# Patient Record
Sex: Male | Born: 1935 | Race: Black or African American | Hispanic: No | Marital: Married | Smoking: Never smoker
Health system: Southern US, Community
[De-identification: ages and names within clinical notes are randomized; demographics above are authoritative.]

## PROBLEM LIST (undated history)

## (undated) DIAGNOSIS — K219 Gastro-esophageal reflux disease without esophagitis: Secondary | ICD-10-CM

## (undated) DIAGNOSIS — R0602 Shortness of breath: Secondary | ICD-10-CM

## (undated) DIAGNOSIS — N186 End stage renal disease: Secondary | ICD-10-CM

## (undated) DIAGNOSIS — J45909 Unspecified asthma, uncomplicated: Secondary | ICD-10-CM

## (undated) DIAGNOSIS — J111 Influenza due to unidentified influenza virus with other respiratory manifestations: Secondary | ICD-10-CM

## (undated) DIAGNOSIS — H9190 Unspecified hearing loss, unspecified ear: Secondary | ICD-10-CM

## (undated) DIAGNOSIS — D649 Anemia, unspecified: Secondary | ICD-10-CM

## (undated) DIAGNOSIS — I10 Essential (primary) hypertension: Secondary | ICD-10-CM

## (undated) DIAGNOSIS — Z9289 Personal history of other medical treatment: Secondary | ICD-10-CM

## (undated) DIAGNOSIS — E785 Hyperlipidemia, unspecified: Secondary | ICD-10-CM

## (undated) DIAGNOSIS — F0390 Unspecified dementia without behavioral disturbance: Secondary | ICD-10-CM

## (undated) DIAGNOSIS — I252 Old myocardial infarction: Secondary | ICD-10-CM

## (undated) HISTORY — PX: LAPAROSCOPIC GASTROTOMY W/ REPAIR OF ULCER: SUR772

## (undated) HISTORY — DX: Old myocardial infarction: I25.2

## (undated) HISTORY — DX: Personal history of other medical treatment: Z92.89

## (undated) HISTORY — PX: COLONOSCOPY: SHX174

## (undated) HISTORY — DX: Essential (primary) hypertension: I10

## (undated) HISTORY — DX: Hyperlipidemia, unspecified: E78.5

## (undated) HISTORY — DX: End stage renal disease: N18.6

## (undated) HISTORY — PX: TOE AMPUTATION: SHX809

## (undated) HISTORY — DX: Anemia, unspecified: D64.9

## (undated) HISTORY — PX: EYE SURGERY: SHX253

---

## 2001-02-23 ENCOUNTER — Encounter: Admission: RE | Admit: 2001-02-23 | Discharge: 2001-02-23 | Payer: Self-pay | Admitting: *Deleted

## 2002-07-22 ENCOUNTER — Encounter: Admission: RE | Admit: 2002-07-22 | Discharge: 2002-07-22 | Payer: Self-pay | Admitting: Nephrology

## 2002-07-22 ENCOUNTER — Encounter: Payer: Self-pay | Admitting: Nephrology

## 2002-12-04 ENCOUNTER — Encounter: Payer: Self-pay | Admitting: Gastroenterology

## 2003-01-09 ENCOUNTER — Encounter: Payer: Self-pay | Admitting: Gastroenterology

## 2003-08-22 ENCOUNTER — Encounter (HOSPITAL_COMMUNITY): Admission: RE | Admit: 2003-08-22 | Discharge: 2003-11-20 | Payer: Self-pay | Admitting: Nephrology

## 2003-11-18 ENCOUNTER — Encounter: Admission: RE | Admit: 2003-11-18 | Discharge: 2003-11-18 | Payer: Self-pay | Admitting: Internal Medicine

## 2003-11-27 ENCOUNTER — Encounter (HOSPITAL_COMMUNITY): Admission: RE | Admit: 2003-11-27 | Discharge: 2004-02-25 | Payer: Self-pay | Admitting: Nephrology

## 2004-04-01 ENCOUNTER — Encounter (HOSPITAL_COMMUNITY): Admission: RE | Admit: 2004-04-01 | Discharge: 2004-06-30 | Payer: Self-pay | Admitting: Nephrology

## 2004-07-29 ENCOUNTER — Encounter (HOSPITAL_COMMUNITY): Admission: RE | Admit: 2004-07-29 | Discharge: 2004-10-27 | Payer: Self-pay | Admitting: Nephrology

## 2004-10-28 ENCOUNTER — Encounter (HOSPITAL_COMMUNITY): Admission: RE | Admit: 2004-10-28 | Discharge: 2005-01-26 | Payer: Self-pay | Admitting: Nephrology

## 2005-02-17 ENCOUNTER — Encounter (HOSPITAL_COMMUNITY): Admission: RE | Admit: 2005-02-17 | Discharge: 2005-05-18 | Payer: Self-pay | Admitting: Nephrology

## 2005-05-24 ENCOUNTER — Encounter (HOSPITAL_COMMUNITY): Admission: RE | Admit: 2005-05-24 | Discharge: 2005-08-22 | Payer: Self-pay | Admitting: Nephrology

## 2005-08-23 ENCOUNTER — Encounter (HOSPITAL_COMMUNITY): Admission: RE | Admit: 2005-08-23 | Discharge: 2005-11-21 | Payer: Self-pay | Admitting: Nephrology

## 2005-12-12 ENCOUNTER — Encounter (HOSPITAL_COMMUNITY): Admission: RE | Admit: 2005-12-12 | Discharge: 2006-03-12 | Payer: Self-pay | Admitting: Nephrology

## 2006-04-03 ENCOUNTER — Encounter (HOSPITAL_COMMUNITY): Admission: RE | Admit: 2006-04-03 | Discharge: 2006-06-07 | Payer: Self-pay | Admitting: Nephrology

## 2006-07-04 ENCOUNTER — Encounter (HOSPITAL_COMMUNITY): Admission: RE | Admit: 2006-07-04 | Discharge: 2006-10-02 | Payer: Self-pay | Admitting: Nephrology

## 2006-07-26 ENCOUNTER — Encounter (HOSPITAL_COMMUNITY): Admission: RE | Admit: 2006-07-26 | Discharge: 2006-10-24 | Payer: Self-pay | Admitting: Internal Medicine

## 2006-09-19 HISTORY — PX: AV FISTULA PLACEMENT: SHX1204

## 2006-11-29 ENCOUNTER — Ambulatory Visit: Payer: Self-pay | Admitting: Vascular Surgery

## 2007-02-22 ENCOUNTER — Encounter (HOSPITAL_COMMUNITY): Admission: RE | Admit: 2007-02-22 | Discharge: 2007-05-23 | Payer: Self-pay | Admitting: Nephrology

## 2007-04-15 ENCOUNTER — Inpatient Hospital Stay (HOSPITAL_COMMUNITY): Admission: EM | Admit: 2007-04-15 | Discharge: 2007-04-21 | Payer: Self-pay | Admitting: Emergency Medicine

## 2007-04-16 ENCOUNTER — Encounter: Payer: Self-pay | Admitting: Internal Medicine

## 2007-04-18 ENCOUNTER — Ambulatory Visit: Payer: Self-pay | Admitting: Vascular Surgery

## 2007-04-20 ENCOUNTER — Ambulatory Visit: Payer: Self-pay | Admitting: Internal Medicine

## 2007-04-25 ENCOUNTER — Ambulatory Visit (HOSPITAL_COMMUNITY): Admission: RE | Admit: 2007-04-25 | Discharge: 2007-04-25 | Payer: Self-pay | Admitting: Vascular Surgery

## 2007-05-24 ENCOUNTER — Encounter (HOSPITAL_COMMUNITY): Admission: RE | Admit: 2007-05-24 | Discharge: 2007-07-27 | Payer: Self-pay | Admitting: Nephrology

## 2007-06-05 ENCOUNTER — Ambulatory Visit: Payer: Self-pay | Admitting: Vascular Surgery

## 2007-08-30 ENCOUNTER — Encounter (HOSPITAL_COMMUNITY): Admission: RE | Admit: 2007-08-30 | Discharge: 2007-11-28 | Payer: Self-pay | Admitting: Nephrology

## 2007-12-20 ENCOUNTER — Encounter (HOSPITAL_COMMUNITY): Admission: RE | Admit: 2007-12-20 | Discharge: 2008-03-19 | Payer: Self-pay | Admitting: Nephrology

## 2008-03-20 ENCOUNTER — Encounter (HOSPITAL_COMMUNITY): Admission: RE | Admit: 2008-03-20 | Discharge: 2008-06-18 | Payer: Self-pay | Admitting: Nephrology

## 2008-06-30 ENCOUNTER — Encounter (HOSPITAL_COMMUNITY): Admission: RE | Admit: 2008-06-30 | Discharge: 2008-09-18 | Payer: Self-pay | Admitting: Nephrology

## 2008-09-25 ENCOUNTER — Encounter (HOSPITAL_COMMUNITY): Admission: RE | Admit: 2008-09-25 | Discharge: 2008-12-24 | Payer: Self-pay | Admitting: Pediatric Dentistry

## 2008-10-27 ENCOUNTER — Encounter: Payer: Self-pay | Admitting: Gastroenterology

## 2008-11-20 ENCOUNTER — Encounter: Payer: Self-pay | Admitting: Gastroenterology

## 2008-11-21 ENCOUNTER — Telehealth: Payer: Self-pay | Admitting: Gastroenterology

## 2008-11-25 ENCOUNTER — Ambulatory Visit: Payer: Self-pay | Admitting: Gastroenterology

## 2008-11-25 DIAGNOSIS — E119 Type 2 diabetes mellitus without complications: Secondary | ICD-10-CM

## 2008-11-25 DIAGNOSIS — Z860100 Personal history of colon polyps, unspecified: Secondary | ICD-10-CM | POA: Insufficient documentation

## 2008-11-25 DIAGNOSIS — R195 Other fecal abnormalities: Secondary | ICD-10-CM

## 2008-11-25 DIAGNOSIS — D649 Anemia, unspecified: Secondary | ICD-10-CM | POA: Insufficient documentation

## 2008-12-03 ENCOUNTER — Encounter: Payer: Self-pay | Admitting: Gastroenterology

## 2008-12-03 ENCOUNTER — Ambulatory Visit: Payer: Self-pay | Admitting: Gastroenterology

## 2008-12-03 ENCOUNTER — Ambulatory Visit (HOSPITAL_COMMUNITY): Admission: RE | Admit: 2008-12-03 | Discharge: 2008-12-03 | Payer: Self-pay | Admitting: Gastroenterology

## 2008-12-04 ENCOUNTER — Encounter: Payer: Self-pay | Admitting: Gastroenterology

## 2008-12-08 ENCOUNTER — Ambulatory Visit: Payer: Self-pay | Admitting: Gastroenterology

## 2008-12-08 DIAGNOSIS — K648 Other hemorrhoids: Secondary | ICD-10-CM | POA: Insufficient documentation

## 2008-12-08 DIAGNOSIS — D131 Benign neoplasm of stomach: Secondary | ICD-10-CM | POA: Insufficient documentation

## 2008-12-09 ENCOUNTER — Encounter: Payer: Self-pay | Admitting: Gastroenterology

## 2008-12-09 ENCOUNTER — Ambulatory Visit: Payer: Self-pay | Admitting: Gastroenterology

## 2008-12-10 ENCOUNTER — Encounter: Payer: Self-pay | Admitting: Gastroenterology

## 2008-12-25 ENCOUNTER — Encounter (HOSPITAL_COMMUNITY): Admission: RE | Admit: 2008-12-25 | Discharge: 2009-03-25 | Payer: Self-pay | Admitting: Nephrology

## 2009-01-02 ENCOUNTER — Ambulatory Visit: Payer: Self-pay | Admitting: Gastroenterology

## 2009-01-07 ENCOUNTER — Encounter: Payer: Self-pay | Admitting: Gastroenterology

## 2009-01-07 ENCOUNTER — Ambulatory Visit: Payer: Self-pay | Admitting: Gastroenterology

## 2009-01-09 ENCOUNTER — Encounter: Payer: Self-pay | Admitting: Gastroenterology

## 2009-01-14 ENCOUNTER — Ambulatory Visit (HOSPITAL_COMMUNITY): Admission: RE | Admit: 2009-01-14 | Discharge: 2009-01-14 | Payer: Self-pay | Admitting: Gastroenterology

## 2009-02-02 ENCOUNTER — Telehealth: Payer: Self-pay | Admitting: Gastroenterology

## 2009-02-20 ENCOUNTER — Telehealth: Payer: Self-pay | Admitting: Gastroenterology

## 2009-05-27 ENCOUNTER — Emergency Department (HOSPITAL_COMMUNITY): Admission: EM | Admit: 2009-05-27 | Discharge: 2009-05-27 | Payer: Self-pay | Admitting: Emergency Medicine

## 2009-06-12 IMAGING — CR DG CHEST 2V
2 series · 2 of 2 positions shown · non-contrast
Comparison: none

CLINICAL DATA: Preoperative for renal disease.  Asthma, hypertension, nonsmoker. 
CHEST - 2 VIEW:
TECHNIQUE: PA and lateral chest.

[view not recorded (1 of 2)]
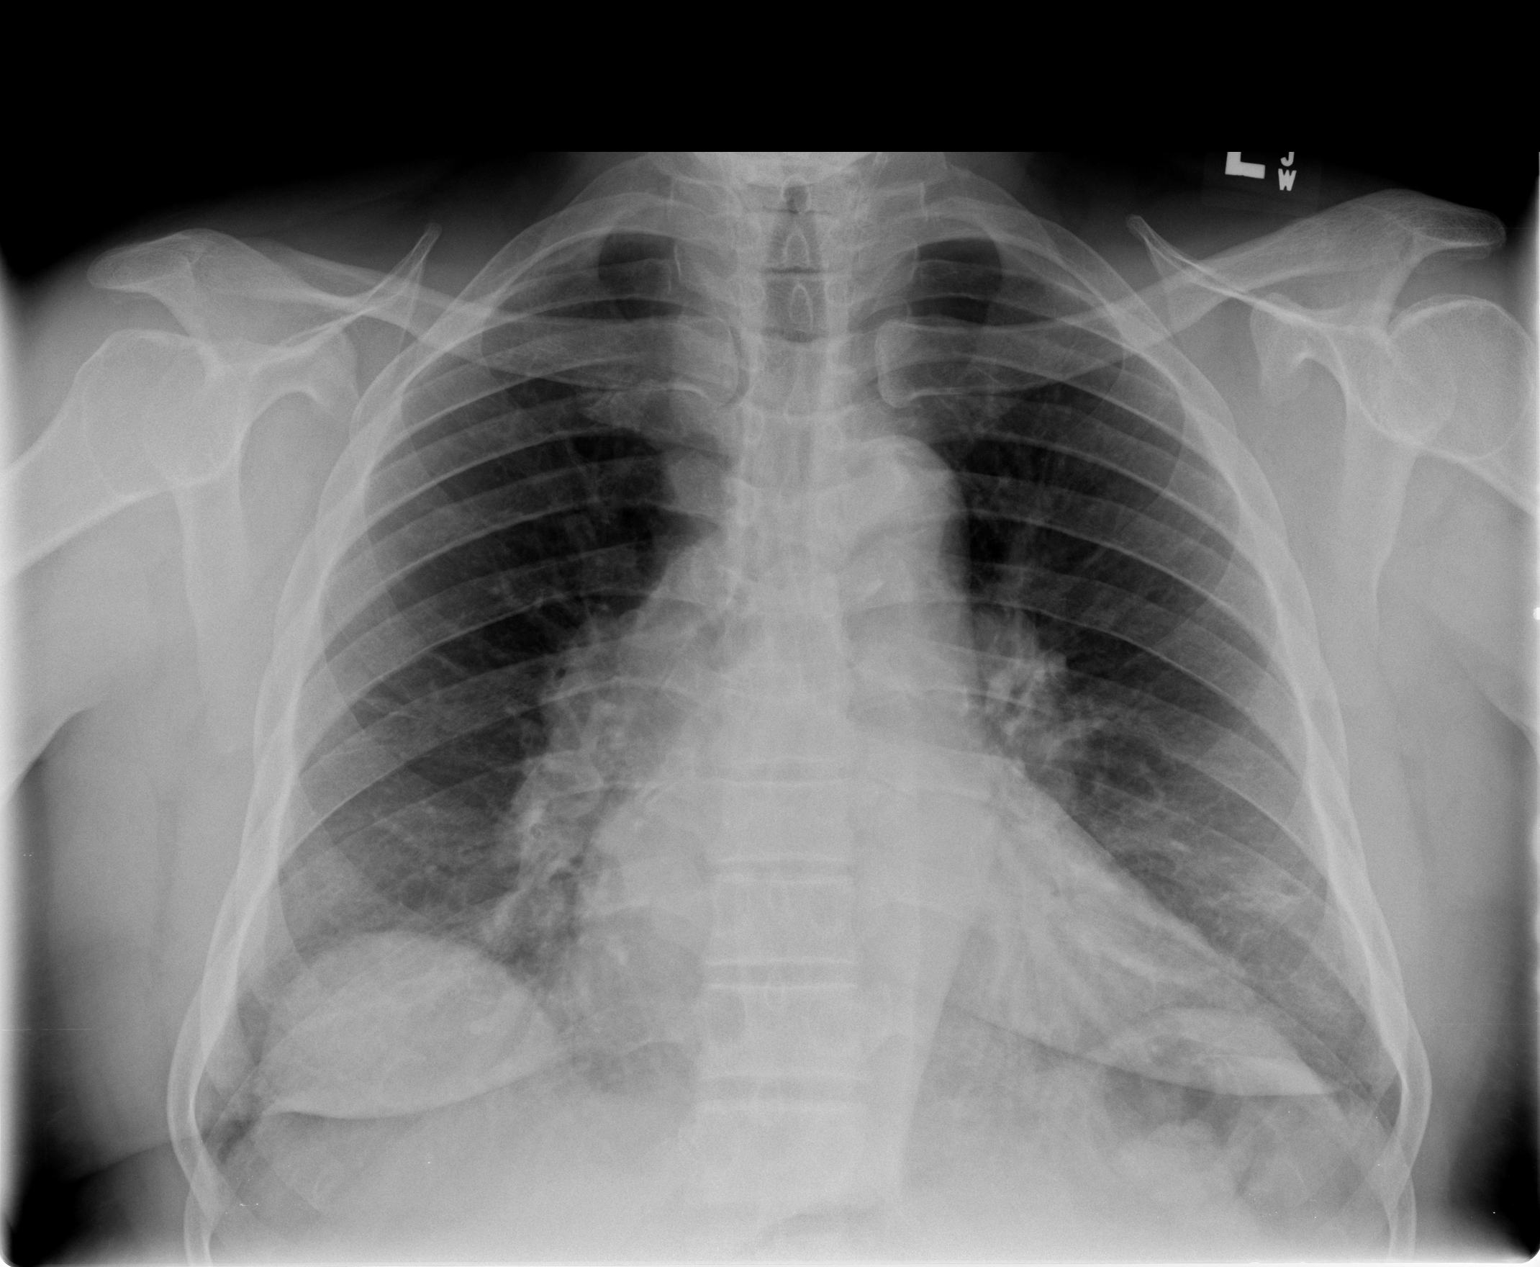

[view not recorded (2 of 2)]
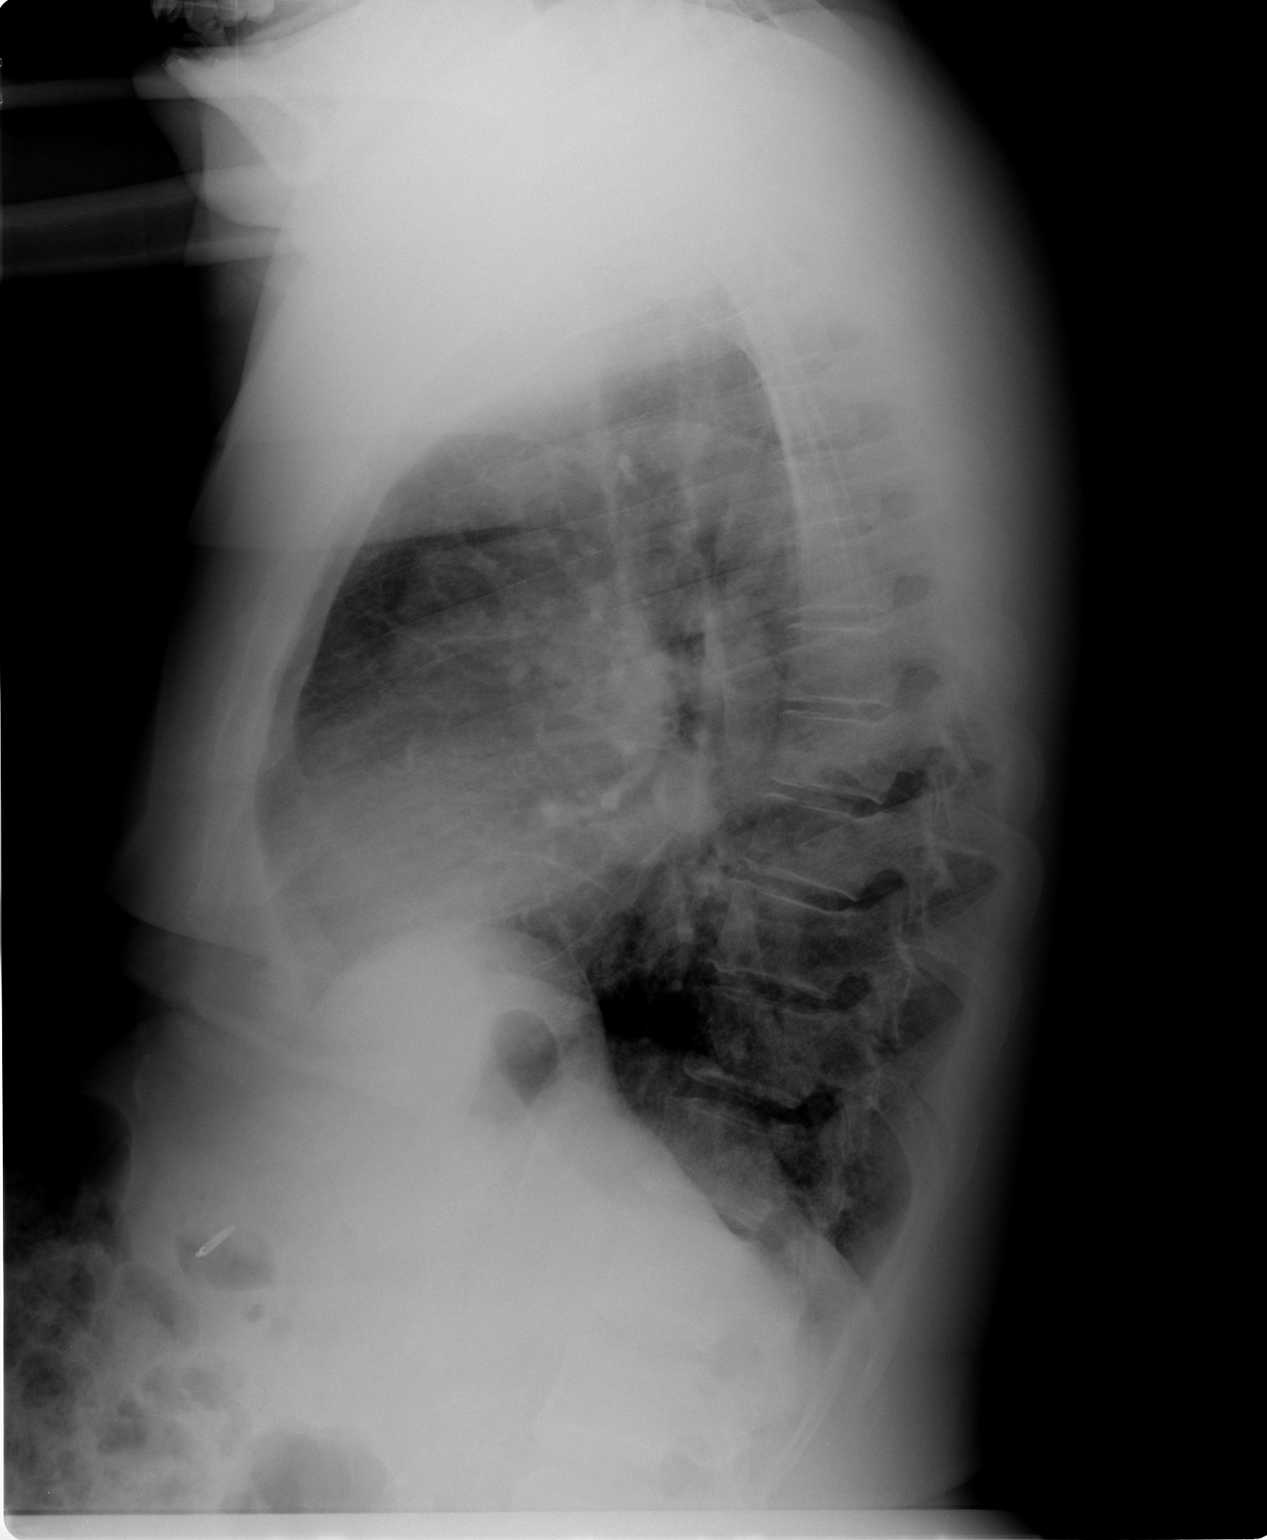

[2 of 2 positions shown; findings below may reference images not displayed]

FINDINGS: Cardio-pericardial silhouette is mildly enlarged, even while allowing for projection.  There is also prominence of the pulmonary arteries bilaterally, suggesting pulmonary hypertension.  Eventration of the right hemidiaphragm.  Scattered areas of subsegmental atelectasis.  No edema or airspace disease.  No pleural effusions.  Aortic atherosclerosis.
IMPRESSION: 1.  Mild cardiomegaly. 
2.  Prominence of pulmonary arteries suggests pulmonary hypertension.
3.  Bibasilar atelectasis.

## 2009-07-17 ENCOUNTER — Ambulatory Visit (HOSPITAL_COMMUNITY): Admission: RE | Admit: 2009-07-17 | Discharge: 2009-07-17 | Payer: Self-pay | Admitting: Nephrology

## 2009-08-18 ENCOUNTER — Ambulatory Visit (HOSPITAL_COMMUNITY): Admission: RE | Admit: 2009-08-18 | Discharge: 2009-08-18 | Payer: Self-pay | Admitting: Nephrology

## 2009-08-18 ENCOUNTER — Encounter (INDEPENDENT_AMBULATORY_CARE_PROVIDER_SITE_OTHER): Payer: Self-pay | Admitting: Nephrology

## 2009-12-03 ENCOUNTER — Ambulatory Visit (HOSPITAL_COMMUNITY): Admission: RE | Admit: 2009-12-03 | Discharge: 2009-12-03 | Payer: Self-pay | Admitting: Nephrology

## 2009-12-30 ENCOUNTER — Encounter (INDEPENDENT_AMBULATORY_CARE_PROVIDER_SITE_OTHER): Payer: Self-pay | Admitting: *Deleted

## 2010-06-03 ENCOUNTER — Ambulatory Visit (HOSPITAL_COMMUNITY): Admission: RE | Admit: 2010-06-03 | Discharge: 2010-06-03 | Payer: Self-pay | Admitting: Nephrology

## 2010-07-22 ENCOUNTER — Ambulatory Visit (HOSPITAL_COMMUNITY)
Admission: RE | Admit: 2010-07-22 | Discharge: 2010-07-22 | Payer: Self-pay | Source: Home / Self Care | Admitting: Nephrology

## 2010-10-14 ENCOUNTER — Ambulatory Visit (HOSPITAL_COMMUNITY)
Admission: RE | Admit: 2010-10-14 | Discharge: 2010-10-14 | Payer: Self-pay | Source: Home / Self Care | Attending: Nephrology | Admitting: Nephrology

## 2010-10-19 NOTE — Letter (Signed)
Summary: Endoscopy- Changed to Office Visit  [facility]  [address] [place], [place] [address]   Phone: [phone]  Fax: [phone]      December 30, 2009 MRN: [id number]   Steven Forbes [address] [place], [place]  [address]   Dear Mr. JABLONSKY,   According to our records, it is time for you to schedule an Endoscopy and Colonoscopy. However, after reviewing your medical record, I feel that an office visit would be most appropriate to more completely evaluate you and determine your need for a repeat procedures.  Please call [phone] (option #2) at your convenience to schedule an office visit. If you have any questions, concerns, or feel that this letter is in error, we would appreciate your call.   Sincerely,  Judie Petit T. Russella Dar, M.D.  [facility] [phone]

## 2010-12-24 LAB — DIFFERENTIAL
Basophils Absolute: 0 10*3/uL (ref 0.0–0.1)
Basophils Relative: 0 % (ref 0–1)
Lymphocytes Relative: 7 % — ABNORMAL LOW (ref 12–46)
Monocytes Absolute: 0.3 10*3/uL (ref 0.1–1.0)
Monocytes Relative: 3 % (ref 3–12)
Neutro Abs: 11.3 10*3/uL — ABNORMAL HIGH (ref 1.7–7.7)
Neutrophils Relative %: 89 % — ABNORMAL HIGH (ref 43–77)

## 2010-12-24 LAB — CBC
Hemoglobin: 14.3 g/dL (ref 13.0–17.0)
MCHC: 31.8 g/dL (ref 30.0–36.0)
RBC: 4.92 MIL/uL (ref 4.22–5.81)
RDW: 19.3 % — ABNORMAL HIGH (ref 11.5–15.5)

## 2010-12-24 LAB — BASIC METABOLIC PANEL
Calcium: 9.4 mg/dL (ref 8.4–10.5)
Creatinine, Ser: 5.85 mg/dL — ABNORMAL HIGH (ref 0.4–1.5)
GFR calc Af Amer: 11 mL/min — ABNORMAL LOW (ref >60)
GFR calc non Af Amer: 9 mL/min — ABNORMAL LOW (ref >60)
Sodium: 139 mEq/L (ref 135–145)

## 2010-12-27 LAB — RENAL FUNCTION PANEL
Albumin: 2.8 g/dL — ABNORMAL LOW (ref 3.5–5.2)
BUN: 109 mg/dL — ABNORMAL HIGH (ref 6–23)
CO2: 20 mEq/L (ref 19–32)
Chloride: 90 mEq/L — ABNORMAL LOW (ref 96–112)
Creatinine, Ser: 21.97 mg/dL — ABNORMAL HIGH (ref 0.4–1.5)
Glucose, Bld: 256 mg/dL — ABNORMAL HIGH (ref 70–99)
Potassium: 4.4 mEq/L (ref 3.5–5.1)

## 2010-12-27 LAB — IRON AND TIBC
Saturation Ratios: 15 % — ABNORMAL LOW (ref 20–55)
TIBC: 241 ug/dL (ref 215–435)
UIBC: 206 ug/dL

## 2010-12-27 LAB — PTH, INTACT AND CALCIUM: Calcium, Total (PTH): 8.4 mg/dL (ref 8.4–10.5)

## 2010-12-28 LAB — POCT HEMOGLOBIN-HEMACUE: Hemoglobin: 11.8 g/dL — ABNORMAL LOW (ref 13.0–17.0)

## 2010-12-28 LAB — RENAL FUNCTION PANEL
BUN: 114 mg/dL — ABNORMAL HIGH (ref 6–23)
CO2: 24 mEq/L (ref 19–32)
Chloride: 95 mEq/L — ABNORMAL LOW (ref 96–112)
Glucose, Bld: 160 mg/dL — ABNORMAL HIGH (ref 70–99)
Phosphorus: 8.9 mg/dL — ABNORMAL HIGH (ref 2.3–4.6)
Potassium: 4 mEq/L (ref 3.5–5.1)
Sodium: 137 mEq/L (ref 135–145)

## 2010-12-28 LAB — PTH, INTACT AND CALCIUM: PTH: 101.9 pg/mL — ABNORMAL HIGH (ref 14.0–72.0)

## 2010-12-29 LAB — POCT HEMOGLOBIN-HEMACUE
Hemoglobin: 11.3 g/dL — ABNORMAL LOW (ref 13.0–17.0)
Hemoglobin: 12.6 g/dL — ABNORMAL LOW (ref 13.0–17.0)
Hemoglobin: 11.1 g/dL — ABNORMAL LOW (ref 13.0–17.0)

## 2010-12-29 LAB — RENAL FUNCTION PANEL
Albumin: 3.1 g/dL — ABNORMAL LOW (ref 3.5–5.2)
BUN: 93 mg/dL — ABNORMAL HIGH (ref 6–23)
Chloride: 99 mEq/L (ref 96–112)
GFR calc Af Amer: 3 mL/min — ABNORMAL LOW (ref >60)
GFR calc non Af Amer: 2 mL/min — ABNORMAL LOW (ref >60)
Phosphorus: 7.9 mg/dL — ABNORMAL HIGH (ref 2.3–4.6)
Potassium: 3.6 mEq/L (ref 3.5–5.1)
Sodium: 139 mEq/L (ref 135–145)

## 2010-12-29 LAB — PTH, INTACT AND CALCIUM
Calcium, Total (PTH): 9 mg/dL (ref 8.4–10.5)
PTH: 51.5 pg/mL (ref 14.0–72.0)

## 2010-12-29 LAB — HEPATITIS B SURFACE ANTIGEN: Hepatitis B Surface Ag: NEGATIVE

## 2010-12-29 LAB — GLUCOSE, CAPILLARY
Glucose-Capillary: 171 mg/dL — ABNORMAL HIGH (ref 70–99)
Glucose-Capillary: 63 mg/dL — ABNORMAL LOW (ref 70–99)
Glucose-Capillary: 83 mg/dL (ref 70–99)

## 2010-12-30 LAB — RENAL FUNCTION PANEL
Albumin: 3.2 g/dL — ABNORMAL LOW (ref 3.5–5.2)
BUN: 107 mg/dL — ABNORMAL HIGH (ref 6–23)
Chloride: 94 mEq/L — ABNORMAL LOW (ref 96–112)
Creatinine, Ser: 19.2 mg/dL — ABNORMAL HIGH (ref 0.4–1.5)
Glucose, Bld: 180 mg/dL — ABNORMAL HIGH (ref 70–99)
Potassium: 3.9 mEq/L (ref 3.5–5.1)

## 2010-12-30 LAB — POCT HEMOGLOBIN-HEMACUE: Hemoglobin: 8.8 g/dL — ABNORMAL LOW (ref 13.0–17.0)

## 2010-12-30 LAB — PTH, INTACT AND CALCIUM
Calcium, Total (PTH): 10.2 mg/dL (ref 8.4–10.5)
PTH: 21.9 pg/mL (ref 14.0–72.0)

## 2010-12-30 LAB — IRON AND TIBC
Saturation Ratios: 18 % — ABNORMAL LOW (ref 20–55)
TIBC: 271 ug/dL (ref 215–435)
UIBC: 223 ug/dL

## 2010-12-30 LAB — GLUCOSE, CAPILLARY: Glucose-Capillary: 140 mg/dL — ABNORMAL HIGH (ref 70–99)

## 2011-01-03 LAB — POCT HEMOGLOBIN-HEMACUE: Hemoglobin: 9.9 g/dL — ABNORMAL LOW (ref 13.0–17.0)

## 2011-01-03 LAB — IRON AND TIBC
Iron: 105 ug/dL (ref 42–135)
Saturation Ratios: 40 % (ref 20–55)
UIBC: 156 ug/dL

## 2011-01-03 LAB — RENAL FUNCTION PANEL
Albumin: 3.1 g/dL — ABNORMAL LOW (ref 3.5–5.2)
Calcium: 9.2 mg/dL (ref 8.4–10.5)
GFR calc Af Amer: 4 mL/min — ABNORMAL LOW (ref >60)
GFR calc non Af Amer: 3 mL/min — ABNORMAL LOW (ref >60)
Phosphorus: 5.7 mg/dL — ABNORMAL HIGH (ref 2.3–4.6)
Potassium: 3.5 mEq/L (ref 3.5–5.1)
Sodium: 140 mEq/L (ref 135–145)

## 2011-01-03 LAB — FERRITIN: Ferritin: 287 ng/mL (ref 22–322)

## 2011-01-03 LAB — PTH, INTACT AND CALCIUM: PTH: 109.8 pg/mL — ABNORMAL HIGH (ref 14.0–72.0)

## 2011-01-04 LAB — HEPATITIS B SURFACE ANTIGEN: Hepatitis B Surface Ag: NEGATIVE

## 2011-01-04 LAB — RENAL FUNCTION PANEL
Albumin: 3.3 g/dL — ABNORMAL LOW (ref 3.5–5.2)
CO2: 26 mEq/L (ref 19–32)
Chloride: 93 mEq/L — ABNORMAL LOW (ref 96–112)
Creatinine, Ser: 18.96 mg/dL — ABNORMAL HIGH (ref 0.4–1.5)
GFR calc Af Amer: 3 mL/min — ABNORMAL LOW (ref >60)
GFR calc non Af Amer: 2 mL/min — ABNORMAL LOW (ref >60)
Potassium: 3.7 mEq/L (ref 3.5–5.1)
Sodium: 139 mEq/L (ref 135–145)

## 2011-01-04 LAB — POCT HEMOGLOBIN-HEMACUE
Hemoglobin: 9 g/dL — ABNORMAL LOW (ref 13.0–17.0)
Hemoglobin: 8 g/dL — ABNORMAL LOW (ref 13.0–17.0)

## 2011-01-04 LAB — IRON AND TIBC: Iron: 99 ug/dL (ref 42–135)

## 2011-01-04 LAB — PTH, INTACT AND CALCIUM: Calcium, Total (PTH): 10.5 mg/dL (ref 8.4–10.5)

## 2011-02-01 NOTE — H&P (Signed)
NAMEBRITTAN, Forbes NO.:  [id number]   MEDICAL RECORD NO.:  [id number]          PATIENT TYPE:  EMS   LOCATION:  MAJO                         FACILITY:  [facility]   PHYSICIAN:  Tera Mater. Evlyn Kanner, M.D. DATE OF BIRTH:  02-Feb-1926   DATE OF ADMISSION:  04/15/2007  DATE OF DISCHARGE:                              HISTORY & PHYSICAL   Mr. Steven Forbes is a 77 year old __________  Forbes history of diabetes,  hypertension, progressive nephropathy, who presents with a weakness for  3 or 4 days.  He started becoming ill after dinner, earlier and middle  of last week.  He had one episode of vomiting but poor p.o. intake.  He  has had one loose stools, and bowels worked well before then.  He was  too weak to go to church today and spent most of the day in bed.  He has  had some fluid intake but no solid food intake of note for 2 or 3 days  at least.  He denies any headaches but has some dizziness when up.  He  has had no abdominal pain and has seen no bleeding.  He has had no  fevers, chills or sweats.  He has had no chest pain, shortness of  breath, orthopnea or PND.  His vision has been generally stable.  He has  had no flank pain or rigors.  He has no sensation that is bladder is not  emptying.   PAST MEDICAL HISTORY:  Includes type 2 diabetes for several years  treated with insulin, history of nephropathy with a creatinine on July  10 of 5.62, history of mild secondary hyperparathyroidism, hypertension,  hyperlipidemia.  He has never been hospitalized overnight.  He has had  no coronary disease, no cancers, no surgeries.   SOCIAL HISTORY:  Reveals he is married with Steven children, 5 grandchildren,  5 great grandchildren.   MEDICATIONS:  Include:  1. Caduet 1040.  2. Lasix 80 twice daily.  3. Potassium 20 mEq.  4. Labetalol 300 three times a day.  5. Zemplar 1 mcg daily.  Steven. NPH in uncertain dose.  7. Vasotec 20 twice daily.  8. Cozaar 100 once daily.  9. Iron.  10.Baby  aspirin.  11.Vitamin C.   ALLERGIES:  LISTED AS NONE.   EXAM:  VITAL SIGNS:  Blood pressure is 118/63, pulse is 80 and what  appears to be a sinus rhythm, respiration rate is 20, temperature 98.2,  O2 sats 100%.  GENERAL:  We have an elderly black Forbes lying quietly on the stretcher,  nearly flat, in no distress.  HEENT:  Sclerae are somewhat muddy but anicteric.  Extraocular movements  are intact with no nystagmus.  Face is generally symmetric.  Oral mucous  membranes are relatively moist, still dentures are in place, upper and  lower.  No oropharyngeal lesions are present.  NECK:  Supple without JVD, bruits or thyromegaly.  LUNGS:  Clear without wheezes, rales or rhonchi.  No accessory muscles  in use.  HEART:  Regular and distant.  I cannot appreciate any murmurs.  ABDOMEN:  Soft, minimally distended  with hypoactive bowel sounds.  No  bladder could be felt.  No pulsations could be felt.  No masses or  organomegaly is present.  EXTREMITIES:  Revealed pulses to be slightly diminished with no  peripheral edema.  SKIN:  Skin turgor fairly poor with some tenting.  NEURO:  The patient is awake, alert.  His speech appears clear.  He is  able to furnish most of the data himself for most of the questions, no  resting tremors present.  No asterixis is present.   LAB DATA:  Sodium was 127, potassium 4.4, chloride 94, BUN is in excess  of 140, creatinine is 8.2, glucose is 185.   SUMMARY:  We have a Steven Forbes with known diabetic  nephropathy with already fairly advanced renal failure, now in full  renal failure.  He has some element of dehydration, but we do worry  about a obstructive process as well.  He is probably on his way to  dialysis, both during this hospitalization and long-term.  We will  gently hydrate him; however, and discuss dialysis issues with him.  Hemoglobin was 15.2 just couple weeks ago, and the CBC was checked in  the morning.  Given his renal  dysfunction, we are going to hold his  Cozaar, Vasotec, Lasix, Caduet and give low-dose labetalol at a much  lower dose than his usual.  He will be given a sliding scale NovoLog and  Lantus.  He will be given some normal saline.  A Foley will be placed  in, to rule out any bladder neck obstruction.  Renal is to see him  tomorrow.           ______________________________  Tera Mater Evlyn Kanner, M.D.     SAS/MEDQ  D:  04/15/2007  T:  04/16/2007  Job:  [id number]

## 2011-02-01 NOTE — Op Note (Signed)
NAMEMAGUIRE, SIME NO.:  [id number]   MEDICAL RECORD NO.:  [id number]          PATIENT TYPE:  AMB   LOCATION:  SDS                          FACILITY:  [facility]   PHYSICIAN:  Di Kindle. Edilia Bo, M.D.DATE OF BIRTH:  08-14-1926   DATE OF PROCEDURE:  04/25/2007  DATE OF DISCHARGE:  04/25/2007                               OPERATIVE REPORT   PREOPERATIVE DIAGNOSIS:  Chronic renal failure.   POSTOPERATIVE DIAGNOSIS:  Chronic renal failure.   PROCEDURE:  Placement new left upper arm arteriovenous fistula.   SURGEON:  Di Kindle. Edilia Bo, M.D.   ASSISTANT:  Zenaida Niece, RNFA   ANESTHESIA:  Local with sedation.   TECHNIQUE:  The patient was taken to the operating room and sedated by  anesthesia.  The left upper extremity was prepped and draped in the  usual sterile fashion.  After the skin was infiltrated with 1%  lidocaine, a transverse incision was made at the antecubital level.  The  cephalic vein was dissected free and was a reasonable size vein.  The  brachial artery was dissected free beneath the fascia. The patient was  heparinized.  The artery was clamped proximally and distally and a  longitudinal arteriotomy was made.  The vein was mobilized and oversewn  end-to-side to the artery using continuous 6-0 Prolene suture.  At the  end of the cas,e there was an excellent thrill in the fistula.  Hemostasis was obtained in the wound.  The wound was closed with a deep  layer of 3-0 Vicryl and the skin closed with 4-0 Vicryl.  A sterile  dressing was applied.  The patient tolerated the procedure well and was  transferred to the recovery room in satisfactory condition.  All needle  and sponge counts were correct.      Di Kindle. Edilia Bo, M.D.  Electronically Signed     CSD/MEDQ  D:  04/25/2007  T:  04/25/2007  Job:  [id number]

## 2011-02-01 NOTE — Discharge Summary (Signed)
Steven Forbes, Steven Forbes NO.:  [id number]   MEDICAL RECORD NO.:  [id number]          PATIENT TYPE:  INP   LOCATION:  5532                         FACILITY:  [facility]   PHYSICIAN:  Gwen Pounds, MD       DATE OF BIRTH:  September 03, 1926   DATE OF ADMISSION:  04/15/2007  DATE OF DISCHARGE:  04/21/2007                               DISCHARGE SUMMARY   GASTROENTEROLOGIST:  Hedwig Morton. Juanda Chance, M.D.   NEPHROLOGIST:  Cecille Aver, M.D.   VASCULAR SURGERY:  Di Kindle. Edilia Bo, M.D.   DISCHARGE DIAGNOSES:  1. Upper gastrointestinal bleed secondary to gastric ulcer.  2. Severe symptomatic anemia secondary to blood loss.  3. Acute on chronic renal failure.  4. Chronic anemia.  5. Hypertension.  6. Diabetes mellitus type 2.  7. Hyperlipidemia.  8. Physical deconditioning.  9. Acute hypotension secondary to upper gastrointestinal bleed and      symptomatic anemia, resolved after treatment.  10.Secondary hyperparathyroidism.   PROCEDURES:  1. Packed red blood cell transfusion.  2. Upper endoscopy per Dr. Juanda Chance revealing the gastric ulcer, acute      with hemorrhage actively oozing.  Dr. Juanda Chance performed hemostasis      with epinephrine and an endoscopy clip with a comment that he      should continue on Protonix twice a day starting now.   CONSULTATIONS:  All above mentioned physicians.   HISTORY OF PRESENT ILLNESS:  Briefly, Steven Forbes is a long term  patient of mine who is 77 years old with multiple medical problems  including diabetes insulin requiring who presented to medical attention  on April 15, 2007 with weakness for three or four days, weakness  associated with underlying illness with nausea and vomiting, loose  stools, fatigue, poor p.o. intake.  He denied any chest pain or  shortness of breath.  In the ER, he was evaluated and Dr. Evlyn Kanner was  called in for his inpatient admission.  Blood pressure at that time was  118/63, pulse 80, oxygen  saturation 100.  He did not appear to be in  distress.  He just appeared to be fatigued.  Laboratory data showed  sodium was 127, potassium 4.4, chloride 94, BUN 140, creatinine 8.2.  His blood counts were not available when Dr. Evlyn Kanner was admitting him.  Dr. Evlyn Kanner admitted him for worsening renal failure, hyponatremia, and  dehydration.  At this point in time it was felt that he was probably on  his way to dialysis and that his kidneys had just failed him with  potentially over diuresis.  His note reports hemoglobin was 15.2 just a  couple of weeks ago where Dr. Kathrene Bongo has been giving him Aranesp  to keep his hemoglobin up.  His Cozaar, Vasotec, Lasix, Caduet were put  on hold and labetalol was backed off.  He was given sliding scale  insulin and Lantus and placed on low dose normal saline.  The plan was  to do a bladder scan to rule out obstruction and nephrology was called  in to see the patient.   DISCHARGE MEDICATIONS:  1. Humulin N 10 units b.i.d. as before.  May need to increase if blood      sugars are running higher.  2. Nu-Iron 150 mg p.o. daily.  3. Zemplar 2 mcg p.o. daily.  4. Protonix 40 mg p.o. b.i.d. for 1 month then 1 time per day for the      rest of his life.  5. Zymar eye drops 1 drop each eye 3 times per day as instructed.  6. Amoxil 500 mg p.o. q.24h. x7 days.  7. Lipitor 40 mg p.o. daily to replace Caduet.  8. Normodyne 100 mg p.o. t.i.d.  9. Biaxin 500 mg p.o. daily x7 days.  10.Tums 3 tablets p.o. t.i.d.  11.Multivitamin 1 p.o. daily.   If he decreases or increases his lower extremity edema, he is to call  either Dr. Kathrene Bongo or myself for further instructions and until  further notice he is to hold his Cozaar, Lasix, K-Dur, Norvasc, Caduet,  enalapril and he is to discontinue aspirin indefinitely at this point.   HOSPITAL COURSE:  Steven Forbes is a 77 year old man with multiple  medical problems admitted through the emergency department  with four  days of nausea, vomiting, anorexia, weakness and failure to thrive.  Laboratory data revealed worsening azotemia.  He got fluids overnight  without any result in shortness of breath or chest pain.  The patient  reported no loss of blood, no bright red blood per rectum and no melena.  Vital signs were stable on April 16, 2007.  He was just weak appearing  and much less jovial than usual.  Physical examination was otherwise  unremarkable.  On laboratory data, his BUN had risen to 191 and  creatinine was 8.26 and hemoglobin was 5.7.  This did not make sense  although when reviewing the I-STAT labs done the night before, his  hemoglobin was 7.1 and again now on CBC hemoglobin was 5.7.  I went  ahead and typed and crossed him and sent the CBC STAT and felt that he  needed three units of packed red blood cells.  The thought was then  maybe his underlying dehydration was secondary to GI bleed.  I went  ahead and gave GI a call.  At the same time, I was getting calls from  the floor at around 10:30 in the morning on April 16, 2007 where his  blood pressure had started dropping.  He started getting fluid boluses  and the nurse also reported that he was having black tarry stools.  GI  came and saw him pretty quickly and got him quickly scoped with  endoscopy which showed a gastric ulcer.  The gastric ulcer was  epinephrine injected and banded and he was moved to the ICU for further  evaluation and treatment.  In the ICU, he received several units of  blood, an NG-tube was placed, IV fluids were continued, H. pylori by  biopsy was checked and was positive.  He was eventually put on triple  therapy for the H. pylori.  He was continued on Protonix IV and this was  changed to orally as he improved.  Over the next couple of days, we  watched his hemoglobin trend back up as he was getting blood and  stabilized.  His white count also improved and the rest of his labs  slowly started improving.   His worst BUN and creatinine were 223 and 8.7  respectively.  This whole time, renal was following along.  His blood  counts remained stable, but on the low side, so he eventually got two  more units.  Eventually NG-tube was pulled and he was allowed to have  full liquids and slowly advanced his diet.  After his blood count  improved and his bleeding had stabilized, his blood pressures also  improved.  There is a question as to whether we are going to repeat the  EGD or just follow the hemoglobin.  It was determined that we were just  going to follow his hemoglobin and if they trended down then potentially  rescope him.  Fortunately that did not happen.  On April 21, 2007 he was  seen and evaluated by me.  He had no complaints.  He was doing well.  He  was back in his jovial normal spirits.  All his vital signs were stable.  Blood pressure was 135/65.  His blood sugars ranged from 132 up to 297.  His diabetes medications had been adjusted slightly.  Physical exam was  otherwise unremarkable.  Sodium was 144, potassium 3.6, chloride 112,  bicarb 24, BUN 149, creatinine 7.39 and glucose was 107.  Hemoglobin was  steady at 9.3.  Although that value was not back at the time I saw him  that morning, it was determined that if his hemoglobin had remained  stable it was okay for him to be discharged to home with continued  outpatient close followup.  For his GI, he is going to go home and  continue his proton pump inhibition and followup with accordingly.  For  his chronic anemia which has now stabilized, he will go back on Aranesp  per Dr. Kathrene Bongo.  For his hypertension, his blood pressure was  stable and I imagine as he gets back on with his regular life, he will  have further needs for increasing blood pressure medication.  During the  hospital stay, Dr. Edilia Bo saw the patient and scheduled him for an  outpatient AV fistula because he will eventually need dialysis.   Despite the very  high elevation of his BUN and creatinine, he never  needed dialysis and never was acidotic, volume overloaded or had any  acute needs.  This will be further evaluated as an outpatient and  hopefully his creatinine will return to his baseline.      Gwen Pounds, MD  Electronically Signed     JMR/MEDQ  D:  05/22/2007  T:  05/22/2007  Job:  [id number]   cc:   Hedwig Morton. Juanda Chance, MD  Cecille Aver, M.D.  Di Kindle. Edilia Bo, M.D.

## 2011-02-01 NOTE — Consult Note (Signed)
Steven Forbes, Steven Forbes NO.:  [id number]   MEDICAL RECORD NO.:  [id number]          PATIENT TYPE:  INP   LOCATION:  5528                         FACILITY:  [facility]   PHYSICIAN:  Dyke Maes, M.D.DATE OF BIRTH:  [birth year]   DATE OF CONSULTATION:  04/16/2007  DATE OF DISCHARGE:                                 CONSULTATION   REFERRING PHYSICIAN:  Dr. Creola Corn.   REASON FOR CONSULTATION:  Is acute on chronic renal insufficiency.   HISTORY OF PRESENT ILLNESS:  A 77 year old black male with history of  chronic kidney disease secondary to diabetes and hypertension who  complaints of nausea and vomiting on Thursday followed by decreased  appetite since then.  In addition, he had black stools for at least the  last 2 days.  Overall, the patient is not a very good historian.  In the  emergency room, he is found to have a BUN of 191 and creatinine of 8.2.  His baseline serum creatinines in the 4 to 5 range.  Also of note is the  fact that his hemoglobin was 15 and has dropped to 5.  He is followed by  Dr. Jon Gills office, and she has discussed getting him ready for  hemodialysis.  However, he has been reluctant to get an access placed.  He has seen the vascular surgeons but still has refused to have access  placed.  His systolic blood pressure did drop into the 80s to 90s last  night, though it did better.  He is on Cozaar and enalapril.  He denies  any hematemesis.   PAST MEDICAL HISTORY:  1. Significant for hypertension for at least 20 years.  2. Diabetes for close to 10 years.  3. Glaucoma.  4. Hypercholesterolemia.  5. Secondary hyperparathyroidism.   MEDICATIONS INCLUDE:  1. Cozaar 100 mg a day.  2. Lasix 80 mg b.i.d.  3. Enteric-coated aspirin 325 mg a day.  4. Multivitamin 1 a day.  5. Potassium chloride 20 mEq a day.  6. Enalapril 20 mg a day.  7. Humulin N 12 units b.i.d.  8. Labetalol 300 mg t.i.d.  9. Albuterol p.r.n.  10.Caduet 10/40  mg 1 a day.  11.Zemplar 2 mcg a day.  12.Ferrous sulfate 325 mg 2 a day.   SOCIAL HISTORY:  Denies alcohol use.  He is currently a nonsmoker.  He  is married and lives with his wife in [place].   FAMILY HISTORY:  Is negative for renal disease.  Positive for  hypertension and diabetes.   REVIEW OF SYSTEMS:  Appetite poor, as noted above.  Denies any shortness  of breath.  No abdominal pain.  Change in bowel habits as noted above.  No new neuropathic symptoms.  No new arthritic complaints.  No edema.  No dysuria.  No recent change in vision.  The rest of the review of  systems unremarkable.   PHYSICAL EXAMINATION:  Blood pressure 123/58, temperature 99, pulse 91.  In general, a 77 year old black male in no acute distress.  HEENT:  Sclera nonicteric.  Extraocular muscles are intact.  NECK:  Reveals no  JVD.  LUNGS:  Clear to auscultation.  HEART:  Regular rate and rhythm without murmur, rub or gallop.  ABDOMEN:  Positive bowel sounds, nontender, nondistended.  No  hepatosplenomegaly.  EXTREMITIES:  No edema.  NEUROLOGIC EXAM:  Cranial nerves intact.  Motor and sensory intact.  No  asterixis.   LABORATORY:  Sodium 130, potassium 3.1, bicarb 21, BUN 191, creatinine  8.2, hemoglobin 5.0.   IMPRESSION:  1. Chronic kidney disease stage IV with worsening renal function in      the setting of acute gastrointestinal bleed, hypotension on an ACE      and ARB.  2. Gastrointestinal bleed, upper versus lower.  3. Diabetes.  4. Hypertension.  5. Secondary hyperparathyroidism.   RECOMMENDATIONS:  1. I agree with transfusion of packed red blood cells.  2. Will follow his renal function after transfusion and off the ACE      and ARB and see if it improves.  I have discussed with the patient,      his wife and daughter the possibility of hemodialysis if renal      function does not improve.  I think he will agree to hemodialysis      if needed.  Even if he does not need dialysis, he  certainly needs      placement of AV fistula and AV graft prior to discharge.  3. GI consult which has been done.  4. Will hold his ACE and ARB and other blood pressure medicines.  5. Will follow his renal function daily.   Thank you very much for the consult.  I will follow the patient with  you.           ______________________________  Dyke Maes, M.D.     MTM/MEDQ  D:  04/16/2007  T:  04/17/2007  Job:  [id number]

## 2011-02-01 NOTE — Assessment & Plan Note (Signed)
OFFICE VISIT   Steven Forbes, Steven Forbes  DOB:  1926-05-21                                       06/05/2007  ZOXWR#:[id number]   I saw this patient in the office today for followup of his left AV  fistula.  This was placed on April 25, 2007.  This was an upper arm  fistula.  He comes in for a 6-week followup visit to check on the  maturation of this.  He has had no symptoms referable to his left arm.   EXAMINATION:  Blood pressure is 172/88, heart rate is 80.  He has an  excellent thrill in his upper arm fistula on the left.  The vein appears  to be enlarging nicely.  He has a palpable radial pulse.   I think this will be adequate for access if and when he needs dialysis.  I will be happy to see him back at any time.   Di Kindle. Edilia Bo, M.D.  Electronically Signed   CSD/MEDQ  D:  06/05/2007  T:  06/06/2007  Job:  [id number]   cc:   [facility]

## 2011-03-04 IMAGING — CR DG SMALL BOWEL
1 series · 1 of 1 positions shown · non-contrast
Comparison: None.

CLINICAL DATA: 82-year-old male, evaluate for small bowel polyp.

SMALL BOWEL SERIES
TECHNIQUE: Following ingestion of a mixture of thin barium and
Entero Vu, serial small bowel images were obtained including spot
views of the terminal ileum.
Fluoroscopy time:  2.9 minutes.

[t abdomen supine *]
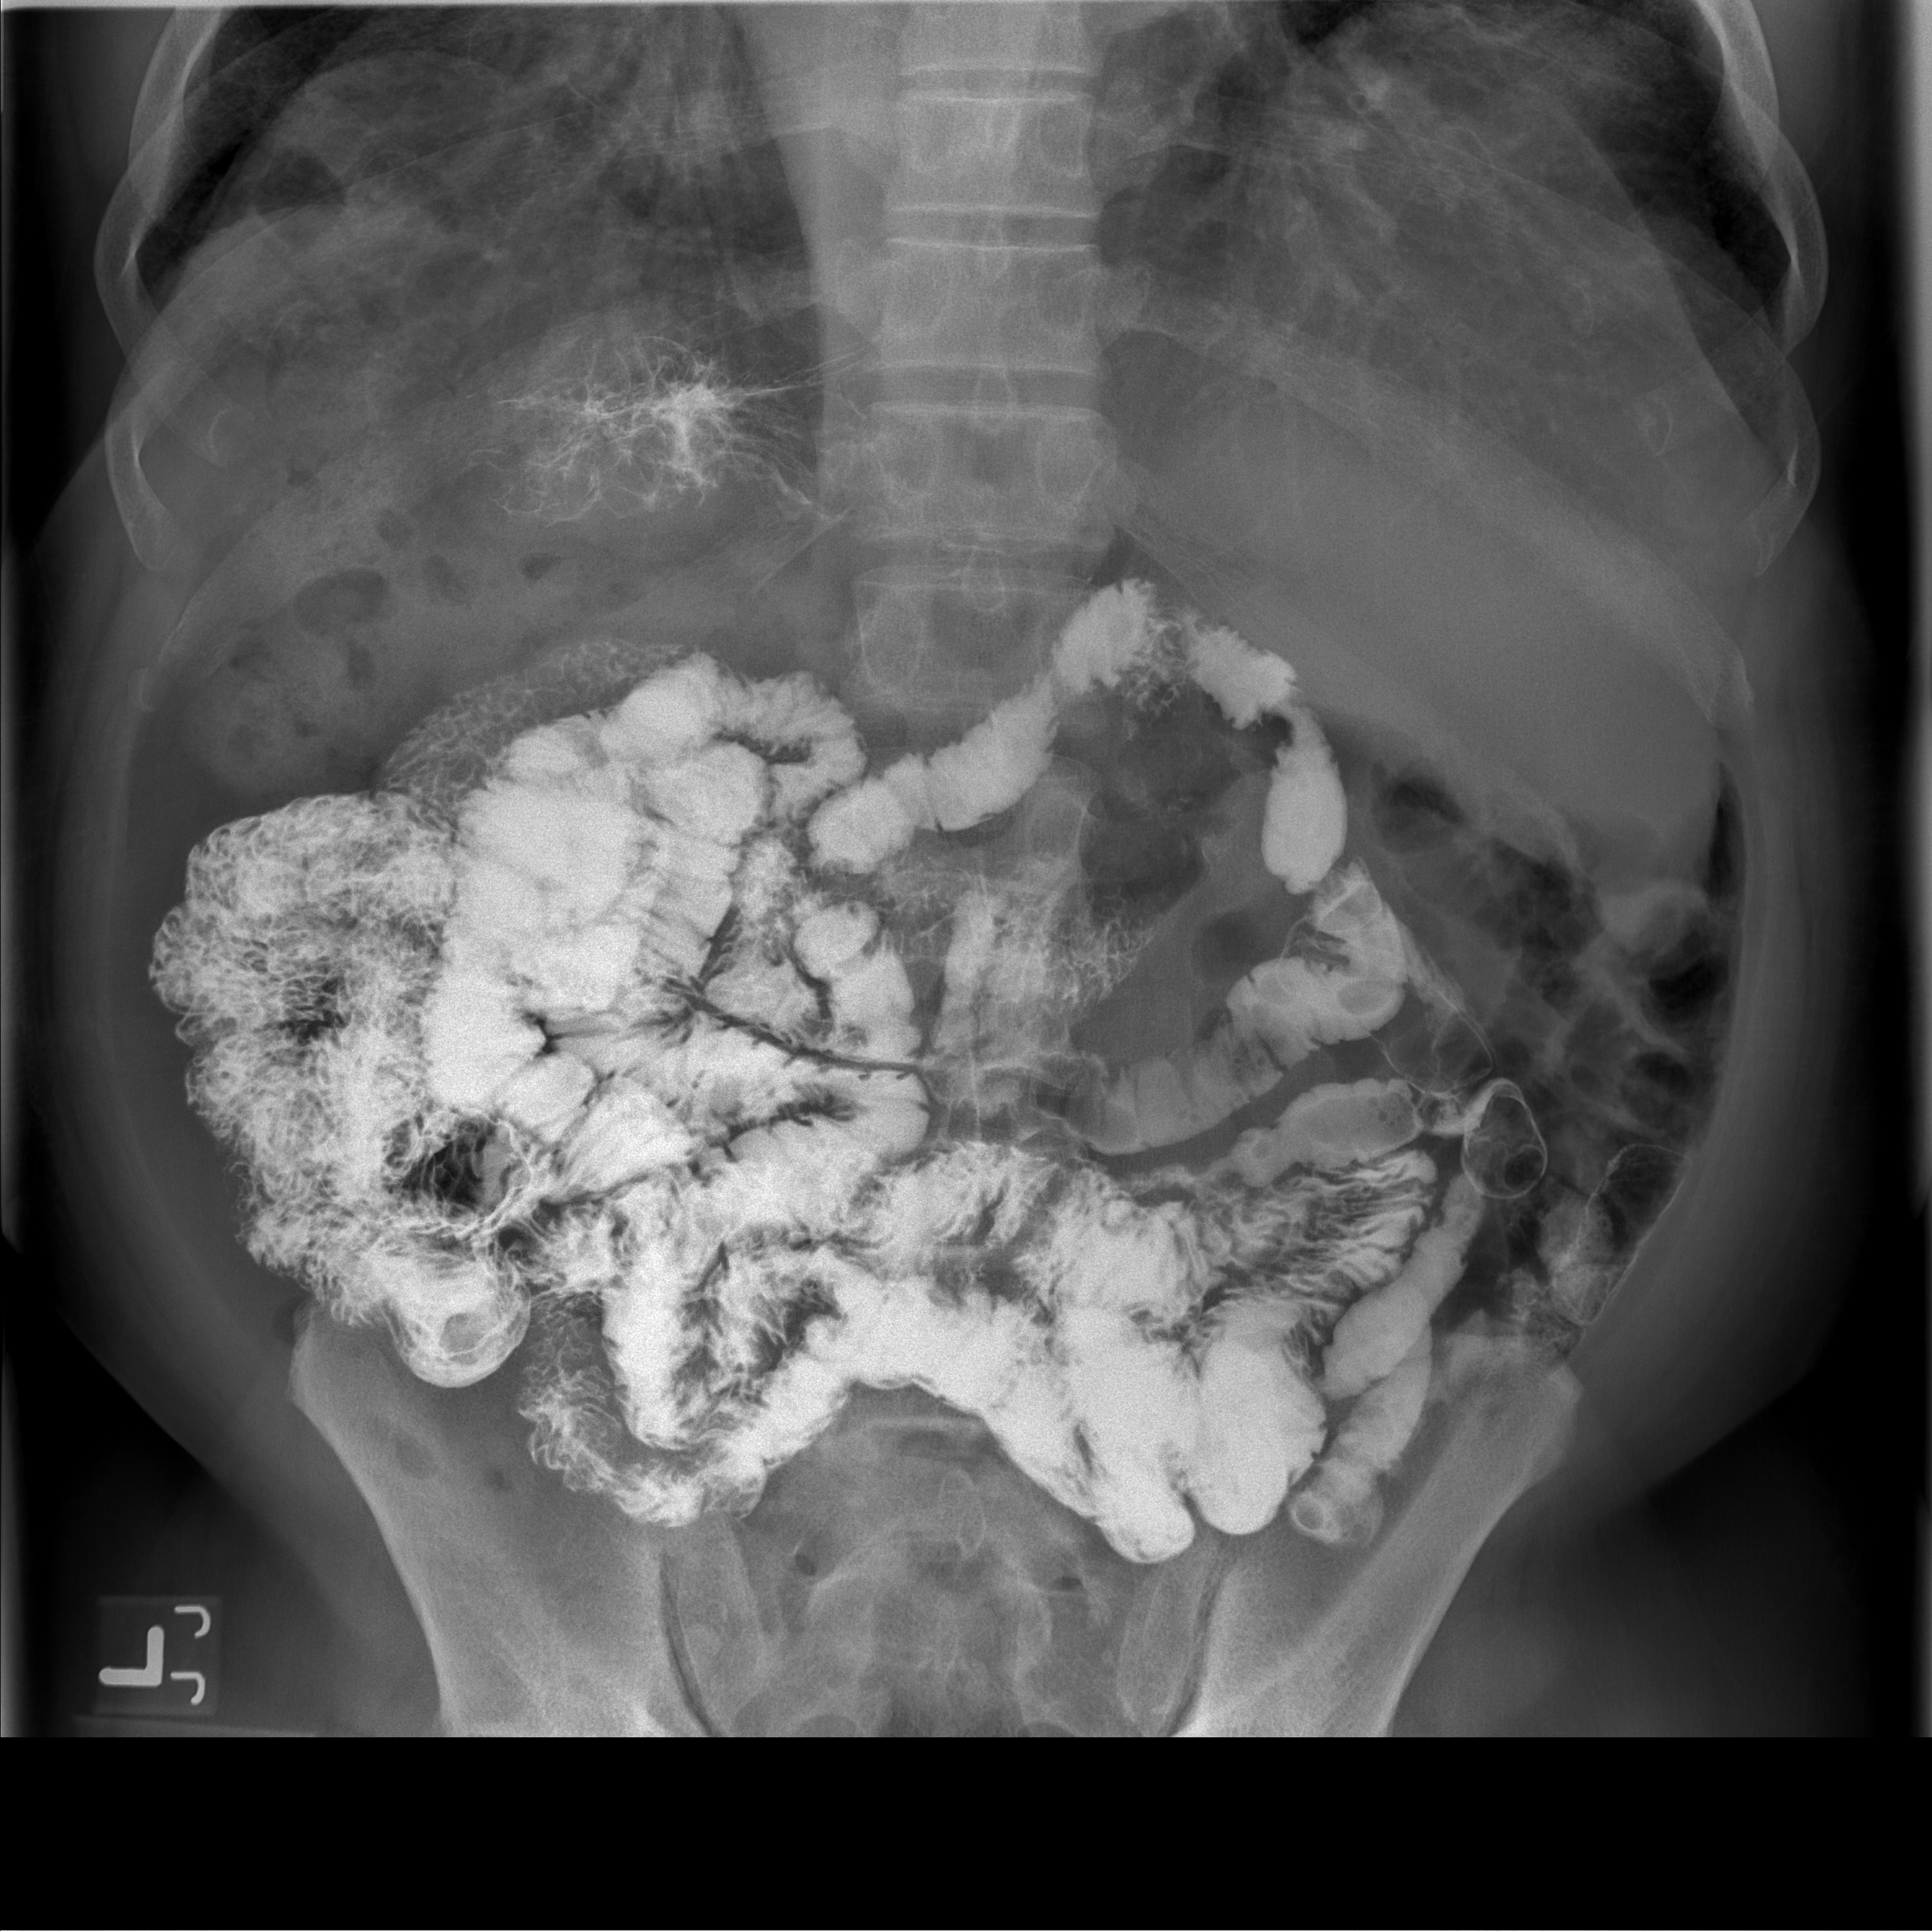

[1 of 1 positions shown; findings below may reference images not displayed]

FINDINGS: Preprocedural scout views of the abdomen are obtained.
Nonobstructed bowel gas pattern. Cardiomegaly.  Lung bases are
within normal limits. No acute osseous abnormality identified.

Contrast was continually administered during intermittent overhead
abdominal radiographs until contrast reached the colon.
Fluoroscopic spot film evaluation of the small bowel was then
performed.

On the initial film normal rotation of the duodenum is noted.
Stomach and proximal jejunal loops are within normal limits.  There
is a small volume of contrast distally in the esophagus which was
not correlated on any of the subsequent imaging and likely
represents ingested contrast which has not yet reached the stomach
(rather than reflux).

There is no obstruction to the forward flow of contrast throughout
small bowel loops.  Jejunal and ileal loops have normal caliber and
mucosal pattern throughout.  Transit time to the colon is 1 hour 20
minutes, within normal limits.  Scattered air bubbles are seen in
the small bowel loops.  None of these are seen to persist over the
course of the exam.

Fluoroscopic evaluation of the small bowel was then undertaken.
Normal peristalsis was noted.  No abnormal loops were identified.
The terminal ileum was difficult to individually delineate in the
right lower quadrant from a cluster of distal ileum as the cecum
was suboptimally distended with contrast.  Contrast was seen in the
colon to the level of the splenic flexure.  Multiple fluoroscopic
images of the distal ileum are unremarkable.
IMPRESSION: Suboptimal delineation of the terminal ileum, but otherwise
unremarkable small bowel follow-through.  No small bowel polyp
identified.

## 2011-04-06 ENCOUNTER — Telehealth: Payer: Self-pay

## 2011-04-06 NOTE — Telephone Encounter (Signed)
Spoke with patients wife regarding scheduling a office visit to discuss having a Upper Endoscopy/ Colonoscopy and patient's wife states the patient is resting but she has a lot of appointments right now for him and he cannot schedule at this time. She will discuss it with him when he wakes up and will call me back to schedule. Informed her the importance of returning my call to schedule a appointment. Pt verbalized understanding.

## 2011-05-05 ENCOUNTER — Other Ambulatory Visit (INDEPENDENT_AMBULATORY_CARE_PROVIDER_SITE_OTHER): Payer: Self-pay | Admitting: Ophthalmology

## 2011-05-06 ENCOUNTER — Encounter: Payer: Self-pay | Admitting: Vascular Surgery

## 2011-05-06 ENCOUNTER — Encounter (INDEPENDENT_AMBULATORY_CARE_PROVIDER_SITE_OTHER): Payer: Medicare Other

## 2011-05-06 DIAGNOSIS — R0989 Other specified symptoms and signs involving the circulatory and respiratory systems: Secondary | ICD-10-CM

## 2011-05-10 ENCOUNTER — Ambulatory Visit (INDEPENDENT_AMBULATORY_CARE_PROVIDER_SITE_OTHER): Payer: Medicare Other | Admitting: Vascular Surgery

## 2011-05-10 ENCOUNTER — Encounter: Payer: Self-pay | Admitting: Vascular Surgery

## 2011-05-10 VITALS — BP 150/67 | HR 109 | Temp 98.3°F | Resp 20 | Ht 63.0 in | Wt 180.0 lb

## 2011-05-10 DIAGNOSIS — L98499 Non-pressure chronic ulcer of skin of other sites with unspecified severity: Secondary | ICD-10-CM

## 2011-05-10 DIAGNOSIS — E1159 Type 2 diabetes mellitus with other circulatory complications: Secondary | ICD-10-CM

## 2011-05-10 NOTE — Progress Notes (Signed)
Subjective:     Patient ID: Steven Forbes, male   DOB: 12/16/1925, 77 y.o.   MRN: [id number]  HPI this 77 year old male patient was referred by Dr. Abel Presto for ischemic ulcers of the right foot. The patient states he developed some sores between the toes of the right foot several weeks ago. He saw a local podiatrist. He was started on antibiotics (doxycycline and clindamycin) he also was given a topical ointment. He continues to have pain in the right foot. He denies calf claudication symptoms. He denies any previous problems with either foot in the form of gangrene cellulitis or ulceration.  Past Medical History  Diagnosis Date  . Hyperlipidemia   . Anemia   . Thyroid disease   . Diabetes mellitus   . Hypertension   . Chronic kidney disease     Dialysis on MONDAY, [facility] and [facility]    History  Substance Use Topics  . Smoking status: Never Smoker   . Smokeless tobacco: Not on file  . Alcohol Use: No    History reviewed. No pertinent family history.  No Known Allergies  Current outpatient prescriptions:Castellani Paint LIQD, Apply topically as directed.  , Disp: , Rfl: ;  clindamycin (CLEOCIN) 150 MG capsule, Take 150 mg by mouth 4 (four) times daily.  , Disp: , Rfl: ;  folic acid-vitamin b complex-vitamin c-selenium-zinc (DIALYVITE) 3 MG TABS, Take 1 tablet by mouth daily.  , Disp: , Rfl: ;  Insulin Isophane Human (HUMULIN N PEN [place]), Inject 12 Units into the skin 2 (two) times daily.  , Disp: , Rfl:  omeprazole (PRILOSEC) 20 MG capsule, Take 20 mg by mouth daily.  , Disp: , Rfl: ;  simvastatin (ZOCOR) 40 MG tablet, Take 40 mg by mouth at bedtime.  , Disp: , Rfl: ;  traMADol (ULTRAM) 50 MG tablet, Take 50 mg by mouth every 6 (six) hours as needed.  , Disp: , Rfl: ;  doxycycline (VIBRAMYCIN) 100 MG capsule, Take 100 mg by mouth 2 (two) times daily.  , Disp: , Rfl:  labetalol (NORMODYNE) 300 MG tablet, Take 300 mg by mouth at bedtime.  , Disp: , Rfl:   Filed Vitals:   05/10/11 1258   Height: 5\' 3"  (1.6 m)  Weight: 180 lb (81.647 kg)    Body mass index is 31.89 kg/(m^2).       Review of Systems he denies chest pain or dyspnea on exertion. He has had some weight loss and anorexia. He has chronic renal insufficiency on hemodialysis Monday Wednesday and Friday. He said some decreased hearing. All other systems are negative the complete review of systems    Objective:   Physical Exam blood pressure 150/67 heart rates 109 temperature 98.3 respirations 20. Generally he is a chronically ill appearing male who is in no apparent distress alert and oriented x3 HEENT exam is normal for age Chest clear consultation no rhonchi or wheezing Cardiovascular reveals a regular rhythm with no murmurs Abdomen soft nontender with no masses Skin exam reveals ulcerations between several toes in the right foot which are superficial. There is no purulent drainage. He does have some scaly skin adjacent to these areas. Left foot is free of ulcerations. He has 1+ edema bilaterally. He has 3+ femoral pulses bilaterally with no popliteal or distal pulses. Neurologic exam normal with the exception of decreased sensation in the feet Musculoskeletal exam free of motion deformity    Assessment:     Today I ordered a lower extremity arterial Doppler exam  which are reviewed and interpreted. He has calcified vessels bilaterally consistent with diabetes mellitus and renal failure. The vessels are non-compressible. ABI on the right foot is 0.59. This patient has a combination of femoral popliteal and tibial occlusive disease with ischemic ulcers between the toes of the right foot. It likely this will heal without revascularization.    Plan:     Plan aortobifemoral angiography with bilateral lower extremity runoff and possible PTA and stenting of lesions amenable to this treatment in the right leg. Scheduled for August 30 by Dr. Gomez Cleverly.

## 2011-05-19 ENCOUNTER — Ambulatory Visit (HOSPITAL_COMMUNITY)
Admission: RE | Admit: 2011-05-19 | Discharge: 2011-05-19 | Disposition: A | Discharge status: Home / Self Care | Payer: Medicare Other | Source: Ambulatory Visit | Attending: Vascular Surgery | Admitting: Vascular Surgery

## 2011-05-19 DIAGNOSIS — I739 Peripheral vascular disease, unspecified: Secondary | ICD-10-CM | POA: Insufficient documentation

## 2011-05-19 DIAGNOSIS — L98499 Non-pressure chronic ulcer of skin of other sites with unspecified severity: Secondary | ICD-10-CM | POA: Insufficient documentation

## 2011-05-19 LAB — POCT I-STAT, CHEM 8
Calcium, Ion: 1.12 mmol/L (ref 1.12–1.32)
Chloride: 99 mEq/L (ref 96–112)
Glucose, Bld: 181 mg/dL — ABNORMAL HIGH (ref 70–99)
HCT: 35 % — ABNORMAL LOW (ref 39.0–52.0)
TCO2: 29 mmol/L (ref 0–100)

## 2011-05-20 ENCOUNTER — Encounter: Payer: Self-pay | Admitting: Vascular Surgery

## 2011-05-23 NOTE — Op Note (Signed)
Steven Forbes, Steven Forbes NO.:  [id number]  MEDICAL RECORD NO.:  [id number]  LOCATION:  [facility]SC                         FACILITY:  [facility]  PHYSICIAN:  Fransisco Hertz, MD       DATE OF BIRTH:  [birth year]  DATE OF PROCEDURE:  05/19/2011 DATE OF DISCHARGE:                              OPERATIVE REPORT   PROCEDURE: 1. Left common femoral artery cannulation under ultrasound guidance. 2. Aortogram. 3. Second-order arterial selection. 4. Right leg runoff. 5. Left leg runoff.  PREOPERATIVE DIAGNOSIS:  Right foot ulcers.  POSTOPERATIVE DIAGNOSIS:  Right foot ulcers.  SURGEON:  Fransisco Hertz, MD.  ANESTHESIA:  Conscious sedation.  ESTIMATED BLOOD LOSS:  Minimal.  CONTRAST:  150 mL.  SPECIMENS:  None.  FINDINGS: 1. A patent calcified aorta. 2. Patent bilateral common iliac arteries, external iliac arteries,     and internal iliac artery.  There is a right common iliac artery     stenosis less than 30%. 3. Patent right common femoral artery and profunda artery.  The right     SFA is patent proximally, but occludes about mid segment with     reconstitution distally via collaterals, heavy calcification to     this artery. 4. Right popliteal arteries patent. 5. Diseased trifurcation on the right side. 6. The right anterior tibial arteries occluded. 7. Diseased proximal peroneal and posterior tibial. 8. Runoff of the right foot is the peroneal and posterior tibial     arteries.  There is reconstitution at the dorsalis pedis via the     peroneal artery. 9. Left common femoral artery, profunda artery, and SFA are patent,     but the left SFA is diseased. 10.There is a diseased, but patent left popliteal artery. 11.Patent left trifurcation. 12.Diseased anterior tibial artery which occludes proximally on the     left side. 13.Diseased left peroneal artery. 14.The dominant runoff to the left foot is the posterior tibial     artery.  INDICATIONS:  This is an  77 year old gentleman, multiple comorbidities, presents with chief complaint of right foot ulcers.  Dr. Hart Rochester evaluated this patient and felt he had combined femoral popliteal and possible tibial disease.  Subsequently, it was felt that he needed an aortogram, possible intervention.  He is aware the risks of this procedure include bleeding, infection, possible embolization, possible rupture, possible dissection, possible need for additional procedures. He is aware of these risks and agreed to proceed forward.  DESCRIPTION OF OPERATION:  After full informed written consent was obtained from the patient, he was brought back to angio lab, and placed supine upon the angio table.  He was connected to monitoring equipment and given conscious sedation, amounts of which are documented in this chart.  He was then prepped and draped in standard fashion where aortogram bilateral leg run off.  I then turned my attention to his left groin under ultrasound guidance, cannulated common femoral artery which was heavily diseased, passed the Bentson wire up into the aorta.  The needle was exchanged out for a 5-French sheath which was loaded into the common femoral artery.  The dilator was removed.  An Omniflush catheter was loaded over the wire  and advanced the level of L1.  The wire was removed.  The catheter was connected to power injector circuit after performing a clotting de-airing maneuvers.  The power injector aortogram findings are as listed above.  I then pulled down the catheter to the proximal the aortic bifurcation using Bentson wire, an Omniflush catheter was selected out the left common iliac artery and was able to advance down into the right external iliac artery system from the left side.  An automated right leg runoff was then completed on this side. The findings of which are listed above.  I then placed the wire back through the catheter and reconstituted the crook of this  catheter, pulled the catheter and wire out.  Then, I connected the last left femoral sheath to the power injector circuit and completing a automated left leg runoff.  The findings of which are above.  Based on these findings, there may be a possibility of endovascularly reconstituting the right SFA.  I do not think he has any chance of long-term patency given the heavy calcification is present.  Additionally, there is poor runoff in this case with a poor tibial vessels.  There is some evidence on the lateral foot films.  There were shot on both the right and left side that in fact there may be a distal bypass option if these is adequate vein.  At this point, the left femoral sheath was aspirated and there was no further clot.  I then loaded heparinized saline into the left femoral sheath.  The plan is to pull it in the holding area.  COMPLICATIONS:  None.  CONDITION:  Stable.     Fransisco Hertz, MD     BLC/MEDQ  D:  05/19/2011  T:  05/19/2011  Job:  [id number]  Electronically Signed by Leonides Sake MD on 05/23/2011 09:49:56 PM

## 2011-05-24 ENCOUNTER — Ambulatory Visit (INDEPENDENT_AMBULATORY_CARE_PROVIDER_SITE_OTHER): Payer: Medicare Other | Admitting: Vascular Surgery

## 2011-05-24 ENCOUNTER — Encounter: Payer: Self-pay | Admitting: Vascular Surgery

## 2011-05-24 VITALS — BP 130/115 | HR 85 | Resp 16 | Ht 62.0 in | Wt 180.0 lb

## 2011-05-24 DIAGNOSIS — L98499 Non-pressure chronic ulcer of skin of other sites with unspecified severity: Secondary | ICD-10-CM

## 2011-05-24 NOTE — Progress Notes (Signed)
Subjective:     Patient ID: Steven Forbes, male   DOB: 12-17-1925, 77 y.o.   MRN: [id number] He has superficial ulcerations between several toes of his right foot which been present for several weeks. HPI this 77 year old male patient returns today to discuss revascularization of his right lower extremity. He had an angiogram performed by Dr. Johny Drilling, August 30 of this year. He was found to have severe diffuse calcified disease in both lower extremities. He was not felt to be a good candidate for endovascular treatment. He does have a patent below-knee popliteal artery on the right with disease in both the peroneal and posterior tibial arteries and total occlusion of the anterior tibial artery. He would be a candidate for either femoral limb he popliteal bypass grafting or possibly femoral posterior tibial bypass grafting. I discussed this with the patient and his family today and have recommended this to hopefully salvage his right lower extremity.   Past Medical History  Diagnosis Date  . Hyperlipidemia   . Anemia   . Diabetes mellitus   . Hypertension   . Chronic kidney disease     Dialysis on MONDAY, [facility] and [facility]    History  Substance Use Topics  . Smoking status: Never Smoker   . Smokeless tobacco: Not on file  . Alcohol Use: No    History reviewed. No pertinent family history.  No Known Allergies  Current outpatient prescriptions:Castellani Paint LIQD, Apply topically as directed.  , Disp: , Rfl: ;  clindamycin (CLEOCIN) 150 MG capsule, Take 150 mg by mouth 4 (four) times daily.  , Disp: , Rfl: ;  doxycycline (VIBRAMYCIN) 100 MG capsule, Take 100 mg by mouth 2 (two) times daily.  , Disp: , Rfl: ;  folic acid-vitamin b complex-vitamin c-selenium-zinc (DIALYVITE) 3 MG TABS, Take 1 tablet by mouth daily.  , Disp: , Rfl:  Insulin Isophane Human (HUMULIN N PEN [place]), Inject 12 Units into the skin 2 (two) times daily.  , Disp: , Rfl: ;  labetalol (NORMODYNE) 300 MG tablet, Take 300 mg  by mouth at bedtime.  , Disp: , Rfl: ;  omeprazole (PRILOSEC) 20 MG capsule, Take 20 mg by mouth daily.  , Disp: , Rfl: ;  simvastatin (ZOCOR) 40 MG tablet, Take 40 mg by mouth at bedtime.  , Disp: , Rfl:  traMADol (ULTRAM) 50 MG tablet, Take 50 mg by mouth every 6 (six) hours as needed.  , Disp: , Rfl:   BP 130/115  Pulse 85  Resp 16  Ht 5\' 2"  (1.575 m)  Wt 180 lb (81.647 kg)  BMI 32.92 kg/m2  Body mass index is 32.92 kg/(m^2).        Review of Systems     Objective:   Physical Exam on physical exam today blood pressure 130/115 heart rate 85 respirations 16 General he is a chronically ill appearing male who is in no apparent distress. HEENT exam is normal for age Chest endoscopy dilation no rhonchi or wheezing Cardiovascular exam was regular rhythm no murmurs Abdomen soft nontender with no masses Right leg has 3+ femoral absent popliteal and distal pulses. There are superficial ulcerations between the first second third and fourth toes. Left leg has 3+ femoral pulse palpable.     Assessment:    I have reviewed his angiogram performed Dr. Johny Drilling and believe we should proceed with femoral popliteal or distal bypass grafting.    Plan:    right femoral popliteal or posterior tibial bypass scheduled for Thursday, September  6. He will have hemodialysis tomorrow Wednesday as scheduled. Risks and benefits of this procedure have been thoroughly discussed and the fact that this may not lead to wound healing.

## 2011-05-26 ENCOUNTER — Inpatient Hospital Stay (HOSPITAL_COMMUNITY)
Admission: RE | Admit: 2011-05-26 | Discharge: 2011-05-29 | DRG: 252 | Disposition: A | Discharge status: Home Health | Payer: Medicare Other | Source: Ambulatory Visit | Attending: Vascular Surgery | Admitting: Vascular Surgery

## 2011-05-26 ENCOUNTER — Inpatient Hospital Stay (HOSPITAL_COMMUNITY): Payer: Medicare Other

## 2011-05-26 DIAGNOSIS — Z992 Dependence on renal dialysis: Secondary | ICD-10-CM

## 2011-05-26 DIAGNOSIS — N2581 Secondary hyperparathyroidism of renal origin: Secondary | ICD-10-CM | POA: Diagnosis present

## 2011-05-26 DIAGNOSIS — N186 End stage renal disease: Secondary | ICD-10-CM | POA: Diagnosis present

## 2011-05-26 DIAGNOSIS — I120 Hypertensive chronic kidney disease with stage 5 chronic kidney disease or end stage renal disease: Secondary | ICD-10-CM | POA: Diagnosis present

## 2011-05-26 DIAGNOSIS — E119 Type 2 diabetes mellitus without complications: Secondary | ICD-10-CM | POA: Diagnosis present

## 2011-05-26 DIAGNOSIS — I70219 Atherosclerosis of native arteries of extremities with intermittent claudication, unspecified extremity: Secondary | ICD-10-CM

## 2011-05-26 LAB — COMPREHENSIVE METABOLIC PANEL
Alkaline Phosphatase: 212 U/L — ABNORMAL HIGH (ref 39–117)
BUN: 20 mg/dL (ref 6–23)
Chloride: 98 mEq/L (ref 96–112)
GFR calc Af Amer: 11 mL/min — ABNORMAL LOW (ref >60)
Glucose, Bld: 124 mg/dL — ABNORMAL HIGH (ref 70–99)
Potassium: 4.1 mEq/L (ref 3.5–5.1)
Total Bilirubin: 0.2 mg/dL — ABNORMAL LOW (ref 0.3–1.2)

## 2011-05-26 LAB — SURGICAL PCR SCREEN
MRSA, PCR: NEGATIVE
Staphylococcus aureus: NEGATIVE

## 2011-05-26 LAB — CBC
HCT: 34.8 % — ABNORMAL LOW (ref 39.0–52.0)
Hemoglobin: 9.6 g/dL — ABNORMAL LOW (ref 13.0–17.0)
MCH: 30.2 pg (ref 26.0–34.0)
MCHC: 32.2 g/dL (ref 30.0–36.0)
MCHC: 32.5 g/dL (ref 30.0–36.0)
MCV: 93.5 fL (ref 78.0–100.0)
Platelets: 257 10*3/uL (ref 150–400)
RDW: 14 % (ref 11.5–15.5)
RDW: 13.9 % (ref 11.5–15.5)

## 2011-05-26 LAB — BASIC METABOLIC PANEL
Calcium: 8 mg/dL — ABNORMAL LOW (ref 8.4–10.5)
GFR calc Af Amer: 11 mL/min — ABNORMAL LOW (ref >60)
GFR calc non Af Amer: 9 mL/min — ABNORMAL LOW (ref >60)
Glucose, Bld: 182 mg/dL — ABNORMAL HIGH (ref 70–99)
Sodium: 136 mEq/L (ref 135–145)

## 2011-05-26 LAB — GLUCOSE, CAPILLARY

## 2011-05-26 LAB — PROTIME-INR: Prothrombin Time: 13 seconds (ref 11.6–15.2)

## 2011-05-27 ENCOUNTER — Inpatient Hospital Stay (HOSPITAL_COMMUNITY): Payer: Medicare Other

## 2011-05-27 LAB — BASIC METABOLIC PANEL
CO2: 27 mEq/L (ref 19–32)
Glucose, Bld: 105 mg/dL — ABNORMAL HIGH (ref 70–99)
Potassium: 4.7 mEq/L (ref 3.5–5.1)
Sodium: 137 mEq/L (ref 135–145)

## 2011-05-27 LAB — CBC
Hemoglobin: 9.7 g/dL — ABNORMAL LOW (ref 13.0–17.0)
MCH: 29.7 pg (ref 26.0–34.0)
RBC: 3.27 MIL/uL — ABNORMAL LOW (ref 4.22–5.81)

## 2011-05-27 LAB — IRON AND TIBC
Iron: 31 ug/dL — ABNORMAL LOW (ref 42–135)
Saturation Ratios: 18 % — ABNORMAL LOW (ref 20–55)
TIBC: 170 ug/dL — ABNORMAL LOW (ref 215–435)
UIBC: 139 ug/dL (ref 125–400)

## 2011-05-27 LAB — GLUCOSE, CAPILLARY: Glucose-Capillary: 112 mg/dL — ABNORMAL HIGH (ref 70–99)

## 2011-05-28 DIAGNOSIS — L98499 Non-pressure chronic ulcer of skin of other sites with unspecified severity: Secondary | ICD-10-CM

## 2011-05-28 DIAGNOSIS — I739 Peripheral vascular disease, unspecified: Secondary | ICD-10-CM

## 2011-05-28 LAB — GLUCOSE, CAPILLARY: Glucose-Capillary: 96 mg/dL (ref 70–99)

## 2011-05-29 LAB — GLUCOSE, CAPILLARY

## 2011-05-30 LAB — CROSSMATCH
Antibody Screen: NEGATIVE
Unit division: 0

## 2011-06-03 ENCOUNTER — Encounter: Payer: Self-pay | Admitting: Vascular Surgery

## 2011-06-06 ENCOUNTER — Encounter: Payer: Self-pay | Admitting: Vascular Surgery

## 2011-06-06 NOTE — Op Note (Signed)
NAMEBRYOR, RAMI NO.:  [id number]  MEDICAL RECORD NO.:  [id number]  LOCATION:  2550                         FACILITY:  [facility]  PHYSICIAN:  Quita Skye. Hart Rochester, M.D.  DATE OF BIRTH:  07/06/1926  DATE OF PROCEDURE:  05/26/2011 DATE OF DISCHARGE:                              OPERATIVE REPORT   PREOPERATIVE DIAGNOSIS:  Ischemic toes, right foot secondary to femoral- popliteal and tibial occlusive disease.  POSTOPERATIVE DIAGNOSIS:  Ischemic toes, right foot secondary to femoral- popliteal and tibial occlusive disease.  OPERATIONS:  Right common femoral-to-popliteal (below knee) bypass using a nonreversed translocated saphenous vein graft from right leg with intraoperative arteriogram.  SURGEON:  Quita Skye. Hart Rochester, MD  FIRST ASSISTANT:  Pecola Leisure, PA  ANESTHESIA:  General endotracheal.  PROCEDURE IN DETAIL:  The patient was taken to the operating room, placed in the supine position, at which time, satisfactory general endotracheal anesthesia was administered.  Right leg was prepped with Betadine scrub and solution, draped in routine sterile manner. Saphenous vein was exposed at the saphenofemoral junction, traced distally through multiple incisions along the medial aspect of the right leg.  Unfortunately, it did bifurcate and trifurcate early and was borderline in size from the proximal thigh to the calf, but did seem to be suitable.  This branch was ligated with 4-0 silk ties and divided. It was removed, gently dilated with heparinized saline, marked for orientation purposes.  It seemed to be borderline, but adequate.  The common superficial and profunda femoris arteries were dissected free in the inguinal incision.  There was diffuse calcific plaque throughout, but there was a soft area on the anterior aspect of the common femoral artery with an excellent pulse.  Popliteal artery was then exposed in the below-knee position where it was also a  diseased vessel with palpable plaque, but was widely patent on the angiogram.  There was severe tibial disease noted on the angiogram with occlusion of the anterior tibial artery and severe significant disease in the proximal posterior tib and peroneal arteries.  The posterior tibial artery also had significant disease at the ankle, however.  It was decided the best plan given the size of the vein would be to use the vein into the below- knee popliteal area.  Popliteal artery was dissected free, encircled with vessel loops.  Subfascial anatomic tunnel was created and the patient was heparinized.  Femoral vessels were then occluded with vascular clamps.  Longitudinal opening made in the common femoral artery with 15 blade, extended with Potts scissors.  Proximal end of the vein was spatulated and anastomosed end-to-side with 6-0 Prolene.  Clamps were released.  Excellent pulse was present down to the first set of competent valves.  The valves were rendered incompetent using a retrograde valvulotome with resultant excellent flow of the distal endof the vein graft even though it was marginal in size.  It was carefully delivered through the tunnel, popliteal artery occluded proximally and distally in the below-knee position, opened with 15 blade, extended with Potts scissors.  Vein was carefully measured, spatulated, and anastomosed end-to-side with 6-0 Prolene.  The popliteal artery was diffusely diseased as noted, but widely patent.  Following completion of  this, intraoperative arteriogram was performed which revealed, as noted, significant disease in all tibial vessels, best vessel being the posterior tibial after it was diseased proximally; however, it was diseased at the ankle and the peroneal was diseased proximally as well with occlusion of the anterior tibial artery throughout.  No protamine was given.  The wound was irrigated with saline, closed in layers with Vicryl in subcuticular  fashion with Dermabond.  The patient was taken to the recovery room in stable condition.     Quita Skye Hart Rochester, M.D.     JDL/MEDQ  D:  05/26/2011  T:  05/26/2011  Job:  [id number]  Electronically Signed by Josephina Gip M.D. on 06/06/2011 02:16:28 PM

## 2011-06-07 ENCOUNTER — Ambulatory Visit (INDEPENDENT_AMBULATORY_CARE_PROVIDER_SITE_OTHER): Payer: Medicare Other | Admitting: Vascular Surgery

## 2011-06-07 ENCOUNTER — Ambulatory Visit: Payer: Medicare Other | Admitting: Vascular Surgery

## 2011-06-07 DIAGNOSIS — I739 Peripheral vascular disease, unspecified: Secondary | ICD-10-CM

## 2011-06-07 NOTE — Assessment & Plan Note (Signed)
OFFICE VISIT  Steven Forbes, Steven Forbes DOB:  06-12-1926                                       06/07/2011 CHART#:[id number]  The patient is an 77 year old male patient status post right femoral- popliteal bypass graft with saphenous vein performed on 05/26/2011 for severe ischemia of the distal right foot.  He returns today with nice healing of the surgical wounds and a 3+ femoral and popliteal graft pulse.  He does have demarcation of the gangrenous toes of the right foot, particularly the second and third and some demarcation of the skin on the anterior aspect of the foot.  He has been having home health care do moist saline gauze dressing changes daily at home since his discharge from the hospital.  PHYSICAL EXAMINATION:  Vital signs:  Today his blood pressure is 165/71, heart rate 114, respirations 16.  General:  He is a well-developed elderly male who is in no apparent distress but chronically ill.  As noted, his leg incisions have healed nicely with 3+ popliteal graft pulse with demarcating gangrenous toes and poor skin on the anterior aspect of the foot.  I think we should refer him to Dr. Aldean Baker to follow the foot and decide if forefoot amputation could possibly heal in the near future.  I explained to the family that there is nothing we can do about the micro circulation on the foot and that he has severe tibial disease with occlusion of his vessels at the ankle level.  We will notify Dr. Lajoyce Corners today of this referral and the patient will return to see [place] in 3 months to check the patency of the graft with a duplex scan.    Steven Forbes, M.D. Electronically Signed  JDL/MEDQ  D:  06/07/2011  T:  06/07/2011  Job:  [id number]  cc:   Gwen Pounds, MD Nadara Mustard, MD

## 2011-06-13 LAB — CBC
HCT: 31.2 — ABNORMAL LOW
Hemoglobin: 10.4 — ABNORMAL LOW
Platelets: 234
RBC: 3.46 — ABNORMAL LOW
WBC: 6.8

## 2011-06-13 LAB — IRON AND TIBC
Iron: 75
TIBC: 287

## 2011-06-13 LAB — RENAL FUNCTION PANEL
CO2: 26
Chloride: 102
GFR calc Af Amer: 10 — ABNORMAL LOW
GFR calc non Af Amer: 9 — ABNORMAL LOW
Glucose, Bld: 101 — ABNORMAL HIGH
Sodium: 139

## 2011-06-14 LAB — PTH, INTACT AND CALCIUM
Calcium, Total (PTH): 8.6
PTH: 198.7 — ABNORMAL HIGH

## 2011-06-14 LAB — RENAL FUNCTION PANEL
Albumin: 3.5
BUN: 74 — ABNORMAL HIGH
GFR calc Af Amer: 9 — ABNORMAL LOW
GFR calc non Af Amer: 7 — ABNORMAL LOW
Phosphorus: 5 — ABNORMAL HIGH
Potassium: 4
Sodium: 139

## 2011-06-14 NOTE — Progress Notes (Signed)
Subjective:     Patient ID: Steven Forbes, male   DOB: 1926-06-11, 77 y.o.   MRN: [id number]  HPI  Due to power outage, this office note was scanned into the EPIC system  Review of Systems     Objective:   Physical Exam     Assessment:         Plan:

## 2011-06-16 LAB — PTH, INTACT AND CALCIUM
Calcium, Total (PTH): 10.4
PTH: 23.4
PTH: 126.8 — ABNORMAL HIGH

## 2011-06-16 LAB — RENAL FUNCTION PANEL
Albumin: 3.5
BUN: 86 — ABNORMAL HIGH
Calcium: 8.8
Creatinine, Ser: 8.89 — ABNORMAL HIGH
GFR calc Af Amer: 7 — ABNORMAL LOW
GFR calc Af Amer: 7 — ABNORMAL LOW
GFR calc non Af Amer: 6 — ABNORMAL LOW
Phosphorus: 5.8 — ABNORMAL HIGH
Phosphorus: 5.3 — ABNORMAL HIGH
Potassium: 4.1
Sodium: 140

## 2011-06-16 LAB — IRON AND TIBC: TIBC: 278

## 2011-06-16 LAB — CBC
MCHC: 32.8
RBC: 3.76 — ABNORMAL LOW

## 2011-06-16 LAB — HEMOGLOBIN AND HEMATOCRIT, BLOOD: HCT: 31.1 — ABNORMAL LOW

## 2011-06-17 LAB — RENAL FUNCTION PANEL
BUN: 65 — ABNORMAL HIGH
CO2: 27
Chloride: 100
Creatinine, Ser: 8.4 — ABNORMAL HIGH

## 2011-06-17 LAB — HEMOGLOBIN AND HEMATOCRIT, BLOOD: Hemoglobin: 9.8 — ABNORMAL LOW

## 2011-06-21 LAB — POCT HEMOGLOBIN-HEMACUE
Hemoglobin: 12 g/dL — ABNORMAL LOW (ref 13.0–17.0)
Hemoglobin: 9.6 g/dL — ABNORMAL LOW (ref 13.0–17.0)

## 2011-06-22 LAB — RENAL FUNCTION PANEL
Albumin: 3 — ABNORMAL LOW
BUN: 70 — ABNORMAL HIGH
Chloride: 99
GFR calc Af Amer: 6 — ABNORMAL LOW
GFR calc non Af Amer: 5 — ABNORMAL LOW
Phosphorus: 6 — ABNORMAL HIGH
Potassium: 3.7
Sodium: 139

## 2011-06-22 LAB — IRON AND TIBC
Saturation Ratios: 21
TIBC: 280

## 2011-06-22 LAB — PTH, INTACT AND CALCIUM: PTH: 179.1 — ABNORMAL HIGH

## 2011-06-24 LAB — RENAL FUNCTION PANEL
Albumin: 3 g/dL — ABNORMAL LOW (ref 3.5–5.2)
Chloride: 101 mEq/L (ref 96–112)
Glucose, Bld: 236 mg/dL — ABNORMAL HIGH (ref 70–99)
Phosphorus: 4.8 mg/dL — ABNORMAL HIGH (ref 2.3–4.6)
Potassium: 4.1 mEq/L (ref 3.5–5.1)
Sodium: 136 mEq/L (ref 135–145)

## 2011-06-24 LAB — PTH, INTACT AND CALCIUM: PTH: 146.9 pg/mL — ABNORMAL HIGH (ref 14.0–72.0)

## 2011-06-24 LAB — POCT HEMOGLOBIN-HEMACUE: Hemoglobin: 9.5 g/dL — ABNORMAL LOW (ref 13.0–17.0)

## 2011-06-24 LAB — IRON AND TIBC: TIBC: 245 ug/dL (ref 215–435)

## 2011-07-01 LAB — CBC
HCT: 44.1
Platelets: 317
RBC: 4.87
WBC: 7.9

## 2011-07-01 LAB — RENAL FUNCTION PANEL
BUN: 34 — ABNORMAL HIGH
CO2: 31
Chloride: 102
Creatinine, Ser: 4.87 — ABNORMAL HIGH

## 2011-07-01 LAB — IRON AND TIBC
Iron: 315 — ABNORMAL HIGH
Saturation Ratios: 83 — ABNORMAL HIGH
UIBC: 66

## 2011-07-04 LAB — CBC
HCT: 28.6 — ABNORMAL LOW
HCT: 14.7 — ABNORMAL LOW
HCT: 23.5 — ABNORMAL LOW
HCT: 25.6 — ABNORMAL LOW
Hemoglobin: 9.1 — ABNORMAL LOW
Hemoglobin: 9.3 — ABNORMAL LOW
Hemoglobin: 9.6 — ABNORMAL LOW
Hemoglobin: 4.9 — CL
Hemoglobin: 8 — ABNORMAL LOW
Hemoglobin: 8.4 — ABNORMAL LOW
MCHC: 33.4
MCHC: 33.8
MCHC: 33.2
MCHC: 33.8
MCV: 91.6
MCV: 90.4
MCV: 91.1
MCV: 94.1
Platelets: 157
Platelets: 169
Platelets: 182
Platelets: 225
RBC: 2.96 — ABNORMAL LOW
RBC: 1.62 — ABNORMAL LOW
RBC: 2.61 — ABNORMAL LOW
RBC: 2.75 — ABNORMAL LOW
RDW: 14.7 — ABNORMAL HIGH
RDW: 15.3 — ABNORMAL HIGH
RDW: 14.5 — ABNORMAL HIGH
RDW: 15.1 — ABNORMAL HIGH
RDW: 15.2 — ABNORMAL HIGH
WBC: 9.6
WBC: 10.4
WBC: 11.3 — ABNORMAL HIGH
WBC: 13.2 — ABNORMAL HIGH
WBC: 5.7

## 2011-07-04 LAB — POCT I-STAT 4, (NA,K, GLUC, HGB,HCT)
Hemoglobin: 9.5 — ABNORMAL LOW
Sodium: 141

## 2011-07-04 LAB — IRON AND TIBC
Saturation Ratios: 30
UIBC: 194

## 2011-07-04 LAB — RENAL FUNCTION PANEL
Albumin: 2.6 — ABNORMAL LOW
Albumin: 2.6 — ABNORMAL LOW
Albumin: 2.2 — ABNORMAL LOW
Albumin: 2.3 — ABNORMAL LOW
BUN: 149 — ABNORMAL HIGH
BUN: 26 — ABNORMAL HIGH
BUN: 206 — ABNORMAL HIGH
CO2: 24
Calcium: 8 — ABNORMAL LOW
Calcium: 8.4
Chloride: 112
Chloride: 108
Creatinine, Ser: 4.8 — ABNORMAL HIGH
Creatinine, Ser: 8 — ABNORMAL HIGH
GFR calc Af Amer: 8 — ABNORMAL LOW
GFR calc non Af Amer: 6 — ABNORMAL LOW
GFR calc non Af Amer: 7 — ABNORMAL LOW
Glucose, Bld: 107 — ABNORMAL HIGH
Phosphorus: 3.9
Phosphorus: 7.4 — ABNORMAL HIGH
Potassium: 3.6
Potassium: 3.9

## 2011-07-04 LAB — I-STAT 8, (EC8 V) (CONVERTED LAB)
BUN: >140 — ABNORMAL HIGH
Chloride: 94 — ABNORMAL LOW
HCT: 21 — ABNORMAL LOW
pCO2, Ven: 44.7 — ABNORMAL LOW
pH, Ven: 7.254

## 2011-07-04 LAB — FERRITIN: Ferritin: 53 (ref 22–322)

## 2011-07-04 LAB — CROSSMATCH
ABO/RH(D): A POS
Antibody Screen: NEGATIVE

## 2011-07-04 LAB — BASIC METABOLIC PANEL
CO2: 24
Calcium: 8.7
Chloride: 99
Creatinine, Ser: 8.26 — ABNORMAL HIGH
GFR calc Af Amer: 8 — ABNORMAL LOW
GFR calc non Af Amer: 6 — ABNORMAL LOW
Glucose, Bld: 131 — ABNORMAL HIGH
Potassium: 3.8
Sodium: 145

## 2011-07-04 LAB — PTH, INTACT AND CALCIUM
Calcium, Total (PTH): 7.8 — ABNORMAL LOW
PTH: 70.6

## 2011-07-04 LAB — COMPREHENSIVE METABOLIC PANEL
Albumin: 2.2 — ABNORMAL LOW
BUN: 223 — ABNORMAL HIGH
Calcium: 8.2 — ABNORMAL LOW
Creatinine, Ser: 8.71 — ABNORMAL HIGH
GFR calc Af Amer: 7 — ABNORMAL LOW
Total Protein: 4.4 — ABNORMAL LOW

## 2011-07-04 LAB — HEMOGLOBIN A1C: Hgb A1c MFr Bld: 5.5

## 2011-07-04 LAB — HEMOGLOBIN AND HEMATOCRIT, BLOOD: HCT: 24 — ABNORMAL LOW

## 2011-07-04 LAB — H. PYLORI ANTIBODY, IGG: H Pylori IgG: 3.7 — ABNORMAL HIGH

## 2011-07-05 ENCOUNTER — Ambulatory Visit (INDEPENDENT_AMBULATORY_CARE_PROVIDER_SITE_OTHER): Payer: Medicare Other | Admitting: Ophthalmology

## 2011-07-05 DIAGNOSIS — E11319 Type 2 diabetes mellitus with unspecified diabetic retinopathy without macular edema: Secondary | ICD-10-CM

## 2011-07-05 DIAGNOSIS — H35379 Puckering of macula, unspecified eye: Secondary | ICD-10-CM

## 2011-07-05 DIAGNOSIS — H43819 Vitreous degeneration, unspecified eye: Secondary | ICD-10-CM

## 2011-07-05 LAB — RENAL FUNCTION PANEL
BUN: 44 — ABNORMAL HIGH
Chloride: 105
Glucose, Bld: 212 — ABNORMAL HIGH
Potassium: 3.4 — ABNORMAL LOW

## 2011-07-11 NOTE — Discharge Summary (Signed)
Steven Forbes, Steven Forbes NO.:  [id number]  MEDICAL RECORD NO.:  [id number]  LOCATION:  2009                         FACILITY:  [facility]  PHYSICIAN:  Quita Skye. Hart Rochester, M.D.  DATE OF BIRTH:  September 27, 1925  DATE OF ADMISSION:  05/26/2011 DATE OF DISCHARGE:  05/29/2011                              DISCHARGE SUMMARY   ADMISSION DIAGNOSIS:  Ischemic toes on the right foot secondary to femoral, popliteal, and tibial occlusive disease.  HISTORY OF PRESENT ILLNESS:  This is an 77 year old male patient who returns to discuss revascularization of his right lower extremity.  An angiogram was performed by Dr. Imogene Burn on May 19, 2011.  He was found to have severe diffuse calcified disease in both lower extremities.  He was not felt to be a good candidate for endovascular treatment.  He does have a patent below-the-knee popliteal artery on the right with disease in both the peroneal and posterior tibial arteries and total occlusion of the anterior tibial artery.  He would be a candidate for either femoral limb of the popliteal bypass grafting or possibly femoral-to- posterior tibial bypass grafting.  Dr. Hart Rochester did discuss option.  HOSPITAL COURSE:  The patient was admitted to the hospital and taken to the operating room on May 26, 2011, where he underwent a right common femoral-to-popliteal below-the-knee bypass using a nonreversed translocated saphenous vein graft from the right leg with intraoperative arteriogram.  He tolerated the procedure well and was transported to the recovery room in satisfactory condition.  By postoperative day #1, he was doing well and has Doppler signal on his right dorsalis pedal pulse and posterior tibial pulse.  He was transferred to the telemetry floor that day.  Postoperative ABIs on the right was 0.91 as compared to preoperative ABIs on the right 0.59.  PT consult was also obtained.  The patient undergoes hemodialysis on Mondays, Wednesdays and  Fridays.  The patient was discharged to home on postoperative day #3.  Otherwise, his postoperative course include increasing ambulation as well as increasing intake of solids without difficulty.  BRIEF DISCHARGE INSTRUCTIONS:  He was discharged home with extensive instructions on wound care and progressive ambulation.  He was instructed not to drive or perform any heavy lifting for 1 month.  DISCHARGE DIAGNOSES: 1. Ischemic toes secondary to femoral, popliteal, and tibial occlusive     disease on the right.     a.     Status post right common femoral to below-the-knee popliteal      bypass grafting on May 26, 2011. 2. Chronic kidney disease with hemodialysis on Mondays, Wednesdays,     and Fridays. 3. Hypertension. 4. Diabetes. 5. Hyperlipidemia. 6. Anemia.  DISCHARGE MEDICATIONS: 1. Oxycodone 5 mg one p.o. q.4-6 h. p.r.n. pain, #30, no refills. 2. Benadryl 25 mg one p.o. daily p.r.n. 3. Humulin N 12 units subcu b.i.d. 4. Labetalol 300 mg one p.o. daily. 5. Omeprazole 20 mg p.o. daily. 6. Renal vitamin p.o. daily. 7. Simvastatin 40 mg p.o. daily. 8. Terbinafine 250 mg p.o. daily. 9. Tramadol 50 mg p.o. q.6 h. p.r.n. pain. 10.Tums OTC three tablets p.o. t.i.d. with meals. 11.Castellani paint Pedinol liquid applied of fungus on toe daily.  FOLLOWUP:  The patient is to follow up with Dr. Hart Rochester in 2 weeks.     Newton Pigg, PA   ______________________________ Quita Skye Hart Rochester, M.D.    SE/MEDQ  D:  06/29/2011  T:  06/30/2011  Job:  [id number]  Electronically Signed by Newton Pigg PA on 07/04/2011 12:40:43 PM Electronically Signed by Josephina Gip M.D. on 07/11/2011 09:57:35 AM

## 2011-07-19 ENCOUNTER — Inpatient Hospital Stay (HOSPITAL_COMMUNITY)
Admission: RE | Admit: 2011-07-19 | Discharge: 2011-07-21 | DRG: 239 | Disposition: A | Discharge status: Home / Self Care | Payer: Medicare Other | Source: Ambulatory Visit | Attending: Orthopedic Surgery | Admitting: Orthopedic Surgery

## 2011-07-19 ENCOUNTER — Other Ambulatory Visit: Payer: Self-pay | Admitting: Orthopedic Surgery

## 2011-07-19 DIAGNOSIS — D649 Anemia, unspecified: Secondary | ICD-10-CM | POA: Diagnosis present

## 2011-07-19 DIAGNOSIS — I70269 Atherosclerosis of native arteries of extremities with gangrene, unspecified extremity: Principal | ICD-10-CM | POA: Diagnosis present

## 2011-07-19 DIAGNOSIS — N2581 Secondary hyperparathyroidism of renal origin: Secondary | ICD-10-CM | POA: Diagnosis present

## 2011-07-19 DIAGNOSIS — I120 Hypertensive chronic kidney disease with stage 5 chronic kidney disease or end stage renal disease: Secondary | ICD-10-CM | POA: Diagnosis present

## 2011-07-19 DIAGNOSIS — E1129 Type 2 diabetes mellitus with other diabetic kidney complication: Secondary | ICD-10-CM | POA: Diagnosis present

## 2011-07-19 DIAGNOSIS — Z992 Dependence on renal dialysis: Secondary | ICD-10-CM

## 2011-07-19 DIAGNOSIS — N186 End stage renal disease: Secondary | ICD-10-CM | POA: Diagnosis present

## 2011-07-19 DIAGNOSIS — E876 Hypokalemia: Secondary | ICD-10-CM | POA: Diagnosis present

## 2011-07-19 LAB — COMPREHENSIVE METABOLIC PANEL
ALT: 82 U/L — ABNORMAL HIGH (ref 0–53)
AST: 49 U/L — ABNORMAL HIGH (ref 0–37)
Albumin: 3 g/dL — ABNORMAL LOW (ref 3.5–5.2)
Alkaline Phosphatase: 405 U/L — ABNORMAL HIGH (ref 39–117)
BUN: 25 mg/dL — ABNORMAL HIGH (ref 6–23)
Chloride: 97 mEq/L (ref 96–112)
Potassium: 3.9 mEq/L (ref 3.5–5.1)
Sodium: 141 mEq/L (ref 135–145)
Total Bilirubin: 0.4 mg/dL (ref 0.3–1.2)

## 2011-07-19 LAB — DIFFERENTIAL
Basophils Absolute: 0.1 10*3/uL (ref 0.0–0.1)
Lymphocytes Relative: 15 % (ref 12–46)
Monocytes Absolute: 1 10*3/uL (ref 0.1–1.0)
Monocytes Relative: 11 % (ref 3–12)
Neutro Abs: 6.5 10*3/uL (ref 1.7–7.7)

## 2011-07-19 LAB — CBC
HCT: 34.7 % — ABNORMAL LOW (ref 39.0–52.0)
Hemoglobin: 10.6 g/dL — ABNORMAL LOW (ref 13.0–17.0)
MCH: 29.9 pg (ref 26.0–34.0)
MCHC: 30.5 g/dL (ref 30.0–36.0)
RBC: 3.54 MIL/uL — ABNORMAL LOW (ref 4.22–5.81)

## 2011-07-19 LAB — GLUCOSE, CAPILLARY
Glucose-Capillary: 158 mg/dL — ABNORMAL HIGH (ref 70–99)
Glucose-Capillary: 211 mg/dL — ABNORMAL HIGH (ref 70–99)
Glucose-Capillary: 116 mg/dL — ABNORMAL HIGH (ref 70–99)

## 2011-07-19 LAB — SURGICAL PCR SCREEN: MRSA, PCR: NEGATIVE

## 2011-07-19 LAB — PROTIME-INR: Prothrombin Time: 13.5 seconds (ref 11.6–15.2)

## 2011-07-20 ENCOUNTER — Other Ambulatory Visit: Payer: Self-pay | Admitting: Orthopedic Surgery

## 2011-07-20 ENCOUNTER — Ambulatory Visit (HOSPITAL_COMMUNITY): Payer: Medicare Other

## 2011-07-20 LAB — RENAL FUNCTION PANEL
Albumin: 2.8 g/dL — ABNORMAL LOW (ref 3.5–5.2)
CO2: 27 mEq/L (ref 19–32)
Chloride: 98 mEq/L (ref 96–112)
GFR calc Af Amer: 7 mL/min — ABNORMAL LOW (ref >90)
GFR calc non Af Amer: 6 mL/min — ABNORMAL LOW (ref >90)
Potassium: 3.4 mEq/L — ABNORMAL LOW (ref 3.5–5.1)

## 2011-07-20 LAB — CBC
MCV: 97.4 fL (ref 78.0–100.0)
Platelets: 267 10*3/uL (ref 150–400)
RBC: 3.07 MIL/uL — ABNORMAL LOW (ref 4.22–5.81)
RDW: 17.9 % — ABNORMAL HIGH (ref 11.5–15.5)
WBC: 8.6 10*3/uL (ref 4.0–10.5)

## 2011-07-20 LAB — GLUCOSE, CAPILLARY: Glucose-Capillary: 165 mg/dL — ABNORMAL HIGH (ref 70–99)

## 2011-07-21 LAB — GLUCOSE, CAPILLARY
Glucose-Capillary: 54 mg/dL — ABNORMAL LOW (ref 70–99)
Glucose-Capillary: 95 mg/dL (ref 70–99)

## 2011-07-29 NOTE — Op Note (Signed)
  NAMERODMAN, RECUPERO NO.:  [id number]  MEDICAL RECORD NO.:  [id number]  LOCATION:  5020                         FACILITY:  [facility]  PHYSICIAN:  Nadara Mustard, MD     DATE OF BIRTH:  [birth year]  DATE OF PROCEDURE:  07/19/2011 DATE OF DISCHARGE:                              OPERATIVE REPORT   PREOPERATIVE DIAGNOSIS:  Gangrene, right forefoot.  POSTOPERATIVE DIAGNOSIS:  Gangrene, right forefoot.  PROCEDURE:  Right transmetatarsal amputation.  SURGEON:  Nadara Mustard, MD  ANESTHESIA:  Popliteal block.  ESTIMATED BLOOD LOSS:  Minimal.  DRAINS:  None.  COMPLICATIONS:  None.  TOURNIQUET TIME:  None.  ANTIBIOTICS:  Kefzol 1 g.  DRESSING:  Wound VAC applied, set to -75 mmHg, continuous suction.  DISPOSITION:  To PACU in stable condition.  INDICATIONS FOR PROCEDURE:  The patient is an 77 year old gentleman, diabetic end-stage renal disease, on dialysis, with a gangrene of the forefoot on the right foot.  The patient has failed conservative care. He is not a revascularization candidate and presents at this time for the transmetatarsal amputation.  Risks and benefits were discussed including infection, nonhealing of the wound, DVT, pulmonary embolus, need for higher level amputation.  The patient states he understands and wished to proceed at this time.  DESCRIPTION OF PROCEDURE:  The patient was brought to OR room 1 after a popliteal block.  After adequate level of anesthesia was obtained, the patient's right lower extremity was prepped using DuraPrep and draped into a sterile field and the necrotic toes were draped out of the surgical field with Ioban dressing.  A fishmouth incision was made through the midfoot.  This was carried down to a transmetatarsal amputation with the bone beveled plantarly.  The wound was irrigated with normal saline.  Hemostasis was obtained.  There was good petechial bleeding.  The incision was closed using 2-0 nylon.  The  wound was then covered with a wound VAC -75 mmHg suction.  Two nitroglycerin patches, 0.2 mg patches, one was placed on dorsalis pedis and one was placed on the posterior tibial artery location.  The patient tolerated procedure well and was discharged to recovery room in stable condition.  Plan for overnight observation.     Nadara Mustard, MD     MVD/MEDQ  D:  07/19/2011  T:  07/19/2011  Job:  [id number]  Electronically Signed by Aldean Baker MD on 07/29/2011 08:47:50 AM

## 2011-07-29 NOTE — Discharge Summary (Signed)
  [name], Steven Forbes NO.:  [id number]  MEDICAL RECORD NO.:  [id number]  LOCATION:  5020                         FACILITY:  [facility]  PHYSICIAN:  Nadara Mustard, MD     DATE OF BIRTH:  Jan 31, 1926  DATE OF ADMISSION:  07/19/2011 DATE OF DISCHARGE:  07/21/2011                              DISCHARGE SUMMARY   FINAL DIAGNOSES:  Gangrene right foot.  SURGICAL PROCEDURE: 1. Right transmetatarsal amputation. 2. Application of wound VAC.  DISCHARGE INSTRUCTIONS:  Discharged to home in stable condition.  Follow up in the office in 1 week.  Prescription for Vicodin for pain, as well as for nitroglycerin patch.  The patient will change the patch daily, continue his home medications as per his medical reconciliation form. We will set him up with Genevieve Norlander for home health physical therapy.  HOSPITAL COURSE:  Patient's hospital course is essentially unremarkable. He underwent dialysis during his hospital stay, was able to ambulate essentially independently with therapy.  They recommended home health physical therapy.  He has all the medical equipment he needs at home. Discharged home in stable condition.  Follow up in the office in 1 week.     Nadara Mustard, MD     MVD/MEDQ  D:  07/21/2011  T:  07/21/2011  Job:  [id number]  Electronically Signed by Aldean Baker MD on 07/29/2011 08:47:47 AM

## 2011-08-15 ENCOUNTER — Encounter: Payer: Self-pay | Admitting: Nephrology

## 2011-09-06 ENCOUNTER — Other Ambulatory Visit: Payer: Medicare Other

## 2011-09-06 ENCOUNTER — Ambulatory Visit: Payer: Medicare Other | Admitting: Vascular Surgery

## 2012-01-03 ENCOUNTER — Encounter (INDEPENDENT_AMBULATORY_CARE_PROVIDER_SITE_OTHER): Payer: Medicare Other | Admitting: Ophthalmology

## 2012-01-03 DIAGNOSIS — E11319 Type 2 diabetes mellitus with unspecified diabetic retinopathy without macular edema: Secondary | ICD-10-CM

## 2012-01-03 DIAGNOSIS — H35379 Puckering of macula, unspecified eye: Secondary | ICD-10-CM

## 2012-01-03 DIAGNOSIS — H43819 Vitreous degeneration, unspecified eye: Secondary | ICD-10-CM

## 2012-01-03 DIAGNOSIS — E1139 Type 2 diabetes mellitus with other diabetic ophthalmic complication: Secondary | ICD-10-CM

## 2012-01-03 DIAGNOSIS — E1165 Type 2 diabetes mellitus with hyperglycemia: Secondary | ICD-10-CM

## 2012-01-03 DIAGNOSIS — I10 Essential (primary) hypertension: Secondary | ICD-10-CM

## 2012-01-03 DIAGNOSIS — H35039 Hypertensive retinopathy, unspecified eye: Secondary | ICD-10-CM

## 2012-01-21 IMAGING — XA IR AV DIALYSIS SHUNT INTRO NEEDLE *L*
1 series · 12 of 24 positions shown · non-contrast
Comparison: none

CLINICAL HISTORY: Decreased access flows.

[Series 1: run · 12 of 97 slices shown]
[im 5/97]
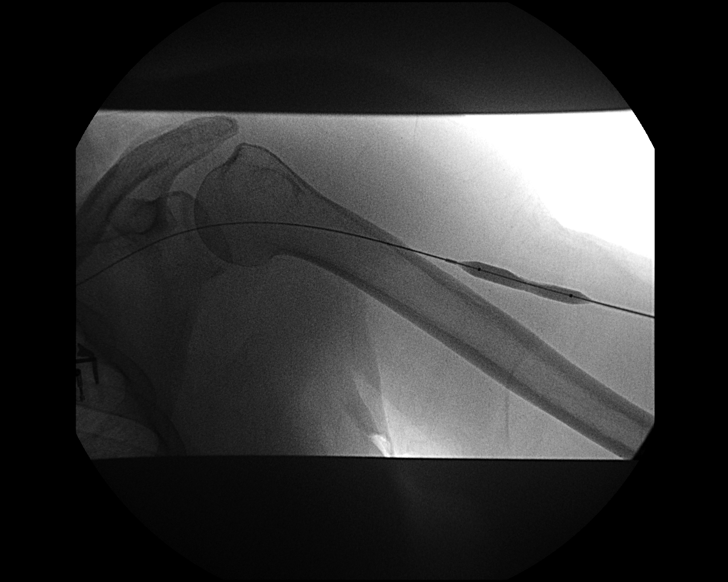
[im 13/97]
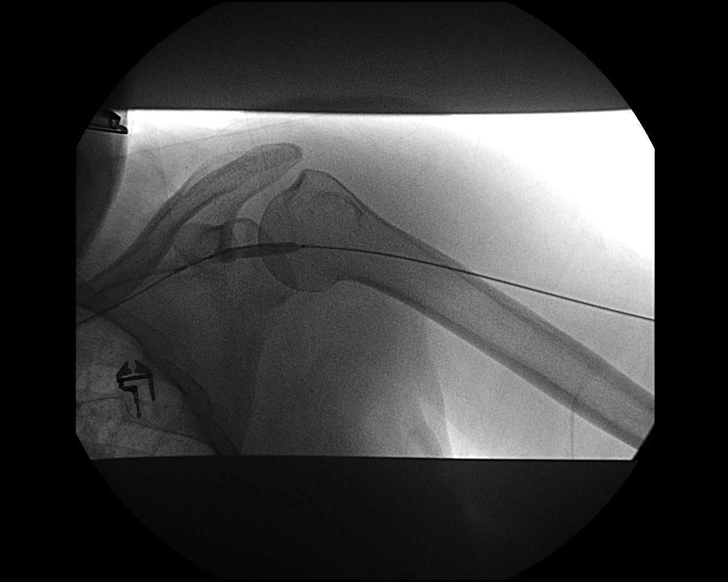
[im 21/97]
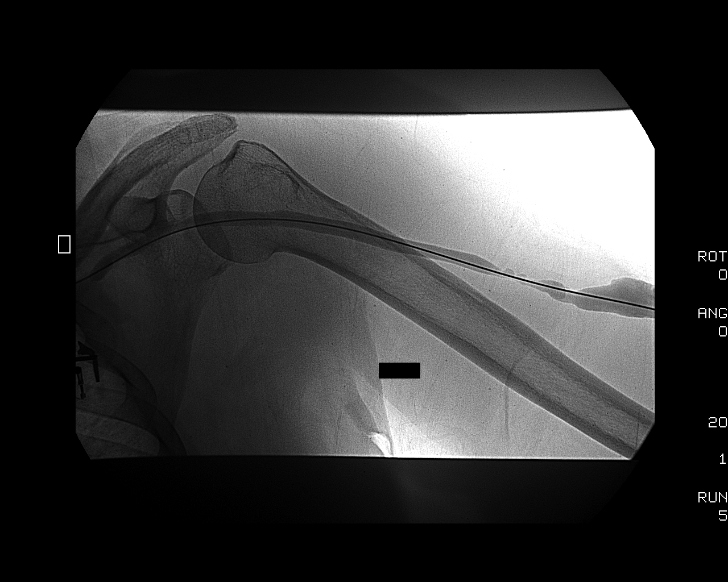
[im 30/97]
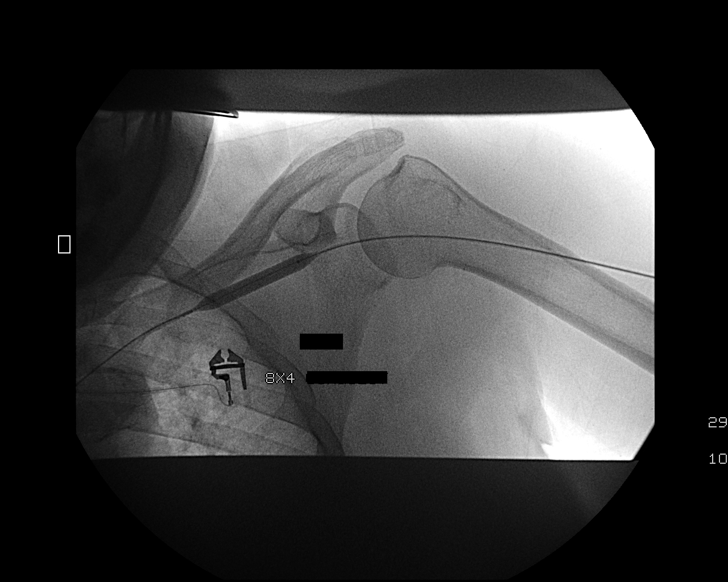
[im 38/97]
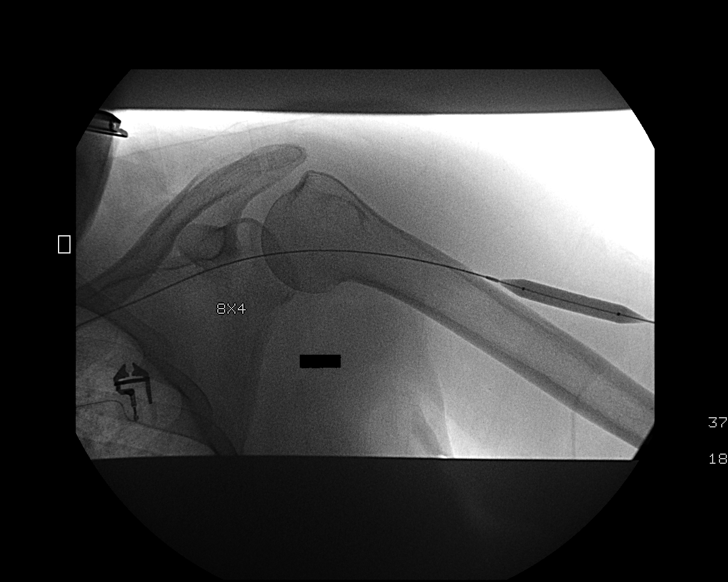
[im 46/97]
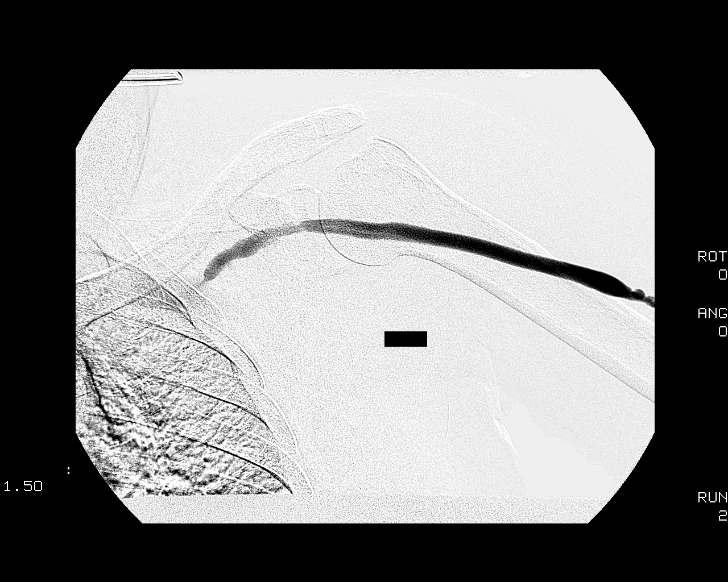
[im 55/97]
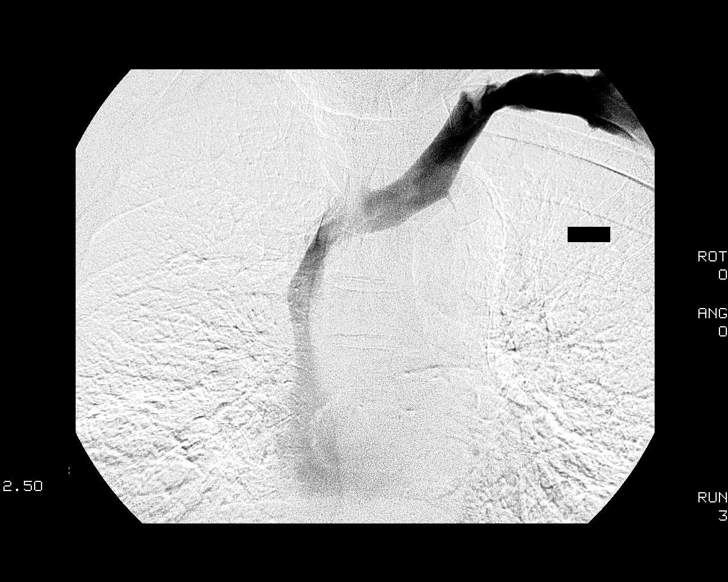
[im 63/97]
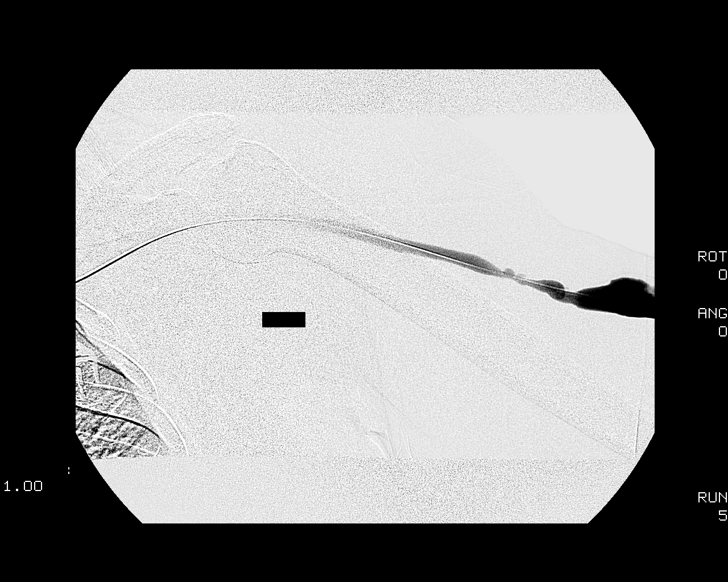
[im 71/97]
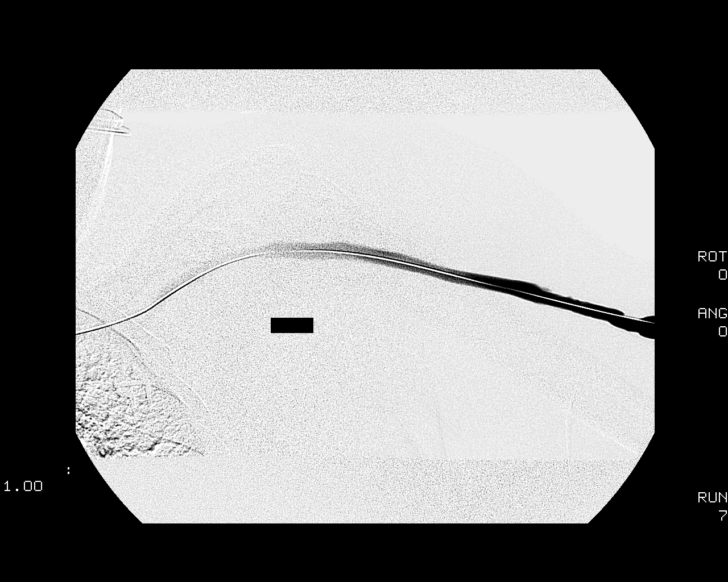
[im 80/97]
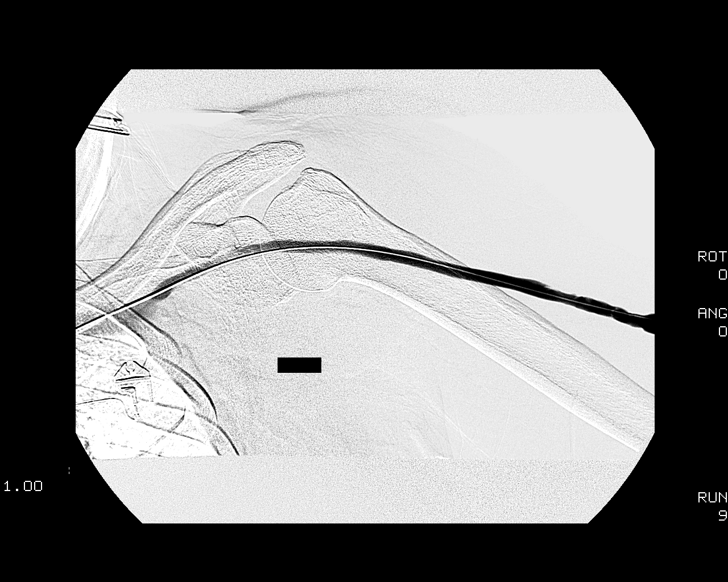
[im 88/97]
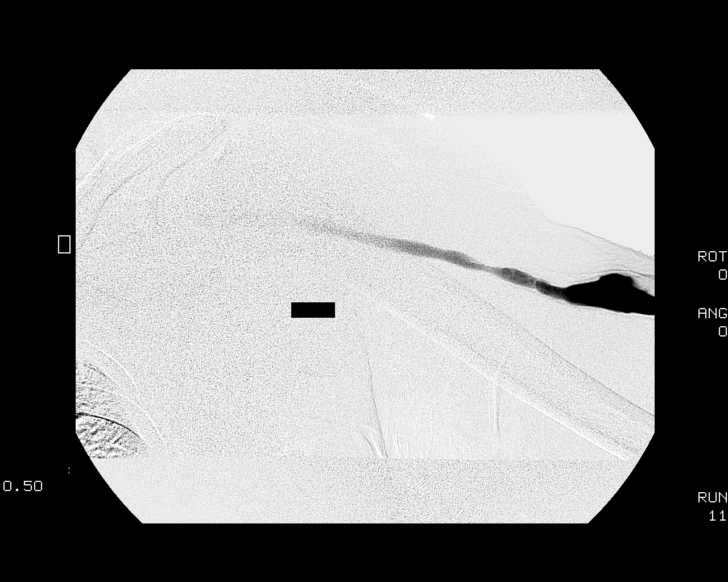
[im 97/97]
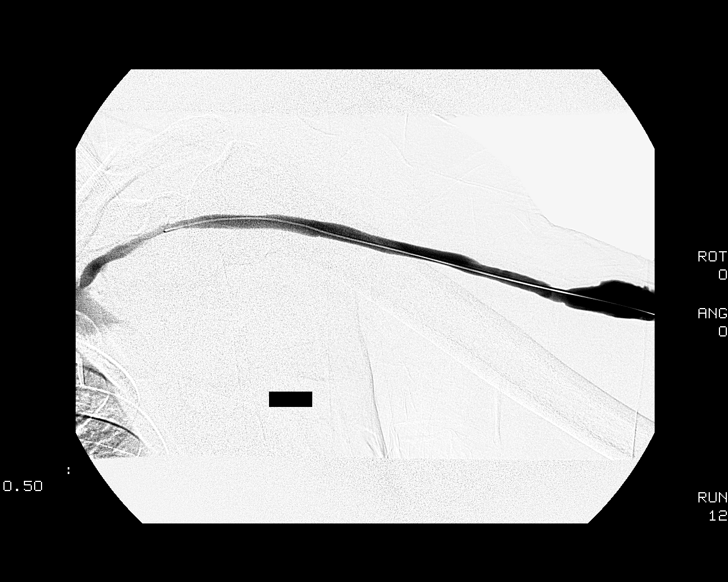

[12 of 24 positions shown; findings below may reference images not displayed]

PROCEDURE(S): LEFT UPPER EXTREMITY FISTULOGRAM; VENOUS PTA

Medications:None

 Moderate sedation time:None

Fluoroscopy time: 5.5 minutes

Contrast:  70 ml [id number]

Procedure:The procedure was explained to the patient.  The risks
and benefits of the procedure were discussed and the patient's
questions were addressed.  Informed consent was obtained from the
patient.  Left upper extremity cephalic vein fistula was accessed
with an angiocath.  Fistulogram images were obtained.  Arm was
prepped and draped in a sterile fashion.  Maximal barrier sterile
technique was utilized including caps, mask, sterile gowns, sterile
gloves, sterile drape, hand hygiene and skin antiseptic.  The skin
was anesthetized with 1% lidocaine.  The angiocath was exchanged
for a 6-French vascular sheath over a Bentson wire.  Two areas of
stenosis involving the draining cephalic vein were angioplastied
with a 7 mm x 40 mm Conquest balloon.  Follow-up fistulogram images
were obtained.  These areas were treated a second time with the 7
mm balloon.  The lesions was treated again with an 8 mm x 40 mm
Conquest balloon and a follow-up fistulogram was performed.  The
peripheral lesion was treated a final time with the 7 mm balloon.
Follow-up fistulogram images were obtained.  Vascular sheath was
removed with a pursestring suture.
FINDINGS: Left upper extremity cephalic vein fistula is patent.
There is an area of stenosis in the left upper arm and another area
of stenosis in the central cephalic vein near the left shoulder.
The arterial anastomosis and central veins are patent.  The areas
of stenosis are likely elastic lesions.  The irregularity of the
lesions markedly improved following balloon dilatation.  The lumen
of the central cephalic vein minimally changed after balloon
dilatation.  The lumen of the more peripheral cephalic vein did
improve following balloon angioplasty.
IMPRESSION: Segmental areas of stenosis involving the draining
cephalic vein.  The areas of stenosis responded to balloon
angioplasty.

## 2012-03-30 ENCOUNTER — Other Ambulatory Visit (HOSPITAL_COMMUNITY): Payer: Self-pay | Admitting: Nephrology

## 2012-03-30 DIAGNOSIS — N186 End stage renal disease: Secondary | ICD-10-CM

## 2012-04-05 ENCOUNTER — Ambulatory Visit (HOSPITAL_COMMUNITY)
Admission: RE | Admit: 2012-04-05 | Discharge: 2012-04-05 | Disposition: A | Discharge status: Home / Self Care | Payer: Medicare Other | Source: Ambulatory Visit | Attending: Nephrology | Admitting: Nephrology

## 2012-04-05 ENCOUNTER — Other Ambulatory Visit (HOSPITAL_COMMUNITY): Payer: Self-pay | Admitting: Nephrology

## 2012-04-05 DIAGNOSIS — N186 End stage renal disease: Secondary | ICD-10-CM

## 2012-04-05 DIAGNOSIS — I871 Compression of vein: Secondary | ICD-10-CM | POA: Insufficient documentation

## 2012-04-05 DIAGNOSIS — Y832 Surgical operation with anastomosis, bypass or graft as the cause of abnormal reaction of the patient, or of later complication, without mention of misadventure at the time of the procedure: Secondary | ICD-10-CM | POA: Insufficient documentation

## 2012-04-05 DIAGNOSIS — T82898A Other specified complication of vascular prosthetic devices, implants and grafts, initial encounter: Secondary | ICD-10-CM | POA: Insufficient documentation

## 2012-04-05 MED ORDER — IOHEXOL 300 MG/ML  SOLN
100.0000 mL | Freq: Once | INTRAMUSCULAR | Status: AC | PRN
  Administered 2012-04-05: 50 mL via INTRAVENOUS

## 2012-07-10 ENCOUNTER — Ambulatory Visit (INDEPENDENT_AMBULATORY_CARE_PROVIDER_SITE_OTHER): Payer: Medicare Other | Admitting: Ophthalmology

## 2012-07-10 DIAGNOSIS — H35379 Puckering of macula, unspecified eye: Secondary | ICD-10-CM

## 2012-07-10 DIAGNOSIS — E1139 Type 2 diabetes mellitus with other diabetic ophthalmic complication: Secondary | ICD-10-CM

## 2012-07-10 DIAGNOSIS — H35039 Hypertensive retinopathy, unspecified eye: Secondary | ICD-10-CM

## 2012-07-10 DIAGNOSIS — E11319 Type 2 diabetes mellitus with unspecified diabetic retinopathy without macular edema: Secondary | ICD-10-CM

## 2012-07-10 DIAGNOSIS — I10 Essential (primary) hypertension: Secondary | ICD-10-CM

## 2012-09-24 ENCOUNTER — Encounter: Payer: Self-pay | Admitting: Vascular Surgery

## 2013-01-10 ENCOUNTER — Ambulatory Visit (INDEPENDENT_AMBULATORY_CARE_PROVIDER_SITE_OTHER): Payer: Medicare Other | Admitting: Ophthalmology

## 2013-01-10 DIAGNOSIS — I10 Essential (primary) hypertension: Secondary | ICD-10-CM

## 2013-01-10 DIAGNOSIS — H35039 Hypertensive retinopathy, unspecified eye: Secondary | ICD-10-CM

## 2013-01-10 DIAGNOSIS — H35379 Puckering of macula, unspecified eye: Secondary | ICD-10-CM

## 2013-01-10 DIAGNOSIS — E1165 Type 2 diabetes mellitus with hyperglycemia: Secondary | ICD-10-CM

## 2013-01-10 DIAGNOSIS — E11319 Type 2 diabetes mellitus with unspecified diabetic retinopathy without macular edema: Secondary | ICD-10-CM

## 2013-01-10 DIAGNOSIS — E1139 Type 2 diabetes mellitus with other diabetic ophthalmic complication: Secondary | ICD-10-CM

## 2013-01-10 DIAGNOSIS — H43819 Vitreous degeneration, unspecified eye: Secondary | ICD-10-CM

## 2013-01-22 ENCOUNTER — Other Ambulatory Visit (HOSPITAL_COMMUNITY): Payer: Self-pay | Admitting: Nephrology

## 2013-01-22 DIAGNOSIS — N186 End stage renal disease: Secondary | ICD-10-CM

## 2013-01-29 ENCOUNTER — Ambulatory Visit (HOSPITAL_COMMUNITY)
Admission: RE | Admit: 2013-01-29 | Discharge: 2013-01-29 | Disposition: A | Discharge status: Home / Self Care | Payer: Medicare Other | Source: Ambulatory Visit | Attending: Nephrology | Admitting: Nephrology

## 2013-01-29 ENCOUNTER — Other Ambulatory Visit (HOSPITAL_COMMUNITY): Payer: Self-pay | Admitting: Nephrology

## 2013-01-29 DIAGNOSIS — Z992 Dependence on renal dialysis: Secondary | ICD-10-CM | POA: Insufficient documentation

## 2013-01-29 DIAGNOSIS — N186 End stage renal disease: Secondary | ICD-10-CM

## 2013-01-29 DIAGNOSIS — Y832 Surgical operation with anastomosis, bypass or graft as the cause of abnormal reaction of the patient, or of later complication, without mention of misadventure at the time of the procedure: Secondary | ICD-10-CM | POA: Insufficient documentation

## 2013-01-29 DIAGNOSIS — E785 Hyperlipidemia, unspecified: Secondary | ICD-10-CM | POA: Insufficient documentation

## 2013-01-29 DIAGNOSIS — I871 Compression of vein: Secondary | ICD-10-CM | POA: Insufficient documentation

## 2013-01-29 DIAGNOSIS — D649 Anemia, unspecified: Secondary | ICD-10-CM | POA: Insufficient documentation

## 2013-01-29 DIAGNOSIS — E119 Type 2 diabetes mellitus without complications: Secondary | ICD-10-CM | POA: Insufficient documentation

## 2013-01-29 DIAGNOSIS — I120 Hypertensive chronic kidney disease with stage 5 chronic kidney disease or end stage renal disease: Secondary | ICD-10-CM | POA: Insufficient documentation

## 2013-01-29 DIAGNOSIS — T82898A Other specified complication of vascular prosthetic devices, implants and grafts, initial encounter: Secondary | ICD-10-CM | POA: Insufficient documentation

## 2013-01-29 DIAGNOSIS — R011 Cardiac murmur, unspecified: Secondary | ICD-10-CM | POA: Insufficient documentation

## 2013-01-29 MED ORDER — IOHEXOL 300 MG/ML  SOLN
100.0000 mL | Freq: Once | INTRAMUSCULAR | Status: AC | PRN
  Administered 2013-01-29: 40 mL via INTRAVENOUS

## 2013-01-29 NOTE — H&P (Signed)
Steven Forbes is an 79 y.o. male.   Chief Complaint: Dec'd flows at dialysis HPI: 79 y/o AA M with h/o ESRD on dialysis now with dec'd Q via left upper arm AVF.  Initial fistulagram demonstrates several mild to moderate narrowings within the outflow cephalic vein and cephalic arch which are amendable to balloon angioplasty and possible stent placement.  Pt is without complaint.  No fever, chills, CP or SOB.  Past Medical History  Diagnosis Date  . Hyperlipidemia   . Anemia   . Diabetes mellitus   . Hypertension   . Chronic kidney disease     Dialysis on MONDAY, [facility] and [facility]    Past Surgical History  Procedure Laterality Date  . Laparoscopic gastrotomy w/ repair of ulcer    . Av fistula placement  2008    Left Upper Arm    No family history on file. Social History:  reports that he has never smoked. He does not have any smokeless tobacco history on file. He reports that he does not drink alcohol or use illicit drugs.  Allergies: No Known Allergies   (Not in a hospital admission)  No results found for this or any previous visit (from the past 48 hour(s)). No results found.  Review of Systems  Constitutional: Negative.   Respiratory: Negative.   Gastrointestinal: Negative.     There were no vitals taken for this visit. Physical Exam  Constitutional: He appears well-developed and well-nourished.  Cardiovascular: Normal rate.   Murmur heard. Respiratory: Effort normal and breath sounds normal.  Psychiatric: He has a normal mood and affect. His behavior is normal.     Assessment/Plan 79 y/o AA M with h/o ESRD on dialysis now with dec'd Q via left upper arm AVF.  Initial fistulagram demonstrates several mild to moderate narrowings within the outflow cephalic vein and cephalic arch which are amendable to balloon angioplasty and possible stent placement.    Informed written consent for fistula angioplasty with possible stent placement obtained after a discussion of  the benefits and risks (including but not limited to bleeding/vessel injury, infection, fistula thrombosis necessitating placement of an HD catheter.  [name] 01/29/2013, 8:27 AM

## 2013-01-29 NOTE — Procedures (Signed)
Technically successful fistulogram with angioplasty.  No immediate complications.   

## 2013-07-13 IMAGING — CR DG ANG/EXT/UNI/OR RIGHT
1 series · 1 of 1 positions shown · non-contrast
Comparison: None.

CLINICAL DATA: Right lower extremity bypass graft surgery.

RIGHT EXTREMITY ARTERIOGRAPHY

[view not recorded]
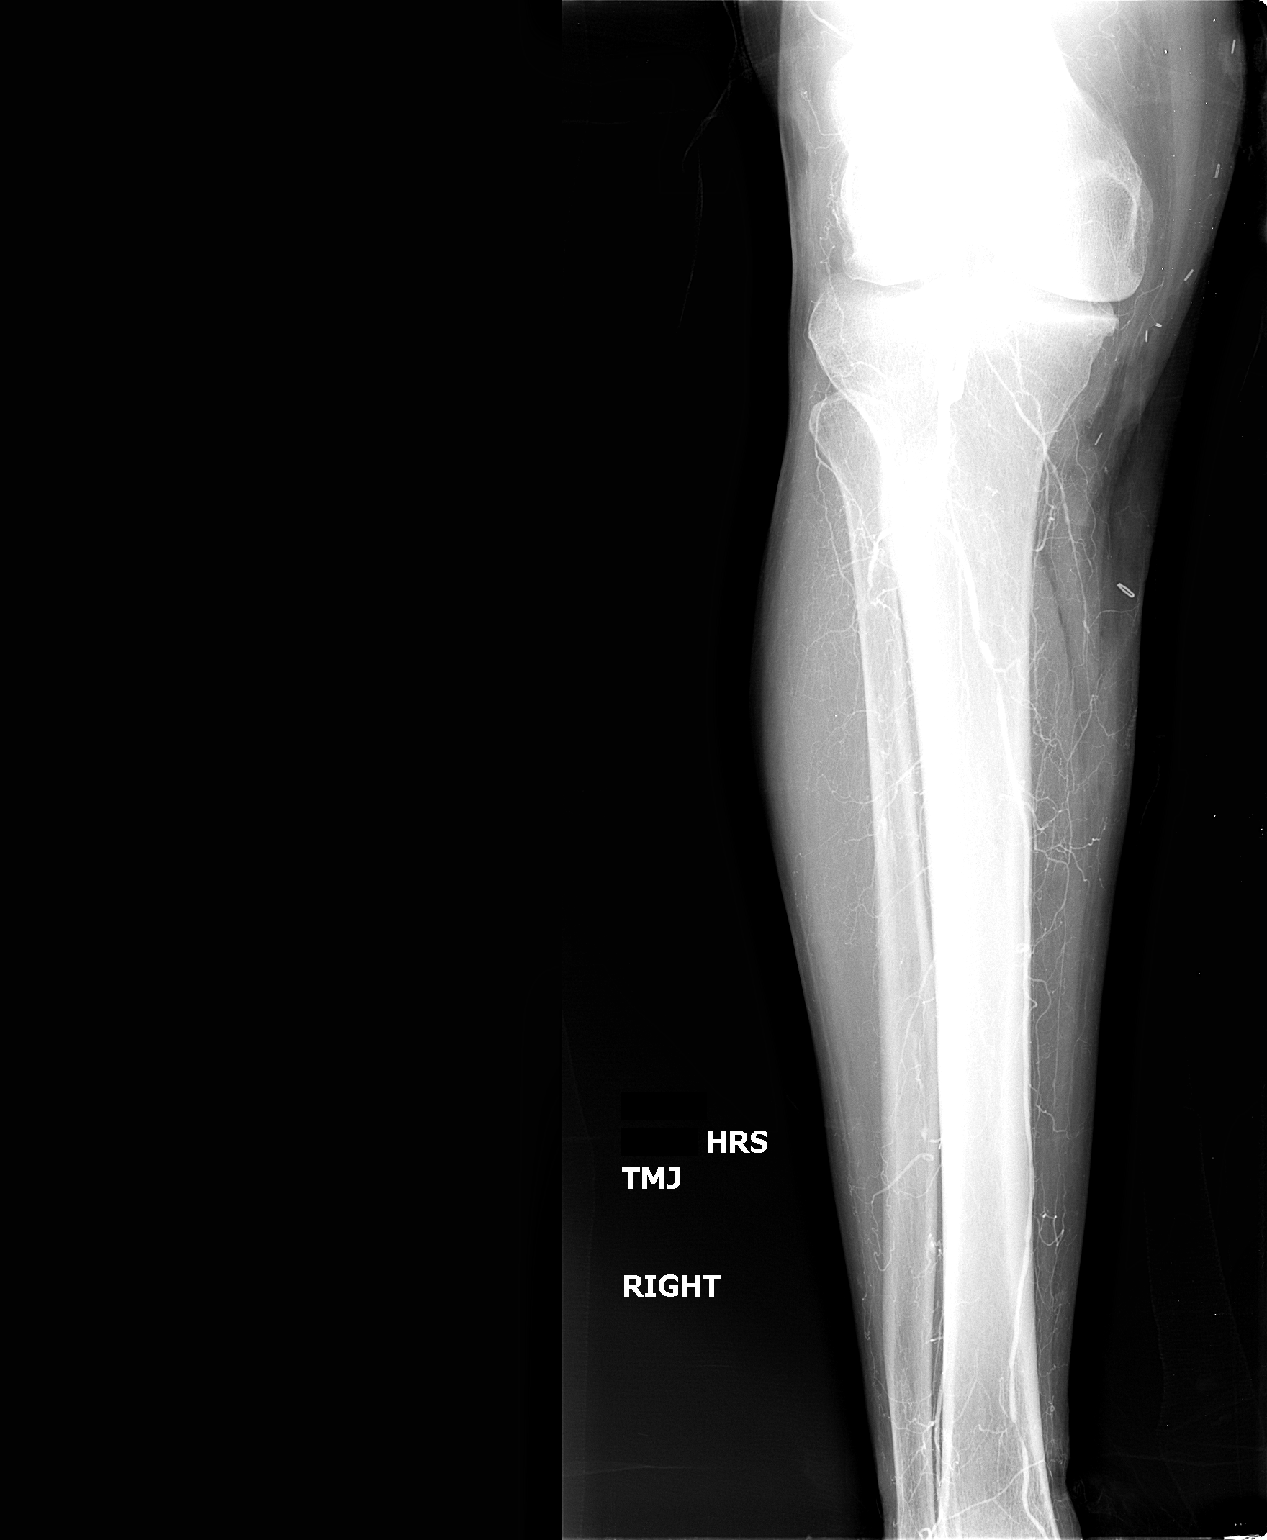

[1 of 1 positions shown; findings below may reference images not displayed]

FINDINGS: Bypass graft visualized which is patent and demonstrates
anastomosis to the popliteal artery below the knee.  Diseased
posterior tibial and peroneal runoff visualized.
IMPRESSION: Patent bypass graft with anastomosis to the popliteal artery below
the knee.

## 2013-07-16 ENCOUNTER — Ambulatory Visit (INDEPENDENT_AMBULATORY_CARE_PROVIDER_SITE_OTHER): Payer: Medicare Other | Admitting: Ophthalmology

## 2013-07-16 DIAGNOSIS — E1139 Type 2 diabetes mellitus with other diabetic ophthalmic complication: Secondary | ICD-10-CM

## 2013-07-16 DIAGNOSIS — H43819 Vitreous degeneration, unspecified eye: Secondary | ICD-10-CM

## 2013-07-16 DIAGNOSIS — E11319 Type 2 diabetes mellitus with unspecified diabetic retinopathy without macular edema: Secondary | ICD-10-CM

## 2013-07-16 DIAGNOSIS — I10 Essential (primary) hypertension: Secondary | ICD-10-CM

## 2013-07-16 DIAGNOSIS — H35039 Hypertensive retinopathy, unspecified eye: Secondary | ICD-10-CM

## 2013-09-06 ENCOUNTER — Emergency Department (HOSPITAL_COMMUNITY)
Admission: EM | Admit: 2013-09-06 | Discharge: 2013-09-06 | Disposition: A | Discharge status: Home / Self Care | Payer: Medicare Other | Attending: Emergency Medicine | Admitting: Emergency Medicine

## 2013-09-06 ENCOUNTER — Encounter (HOSPITAL_COMMUNITY): Payer: Self-pay | Admitting: Emergency Medicine

## 2013-09-06 DIAGNOSIS — E785 Hyperlipidemia, unspecified: Secondary | ICD-10-CM | POA: Insufficient documentation

## 2013-09-06 DIAGNOSIS — Z79899 Other long term (current) drug therapy: Secondary | ICD-10-CM | POA: Insufficient documentation

## 2013-09-06 DIAGNOSIS — E119 Type 2 diabetes mellitus without complications: Secondary | ICD-10-CM | POA: Insufficient documentation

## 2013-09-06 DIAGNOSIS — N186 End stage renal disease: Secondary | ICD-10-CM | POA: Insufficient documentation

## 2013-09-06 DIAGNOSIS — Z794 Long term (current) use of insulin: Secondary | ICD-10-CM | POA: Insufficient documentation

## 2013-09-06 DIAGNOSIS — D649 Anemia, unspecified: Secondary | ICD-10-CM | POA: Insufficient documentation

## 2013-09-06 DIAGNOSIS — I120 Hypertensive chronic kidney disease with stage 5 chronic kidney disease or end stage renal disease: Secondary | ICD-10-CM | POA: Insufficient documentation

## 2013-09-06 DIAGNOSIS — R000 Tachycardia, unspecified: Secondary | ICD-10-CM

## 2013-09-06 DIAGNOSIS — Z992 Dependence on renal dialysis: Secondary | ICD-10-CM | POA: Insufficient documentation

## 2013-09-06 NOTE — ED Notes (Signed)
Pt from dialysis; dialysis RN reported pt had bradycardia episode then tachycardia episode 130's. Finished tx. Took out 3800 and put back 400. No complaints at this time.

## 2013-09-06 NOTE — ED Notes (Signed)
IV team in with patient at this time to de access dialysis catheter.

## 2013-09-06 NOTE — ED Notes (Signed)
Fistula still accessed.

## 2013-09-06 NOTE — ED Provider Notes (Signed)
CSN: [id number]     Arrival date & time 09/06/13  1726 History   First MD Initiated Contact with Patient 09/06/13 1727     Chief Complaint  Patient presents with  . Tachycardia   (Consider location/radiation/quality/duration/timing/severity/associated sxs/prior Treatment) HPI Pt with history of ESRD on HD was brought to the ED via EMS for reports of tachycardia near the end of his dialysis run. On EMS arrival he was NSR with HR in the 80s. Pt has no complaints, unsure why he is here. No recent illness or fever.  Past Medical History  Diagnosis Date  . Hyperlipidemia   . Anemia   . Diabetes mellitus   . Hypertension   . Chronic kidney disease     Dialysis on MONDAY, [facility] and [facility]   Past Surgical History  Procedure Laterality Date  . Laparoscopic gastrotomy w/ repair of ulcer    . Av fistula placement  2008    Left Upper Arm   History reviewed. No pertinent family history. History  Substance Use Topics  . Smoking status: Never Smoker   . Smokeless tobacco: Not on file  . Alcohol Use: No    Review of Systems All other systems reviewed and are negative except as noted in HPI.   Allergies  Review of patient's allergies indicates no known allergies.  Home Medications   Current Outpatient Rx  Name  Route  Sig  Dispense  Refill  . clindamycin (CLEOCIN) 150 MG capsule   Oral   Take 150 mg by mouth 4 (four) times daily.           [name] doxycycline (VIBRAMYCIN) 100 MG capsule   Oral   Take 100 mg by mouth 2 (two) times daily.           . folic acid-vitamin b complex-vitamin c-selenium-zinc (DIALYVITE) 3 MG TABS   Oral   Take 1 tablet by mouth daily.           . Insulin Isophane Human (HUMULIN N PEN [place])   Subcutaneous   Inject 12 Units into the skin 2 (two) times daily.           [name] labetalol (NORMODYNE) 300 MG tablet   Oral   Take 300 mg by mouth 3 (three) times daily.          [name] omeprazole (PRILOSEC) 20 MG capsule   Oral   Take 20 mg by mouth  daily.           . simvastatin (ZOCOR) 40 MG tablet   Oral   Take 40 mg by mouth at bedtime.            BP 131/55  Pulse 83  Temp(Src) 98.2 F (36.8 C) (Oral)  Resp 18  SpO2 98% Physical Exam  Nursing note and vitals reviewed. Constitutional: He is oriented to person, place, and time. He appears well-developed and well-nourished.  HENT:  Head: Normocephalic and atraumatic.  Eyes: EOM are normal. Pupils are equal, round, and reactive to light.  Neck: Normal range of motion. Neck supple.  Cardiovascular: Normal rate, normal heart sounds and intact distal pulses.   Pulmonary/Chest: Effort normal and breath sounds normal.  Abdominal: Bowel sounds are normal. He exhibits no distension. There is no tenderness.  Musculoskeletal: Normal range of motion. He exhibits no edema and no tenderness.  Dialysis graft still accessed  Neurological: He is alert and oriented to person, place, and time. He has normal strength. No cranial nerve deficit or sensory deficit.  Skin: Skin is warm and dry. No rash noted.  Psychiatric: He has a normal mood and affect.    ED Course  Procedures (including critical care time) Labs Review Labs Reviewed - No data to display Imaging Review No results found.  EKG Interpretation    Date/Time:  Friday September 06 2013 17:40:09 EST Ventricular Rate:  83 PR Interval:  150 QRS Duration: 93 QT Interval:  404 QTC Calculation: 475 R Axis:   -2 Text Interpretation:  Sinus rhythm Borderline low voltage, extremity leads Baseline wander in lead(s) I III aVL aVF No significant change since last tracing Confirmed by SHELDON  MD, CHARLES ([id number]) on 09/06/2013 6:06:27 PM            MDM   1. Tachycardia     Pt asymptomatic with normal HR now. Will continue to observe in the ED until we can get his dialysis de-accessed. Plan discharge if no change during that time.   Charles B. Bernette Mayers, MD 09/06/13 [phone]

## 2013-09-06 NOTE — ED Notes (Signed)
IV team notified of need to de-access patients dialysis shunt, states they will be down as soon as they can.

## 2013-09-08 ENCOUNTER — Encounter (HOSPITAL_COMMUNITY): Payer: Self-pay | Admitting: Emergency Medicine

## 2013-09-08 ENCOUNTER — Inpatient Hospital Stay (HOSPITAL_COMMUNITY)
Admission: EM | Admit: 2013-09-08 | Discharge: 2013-09-13 | DRG: 280 | Disposition: A | Discharge status: Home / Self Care | Payer: Medicare Other | Attending: Internal Medicine | Admitting: Internal Medicine

## 2013-09-08 DIAGNOSIS — E785 Hyperlipidemia, unspecified: Secondary | ICD-10-CM | POA: Diagnosis present

## 2013-09-08 DIAGNOSIS — Z794 Long term (current) use of insulin: Secondary | ICD-10-CM

## 2013-09-08 DIAGNOSIS — M899 Disorder of bone, unspecified: Secondary | ICD-10-CM | POA: Diagnosis present

## 2013-09-08 DIAGNOSIS — E43 Unspecified severe protein-calorie malnutrition: Secondary | ICD-10-CM

## 2013-09-08 DIAGNOSIS — I5031 Acute diastolic (congestive) heart failure: Secondary | ICD-10-CM | POA: Diagnosis present

## 2013-09-08 DIAGNOSIS — Z992 Dependence on renal dialysis: Secondary | ICD-10-CM

## 2013-09-08 DIAGNOSIS — Z860100 Personal history of colon polyps, unspecified: Secondary | ICD-10-CM

## 2013-09-08 DIAGNOSIS — J101 Influenza due to other identified influenza virus with other respiratory manifestations: Secondary | ICD-10-CM | POA: Diagnosis present

## 2013-09-08 DIAGNOSIS — R0603 Acute respiratory distress: Secondary | ICD-10-CM

## 2013-09-08 DIAGNOSIS — I509 Heart failure, unspecified: Secondary | ICD-10-CM

## 2013-09-08 DIAGNOSIS — N2581 Secondary hyperparathyroidism of renal origin: Secondary | ICD-10-CM

## 2013-09-08 DIAGNOSIS — I214 Non-ST elevation (NSTEMI) myocardial infarction: Principal | ICD-10-CM

## 2013-09-08 DIAGNOSIS — I2510 Atherosclerotic heart disease of native coronary artery without angina pectoris: Secondary | ICD-10-CM | POA: Diagnosis present

## 2013-09-08 DIAGNOSIS — N186 End stage renal disease: Secondary | ICD-10-CM

## 2013-09-08 DIAGNOSIS — I70209 Unspecified atherosclerosis of native arteries of extremities, unspecified extremity: Secondary | ICD-10-CM

## 2013-09-08 DIAGNOSIS — A419 Sepsis, unspecified organism: Secondary | ICD-10-CM

## 2013-09-08 DIAGNOSIS — D649 Anemia, unspecified: Secondary | ICD-10-CM

## 2013-09-08 DIAGNOSIS — J189 Pneumonia, unspecified organism: Secondary | ICD-10-CM

## 2013-09-08 DIAGNOSIS — K648 Other hemorrhoids: Secondary | ICD-10-CM

## 2013-09-08 DIAGNOSIS — E119 Type 2 diabetes mellitus without complications: Secondary | ICD-10-CM

## 2013-09-08 DIAGNOSIS — D131 Benign neoplasm of stomach: Secondary | ICD-10-CM

## 2013-09-08 DIAGNOSIS — J111 Influenza due to unidentified influenza virus with other respiratory manifestations: Secondary | ICD-10-CM | POA: Diagnosis present

## 2013-09-08 DIAGNOSIS — R7401 Elevation of levels of liver transaminase levels: Secondary | ICD-10-CM

## 2013-09-08 DIAGNOSIS — I120 Hypertensive chronic kidney disease with stage 5 chronic kidney disease or end stage renal disease: Secondary | ICD-10-CM | POA: Diagnosis present

## 2013-09-08 DIAGNOSIS — Z79899 Other long term (current) drug therapy: Secondary | ICD-10-CM

## 2013-09-08 NOTE — ED Notes (Signed)
Patients family called EMS for "not acting like himself." EMS states patient was hot to touch, and wheezing in right upper and lower lung fields. Patient has a non-productive cough x2-3 days. Patient is 94% on RA and 100% on ned treatment. EMS gave 10mg  of Albuterol and 0.5 mg of Atrovent. CBG 80. BP 118/60 and HR 90. Patient is a [facility] dialysis patient.

## 2013-09-08 NOTE — ED Provider Notes (Addendum)
CSN: [id number]     Arrival date & time 09/08/13  2334 History   First MD Initiated Contact with Patient 09/08/13 2337     Chief Complaint  Patient presents with  . Shortness of Breath   (Consider location/radiation/quality/duration/timing/severity/associated sxs/prior Treatment) HPI The patient is a very pleasant elderly man with end-stage renal disease who dialyzes Mondays, Wednesdays and Fridays. He is brought to the emergency department from home with fever and respiratory distress. The patient has had a productive cough for the last several days. He developed increased work of breathing and shortness of breath tonight.  The paramedics arrived at the house, the found room air oxygen saturation 94%. The patient was placed on a duo neb sats improved to 99%. The patient was found to be febrile with a temp of 100.2 Fahrenheit her oral birth, mother in the emergency department. The patient reports compliance with dialysis and had a full run 2 days ago.   Denies chest pain, abdominal pain.   Past Medical History  Diagnosis Date  . Hyperlipidemia   . Anemia   . Diabetes mellitus   . Hypertension   . Chronic kidney disease     Dialysis on MONDAY, [facility] and [facility]   Past Surgical History  Procedure Laterality Date  . Laparoscopic gastrotomy w/ repair of ulcer    . Av fistula placement  2008    Left Upper Arm   No family history on file. History  Substance Use Topics  . Smoking status: Never Smoker   . Smokeless tobacco: Not on file  . Alcohol Use: No    Review of Systems Ten point review of symptoms performed and is negative with the exception of symptoms noted above.   Allergies  Review of patient's allergies indicates no known allergies.  Home Medications   Current Outpatient Rx  Name  Route  Sig  Dispense  Refill  . clindamycin (CLEOCIN) 150 MG capsule   Oral   Take 150 mg by mouth 4 (four) times daily.           [name] doxycycline (VIBRAMYCIN) 100 MG capsule  Oral   Take 100 mg by mouth 2 (two) times daily.           . folic acid-vitamin b complex-vitamin c-selenium-zinc (DIALYVITE) 3 MG TABS   Oral   Take 1 tablet by mouth daily.           . Insulin Isophane Human (HUMULIN N PEN [facility])   Subcutaneous   Inject 12 Units into the skin 2 (two) times daily.           [name] labetalol (NORMODYNE) 300 MG tablet   Oral   Take 300 mg by mouth 3 (three) times daily.          [name] omeprazole (PRILOSEC) 20 MG capsule   Oral   Take 20 mg by mouth daily.           . simvastatin (ZOCOR) 40 MG tablet   Oral   Take 40 mg by mouth at bedtime.            There were no vitals taken for this visit. Physical Exam Gen: well developed and well nourished appearing, appears in respiratory distress Head: NCAT Eyes: PERL, EOMI Nose: no epistaixis or rhinorrhea Mouth/throat: mucosa is moist and pink Neck: supple, no stridor Lungs: RR 36/min, ronchorous BS both bases,  CV: rapid and regular, 2/6 systolic murmur, palp radial pulses bilaterally Abd: soft, notender, nondistended Back: no ttp,  no cva ttp, marked kyphosis Skin: warm and dry Ext: fistula/graft left upper arm with good thrill, s/p transmetatarsal amputation right foot. No edema.  Neuro: CN ii-xii grossly intact, no focal deficits Psyche; normal affect,  calm and cooperative.   ED Course  Procedures (including critical care time) Labs Review  Results for orders placed during the hospital encounter of 09/08/13 (from the past 24 hour(s))  LACTIC ACID, PLASMA     Status: None   Collection Time    09/09/13 12:06 AM      Result Value Range   Lactic Acid, Venous 1.3  0.5 - 2.2 mmol/L  BASIC METABOLIC PANEL     Status: Abnormal   Collection Time    09/09/13 12:06 AM      Result Value Range   Sodium 136  135 - 145 mEq/L   Potassium 3.2 (*) 3.5 - 5.1 mEq/L   Chloride 93 (*) 96 - 112 mEq/L   CO2 27  19 - 32 mEq/L   Glucose, Bld 80  70 - 99 mg/dL   BUN 38 (*) 6 - 23 mg/dL   Creatinine, Ser  9.56 (*) 0.50 - 1.35 mg/dL   Calcium 8.7  8.4 - 21.3 mg/dL   GFR calc non Af Amer 7 (*) >90 mL/min   GFR calc Af Amer 8 (*) >90 mL/min  CBC WITH DIFFERENTIAL     Status: Abnormal   Collection Time    09/09/13 12:06 AM      Result Value Range   WBC 14.6 (*) 4.0 - 10.5 K/uL   RBC 3.24 (*) 4.22 - 5.81 MIL/uL   Hemoglobin 10.0 (*) 13.0 - 17.0 g/dL   HCT 08.6 (*) 57.8 - 46.9 %   MCV 92.6  78.0 - 100.0 fL   MCH 30.9  26.0 - 34.0 pg   MCHC 33.3  30.0 - 36.0 g/dL   RDW 62.9 (*) 52.8 - 41.3 %   Platelets 216  150 - 400 K/uL   Neutrophils Relative % 89 (*) 43 - 77 %   Neutro Abs 13.0 (*) 1.7 - 7.7 K/uL   Lymphocytes Relative 4 (*) 12 - 46 %   Lymphs Abs 0.6 (*) 0.7 - 4.0 K/uL   Monocytes Relative 7  3 - 12 %   Monocytes Absolute 1.0  0.1 - 1.0 K/uL   Eosinophils Relative 0  0 - 5 %   Eosinophils Absolute 0.0  0.0 - 0.7 K/uL   Basophils Relative 0  0 - 1 %   Basophils Absolute 0.0  0.0 - 0.1 K/uL  GLUCOSE, CAPILLARY     Status: None   Collection Time    09/09/13 12:21 AM      Result Value Range   Glucose-Capillary 80  70 - 99 mg/dL  POCT I-STAT TROPONIN I     Status: Abnormal   Collection Time    09/09/13  1:18 AM      Result Value Range   Troponin i, poc 1.40 (*) 0.00 - 0.08 ng/mL   Comment NOTIFIED PHYSICIAN     Comment 3            CXR: cardiomegaly with pulmonary vascular congestion, no discrete infiltrate.   CRITICAL CARE Performed by: Brandt Loosen   Total critical care time: 60m  Critical care time was exclusive of separately billable procedures and treating other patients.  Critical care was necessary to treat or prevent imminent or life-threatening deterioration.  Critical care was time spent personally by me  on the following activities: development of treatment plan with patient and/or surrogate as well as nursing, discussions with consultants, evaluation of patient's response to treatment, examination of patient, obtaining history from patient or surrogate, ordering  and performing treatments and interventions, ordering and review of laboratory studies, ordering and review of radiographic studies, pulse oximetry and re-evaluation of patient's condition.     MDM  Patient with suspected underlying early pneumonia. We are treating with Vanc and Natasha Bence. Respiratory status significantly improved after tx with Albuterol. WBC noted to be elevated at 15,000. Patient has a mildly elevated i-STAT troponin I to 1.4. This is likely a false positive in light of the patient's chronic kidney disease. We will check a CK with MB fraction as well as the traditional troponin. Case discussed with Dr. Craig Guess who will do the patient to the telemetry unit. He will also consult cardiology service. We will treat the patient with aspirin. At this point, his vital signs have normalized and his sats are 100% on 2 L of oxygen via nasal cannula.   Brandt Loosen, MD 09/09/13 0454  Brandt Loosen, MD 09/09/13 [phone]

## 2013-09-09 ENCOUNTER — Inpatient Hospital Stay (HOSPITAL_COMMUNITY): Payer: Medicare Other

## 2013-09-09 ENCOUNTER — Encounter (HOSPITAL_COMMUNITY): Payer: Self-pay | Admitting: General Practice

## 2013-09-09 ENCOUNTER — Emergency Department (HOSPITAL_COMMUNITY): Payer: Medicare Other

## 2013-09-09 DIAGNOSIS — R7989 Other specified abnormal findings of blood chemistry: Secondary | ICD-10-CM

## 2013-09-09 DIAGNOSIS — D649 Anemia, unspecified: Secondary | ICD-10-CM

## 2013-09-09 DIAGNOSIS — J111 Influenza due to unidentified influenza virus with other respiratory manifestations: Secondary | ICD-10-CM

## 2013-09-09 DIAGNOSIS — N186 End stage renal disease: Secondary | ICD-10-CM | POA: Diagnosis present

## 2013-09-09 DIAGNOSIS — E119 Type 2 diabetes mellitus without complications: Secondary | ICD-10-CM

## 2013-09-09 DIAGNOSIS — I509 Heart failure, unspecified: Secondary | ICD-10-CM | POA: Diagnosis present

## 2013-09-09 DIAGNOSIS — I214 Non-ST elevation (NSTEMI) myocardial infarction: Secondary | ICD-10-CM | POA: Diagnosis present

## 2013-09-09 DIAGNOSIS — A419 Sepsis, unspecified organism: Secondary | ICD-10-CM

## 2013-09-09 DIAGNOSIS — J101 Influenza due to other identified influenza virus with other respiratory manifestations: Secondary | ICD-10-CM | POA: Diagnosis present

## 2013-09-09 DIAGNOSIS — I70209 Unspecified atherosclerosis of native arteries of extremities, unspecified extremity: Secondary | ICD-10-CM | POA: Diagnosis present

## 2013-09-09 DIAGNOSIS — J189 Pneumonia, unspecified organism: Secondary | ICD-10-CM | POA: Diagnosis present

## 2013-09-09 DIAGNOSIS — N2581 Secondary hyperparathyroidism of renal origin: Secondary | ICD-10-CM | POA: Diagnosis present

## 2013-09-09 DIAGNOSIS — R0603 Acute respiratory distress: Secondary | ICD-10-CM | POA: Insufficient documentation

## 2013-09-09 LAB — COMPREHENSIVE METABOLIC PANEL
ALT: 109 U/L — ABNORMAL HIGH (ref 0–53)
AST: 139 U/L — ABNORMAL HIGH (ref 0–37)
Alkaline Phosphatase: 414 U/L — ABNORMAL HIGH (ref 39–117)
CO2: 25 mEq/L (ref 19–32)
GFR calc Af Amer: 8 mL/min — ABNORMAL LOW (ref >90)
GFR calc non Af Amer: 7 mL/min — ABNORMAL LOW (ref >90)
Glucose, Bld: 125 mg/dL — ABNORMAL HIGH (ref 70–99)
Potassium: 3.7 mEq/L (ref 3.5–5.1)
Sodium: 135 mEq/L (ref 135–145)
Total Protein: 7 g/dL (ref 6.0–8.3)

## 2013-09-09 LAB — HEMOGLOBIN A1C: Hgb A1c MFr Bld: 6.2 % — ABNORMAL HIGH (ref <5.7)

## 2013-09-09 LAB — CBC WITH DIFFERENTIAL/PLATELET
Basophils Relative: 0 % (ref 0–1)
Eosinophils Absolute: 0 10*3/uL (ref 0.0–0.7)
Eosinophils Relative: 0 % (ref 0–5)
Hemoglobin: 10 g/dL — ABNORMAL LOW (ref 13.0–17.0)
Lymphs Abs: 0.6 10*3/uL — ABNORMAL LOW (ref 0.7–4.0)
MCH: 30.9 pg (ref 26.0–34.0)
MCHC: 33.3 g/dL (ref 30.0–36.0)
MCV: 92.6 fL (ref 78.0–100.0)
Neutrophils Relative %: 89 % — ABNORMAL HIGH (ref 43–77)
Platelets: 216 10*3/uL (ref 150–400)
RBC: 3.24 MIL/uL — ABNORMAL LOW (ref 4.22–5.81)
RDW: 17.7 % — ABNORMAL HIGH (ref 11.5–15.5)

## 2013-09-09 LAB — PROTIME-INR
INR: 1.24 (ref 0.00–1.49)
Prothrombin Time: 15.3 seconds — ABNORMAL HIGH (ref 11.6–15.2)

## 2013-09-09 LAB — POCT I-STAT 3, VENOUS BLOOD GAS (G3P V)
Acid-Base Excess: 3 mmol/L — ABNORMAL HIGH (ref 0.0–2.0)
O2 Saturation: 84 %
Patient temperature: 100.8
pH, Ven: 7.384 — ABNORMAL HIGH (ref 7.250–7.300)
pO2, Ven: 54 mmHg — ABNORMAL HIGH (ref 30.0–45.0)

## 2013-09-09 LAB — BASIC METABOLIC PANEL
BUN: 38 mg/dL — ABNORMAL HIGH (ref 6–23)
CO2: 27 mEq/L (ref 19–32)
Calcium: 8.7 mg/dL (ref 8.4–10.5)
Creatinine, Ser: 6.52 mg/dL — ABNORMAL HIGH (ref 0.50–1.35)
GFR calc non Af Amer: 7 mL/min — ABNORMAL LOW (ref >90)
Glucose, Bld: 80 mg/dL (ref 70–99)
Potassium: 3.2 mEq/L — ABNORMAL LOW (ref 3.5–5.1)

## 2013-09-09 LAB — CBC
HCT: 30.4 % — ABNORMAL LOW (ref 39.0–52.0)
MCH: 30.5 pg (ref 26.0–34.0)
MCHC: 32.9 g/dL (ref 30.0–36.0)
RDW: 17.8 % — ABNORMAL HIGH (ref 11.5–15.5)
WBC: 16.7 10*3/uL — ABNORMAL HIGH (ref 4.0–10.5)

## 2013-09-09 LAB — PRO B NATRIURETIC PEPTIDE: Pro B Natriuretic peptide (BNP): >70000 pg/mL — ABNORMAL HIGH (ref 0–450)

## 2013-09-09 LAB — LACTIC ACID, PLASMA: Lactic Acid, Venous: 1.3 mmol/L (ref 0.5–2.2)

## 2013-09-09 LAB — POCT I-STAT TROPONIN I

## 2013-09-09 LAB — HEPATITIS B SURFACE ANTIGEN: Hepatitis B Surface Ag: NEGATIVE

## 2013-09-09 LAB — CK TOTAL AND CKMB (NOT AT ARMC)
CK, MB: 3.3 ng/mL (ref 0.3–4.0)
Relative Index: 2.1 (ref 0.0–2.5)
Total CK: 155 U/L (ref 7–232)

## 2013-09-09 LAB — GLUCOSE, CAPILLARY
Glucose-Capillary: 116 mg/dL — ABNORMAL HIGH (ref 70–99)
Glucose-Capillary: 199 mg/dL — ABNORMAL HIGH (ref 70–99)
Glucose-Capillary: 80 mg/dL (ref 70–99)

## 2013-09-09 LAB — INFLUENZA PANEL BY PCR (TYPE A & B)
H1N1 flu by pcr: NOT DETECTED
Influenza A By PCR: NEGATIVE
Influenza B By PCR: POSITIVE — AB

## 2013-09-09 LAB — TROPONIN I
Troponin I: 1.5 ng/mL (ref <0.30)
Troponin I: 1.87 ng/mL (ref <0.30)

## 2013-09-09 MED ORDER — SODIUM CHLORIDE 0.9 % IV SOLN: 100.0000 mL | INTRAVENOUS | Status: DC | PRN

## 2013-09-09 MED ORDER — NEPRO/CARBSTEADY PO LIQD
237.0000 mL | Freq: Two times a day (BID) | ORAL | Status: DC
  Administered 2013-09-10 – 2013-09-13 (×5): 237 mL via ORAL
  Filled 2013-09-09: qty 237

## 2013-09-09 MED ORDER — INSULIN ASPART 100 UNIT/ML ~~LOC~~ SOLN
0.0000 [IU] | Freq: Three times a day (TID) | SUBCUTANEOUS | Status: DC
  Administered 2013-09-11: 5 [IU] via SUBCUTANEOUS
  Administered 2013-09-11: 8 [IU] via SUBCUTANEOUS

## 2013-09-09 MED ORDER — GUAIFENESIN ER 600 MG PO TB12
600.0000 mg | ORAL_TABLET | Freq: Two times a day (BID) | ORAL | Status: DC
  Administered 2013-09-09 – 2013-09-13 (×9): 600 mg via ORAL
  Filled 2013-09-09 (×10): qty 1

## 2013-09-09 MED ORDER — VANCOMYCIN HCL IN DEXTROSE 750-5 MG/150ML-% IV SOLN
750.0000 mg | INTRAVENOUS | Status: DC
  Administered 2013-09-09: 750 mg via INTRAVENOUS
  Filled 2013-09-09 (×3): qty 150

## 2013-09-09 MED ORDER — PANTOPRAZOLE SODIUM 40 MG IV SOLR
40.0000 mg | INTRAVENOUS | Status: DC
  Administered 2013-09-09: 40 mg via INTRAVENOUS
  Filled 2013-09-09 (×2): qty 40

## 2013-09-09 MED ORDER — PREDNISONE 50 MG PO TABS
50.0000 mg | ORAL_TABLET | Freq: Every day | ORAL | Status: DC
  Administered 2013-09-09: 50 mg via ORAL
  Filled 2013-09-09 (×3): qty 1

## 2013-09-09 MED ORDER — DARBEPOETIN ALFA-POLYSORBATE 100 MCG/0.5ML IJ SOLN
INTRAMUSCULAR | Status: AC
  Administered 2013-09-09: 100 ug via INTRAVENOUS
  Filled 2013-09-09: qty 0.5

## 2013-09-09 MED ORDER — PENTAFLUOROPROP-TETRAFLUOROETH EX AERO: 1.0000 "application " | INHALATION_SPRAY | CUTANEOUS | Status: DC | PRN

## 2013-09-09 MED ORDER — HEPARIN SODIUM (PORCINE) 5000 UNIT/ML IJ SOLN
5000.0000 [IU] | Freq: Three times a day (TID) | INTRAMUSCULAR | Status: DC
  Administered 2013-09-09 – 2013-09-13 (×10): 5000 [IU] via SUBCUTANEOUS
  Filled 2013-09-09 (×16): qty 1

## 2013-09-09 MED ORDER — INSULIN ASPART 100 UNIT/ML ~~LOC~~ SOLN
0.0000 [IU] | Freq: Every day | SUBCUTANEOUS | Status: DC
  Administered 2013-09-10: 2 [IU] via SUBCUTANEOUS
  Administered 2013-09-12: 3 [IU] via SUBCUTANEOUS

## 2013-09-09 MED ORDER — HYDROXYZINE HCL 10 MG PO TABS
10.0000 mg | ORAL_TABLET | Freq: Three times a day (TID) | ORAL | Status: DC | PRN
  Administered 2013-09-09 – 2013-09-13 (×5): 10 mg via ORAL
  Filled 2013-09-09 (×6): qty 1

## 2013-09-09 MED ORDER — LIDOCAINE HCL (PF) 1 % IJ SOLN: 5.0000 mL | INTRAMUSCULAR | Status: DC | PRN

## 2013-09-09 MED ORDER — VANCOMYCIN HCL IN DEXTROSE 1-5 GM/200ML-% IV SOLN
1000.0000 mg | INTRAVENOUS | Status: DC
  Filled 2013-09-09: qty 200

## 2013-09-09 MED ORDER — OSELTAMIVIR PHOSPHATE 30 MG PO CAPS
30.0000 mg | ORAL_CAPSULE | Freq: Every day | ORAL | Status: DC
  Administered 2013-09-09 – 2013-09-11 (×3): 30 mg via ORAL
  Filled 2013-09-09 (×3): qty 1

## 2013-09-09 MED ORDER — VANCOMYCIN HCL 10 G IV SOLR
2000.0000 mg | Freq: Once | INTRAVENOUS | Status: AC
  Administered 2013-09-09: 2000 mg via INTRAVENOUS
  Filled 2013-09-09: qty 2000

## 2013-09-09 MED ORDER — LABETALOL HCL 100 MG PO TABS
100.0000 mg | ORAL_TABLET | Freq: Two times a day (BID) | ORAL | Status: DC
  Administered 2013-09-09 – 2013-09-13 (×7): 100 mg via ORAL
  Filled 2013-09-09 (×9): qty 1

## 2013-09-09 MED ORDER — NEPRO/CARBSTEADY PO LIQD
237.0000 mL | ORAL | Status: DC | PRN
  Filled 2013-09-09: qty 237

## 2013-09-09 MED ORDER — RENA-VITE PO TABS
1.0000 | ORAL_TABLET | Freq: Every day | ORAL | Status: DC
  Administered 2013-09-09 – 2013-09-12 (×4): 1 via ORAL
  Filled 2013-09-09 (×5): qty 1

## 2013-09-09 MED ORDER — SODIUM CHLORIDE 0.9 % IJ SOLN
3.0000 mL | Freq: Two times a day (BID) | INTRAMUSCULAR | Status: DC
  Administered 2013-09-09 – 2013-09-12 (×9): 3 mL via INTRAVENOUS

## 2013-09-09 MED ORDER — ALTEPLASE 2 MG IJ SOLR
2.0000 mg | Freq: Once | INTRAMUSCULAR | Status: DC | PRN
  Filled 2013-09-09: qty 2

## 2013-09-09 MED ORDER — DARBEPOETIN ALFA-POLYSORBATE 100 MCG/0.5ML IJ SOLN
100.0000 ug | INTRAMUSCULAR | Status: DC
  Administered 2013-09-09: 100 ug via INTRAVENOUS
  Filled 2013-09-09: qty 0.5

## 2013-09-09 MED ORDER — PANTOPRAZOLE SODIUM 40 MG PO TBEC
40.0000 mg | DELAYED_RELEASE_TABLET | Freq: Every day | ORAL | Status: DC
  Administered 2013-09-10 – 2013-09-13 (×4): 40 mg via ORAL
  Filled 2013-09-09 (×4): qty 1

## 2013-09-09 MED ORDER — LIDOCAINE-PRILOCAINE 2.5-2.5 % EX CREA: 1.0000 "application " | TOPICAL_CREAM | CUTANEOUS | Status: DC | PRN

## 2013-09-09 MED ORDER — LABETALOL HCL 300 MG PO TABS
300.0000 mg | ORAL_TABLET | Freq: Two times a day (BID) | ORAL | Status: DC
  Filled 2013-09-09 (×2): qty 1

## 2013-09-09 MED ORDER — IPRATROPIUM BROMIDE 0.02 % IN SOLN
0.5000 mg | RESPIRATORY_TRACT | Status: DC
  Administered 2013-09-09 – 2013-09-10 (×6): 0.5 mg via RESPIRATORY_TRACT
  Filled 2013-09-09 (×5): qty 2.5

## 2013-09-09 MED ORDER — HEPARIN SODIUM (PORCINE) 1000 UNIT/ML DIALYSIS: 1000.0000 [IU] | INTRAMUSCULAR | Status: DC | PRN

## 2013-09-09 MED ORDER — DEXTROSE 5 % IV SOLN
2.0000 g | Freq: Once | INTRAVENOUS | Status: AC
  Administered 2013-09-09: 2 g via INTRAVENOUS
  Filled 2013-09-09: qty 2

## 2013-09-09 MED ORDER — ACETAMINOPHEN 650 MG RE SUPP: 650.0000 mg | Freq: Four times a day (QID) | RECTAL | Status: DC | PRN

## 2013-09-09 MED ORDER — DEXTROSE 5 % IV SOLN
2.0000 g | INTRAVENOUS | Status: DC
  Filled 2013-09-09 (×2): qty 2

## 2013-09-09 MED ORDER — GENTAMICIN IN SALINE 1.6-0.9 MG/ML-% IV SOLN
80.0000 mg | Freq: Once | INTRAVENOUS | Status: DC
  Filled 2013-09-09: qty 50

## 2013-09-09 MED ORDER — CAMPHOR-MENTHOL 0.5-0.5 % EX LOTN
TOPICAL_LOTION | CUTANEOUS | Status: DC | PRN
  Administered 2013-09-12: 01:00:00 via TOPICAL
  Filled 2013-09-09 (×2): qty 222

## 2013-09-09 MED ORDER — ASPIRIN EC 325 MG PO TBEC
325.0000 mg | DELAYED_RELEASE_TABLET | Freq: Every day | ORAL | Status: DC
  Administered 2013-09-09 – 2013-09-13 (×5): 325 mg via ORAL
  Filled 2013-09-09 (×6): qty 1

## 2013-09-09 MED ORDER — ACETAMINOPHEN 325 MG PO TABS
650.0000 mg | ORAL_TABLET | Freq: Four times a day (QID) | ORAL | Status: DC | PRN
  Administered 2013-09-09 – 2013-09-12 (×3): 650 mg via ORAL
  Filled 2013-09-09 (×3): qty 2

## 2013-09-09 MED ORDER — ALBUTEROL SULFATE (5 MG/ML) 0.5% IN NEBU
2.5000 mg | INHALATION_SOLUTION | RESPIRATORY_TRACT | Status: DC
  Administered 2013-09-09 – 2013-09-10 (×7): 2.5 mg via RESPIRATORY_TRACT
  Filled 2013-09-09 (×6): qty 0.5

## 2013-09-09 MED ORDER — OSELTAMIVIR PHOSPHATE 75 MG PO CAPS: 75.0000 mg | ORAL_CAPSULE | Freq: Every day | ORAL | Status: DC

## 2013-09-09 MED ORDER — HEPARIN SODIUM (PORCINE) 1000 UNIT/ML DIALYSIS
5000.0000 [IU] | INTRAMUSCULAR | Status: DC | PRN
  Filled 2013-09-09: qty 5

## 2013-09-09 MED ORDER — VANCOMYCIN HCL IN DEXTROSE 1-5 GM/200ML-% IV SOLN
1000.0000 mg | Freq: Once | INTRAVENOUS | Status: DC
  Filled 2013-09-09: qty 200

## 2013-09-09 NOTE — Procedures (Signed)
I was present at this session.  I have reviewed the session itself and made appropriate changes.  HD via L arm avf, bp ok. Will lower dry.  Chanin Frumkin L 12/22/20141:44 PM

## 2013-09-09 NOTE — H&P (Signed)
[facility] History and Physical  Patient: Steven Forbes  UJW:[id number]  DOB: 1926-02-11  DOS: the patient was seen and examined on 09/09/2013 PCP: Gwen Pounds, MD  Chief Complaint: Shortness of breath  HPI: Steven Forbes is a 79 y.o. male with Past medical history of hypertension, diabetes mellitus, ESRD on dialysis Monday Wednesday Friday, diabetes mellitus. The patient is coming from home. The patient was brought in by his family member was noted that since last 3 days he has been having worsening cough with fever and shortness of breath. Patient was brought in on Friday after his dialysis with tachycardia. On reviewing the note he had an episode of bradycardia and then tachycardia with heart rate in 130, 3.4 L of fluid was removed. On arrival to ED his heart rate stabilized and he was in normal sinus rhythm. And he was sent back home. As per the family since Saturday there has been gradual decline in his mental status as well and he has been more tired and lethargic. Today they also hear some wheezing. The patient has a dry cough since last to 3 days. The patient denies any complaint of chest pain or nausea or vomiting or diarrhea or leg swelling. No active bleeding noted by the family.  Review of Systems: as mentioned in the history of present illness.  A Comprehensive review of the other systems is negative.  Past Medical History  Diagnosis Date  . Hyperlipidemia   . Anemia   . Diabetes mellitus   . Hypertension   . Chronic kidney disease     Dialysis on MONDAY, [facility] and [facility]   Past Surgical History  Procedure Laterality Date  . Laparoscopic gastrotomy w/ repair of ulcer    . Av fistula placement  2008    Left Upper Arm   Social History:  reports that he has never smoked. He does not have any smokeless tobacco history on file. He reports that he does not drink alcohol or use illicit drugs. Independent for most of his  ADL.  No Known Allergies  No family  history on file.  Prior to Admission medications   Medication Sig Start Date End Date Taking? Authorizing Provider  folic acid-vitamin b complex-vitamin c-selenium-zinc (DIALYVITE) 3 MG TABS Take 1 tablet by mouth daily.     Yes Historical Provider, MD  Insulin Isophane Human (HUMULIN N PEN [place]) Inject 12 Units into the skin 2 (two) times daily.     Yes Historical Provider, MD  labetalol (NORMODYNE) 300 MG tablet Take 300 mg by mouth 3 (three) times daily.    Yes Historical Provider, MD  omeprazole (PRILOSEC) 20 MG capsule Take 20 mg by mouth daily.     Yes Historical Provider, MD  simvastatin (ZOCOR) 40 MG tablet Take 40 mg by mouth at bedtime.     Yes Historical Provider, MD    Physical Exam: Filed Vitals:   09/09/13 0118 09/09/13 0130 09/09/13 0200 09/09/13 0245  BP: 128/49 110/59 128/45 121/60  Pulse: 86 87 86 86  Temp: 100 F (37.8 C)     TempSrc: Rectal     Resp: 17 19 24 24   SpO2: 98% 99% 97% 100%    General: Alert, Awake and Oriented to Time, Place and Person. Appear in mild distress Eyes: PERRL ENT: Oral Mucosa clear moist. Neck: minimal JVD Cardiovascular: S1 and S2 Present, aortic Murmur, Peripheral Pulses Present Respiratory: Bilateral Air entry equal and Decreased, bilateral basal Crackles, occasional wheezes Abdomen: Bowel Sound Present, Soft and Non  tender Skin: No Rash Extremities: Trace Pedal edema, no calf tenderness Neurologic: Grossly Unremarkable. He was able to recall his date of birth, current year month and day, and name identify his family, aware of the location,  Labs on Admission:  CBC:  Recent Labs Lab 09/09/13 0006  WBC 14.6*  NEUTROABS 13.0*  HGB 10.0*  HCT 30.0*  MCV 92.6  PLT 216    CMP     Component Value Date/Time   NA 136 09/09/2013 0006   K 3.2* 09/09/2013 0006   CL 93* 09/09/2013 0006   CO2 27 09/09/2013 0006   GLUCOSE 80 09/09/2013 0006   BUN 38* 09/09/2013 0006   CREATININE 6.52* 09/09/2013 0006   CALCIUM 8.7 09/09/2013  0006   CALCIUM 8.4 02/26/2009 1145   PROT 6.8 07/19/2011 0831   ALBUMIN 2.8* 07/20/2011 0707   AST 49* 07/19/2011 0831   ALT 82* 07/19/2011 0831   ALKPHOS 405* 07/19/2011 0831   BILITOT 0.4 07/19/2011 0831   GFRNONAA 7* 09/09/2013 0006   GFRAA 8* 09/09/2013 0006    No results found for this basename: LIPASE, AMYLASE,  in the last 168 hours No results found for this basename: AMMONIA,  in the last 168 hours   Recent Labs Lab 09/09/13 0045  CKTOTAL 155  CKMB 3.3  TROPONINI 1.98*   BNP (last 3 results)  Recent Labs  09/09/13 0006  PROBNP >70000.0*    Radiological Exams on Admission: Dg Chest Port 1 View  09/09/2013   CLINICAL DATA:  Short of breath.  EXAM: PORTABLE CHEST - 1 VIEW  COMPARISON:  Chest radiograph 05/26/2011.  FINDINGS: Cardiomegaly. Perihilar and basilar predominant airspace disease compatible with pulmonary edema and CHF. Monitoring leads project over the chest. Aortic arch atherosclerosis.  IMPRESSION: Moderate CHF.   Electronically Signed   By: Andreas Newport M.D.   On: 09/09/2013 00:39    EKG: Independently reviewed. Poor R wave progression no other signs of acute ischemia.  Assessment/Plan Principal Problem:   Acute heart failure Active Problems:   DIABETES MELLITUS   NSTEMI (non-ST elevated myocardial infarction)   ESRD on dialysis   possible Pneumonia   1. Acute heart failure Patient is presenting with she does of breath and cough with wheezing. Wheezing at present has resolved. His chest x-ray suggest volume overload and his last dialysis was on Friday. More likely his current presentation is secondary to worsening of diastolic dysfunction. But he has significantly elevated troponin. I will consult nephrology and cardiology for further workup and continuation of his hemodialysis to remove the excess volume.  2. Possible bronchitis/pneumonia Patient's family also mentions that he has history of asthma although I do not see any documented  PFT. At present his wheezes could be secondary to cardiac asthma but improvement due to nebulizations could point to bronchospasm. He also has fever with cough. Which could be secondary to viral bronchitis. At present I would empirically treat him with oral steroids, IV antibiotics, and nebulization. I will get blood culture, sputum culture, urine antigens, influenza PCR.  3. Non-STEMI Likely type II or demand ischemia. There is also a possibility that this is chronically elevated troponin due to his renal dysfunction. I have consulted cardiology on phone and at present we'll start him on oral aspirin 325 mg, obtain an echocardiogram for further workup. He has history of upper GI bleeding secondary to peptic ulcer and therefore he has been recommended not to take aspirin by his PCP since 2007. With this history in mind  heparinizing him would be more risk than benefit. I would follow serial troponins.  4. Diabetes mellitus Sliding scale for insulin  Consults: Nephrology and cardiology appreciate input  DVT Prophylaxis: subcutaneous Heparin Nutrition: Diabetic and renal diet  Code Status: Full  Family Communication: Family was present at bedside, opportunity was given to ask question and all questions were answered satisfactorily at the time of interview. Disposition: Admitted to inpatient in telemetry unit.  Author: Lynden Oxford, MD [facility] Pager: [phone] 09/09/2013, 3:44 AM    If 7PM-7AM, please contact night-coverage [email or web address] Password [email or web address]

## 2013-09-09 NOTE — ED Notes (Signed)
Admitting MD at BS.  

## 2013-09-09 NOTE — Progress Notes (Signed)
PT Cancellation Note  Patient Details Name: Steven Forbes MRN: [id number] DOB: 08/29/1926   Cancelled Treatment:     pt off floor at dialysis. Will re-attempt to evaluate pt at next available time.    Donnamarie Poag Regino Ramirez, [place] [phone] 09/09/2013, 1:22 PM

## 2013-09-09 NOTE — Consult Note (Signed)
[facility] Renal Consultation Note  Indication for Consultation:  Management of ESRD/hemodialysis; anemia, hypertension/volume and secondary hyperparathyroidism  HPI: Steven Forbes is a 79 y.o. male with a history of diabetes, hypertension, and ESRD on dialysis on MWF at the [facility] who presented to the ED last night with two days of worsening fatigue, dyspnea, nonproductive cough, and fever.  His last dialysis was on Friday 12/19, when he successfully reached his dry weight of 77 kg, but was sent to the ED after 3:45 of treatment for asymptomatic  tachycardia which spontaneously resolved.  Labs today indicate an elevated troponin, requiring Cardiology consult, but also Influenza B.  He has had a poor appetite for several months until recently and has possibly lost weight.  Although he is compliant with dialysis, his chest x-ray today suggests pulmonary edema, and he will receive dialysis today.SOB lying flat,  Dialysis Orders:  MWF @ [facility] 4 hrs    77 kg    400/A1.5    2K/2.25Ca    Profile 4     AVF @ LUA     Heparin 5000 U    Hectorol 2 mcg     Epogen 4000 U     Venofer 0  Past Medical History  Diagnosis Date  . Hyperlipidemia   . Anemia   . Diabetes mellitus   . Hypertension   . Chronic kidney disease     Dialysis on MONDAY, [facility] and [place]   Past Surgical History  Procedure Laterality Date  . Laparoscopic gastrotomy w/ repair of ulcer    . Av fistula placement  2008    Left Upper Arm   Family History  Problem Relation Age of Onset  . Cancer Mother   . Cancer Father   . Diabetes Son   . Diabetes Daughter   . Diabetes Daughter   . Diabetes Cousin    Social History  He has not smoked cigarettes or drunk alcohol since he was very young and never used illicit drugs.  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  No Known Allergies Prior to Admission medications   Medication Sig Start Date End Date  Taking? Authorizing Provider  folic acid-vitamin b complex-vitamin c-selenium-zinc (DIALYVITE) 3 MG TABS Take 1 tablet by mouth daily.     Yes Historical Provider, MD  Insulin Isophane Human (HUMULIN N PEN [place]) Inject 12 Units into the skin 2 (two) times daily.     Yes Historical Provider, MD  labetalol (NORMODYNE) 300 MG tablet Take 300 mg by mouth 3 (three) times daily.    Yes Historical Provider, MD  omeprazole (PRILOSEC) 20 MG capsule Take 20 mg by mouth daily.     Yes Historical Provider, MD  simvastatin (ZOCOR) 40 MG tablet Take 40 mg by mouth at bedtime.     Yes Historical Provider, MD   Labs:  Results for orders placed during the hospital encounter of 09/08/13 (from the past 48 hour(s))  LACTIC ACID, PLASMA     Status: None   Collection Time    09/09/13 12:06 AM      Result Value Range   Lactic Acid, Venous 1.3  0.5 - 2.2 mmol/L  BASIC METABOLIC PANEL     Status: Abnormal   Collection Time    09/09/13 12:06 AM      Result Value Range   Sodium 136  135 - 145 mEq/L   Potassium 3.2 (*) 3.5 - 5.1 mEq/L   Chloride  93 (*) 96 - 112 mEq/L   CO2 27  19 - 32 mEq/L   Glucose, Bld 80  70 - 99 mg/dL   BUN 38 (*) 6 - 23 mg/dL   Creatinine, Ser 5.18 (*) 0.50 - 1.35 mg/dL   Calcium 8.7  8.4 - 84.1 mg/dL   GFR calc non Af Amer 7 (*) >90 mL/min   GFR calc Af Amer 8 (*) >90 mL/min   Comment: (NOTE)     The eGFR has been calculated using the CKD EPI equation.     This calculation has not been validated in all clinical situations.     eGFR's persistently <90 mL/min signify possible Chronic Kidney     Disease.  CBC WITH DIFFERENTIAL     Status: Abnormal   Collection Time    09/09/13 12:06 AM      Result Value Range   WBC 14.6 (*) 4.0 - 10.5 K/uL   RBC 3.24 (*) 4.22 - 5.81 MIL/uL   Hemoglobin 10.0 (*) 13.0 - 17.0 g/dL   HCT 66.0 (*) 63.0 - 16.0 %   MCV 92.6  78.0 - 100.0 fL   MCH 30.9  26.0 - 34.0 pg   MCHC 33.3  30.0 - 36.0 g/dL   RDW 10.9 (*) 32.3 - 55.7 %   Platelets 216  150 - 400  K/uL   Neutrophils Relative % 89 (*) 43 - 77 %   Neutro Abs 13.0 (*) 1.7 - 7.7 K/uL   Lymphocytes Relative 4 (*) 12 - 46 %   Lymphs Abs 0.6 (*) 0.7 - 4.0 K/uL   Monocytes Relative 7  3 - 12 %   Monocytes Absolute 1.0  0.1 - 1.0 K/uL   Eosinophils Relative 0  0 - 5 %   Eosinophils Absolute 0.0  0.0 - 0.7 K/uL   Basophils Relative 0  0 - 1 %   Basophils Absolute 0.0  0.0 - 0.1 K/uL  PRO B NATRIURETIC PEPTIDE     Status: Abnormal   Collection Time    09/09/13 12:06 AM      Result Value Range   Pro B Natriuretic peptide (BNP) >70000.0 (*) 0 - 450 pg/mL  GLUCOSE, CAPILLARY     Status: None   Collection Time    09/09/13 12:21 AM      Result Value Range   Glucose-Capillary 80  70 - 99 mg/dL  TROPONIN I     Status: Abnormal   Collection Time    09/09/13 12:45 AM      Result Value Range   Troponin I 1.98 (*) <0.30 ng/mL   Comment:            Due to the release kinetics of cTnI,     a negative result within the first hours     of the onset of symptoms does not rule out     myocardial infarction with certainty.     If myocardial infarction is still suspected,     repeat the test at appropriate intervals.     CRITICAL RESULT CALLED TO, READ BACK BY AND VERIFIED WITH:     LAMBERT,R RN 09/09/2013 0327 JORDANS  CK TOTAL AND CKMB     Status: None   Collection Time    09/09/13 12:45 AM      Result Value Range   Total CK 155  7 - 232 U/L   CK, MB 3.3  0.3 - 4.0 ng/mL   Relative Index 2.1  0.0 -  2.5  POCT I-STAT TROPONIN I     Status: Abnormal   Collection Time    09/09/13  1:18 AM      Result Value Range   Troponin i, poc 1.40 (*) 0.00 - 0.08 ng/mL   Comment NOTIFIED PHYSICIAN     Comment 3            Comment: Due to the release kinetics of cTnI,     a negative result within the first hours     of the onset of symptoms does not rule out     myocardial infarction with certainty.     If myocardial infarction is still suspected,     repeat the test at appropriate intervals.  POCT  I-STAT 3, BLOOD GAS (G3P V)     Status: Abnormal   Collection Time    09/09/13  2:07 AM      Result Value Range   pH, Ven 7.384 (*) 7.250 - 7.300   pCO2, Ven 48.3  45.0 - 50.0 mmHg   pO2, Ven 54.0 (*) 30.0 - 45.0 mmHg   Bicarbonate 28.5 (*) 20.0 - 24.0 mEq/L   TCO2 30  0 - 100 mmol/L   O2 Saturation 84.0     Acid-Base Excess 3.0 (*) 0.0 - 2.0 mmol/L   Patient temperature 100.8 F     Sample type VENOUS    INFLUENZA PANEL BY PCR     Status: Abnormal   Collection Time    09/09/13  4:55 AM      Result Value Range   Influenza A By PCR NEGATIVE  NEGATIVE   Influenza B By PCR POSITIVE (*) NEGATIVE   H1N1 flu by pcr NOT DETECTED  NOT DETECTED   Comment:            The Xpert Flu assay (FDA approved for     nasal aspirates or washes and     nasopharyngeal swab specimens), is     intended as an aid in the diagnosis of     influenza and should not be used as     a sole basis for treatment.  COMPREHENSIVE METABOLIC PANEL     Status: Abnormal   Collection Time    09/09/13  5:57 AM      Result Value Range   Sodium 135  135 - 145 mEq/L   Potassium 3.7  3.5 - 5.1 mEq/L   Chloride 95 (*) 96 - 112 mEq/L   CO2 25  19 - 32 mEq/L   Glucose, Bld 125 (*) 70 - 99 mg/dL   BUN 41 (*) 6 - 23 mg/dL   Creatinine, Ser 2.84 (*) 0.50 - 1.35 mg/dL   Calcium 8.7  8.4 - 13.2 mg/dL   Total Protein 7.0  6.0 - 8.3 g/dL   Albumin 2.4 (*) 3.5 - 5.2 g/dL   AST 440 (*) 0 - 37 U/L   ALT 109 (*) 0 - 53 U/L   Alkaline Phosphatase 414 (*) 39 - 117 U/L   Total Bilirubin 0.7  0.3 - 1.2 mg/dL   GFR calc non Af Amer 7 (*) >90 mL/min   GFR calc Af Amer 8 (*) >90 mL/min   Comment: (NOTE)     The eGFR has been calculated using the CKD EPI equation.     This calculation has not been validated in all clinical situations.     eGFR's persistently <90 mL/min signify possible Chronic Kidney     Disease.  CBC  Status: Abnormal   Collection Time    09/09/13  5:57 AM      Result Value Range   WBC 16.7 (*) 4.0 - 10.5  K/uL   RBC 3.28 (*) 4.22 - 5.81 MIL/uL   Hemoglobin 10.0 (*) 13.0 - 17.0 g/dL   HCT 09.8 (*) 11.9 - 14.7 %   MCV 92.7  78.0 - 100.0 fL   MCH 30.5  26.0 - 34.0 pg   MCHC 32.9  30.0 - 36.0 g/dL   RDW 82.9 (*) 56.2 - 13.0 %   Platelets 190  150 - 400 K/uL  PROTIME-INR     Status: Abnormal   Collection Time    09/09/13  5:57 AM      Result Value Range   Prothrombin Time 15.3 (*) 11.6 - 15.2 seconds   INR 1.24  0.00 - 1.49  TROPONIN I     Status: Abnormal   Collection Time    09/09/13  5:57 AM      Result Value Range   Troponin I 1.34 (*) <0.30 ng/mL   Comment:            Due to the release kinetics of cTnI,     a negative result within the first hours     of the onset of symptoms does not rule out     myocardial infarction with certainty.     If myocardial infarction is still suspected,     repeat the test at appropriate intervals.     CRITICAL VALUE NOTED.  VALUE IS CONSISTENT WITH PREVIOUSLY REPORTED AND CALLED VALUE.  GLUCOSE, CAPILLARY     Status: Abnormal   Collection Time    09/09/13  8:05 AM      Result Value Range   Glucose-Capillary 106 (*) 70 - 99 mg/dL   Constitutional: positive for fatigue and fevers, negative for chills and sweats Ears, nose, mouth, throat, and face: negative for earaches, hoarseness, nasal congestion and sore throat Respiratory: positive for nonproductive cough and dyspnea on exertion, negative for hemoptysis and sputum Cardiovascular: positive for dyspnea, negative for chest pain, chest pressure/discomfort, orthopnea and palpitations Gastrointestinal: positive for poor appetite, negative for abdominal pain, change in bowel habits, nausea and vomiting Genitourinary:negative, anuric Musculoskeletal:negative for back pain, bone pain, myalgias and neck pain Neurological: negative for dizziness, gait problems, headaches and paresthesia  Physical Exam: Filed Vitals:   09/09/13 0958  BP: 127/61  Pulse: 83  Temp: 98 F (36.7 C)  Resp: 20      General appearance: alert, cooperative and no distressmild dyspnea Head: Normocephalic, without obvious abnormality, atraumatic Fundi DM retinopathy with scarring Neck: A&PCL, no carotid bruit,  JVD to > of jaw and supple, symmetrical, trachea midline Resp: Diminished breath sounds with mild bilateral rhonchi and expiratory wheezes primarily in bases Cardio: RRR with Gr II/VI holo systolic murmur, no rub 2+ edema GI: soft, non-tender; bowel sounds normal; no masses,  no organomegaly Liver firm and down 5 cm Extremities: extremities normal, atraumatic, no cyanosis  R leg swollen mid calf and constricted distally, has TMA on that side and old bypass  Neurologic: Grossly normal Dialysis Access: AVF @ LUA with + bruit   Assessment/Plan: 1. Cough, dyspnea, fever - + for Influenza B, also pulmonary edema per chest x-ray, WBCs 16.7; started Tamiflu, also Vancomycin & Cefepime, HD today & lower EDW. 2. Elevated troponins - no chest pain, EKG with no acute ischemic changes, seen by Cardiology. 3. ESRD - HD on MWF @ [facility];  K 3.7; on holiday schedule (STF), but allowed to run today at center to attend church yesterday.  HD pending today.Vol xs 4. Hypertension/volume - BP 127/61 on Labetalol 300 mg bid; wt 75 kg, below EDW.  UF goal 4 L today. Lowe bp meds 5. Anemia - Hgb 10 on outpatient Epogen, last T-sat 28% with ferritin 929.  Aranesp 100 mcg today. 6. Metabolic bone disease - Ca 8.7 (10 corrected), last P 1.3 (12/5), last iPTH 312; Hectorol 2 mcg, no binders.  Recheck renal panel today. 7. Nutrition - Alb 2.4, recent poor appetite, vitamin. 8. DM Type 2 - on insulin. 9. Hx PVD - s/p R transmet amputation 07/19/11.  LYLES,CHARLES 09/09/2013, 10:22 AM I have seen and examined this patient and agree with the plan of care  Seen , examined, questions addressed. .  Jailyne Chieffo L 09/09/2013, 11:52 AM   Attending Nephrologist: Beryle Lathe, MD

## 2013-09-09 NOTE — Progress Notes (Signed)
[facility] PROGRESS NOTE  Steven Forbes AVW:[id number] DOB: 1926-02-09 DOA: 09/08/2013  PCP: Gwen Pounds, MD  Brief HPI: Steven Forbes is a 79 y.o. male with Past medical history of hypertension, diabetes mellitus, ESRD on dialysis Monday Wednesday Friday, diabetes mellitus. The patient was brought in by his family member was noted that since last 3 days he has been having worsening cough with fever and shortness of breath. Patient was brought in on Friday after his dialysis with tachycardia. On reviewing the note he had an episode of bradycardia and then tachycardia with heart rate in 130, 3.4 L of fluid was removed. On arrival to ED his heart rate stabilized and he was in normal sinus rhythm. And he was sent back home.   Past medical history:  Past Medical History  Diagnosis Date  . Hyperlipidemia   . Anemia   . Diabetes mellitus   . Hypertension   . Chronic kidney disease     Dialysis on MONDAY, [facility] and [facility]    Consultants: Nephrology and Cardiology  Procedures: 2D ECHO is pending.  Antibiotics: Vanc 12/22--> Cefepime 12/22--> Tamiflu 12/22-->  Subjective: Patient somnolent. Per family he didn't sleep well overnight. He was easily arousable. Denies any chest pain. Has been coughing.  Objective: Vital Signs  Filed Vitals:   09/09/13 0245 09/09/13 0354 09/09/13 0500 09/09/13 0936  BP: 121/60 137/68 120/54   Pulse: 86 81 77 80  Temp:  99.5 F (37.5 C) 98.2 F (36.8 C)   TempSrc:  Oral Oral   Resp: 24 22 16 20   Height:  5\' 3"  (1.6 m)    Weight:  74.98 kg (165 lb 4.8 oz)    SpO2: 100% 95% 96% 98%    Intake/Output Summary (Last 24 hours) at 09/09/13 0946 Last data filed at 09/09/13 0509  Gross per 24 hour  Intake    550 ml  Output      0 ml  Net    550 ml   Filed Weights   09/09/13 0354  Weight: 74.98 kg (165 lb 4.8 oz)   General appearance: Somnolent but arousable. No distress. Resp: Decreased air entry bilaterally. Few wheezes. few  crackles. Cardio: regular rate and rhythm, S1, S2 normal, no murmur, click, rub or gallop GI: soft, non-tender; bowel sounds normal; no masses,  no organomegaly Extremities: extremities normal, atraumatic, no cyanosis or edema Skin: Skin color, texture, turgor normal. No rashes or lesions Neurologic: Arousable. No focal deficits.  Lab Results:  Basic Metabolic Panel:  Recent Labs Lab 09/09/13 0006 09/09/13 0557  NA 136 135  K 3.2* 3.7  CL 93* 95*  CO2 27 25  GLUCOSE 80 125*  BUN 38* 41*  CREATININE 6.52* 6.68*  CALCIUM 8.7 8.7   Liver Function Tests:  Recent Labs Lab 09/09/13 0557  AST 139*  ALT 109*  ALKPHOS 414*  BILITOT 0.7  PROT 7.0  ALBUMIN 2.4*   No results found for this basename: LIPASE, AMYLASE,  in the last 168 hours No results found for this basename: AMMONIA,  in the last 168 hours CBC:  Recent Labs Lab 09/09/13 0006 09/09/13 0557  WBC 14.6* 16.7*  NEUTROABS 13.0*  --   HGB 10.0* 10.0*  HCT 30.0* 30.4*  MCV 92.6 92.7  PLT 216 190   Cardiac Enzymes:  Recent Labs Lab 09/09/13 0045 09/09/13 0557  CKTOTAL 155  --   CKMB 3.3  --   TROPONINI 1.98* 1.34*   BNP (last 3 results)  Recent Labs  09/09/13  0006  PROBNP >70000.0*   CBG:  Recent Labs Lab 09/09/13 0021 09/09/13 0805  GLUCAP 80 106*    No results found for this or any previous visit (from the past 240 hour(s)).    Studies/Results: Dg Chest Port 1 View  09/09/2013   CLINICAL DATA:  Short of breath.  EXAM: PORTABLE CHEST - 1 VIEW  COMPARISON:  Chest radiograph 05/26/2011.  FINDINGS: Cardiomegaly. Perihilar and basilar predominant airspace disease compatible with pulmonary edema and CHF. Monitoring leads project over the chest. Aortic arch atherosclerosis.  IMPRESSION: Moderate CHF.   Electronically Signed   By: Andreas Newport M.D.   On: 09/09/2013 00:39    Medications:  Scheduled: . albuterol  2.5 mg Nebulization Q4H  . aspirin EC  325 mg Oral Daily  . ceFEPime  (MAXIPIME) IV  2 g Intravenous Q M,W,F-2000  . guaiFENesin  600 mg Oral BID  . heparin  5,000 Units Subcutaneous Q8H  . insulin aspart  0-15 Units Subcutaneous TID WC  . insulin aspart  0-5 Units Subcutaneous QHS  . ipratropium  0.5 mg Nebulization Q4H  . labetalol  300 mg Oral BID  . oseltamivir  30 mg Oral Daily  . pantoprazole (PROTONIX) IV  40 mg Intravenous Q24H  . predniSONE  50 mg Oral Q breakfast  . sodium chloride  3 mL Intravenous Q12H  . vancomycin  1,000 mg Intravenous Q M,W,F-HD   Continuous:  BMW:[id number], acetaminophen  Assessment/Plan:  Principal Problem:   Acute CHF Active Problems:   DIABETES MELLITUS   NSTEMI (non-ST elevated myocardial infarction)   ESRD on dialysis   possible Pneumonia   Influenza B    Acute Congestive Heart Failure with Elevated Troponin Most likely due to acute illness and ESRD. ECHO has been ordered. Discussed with Dr. Darrick Penna. He will need dialysis today. Unclear if elevated troponin is due to demand ischemia or renal failure. EKG did not show acute ischemic changes. Cardiology consulted. Continue Aspirin. No clear indication for anticoagulation at this time.  Possible HCAP/Wheezing/Influenza B His presentation is most likely due to fluid overload and Influenza. Continue prednisone for now. Continue antibiotics for now but it likely can be discontinued if cultures are negative. Initiate Tamiflu. Discussed with pharmacy and dose was adjusted.  Transaminitis Possibly from acute infection. Trend numbers. Check hepatitis panel and [place]. Check Ammonia.  Diabetes mellitus Type 2 Continue Sliding scale.  ESRD on HD MWF Nephrology consulted.  Code Status: Full Code DVT Prophylaxis: Heparin SQ    Family Communication: Discussed with wife and daughter at bedside. Asked them to discuss flu exposure with their respective physicians.  Disposition Plan: Not ready for discharge. Will return home when ready. PT/OT.    LOS: 1 day    [facility]  [facility] Pager [phone] 09/09/2013, 9:46 AM  If 8PM-8AM, please contact night-coverage at [email or web address], password [facility]

## 2013-09-09 NOTE — ED Notes (Signed)
CBG: 80 

## 2013-09-09 NOTE — Progress Notes (Signed)
ANTIBIOTIC CONSULT NOTE - INITIAL  Pharmacy Consult for vancomycin and cefepime Indication: rule out pneumonia  No Known Allergies  Patient Measurements: Weight: ~80kg  Vital Signs: Temp: 100 F (37.8 C) (12/22 0118) Temp src: Rectal (12/22 0118) BP: 128/45 mmHg (12/22 0200) Pulse Rate: 86 (12/22 0200)  Labs:  Recent Labs  09/09/13 0006  WBC 14.6*  HGB 10.0*  PLT 216  CREATININE 6.52*    Microbiology: No results found for this or any previous visit (from the past 720 hour(s)).  Medical History: Past Medical History  Diagnosis Date  . Hyperlipidemia   . Anemia   . Diabetes mellitus   . Hypertension   . Chronic kidney disease     Dialysis on MONDAY, [facility] and [place]     Assessment: 79yo male c/o fever and SOB, O2 sat 94% and temp 100.2 in ED, CXR c/w pulmonary edema and CHF, to begin IV ABX for possible PNA.  Goal of Therapy:  Pre-HD vanc level 15-25  Plan:  Will give vancomycin 2000mg  IV x1 now followed by 1g after each HD as well as cefepime 2g now and MWF pm, monitor CBC, Cx, levels prn.  Vernard Gambles, PharmD, BCPS  09/09/2013,2:16 AM

## 2013-09-09 NOTE — Progress Notes (Signed)
INITIAL NUTRITION ASSESSMENT  DOCUMENTATION CODES Per approved criteria  -Severe malnutrition in the context of acute illness or injury   INTERVENTION: Recommend Nepro Shake po BID, each supplement provides 425 kcal and 19 grams protein. Agree with diet liberalization. RD to continue to follow nutrition care plan.  NUTRITION DIAGNOSIS: Inadequate oral intake related to poor appetite as evidenced by pt report and ongoing weight loss.   Goal: Intake to meet >90% of estimated nutrition needs.  Monitor:  weight trends, lab trends, I/O's, PO intake, supplement tolerance  Reason for Assessment: Malnutrition Screening Tool  79 y.o. male  Admitting Dx: Acute CHF  ASSESSMENT: PMHx significant for HTN, DM, ESRD (MWF). Admitted with cough, fever and SOB x 3 days. Work-up reveals volume overload and Influenza B positive.  Currently NPO, was previously on a Carbohydrate Modified Medium diet with 1200 ml fluid restriction. Pt in HD at this time. Per renal, pt has had a poor appetite for several months until recently and has possibly lost weight. Current weight is 75 kg and pt reports his usual weight is 81.5 kg. This is a weight change of about 8% x 1-2 months and is significant. Pt states that "they've been getting on to me about losing weight."  Pt meets criteria for severe MALNUTRITION in the context of acute illness as evidenced by 8% wt loss x 2 months and intake of <75% x at least 1 month.  Currently afebrile.   Sodium and potassium WNL.  Height: Ht Readings from Last 1 Encounters:  09/09/13 5\' 3"  (1.6 m)    Weight: Wt Readings from Last 1 Encounters:  09/09/13 165 lb 4.8 oz (74.98 kg)    Ideal Body Weight: 124 lb  % Ideal Body Weight: 133%  Wt Readings from Last 10 Encounters:  09/09/13 165 lb 4.8 oz (74.98 kg)  05/24/11 180 lb (81.647 kg)  05/10/11 180 lb (81.647 kg)  12/08/08 184 lb (83.462 kg)  11/25/08 182 lb (82.555 kg)    Usual Body Weight: 180 lb (per pt,  last weighed this 1-2 months ago)  % Usual Body Weight: 92%  BMI:  Body mass index is 29.29 kg/(m^2). Overweight  Estimated Nutritional Needs: Kcal: 1600 - 1750 Protein: at least 90 g Fluid: 1.2 liters daily  Skin: intact  Diet Order: NPO  EDUCATION NEEDS: -No education needs identified at this time   Intake/Output Summary (Last 24 hours) at 09/09/13 1027 Last data filed at 09/09/13 0900  Gross per 24 hour  Intake    610 ml  Output      0 ml  Net    610 ml    Last BM: PTA  Labs:   Recent Labs Lab 09/09/13 0006 09/09/13 0557  NA 136 135  K 3.2* 3.7  CL 93* 95*  CO2 27 25  BUN 38* 41*  CREATININE 6.52* 6.68*  CALCIUM 8.7 8.7  GLUCOSE 80 125*    CBG (last 3)   Recent Labs  09/09/13 0021 09/09/13 0805  GLUCAP 80 106*    Scheduled Meds: . albuterol  2.5 mg Nebulization Q4H  . aspirin EC  325 mg Oral Daily  . ceFEPime (MAXIPIME) IV  2 g Intravenous Q M,W,F-2000  . guaiFENesin  600 mg Oral BID  . heparin  5,000 Units Subcutaneous Q8H  . insulin aspart  0-15 Units Subcutaneous TID WC  . insulin aspart  0-5 Units Subcutaneous QHS  . ipratropium  0.5 mg Nebulization Q4H  . labetalol  300 mg Oral BID  .  oseltamivir  30 mg Oral Daily  . pantoprazole (PROTONIX) IV  40 mg Intravenous Q24H  . predniSONE  50 mg Oral Q breakfast  . sodium chloride  3 mL Intravenous Q12H  . vancomycin  1,000 mg Intravenous Q M,W,F-HD    Continuous Infusions:   Past Medical History  Diagnosis Date  . Hyperlipidemia   . Anemia   . Diabetes mellitus   . Hypertension   . Chronic kidney disease     Dialysis on MONDAY, [facility] and [place]    Past Surgical History  Procedure Laterality Date  . Laparoscopic gastrotomy w/ repair of ulcer    . Av fistula placement  2008    Left Upper Arm    Jarold Motto MS, RD, LDN Pager: [phone] After-hours pager: [phone]

## 2013-09-09 NOTE — Evaluation (Signed)
Occupational Therapy Evaluation and Discharge Patient Details Name: Steven Forbes MRN: [id number] DOB: [birth year] Today's Date: 09/09/2013 Time: 4098-1191 OT Time Calculation (min): 25 min  OT Assessment / Plan / Recommendation History of present illness The patient was brought in by his family member was noted that since last 3 days he has been having worsening cough with fever and shortness of breath. Found to have the flu.   Clinical Impression   This 79 yo male admitted with above presents to acute OT at a S level and will have this at home, will D/C from acute OT.    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 Restrictions Weight Bearing Restrictions: No   Pertinent Vitals/Pain 97% on 2liters O2; 96% on 1 liter; 91% on RA---left pt on 1 liter when I left room and RN aware    ADL  Eating/Feeding: Independent Where Assessed - Eating/Feeding: Edge of bed Grooming: Supervision/safety Where Assessed - Grooming: Unsupported standing Upper Body Bathing: Supervision/safety Where Assessed - Upper Body Bathing: Unsupported sit to stand Lower Body Bathing: Supervision/safety Where Assessed - Lower Body Bathing: Unsupported sit to stand Upper Body Dressing: Supervision/safety Where Assessed - Upper Body Dressing: Unsupported sit to stand Lower Body Dressing: Supervision/safety Where Assessed - Lower Body Dressing: Unsupported sit to stand Toilet Transfer: Supervision/safety Toilet Transfer Method: Sit to Barista: Regular height toilet;Grab bars Toileting - Clothing Manipulation and Hygiene: Supervision/safety Where Assessed - Engineer, mining and Hygiene: Sit to stand from 3-in-1  or toilet Equipment Used:  (None; however pt did "furniture walk") Transfers/Ambulation Related to ADLs: S for all without AD and "furniture walking" ADL Comments: educated pt and family on purse lipped breathing when pt feels SOB      OT Goals(Current goals can be found in the care plan section)    Visit Information  Last OT Received On: 09/09/13 Assistance Needed: +1 History of Present Illness: The patient was brought in by his family member was noted that since last 3 days he has been having worsening cough with fever and shortness of breath       Prior Functioning     Home Living Family/patient expects to be discharged to:: Private residence Living Arrangements: Spouse/significant other Available Help at Discharge: Family;Available 24 hours/day Type of Home: House Home Access: Level entry Home Layout: One level Home Equipment: Cane - single point (did not use) Prior Function Level of Independence: Independent Communication Communication: HOH Dominant Hand: Right         Vision/Perception Vision - History Patient Visual Report: No change from baseline   Cognition  Cognition Arousal/Alertness: Awake/alert Behavior During Therapy: WFL for tasks assessed/performed Overall Cognitive Status: Within Functional Limits for tasks assessed    Extremity/Trunk Assessment Upper Extremity Assessment Upper Extremity Assessment: Overall WFL for tasks assessed Lower Extremity Assessment Lower Extremity Assessment: Defer to PT evaluation     Mobility Bed Mobility Bed Mobility: Supine to Sit;Sitting - Scoot to [place] of Bed;Sit to Supine;Scooting to [facility] Supine to Sit: 6: Modified independent (Device/Increase time);HOB elevated Sitting - Scoot to Edge of Bed: 7: Independent Sit to Supine: 6: Modified independent (Device/Increase time);HOB elevated Scooting to [facility]: 5: Supervision;With rail Transfers Transfers: Sit  to Stand;Stand to Sit Sit to Stand: 5: Supervision;With upper  extremity assist;From bed Stand to Sit: 5: Supervision;With upper extremity assist;To bed           End of Session OT - End of Session Activity Tolerance:  (O2 dropped on RA to 91% from 96% on 1 liter) Patient left: in bed;with call bell/phone within reach;with family/visitor present Nurse Communication: Mobility status (Pt left on 1 liter of O2 with sats 96%)    Evette Georges [phone] 09/09/2013, 12:30 PM

## 2013-09-09 NOTE — Consult Note (Signed)
CARDIOLOGY CONSULT NOTE   Patient ID: Natasha Paulson MRN: [id number] DOB/AGE: 77-Dec-1927 29 y.o.   Admit date: 09/08/2013  Primary Physician   Gwen Pounds, MD Primary Cardiologist   none Reason for Consultation   Elevated troponin  HPI: Abdulkareem Badolato is a 79 y.o. male with a history of HTN, HLD, DM, ESRD on dialysis M, W, F who was admitted for the Influenza B (+ by PCR) and is being evaluated by cardiology for elevated troponin and s/s of CHF.   On Friday he had an episode of tachycardia while having dialysis and was sent to the ED. He was sent home and then he returned again last night with cough and fevers. He was found to have elevated POC troponin (1.4), and mildly elevated troponin x2 (1.98 and 1.43). He denies chest pain or abdominal pain. The history was difficult to obtain as he is hard of hearing and very agitated secondary to intense pruritis. The renal PA in the room commented that his demeanor is usually very pleasant and calm.   Past Medical History  Diagnosis Date  . Hyperlipidemia   . Anemia   . Diabetes mellitus   . Hypertension   . Chronic kidney disease     Dialysis on MONDAY, [facility] and [facility]     Past Surgical History  Procedure Laterality Date  . Laparoscopic gastrotomy w/ repair of ulcer    . Av fistula placement  2008    Left Upper Arm    No Known Allergies  I have reviewed the patient's current medications . albuterol  2.5 mg Nebulization Q4H  . aspirin EC  325 mg Oral Daily  . ceFEPime (MAXIPIME) IV  2 g Intravenous Q M,W,F-2000  . guaiFENesin  600 mg Oral BID  . heparin  5,000 Units Subcutaneous Q8H  . insulin aspart  0-15 Units Subcutaneous TID WC  . insulin aspart  0-5 Units Subcutaneous QHS  . ipratropium  0.5 mg Nebulization Q4H  . labetalol  300 mg Oral BID  . oseltamivir  30 mg Oral Daily  . pantoprazole (PROTONIX) IV  40 mg Intravenous Q24H  . predniSONE  50 mg Oral Q breakfast  . sodium chloride  3 mL Intravenous Q12H  .  vancomycin  1,000 mg Intravenous Q M,W,F-HD     acetaminophen, acetaminophen, hydrOXYzine  Prior to Admission medications   Medication Sig Start Date End Date Taking? Authorizing Provider  folic acid-vitamin b complex-vitamin c-selenium-zinc (DIALYVITE) 3 MG TABS Take 1 tablet by mouth daily.     Yes Historical Provider, MD  Insulin Isophane Human (HUMULIN N PEN [place]) Inject 12 Units into the skin 2 (two) times daily.     Yes Historical Provider, MD  labetalol (NORMODYNE) 300 MG tablet Take 300 mg by mouth 3 (three) times daily.    Yes Historical Provider, MD  omeprazole (PRILOSEC) 20 MG capsule Take 20 mg by mouth daily.     Yes Historical Provider, MD  simvastatin (ZOCOR) 40 MG tablet Take 40 mg by mouth at bedtime.     Yes Historical Provider, MD     History   Social History  . Marital Status: Married    Spouse Name: N/A    Number of Children: N/A  . Years of Education: N/A   Occupational History  . Not on file.   Social History Main Topics  . Smoking status: Never Smoker   . Smokeless tobacco: Not on file  . Alcohol Use: No  . Drug Use:  No  . Sexual Activity:    Other Topics Concern  . Not on file   Social History Narrative  . No narrative on file    Family Status  Relation Status Death Age  . Mother Deceased     cancer  . Father Deceased     cancer   Family History  Problem Relation Age of Onset  . Cancer Mother   . Cancer Father   . Diabetes Son   . Diabetes Daughter   . Diabetes Daughter   . Diabetes Cousin      ROS:  Full 14 point review of systems complete and found to be negative unless listed above.  Physical Exam: Blood pressure 127/61, pulse 83, temperature 99.2 F (37.3 C), temperature source Oral, resp. rate 20, height 5\' 3"  (1.6 m), weight 165 lb 4.8 oz (74.98 kg), SpO2 96.00%.  General: Well developed, well nourished, male seen writhing in bed with itching + hard of hearing Head: Eyes PERRLA, No xanthomas.   +scleral icterus Lungs: Course  Rhonchi and wheezing  Heart: HRRR S1 S2, no rub/gallop, Heart irregular rate and rhythm with S1, S2  murmur. pulses diminished in all extremities  Neck: No carotid bruits. No lymphadenopathy.  Abdomen: Bowel sounds present, abdomen soft and non-tender without masses or hernias noted. Msk:  No spine or cva tenderness. No weakness, no joint deformities or effusions. Extremities: No clubbing or cyanosis. Right Transmetatarsal amputation  Neuro: Alert and oriented X 3. No focal deficits noted. Psych:  Seems agitated but pleasant Skin: No rashes or lesions noted.  Labs:   Lab Results  Component Value Date   WBC 16.7* 09/09/2013   HGB 10.0* 09/09/2013   HCT 30.4* 09/09/2013   MCV 92.7 09/09/2013   PLT 190 09/09/2013    Recent Labs  09/09/13 0557  INR 1.24     Recent Labs Lab 09/09/13 0557  NA 135  K 3.7  CL 95*  CO2 25  BUN 41*  CREATININE 6.68*  CALCIUM 8.7  PROT 7.0  BILITOT 0.7  ALKPHOS 414*  ALT 109*  AST 139*  GLUCOSE 125*  ALBUMIN 2.4*     Recent Labs  09/09/13 0045 09/09/13 0557  CKTOTAL 155  --   CKMB 3.3  --   TROPONINI 1.98* 1.34*    Recent Labs  09/09/13 0118  TROPIPOC 1.40*   Pro B Natriuretic peptide (BNP)  Date/Time Value Range Status  09/09/2013 12:06 AM >70000.0* 0 - 450 pg/mL Final    Echo: pending  Study Date: 08/18/2009 -------------------------------------------------------------------- Indications: Hypertension - benign 401.1. -------------------------------------------------------------------- History: PMH: Renal failure. Risk factors: Diabetes mellitus. Dyslipidemia. -------------------------------------------------------------------- Study Conclusions 1. Left ventricle: The cavity size was mildly reduced. Wall thickness was increased in a pattern of moderate LVH. There was moderate concentric hypertrophy. Systolic function was normal. The estimated ejection fraction was in the range of 55% to 65%. Wall motion was  normal; there were no regional wall motion abnormalities. Doppler parameters are consistent with abnormal left ventricular relaxation (grade 1 diastolic dysfunction). 2. Aortic valve: Cusp separation was mildly reduced. There was mild stenosis. Valve area: 1.56cm^2(VTI). Valve area: 1.46cm^2 (Vmax). 3. Mitral valve: Moderately to severely calcified annulus. Severely calcified leaflets posterior. Mild regurgitation. -------------------------------------------------------------- Left ventricle: The cavity size was mildly reduced. Wall thickness was increased in a pattern of moderate LVH. There was moderate concentric hypertrophy. Systolic function was normal. The estimated ejection fraction was in the range of 55% to 65%. Wall motion was normal; there were no regional  wall motion abnormalities. Doppler parameters are consistent with abnormal left ventricular relaxation (grade 1 diastolic dysfunction). Aortic valve: Trileaflet; moderately calcified leaflets. Cusp separation was mildly reduced. Doppler: There was mild stenosis. No regurgitation. VTI ratio of LVOT to aortic valve: 0.61. Valve area: 1.56cm^2(VTI). Indexed valve area: 0.83cm^2/m^2 (VTI). Peak velocity ratio of LVOT to aortic valve: 0.57. Valve area: 1.46cm^2 (Vmax). Indexed valve area: 0.77cm^2/m^2 (Vmax). Mean gradient: 10mm Hg (S). Peak gradient: 21mm Hg (S). Aorta: The aorta was normal, not dilated, and non-diseased. Mitral valve: Moderately to severely calcified annulus. Severely calcified leaflets posterior. No echocardiographic evidence for prolapse. Doppler: There was no evidence for stenosis. Mild regurgitation. Valve area by pressure half-time: 2.73cm^2. Indexed valve area by pressure half-time: 1.45cm^2/m^2. Mean gradient: 5mm Hg (D). Peak gradient: 3mm Hg (D). Left atrium: The atrium was normal in size. Right ventricle: The cavity size was normal. Wall thickness was normal. Systolic function was normal. Pulmonic  valve: Doppler: Trivial regurgitation. Tricuspid valve: Structurally normal valve. Leaflet separation was normal. Doppler: Transvalvular velocity was within the normal range. Mild regurgitation. Right atrium: The atrium was normal in size. Pericardium: There was no pericardial effusion. Post procedure conclusions Ascending Aorta: - The aorta was normal, not dilated, and non-diseased.     ECG:  09/09/13 HR 84, SR Left axis deviation Abnormal R-wave progression, late transition Borderline T wave abnormalities Borderline prolonged QT interval  Radiology:  Dg Chest Port 1 View  09/09/2013   CLINICAL DATA:  Short of breath.  EXAM: PORTABLE CHEST - 1 VIEW  COMPARISON:  Chest radiograph 05/26/2011.  FINDINGS: Cardiomegaly. Perihilar and basilar predominant airspace disease compatible with pulmonary edema and CHF. Monitoring leads project over the chest. Aortic arch atherosclerosis.  IMPRESSION: Moderate CHF.   ASSESSMENT AND PLAN:    Principal Problem:   Acute CHF Active Problems:   DIABETES MELLITUS   NSTEMI (non-ST elevated myocardial infarction)   ESRD on dialysis   possible Pneumonia   Influenza B  Elevated Troponin -- no CP, EKG with no acute changes -- Unclear if due to demand ischemia or renal failure. EKG did not show acute ischemic changes and no chest pain, could be falsely elevated in the setting of ESRD -- Continue aspirin and BB, no statin in the setting of transamintitis  -- Continue to monitor troponin and serial ECGs  Acute Congestive Heart Failure BNP >70K although not as helpful in the setting of ESRD, CXR with moderate signs of CHF, patient with dyspnea -- Pending ECHO, ECHO in 2010 showed EF-55-65% with grade 1 diastolic dysfunction -- Will go to dialysis today, this should help alleviate s/s of volume overload  HTN -- Continue BB  Influenza B  -- Continue Tamiflu per IM   Diabetes mellitus Type 2  Continue SSI -- per IM  ESRD on HD MWF  -- With  intense pruritis-- ordered Atarax --Nephrology following  Transaminitis  --Possibly from acute infection. Trend numbers. Check hepatitis panel and [place]. Check Ammonia.  -- No statin  Signed: Janeece Agee 09/09/2013 10:33 AM  Pager [phone]  Co-Sign MD As above, patient seen and examined. Briefly he is an 79 year old male with end-stage renal disease/dialysis dependent, hypertension, hyperlipidemia, diabetes mellitus, peripheral vascular disease who I am asked to evaluate for congestive heart failure and elevated troponin. Apparently seen in the emergency room on December 19 with tachycardia following dialysis. I have no strips available. Readmitted early today with complaints of dyspnea, cough and fever. He denies chest pain. He has had decreased by mouth  intake. Chest x-ray shows congestive heart failure. BUN and creatinine 41 and 6.68. Troponin is 1.98, 1.40, and 1.34. Influenza B screen positive. Electrocardiogram shows sinus rhythm with nonspecific ST changes. Patient's presentation is consistent with influenza. Agree with continued therapy. His x-ray does show congestive heart failure and nephrology is evaluating as he needs to be dialyzed today. He makes very little urine. We will plan an echocardiogram to assess LV function. Continue to cycle enzymes. If LV function normal and enzymes showed no clear trend up we will plan a nuclear study for risk stratification. If his LV function is abnormal he will most likely require cardiac catheterization. I would like for him to improve from his influenza prior to proceeding. Would continue treatment with aspirin and continue beta blocker.  Olga Millers

## 2013-09-09 NOTE — ED Notes (Signed)
Dr Lavella Lemons given a copy of the results of troponin 1.40

## 2013-09-10 ENCOUNTER — Inpatient Hospital Stay (HOSPITAL_COMMUNITY): Payer: Medicare Other

## 2013-09-10 DIAGNOSIS — I369 Nonrheumatic tricuspid valve disorder, unspecified: Secondary | ICD-10-CM

## 2013-09-10 LAB — COMPREHENSIVE METABOLIC PANEL
ALT: 121 U/L — ABNORMAL HIGH (ref 0–53)
AST: 172 U/L — ABNORMAL HIGH (ref 0–37)
Albumin: 2.5 g/dL — ABNORMAL LOW (ref 3.5–5.2)
Alkaline Phosphatase: 444 U/L — ABNORMAL HIGH (ref 39–117)
Chloride: 95 mEq/L — ABNORMAL LOW (ref 96–112)
Potassium: 4.1 mEq/L (ref 3.5–5.1)
Sodium: 139 mEq/L (ref 135–145)
Total Bilirubin: 1.2 mg/dL (ref 0.3–1.2)
Total Protein: 7.3 g/dL (ref 6.0–8.3)

## 2013-09-10 LAB — GLUCOSE, CAPILLARY
Glucose-Capillary: 150 mg/dL — ABNORMAL HIGH (ref 70–99)
Glucose-Capillary: 213 mg/dL — ABNORMAL HIGH (ref 70–99)

## 2013-09-10 LAB — RENAL FUNCTION PANEL
Albumin: 2.5 g/dL — ABNORMAL LOW (ref 3.5–5.2)
BUN: 26 mg/dL — ABNORMAL HIGH (ref 6–23)
Chloride: 94 mEq/L — ABNORMAL LOW (ref 96–112)
GFR calc Af Amer: 16 mL/min — ABNORMAL LOW (ref >90)
Phosphorus: 3.1 mg/dL (ref 2.3–4.6)
Potassium: 4.2 mEq/L (ref 3.5–5.1)
Sodium: 138 mEq/L (ref 135–145)

## 2013-09-10 LAB — CBC
HCT: 31.1 % — ABNORMAL LOW (ref 39.0–52.0)
MCHC: 33.1 g/dL (ref 30.0–36.0)
Platelets: 176 10*3/uL (ref 150–400)
RBC: 3.35 MIL/uL — ABNORMAL LOW (ref 4.22–5.81)
RDW: 18.3 % — ABNORMAL HIGH (ref 11.5–15.5)
WBC: 21.3 10*3/uL — ABNORMAL HIGH (ref 4.0–10.5)

## 2013-09-10 LAB — HEPATITIS PANEL, ACUTE
Hep A IgM: NONREACTIVE
Hep B C IgM: NONREACTIVE
Hepatitis B Surface Ag: NEGATIVE

## 2013-09-10 MED ORDER — SODIUM CHLORIDE 0.9 % IV SOLN: 100.0000 mL | INTRAVENOUS | Status: DC | PRN

## 2013-09-10 MED ORDER — PREDNISONE 20 MG PO TABS
20.0000 mg | ORAL_TABLET | Freq: Every day | ORAL | Status: DC
  Administered 2013-09-10: 20 mg via ORAL
  Filled 2013-09-10 (×4): qty 1

## 2013-09-10 MED ORDER — NEPRO/CARBSTEADY PO LIQD
237.0000 mL | ORAL | Status: DC | PRN
  Filled 2013-09-10: qty 237

## 2013-09-10 MED ORDER — LIDOCAINE HCL (PF) 1 % IJ SOLN: 5.0000 mL | INTRAMUSCULAR | Status: DC | PRN

## 2013-09-10 MED ORDER — LIDOCAINE-PRILOCAINE 2.5-2.5 % EX CREA
1.0000 "application " | TOPICAL_CREAM | CUTANEOUS | Status: DC | PRN
  Filled 2013-09-10: qty 5

## 2013-09-10 MED ORDER — ALTEPLASE 2 MG IJ SOLR
2.0000 mg | Freq: Once | INTRAMUSCULAR | Status: DC | PRN
  Filled 2013-09-10: qty 2

## 2013-09-10 MED ORDER — HEPARIN SODIUM (PORCINE) 1000 UNIT/ML DIALYSIS: 1000.0000 [IU] | INTRAMUSCULAR | Status: DC | PRN

## 2013-09-10 MED ORDER — ALBUTEROL SULFATE (5 MG/ML) 0.5% IN NEBU
2.5000 mg | INHALATION_SOLUTION | RESPIRATORY_TRACT | Status: DC | PRN
  Administered 2013-09-11 – 2013-09-12 (×2): 2.5 mg via RESPIRATORY_TRACT
  Filled 2013-09-10 (×2): qty 0.5

## 2013-09-10 MED ORDER — PENTAFLUOROPROP-TETRAFLUOROETH EX AERO: 1.0000 "application " | INHALATION_SPRAY | CUTANEOUS | Status: DC | PRN

## 2013-09-10 MED ORDER — HEPARIN SODIUM (PORCINE) 1000 UNIT/ML DIALYSIS
100.0000 [IU]/kg | INTRAMUSCULAR | Status: DC | PRN
  Filled 2013-09-10: qty 8

## 2013-09-10 NOTE — Progress Notes (Signed)
[facility] PROGRESS NOTE  [name] ZHY:[id number] DOB: June 02, 1926 DOA: 09/08/2013  PCP: Gwen Pounds, MD  Brief HPI: Steven Forbes is a 79 y.o. male with Past medical history of hypertension, diabetes mellitus, ESRD on dialysis Monday Wednesday Friday, diabetes mellitus. The patient was brought in by his family member was noted that since last 3 days he has been having worsening cough with fever and shortness of breath. Patient was brought in on Friday after his dialysis with tachycardia. On reviewing the note he had an episode of bradycardia and then tachycardia with heart rate in 130, 3.4 L of fluid was removed. On arrival to ED his heart rate stabilized and he was in normal sinus rhythm. And he was sent back home.   Past medical history:  Past Medical History  Diagnosis Date  . Hyperlipidemia   . Anemia   . Diabetes mellitus   . Hypertension   . Chronic kidney disease     Dialysis on MONDAY, [facility] and [facility]    Consultants: Nephrology and Cardiology  Procedures: 2D ECHO is pending.  Antibiotics: Vanc 12/22--> Cefepime 12/22--> Tamiflu 12/22-->  Subjective: Patient awake and alert. Denies any chest pain. Has a dry cough.   Objective: Vital Signs  Filed Vitals:   09/09/13 1840 09/09/13 2219 09/09/13 2230 09/10/13 0545  BP: 125/71 100/46  123/58  Pulse: 71 73  75  Temp: 98.8 F (37.1 C) 97.6 F (36.4 C)  97.5 F (36.4 C)  TempSrc: Oral Oral  Oral  Resp: 22 21  20   Height:      Weight:  74.208 kg (163 lb 9.6 oz)    SpO2: 98% 96% 97% 94%    Intake/Output Summary (Last 24 hours) at 09/10/13 0808 Last data filed at 09/09/13 1745  Gross per 24 hour  Intake     60 ml  Output   3964 ml  Net  -3904 ml   Filed Weights   09/09/13 1341 09/09/13 1745 09/09/13 2219  Weight: 80.1 kg (176 lb 9.4 oz) 76.1 kg (167 lb 12.3 oz) 74.208 kg (163 lb 9.6 oz)   General appearance: No distress. Resp: Improved air entry bilaterally. No wheezing or crackles heard  today.  Cardio: regular rate and rhythm, S1, S2 normal, no murmur, click, rub or gallop GI: soft, non-tender; bowel sounds normal; no masses,  no organomegaly Extremities: extremities normal, atraumatic, no cyanosis or edema Skin: Skin color, texture, turgor normal. No rashes or lesions Neurologic: Arousable. No focal deficits.  Lab Results:  Basic Metabolic Panel:  Recent Labs Lab 09/09/13 0006 09/09/13 0557 09/10/13 0420  NA 136 135 138  139  K 3.2* 3.7 4.2  4.1  CL 93* 95* 94*  95*  CO2 27 25 23  23   GLUCOSE 80 125* 74  73  BUN 38* 41* 26*  26*  CREATININE 6.52* 6.68* 3.76*  3.76*  CALCIUM 8.7 8.7 8.5  8.5  PHOS  --   --  3.1   Liver Function Tests:  Recent Labs Lab 09/09/13 0557 09/10/13 0420  AST 139* 172*  ALT 109* 121*  ALKPHOS 414* 444*  BILITOT 0.7 1.2  PROT 7.0 7.3  ALBUMIN 2.4* 2.5*  2.5*   No results found for this basename: LIPASE, AMYLASE,  in the last 168 hours  Recent Labs Lab 09/09/13 1030  AMMONIA 32   CBC:  Recent Labs Lab 09/09/13 0006 09/09/13 0557 09/10/13 0420  WBC 14.6* 16.7* 21.3*  NEUTROABS 13.0*  --   --  HGB 10.0* 10.0* 10.3*  HCT 30.0* 30.4* 31.1*  MCV 92.6 92.7 92.8  PLT 216 190 176   Cardiac Enzymes:  Recent Labs Lab 09/09/13 0045 09/09/13 0557 09/09/13 1005 09/09/13 1951  CKTOTAL 155  --   --   --   CKMB 3.3  --   --   --   TROPONINI 1.98* 1.34* 1.50* 1.87*   BNP (last 3 results)  Recent Labs  09/09/13 0006  PROBNP >70000.0*   CBG:  Recent Labs Lab 09/09/13 0805 09/09/13 1143 09/09/13 1835 09/09/13 2215 09/10/13 0743  GLUCAP 106* 154* 116* 199* 73    Recent Results (from the past 240 hour(s))  CULTURE, BLOOD (ROUTINE X 2)     Status: None   Collection Time    09/09/13 12:06 AM      Result Value Range Status   Specimen Description BLOOD RIGHT UPPER ARM   Final   Special Requests BOTTLES DRAWN AEROBIC AND ANAEROBIC 5CC EACH   Final   Culture  Setup Time     Final   Value:  09/09/2013 09:21     Performed at [facility]   Culture     Final   Value:        BLOOD CULTURE RECEIVED NO GROWTH TO DATE CULTURE WILL BE HELD FOR 5 DAYS BEFORE ISSUING A FINAL NEGATIVE REPORT     Performed at [facility]   Report Status PENDING   Incomplete  CULTURE, BLOOD (ROUTINE X 2)     Status: None   Collection Time    09/09/13 12:45 AM      Result Value Range Status   Specimen Description BLOOD RIGHT ARM   Final   Special Requests BOTTLES DRAWN AEROBIC AND ANAEROBIC 5CC EACH   Final   Culture  Setup Time     Final   Value: 09/09/2013 09:22     Performed at [facility]   Culture     Final   Value:        BLOOD CULTURE RECEIVED NO GROWTH TO DATE CULTURE WILL BE HELD FOR 5 DAYS BEFORE ISSUING A FINAL NEGATIVE REPORT     Performed at [facility]   Report Status PENDING   Incomplete      Studies/Results: US Abdomen Complete  09/09/2013   CLINICAL DATA:  Abnormal liver function tests.  EXAM: ULTRASOUND ABDOMEN COMPLETE  COMPARISON:  04/08/2007.  FINDINGS: Gallbladder:  No stones. Small gallbladder polyp measuring 4 mm. No sonographic Murphy's sign. Gallbladder wall measures 3 mm, normal.  Common bile duct:  Diameter: 2 mm, normal.  Liver:  No focal lesion identified. Within normal limits in parenchymal echogenicity.  IVC:  No abnormality visualized.  Pancreas:  Mildly enlarged pancreatic duct measuring 3 mm. No mass lesion is identified.  Spleen:  8.8 cm.  Normal echotexture.  Right Kidney:  Length: 7.4 cm. Atrophic with small simple cysts measuring 1 cm or less.  Left Kidney:  Length: 7.4 cm.  Atrophic appearance.  Abdominal aorta:  No aneurysm visualized.  Other findings:  Tiny amount of ascites adjacent to liver margin.  IMPRESSION: 1. Bilateral renal atrophy.  Small right renal cysts. 2. Negative for cholelithiasis. No intrahepatic biliary ductal dilation or hepatic mass lesion. 3. Mild enlargement of pancreatic duct, measuring 3 mm. This is only  partially visualized. This could be further evaluated with followup nonemergent MRCP. Non-emergent MRI should be deferred until patient has been discharged for the acute illness, and can optimally cooperate  with positioning and breath-holding instructions. 4. Small amount of ascites. 5. Tiny gallbladder polyp.   Electronically Signed   By: Andreas Newport M.D.   On: 09/09/2013 19:27   Dg Chest Port 1 View  09/09/2013   CLINICAL DATA:  Short of breath.  EXAM: PORTABLE CHEST - 1 VIEW  COMPARISON:  Chest radiograph 05/26/2011.  FINDINGS: Cardiomegaly. Perihilar and basilar predominant airspace disease compatible with pulmonary edema and CHF. Monitoring leads project over the chest. Aortic arch atherosclerosis.  IMPRESSION: Moderate CHF.   Electronically Signed   By: Andreas Newport M.D.   On: 09/09/2013 00:39    Medications:  Scheduled: . albuterol  2.5 mg Nebulization Q4H  . aspirin EC  325 mg Oral Daily  . ceFEPime (MAXIPIME) IV  2 g Intravenous Q M,W,F-2000  . darbepoetin (ARANESP) injection - DIALYSIS  100 mcg Intravenous Q Mon-HD  . feeding supplement (NEPRO CARB STEADY)  237 mL Oral BID BM  . guaiFENesin  600 mg Oral BID  . heparin  5,000 Units Subcutaneous Q8H  . insulin aspart  0-15 Units Subcutaneous TID WC  . insulin aspart  0-5 Units Subcutaneous QHS  . ipratropium  0.5 mg Nebulization Q4H  . labetalol  100 mg Oral BID  . multivitamin  1 tablet Oral QHS  . oseltamivir  30 mg Oral Daily  . pantoprazole  40 mg Oral Daily  . predniSONE  50 mg Oral Q breakfast  . sodium chloride  3 mL Intravenous Q12H  . vancomycin  750 mg Intravenous Q M,W,F-HD   Continuous:  OZH:[id number], acetaminophen, camphor-menthol, hydrOXYzine  Assessment/Plan:  Principal Problem:   Acute CHF Active Problems:   DIABETES MELLITUS   NSTEMI (non-ST elevated myocardial infarction)   ESRD on dialysis   possible Pneumonia   Influenza B   Acute myocardial infarction, subendocardial infarction, initial  episode of care   Secondary hyperparathyroidism   Atherosclerotic peripheral vascular disease    Acute Congestive Heart Failure with Elevated Troponin Most likely due to acute illness and ESRD. ECHO is pending. Cardiology and Nephrology is following. Was dialysed yesterday. Unclear if elevated troponin is due to demand ischemia, de novo cardiac event or renal failure. EKG did not show acute ischemic changes. Continue Aspirin. No clear indication for anticoagulation at this time. No statin due to transaminitis.  NSTEMI See above. ECHO is pending. Management per cardiology. Plan is for stress test if normal EF, Cath if low EF.  Influenza B/Possible HCAP/Wheezing His presentation is most likely due to fluid overload and Influenza. No wheezing today. Will stop prednisone after 3 more doses. Continue antibiotics for now but it likely can be discontinued if cultures are negative. Continue Tamiflu. Repeat CXR. Increase in WBC likely from steroids. He remains afebrile.  Transaminitis Possibly from acute infection or passive congestion. Slight increase noted today. [place] report reviewed. Slightly enlarged pancreatic duct noted. He is non tender. This can be further pursued as OP unless LFT do not improve. Hepatitis panel pending. Ammonia was normal.  Diabetes mellitus Type 2 Continue Sliding scale.  ESRD on HD MWF Nephrology following.  Code Status: Full Code DVT Prophylaxis: Heparin SQ    Family Communication: Discussed with wife and daughter at bedside yesterday.  Disposition Plan: Not ready for discharge. Will return home when ready. PT/OT.    LOS: 2 days   [facility]  [facility] Pager [phone] 09/10/2013, 8:08 AM  If 8PM-8AM, please contact night-coverage at [email or web address], password [facility]

## 2013-09-10 NOTE — Evaluation (Signed)
Physical Therapy Evaluation Patient Details Name: Steven Forbes MRN: [id number] DOB: 11/11/1925 Today's Date: 09/10/2013 Time: 4696-2952 PT Time Calculation (min): 16 min  PT Assessment / Plan / Recommendation History of Present Illness  The patient was brought in by his family member was noted that since last 3 days he has been having worsening cough with fever and shortness of breath  Clinical Impression  Pt adm due to the above. Presents with slight instability with gt and balance deficits. Would benefit from skilled PT to address deficits indicated below (see PT problem list). Pt reports he has good family support and may have RW. Will benefit from ambulating with RW to reduce risk of falls upon D/C home, pt agreeable at this time.     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      Mobility  Bed Mobility Bed Mobility: Not assessed Details for Bed Mobility Assistance: pt sitting EOB and returned to EOB Transfers Transfers: Sit to Stand;Stand to Sit Sit to Stand: 5: Supervision;From bed Stand to Sit: 5: Supervision;To bed Details for Transfer Assistance: cues for safety and technique  Ambulation/Gait Ambulation/Gait Assistance: 5: Supervision;4: Min guard Ambulation Distance (Feet): 100 Feet Assistive device: Rolling walker;None Ambulation/Gait Assistance Details: amb with and without RW; pt  reaching for UE support when ambulating without RW ; pt more unsteady without RW: requires cues for safety and min guard when amb without RW Gait Pattern: Step-through pattern;Decreased stance time - right;Decreased step length - left;Antalgic;Wide base of support Gait velocity: decreased Stairs: No Wheelchair Mobility Wheelchair Mobility: No         PT Diagnosis: Abnormality of gait  PT Problem List: Decreased activity tolerance;Decreased mobility;Decreased balance;Decreased knowledge of use of DME PT Treatment Interventions: DME instruction;Gait training;Functional mobility training;Therapeutic activities;Therapeutic exercise;Balance training;Neuromuscular re-education     PT Goals(Current goals can be found in the care plan section) Acute Rehab PT Goals Patient Stated Goal: to go home PT Goal Formulation: With patient Time For Goal Achievement: 09/17/13 Potential to Achieve Goals: Good  Visit Information  Last PT Received On: 09/10/13 Assistance Needed: +1 History of Present Illness: The patient was brought in by his family member was noted that since last 3 days he has been having worsening cough with fever and shortness of breath       Prior Functioning  Home Living Family/patient expects to be discharged to:: Private residence Living Arrangements: Spouse/significant other Available Help at Discharge: Family;Available 24 hours/day Type of Home: House Home Access: Level entry Home Layout: One level Home Equipment: Cane - single point Prior Function Level of Independence: Independent Communication Communication: HOH Dominant Hand: Right    Cognition  Cognition Arousal/Alertness: Awake/alert Behavior During Therapy: WFL for tasks assessed/performed Overall Cognitive Status: Within Functional Limits for tasks assessed    Extremity/Trunk Assessment Upper Extremity Assessment Upper Extremity Assessment: Defer to OT evaluation Lower Extremity Assessment Lower  Extremity Assessment: Overall [facility]  for tasks assessed Cervical / Trunk Assessment Cervical / Trunk Assessment: Normal   Balance Balance Balance Assessed: Yes Static Sitting Balance Static Sitting - Balance Support: No upper extremity supported;Feet unsupported Static Sitting - Level of Assistance: 7: Independent Static Standing Balance Static Standing - Balance Support: Bilateral upper extremity supported;No upper extremity supported;During functional activity Static Standing - Level of Assistance: 5: Stand by assistance High Level Balance High Level Balance Activites: Direction changes;Head turns High Level Balance Comments: pt unsteady with high level balance activities; demo increased stability with RW   End of Session PT - End of Session Equipment Utilized During Treatment: Gait belt Activity Tolerance: Patient tolerated treatment well Patient left: in bed;with call bell/phone within reach;with bed alarm set Nurse Communication: Mobility status  GP     Donell Sievert,  [phone] 09/10/2013, 9:06 AM

## 2013-09-10 NOTE — Progress Notes (Signed)
Subjective: Interval History: has no complaint , breathing better.  Objective: Vital signs in last 24 hours: Temp:  [97.5 F (36.4 C)-98.8 F (37.1 C)] 97.9 F (36.6 C) (12/23 1018) Pulse Rate:  [71-97] 72 (12/23 1018) Resp:  [18-23] 18 (12/23 1018) BP: (100-145)/(46-74) 118/59 mmHg (12/23 1018) SpO2:  [94 %-98 %] 98 % (12/23 1018) Weight:  [74.208 kg (163 lb 9.6 oz)-80.1 kg (176 lb 9.4 oz)] 74.208 kg (163 lb 9.6 oz) (12/22 2219) Weight change: 5.121 kg (11 lb 4.6 oz)  Intake/Output from previous day: 12/22 0701 - 12/23 0700 In: 60 [P.O.:60] Out: 3964  Intake/Output this shift:    General appearance: alert, cooperative and mildly obese Resp: diminished breath sounds bilaterally, rhonchi bilaterally and wheezes bibasilar Cardio: S1, S2 normal and systolic murmur: holosystolic 3/6, blowing at apex GI: liver down 5 cm and firm Extremities: avf LUA aneurysms Skin: dry , scaling  Lab Results:  Recent Labs  09/09/13 0557 09/10/13 0420  WBC 16.7* 21.3*  HGB 10.0* 10.3*  HCT 30.4* 31.1*  PLT 190 176   BMET:  Recent Labs  09/09/13 0557 09/10/13 0420  NA 135 138  139  K 3.7 4.2  4.1  CL 95* 94*  95*  CO2 25 23  23   GLUCOSE 125* 74  73  BUN 41* 26*  26*  CREATININE 6.68* 3.76*  3.76*  CALCIUM 8.7 8.5  8.5   No results found for this basename: PTH,  in the last 72 hours Iron Studies: No results found for this basename: IRON, TIBC, TRANSFERRIN, FERRITIN,  in the last 72 hours  Studies/Results: US Abdomen Complete  09/09/2013   CLINICAL DATA:  Abnormal liver function tests.  EXAM: ULTRASOUND ABDOMEN COMPLETE  COMPARISON:  04/08/2007.  FINDINGS: Gallbladder:  No stones. Small gallbladder polyp measuring 4 mm. No sonographic Murphy's sign. Gallbladder wall measures 3 mm, normal.  Common bile duct:  Diameter: 2 mm, normal.  Liver:  No focal lesion identified. Within normal limits in parenchymal echogenicity.  IVC:  No abnormality visualized.  Pancreas:  Mildly  enlarged pancreatic duct measuring 3 mm. No mass lesion is identified.  Spleen:  8.8 cm.  Normal echotexture.  Right Kidney:  Length: 7.4 cm. Atrophic with small simple cysts measuring 1 cm or less.  Left Kidney:  Length: 7.4 cm.  Atrophic appearance.  Abdominal aorta:  No aneurysm visualized.  Other findings:  Tiny amount of ascites adjacent to liver margin.  IMPRESSION: 1. Bilateral renal atrophy.  Small right renal cysts. 2. Negative for cholelithiasis. No intrahepatic biliary ductal dilation or hepatic mass lesion. 3. Mild enlargement of pancreatic duct, measuring 3 mm. This is only partially visualized. This could be further evaluated with followup nonemergent MRCP. Non-emergent MRI should be deferred until patient has been discharged for the acute illness, and can optimally cooperate with positioning and breath-holding instructions. 4. Small amount of ascites. 5. Tiny gallbladder polyp.   Electronically Signed   By: Andreas Newport M.D.   On: 09/09/2013 19:27   Dg Chest Port 1 View  09/09/2013   CLINICAL DATA:  Short of breath.  EXAM: PORTABLE CHEST - 1 VIEW  COMPARISON:  Chest radiograph 05/26/2011.  FINDINGS: Cardiomegaly. Perihilar and basilar predominant airspace disease compatible with pulmonary edema and CHF. Monitoring leads project over the chest. Aortic arch atherosclerosis.  IMPRESSION: Moderate CHF.   Electronically Signed   By: Andreas Newport M.D.   On: 09/09/2013 00:39    I have reviewed the patient's current medications.  Assessment/Plan:  1 esrd do HD via holiday sched 2 CAD per cards, pos enz 3 Anemia stable 4 DM controlled 5 HPTH meds 6 PVD P HD, epo, flu meds, c&S check  LFTS    LOS: 2 days   Kieon Lawhorn L 09/10/2013,10:21 AM

## 2013-09-10 NOTE — Progress Notes (Signed)
  Echocardiogram 2D Echocardiogram has been performed.  Steven Forbes 09/10/2013, 10:12 AM

## 2013-09-10 NOTE — Progress Notes (Signed)
    Subjective:  Denies CP or dyspnea   Objective:  Filed Vitals:   09/09/13 1840 09/09/13 2219 09/09/13 2230 09/10/13 0545  BP: 125/71 100/46  123/58  Pulse: 71 73  75  Temp: 98.8 F (37.1 C) 97.6 F (36.4 C)  97.5 F (36.4 C)  TempSrc: Oral Oral  Oral  Resp: 22 21  20   Height:      Weight:  163 lb 9.6 oz (74.208 kg)    SpO2: 98% 96% 97% 94%    Intake/Output from previous day:  Intake/Output Summary (Last 24 hours) at 09/10/13 0743 Last data filed at 09/09/13 1745  Gross per 24 hour  Intake     60 ml  Output   3964 ml  Net  -3904 ml    Physical Exam: Physical exam: Well-developed well-nourished in no acute distress.  Skin is warm and dry.  HEENT is normal.  Neck is supple. No thyromegaly.  Chest is clear to auscultation with normal expansion.  Cardiovascular exam is regular rate and rhythm.  Abdominal exam nontender or distended. No masses palpated. Extremities show no edema. neuro grossly intact    Lab Results: Basic Metabolic Panel:  Recent Labs  16/10/96 0557 09/10/13 0420  NA 135 138  139  K 3.7 4.2  4.1  CL 95* 94*  95*  CO2 25 23  23   GLUCOSE 125* 74  73  BUN 41* 26*  26*  CREATININE 6.68* 3.76*  3.76*  CALCIUM 8.7 8.5  8.5  PHOS  --  3.1   CBC:  Recent Labs  09/09/13 0006 09/09/13 0557 09/10/13 0420  WBC 14.6* 16.7* 21.3*  NEUTROABS 13.0*  --   --   HGB 10.0* 10.0* 10.3*  HCT 30.0* 30.4* 31.1*  MCV 92.6 92.7 92.8  PLT 216 190 176   Cardiac Enzymes:  Recent Labs  09/09/13 0045 09/09/13 0557 09/09/13 1005 09/09/13 1951  CKTOTAL 155  --   --   --   CKMB 3.3  --   --   --   TROPONINI 1.98* 1.34* 1.50* 1.87*     Assessment/Plan:  Elevated Troponin -- no CP, EKG with no acute changes  -- Possibly related to demand ischemia or renal failure. -- Continue aspirin and BB, no statin in the setting of transamintitis  -- If LV function normal on echo, plan nuclear study when he improves from flu. If LV function  abnormal, will need to consider cath. Acute Congestive Heart Failure  -- Improved with dialysis; await repeat echo. HTN  -- Continue BB - dose reduced by nephrology. Influenza B  -- Continue Tamiflu per IM  Diabetes mellitus Type 2  Continue SSI -- per IM  ESRD on HD MWF  --Nephrology following  Transaminitis  --Eval per primary service. -- No statin   Olga Millers 09/10/2013, 7:43 AM

## 2013-09-10 NOTE — Procedures (Signed)
I was present at this session.  I have reviewed the session itself and made appropriate changes.  Hd via LUA AVF. bp 110-140.  Access press ok.   Giuliana Handyside L 12/23/20142:38 PM

## 2013-09-10 NOTE — Clinical Documentation Improvement (Signed)
2 QUERIES-PLEASE NOTICE  QUERY #1 Possible Clinical Conditions?  Severe Malnutrition   Protein Calorie Malnutrition Severe Protein Calorie Malnutrition Other Condition Cannot clinically determine  Supporting Information: AS PER NUTRITION ASSESSMENT Haynes Bast, RD at 09/09/2013 10:27 AM  Risk Factors:Admitting Dx:(As per notes)" Acute CHF", "DM, HTN, ESRD,"  Diagnostics::"Pt meets criteria for severe MALNUTRITION in the context of acute illness as evidenced by 8% wt loss x 2 months and intake of <75% x at least 1 month."  Treatment: INTERVENTION: Recommend Nepro Shake po BID, each supplement provides 425 kcal and 19 grams protein. Agree with diet liberalization. RD to continue to follow nutrition care plan.  QUERY #2 Please Clarify TYPE of CHF as note in the NOTES: "Acute CHF"  Thank You, Dollene Primrose, BSN, CCDS Clinical Documentation Specialist:  [phone]   [phone]=cell [place]- Health Information Management

## 2013-09-11 ENCOUNTER — Inpatient Hospital Stay (HOSPITAL_COMMUNITY): Payer: Medicare Other

## 2013-09-11 DIAGNOSIS — E43 Unspecified severe protein-calorie malnutrition: Secondary | ICD-10-CM | POA: Diagnosis present

## 2013-09-11 DIAGNOSIS — R079 Chest pain, unspecified: Secondary | ICD-10-CM

## 2013-09-11 LAB — CBC
MCH: 31.1 pg (ref 26.0–34.0)
MCHC: 33.7 g/dL (ref 30.0–36.0)
MCV: 92.3 fL (ref 78.0–100.0)
Platelets: 182 10*3/uL (ref 150–400)
RBC: 3.38 MIL/uL — ABNORMAL LOW (ref 4.22–5.81)
RDW: 18.1 % — ABNORMAL HIGH (ref 11.5–15.5)

## 2013-09-11 LAB — COMPREHENSIVE METABOLIC PANEL
ALT: 159 U/L — ABNORMAL HIGH (ref 0–53)
AST: 227 U/L — ABNORMAL HIGH (ref 0–37)
Albumin: 2.6 g/dL — ABNORMAL LOW (ref 3.5–5.2)
Alkaline Phosphatase: 461 U/L — ABNORMAL HIGH (ref 39–117)
CO2: 21 mEq/L (ref 19–32)
Calcium: 8.5 mg/dL (ref 8.4–10.5)
Creatinine, Ser: 2.53 mg/dL — ABNORMAL HIGH (ref 0.50–1.35)
GFR calc Af Amer: 25 mL/min — ABNORMAL LOW (ref >90)
GFR calc non Af Amer: 22 mL/min — ABNORMAL LOW (ref >90)
Sodium: 134 mEq/L — ABNORMAL LOW (ref 135–145)
Total Protein: 7.5 g/dL (ref 6.0–8.3)

## 2013-09-11 LAB — GLUCOSE, CAPILLARY
Glucose-Capillary: 248 mg/dL — ABNORMAL HIGH (ref 70–99)
Glucose-Capillary: 56 mg/dL — ABNORMAL LOW (ref 70–99)
Glucose-Capillary: 95 mg/dL (ref 70–99)
Glucose-Capillary: 203 mg/dL — ABNORMAL HIGH (ref 70–99)

## 2013-09-11 MED ORDER — ATORVASTATIN CALCIUM 20 MG PO TABS
20.0000 mg | ORAL_TABLET | Freq: Every day | ORAL | Status: DC
  Administered 2013-09-11: 20 mg via ORAL
  Filled 2013-09-11 (×2): qty 1

## 2013-09-11 MED ORDER — TECHNETIUM TC 99M SESTAMIBI GENERIC - CARDIOLITE
10.0000 | Freq: Once | INTRAVENOUS | Status: AC | PRN
  Administered 2013-09-11: 10 via INTRAVENOUS

## 2013-09-11 MED ORDER — OSELTAMIVIR PHOSPHATE 30 MG PO CAPS
30.0000 mg | ORAL_CAPSULE | Freq: Every day | ORAL | Status: AC
  Administered 2013-09-13: 30 mg via ORAL
  Filled 2013-09-11: qty 1

## 2013-09-11 MED ORDER — VANCOMYCIN HCL IN DEXTROSE 750-5 MG/150ML-% IV SOLN
750.0000 mg | Freq: Once | INTRAVENOUS | Status: AC
  Administered 2013-09-11: 750 mg via INTRAVENOUS
  Filled 2013-09-11: qty 150

## 2013-09-11 MED ORDER — REGADENOSON 0.4 MG/5ML IV SOLN
INTRAVENOUS | Status: AC
  Administered 2013-09-11: 0.4 mg via INTRAVENOUS
  Filled 2013-09-11: qty 5

## 2013-09-11 MED ORDER — REGADENOSON 0.4 MG/5ML IV SOLN
0.4000 mg | Freq: Once | INTRAVENOUS | Status: AC
  Administered 2013-09-11: 0.4 mg via INTRAVENOUS

## 2013-09-11 MED ORDER — TECHNETIUM TC 99M SESTAMIBI GENERIC - CARDIOLITE
30.0000 | Freq: Once | INTRAVENOUS | Status: AC | PRN
  Administered 2013-09-11: 30 via INTRAVENOUS

## 2013-09-11 MED ORDER — CEFEPIME HCL 2 G IJ SOLR
2.0000 g | Freq: Once | INTRAMUSCULAR | Status: AC
  Administered 2013-09-11: 2 g via INTRAVENOUS
  Filled 2013-09-11: qty 2

## 2013-09-11 MED ORDER — ZOLPIDEM TARTRATE 5 MG PO TABS
5.0000 mg | ORAL_TABLET | Freq: Every evening | ORAL | Status: DC | PRN
  Administered 2013-09-11: 5 mg via ORAL
  Filled 2013-09-11: qty 1

## 2013-09-11 MED ORDER — INSULIN DETEMIR 100 UNIT/ML ~~LOC~~ SOLN
5.0000 [IU] | Freq: Two times a day (BID) | SUBCUTANEOUS | Status: DC
  Administered 2013-09-11: 5 [IU] via SUBCUTANEOUS
  Filled 2013-09-11 (×4): qty 0.05

## 2013-09-11 NOTE — Progress Notes (Signed)
Subjective: Interval History: has no complaint, cough with no phlegm.  Objective: Vital signs in last 24 hours: Temp:  [97.5 F (36.4 C)-98.7 F (37.1 C)] 97.6 F (36.4 C) (12/24 0820) Pulse Rate:  [66-90] 86 (12/24 0820) Resp:  [17-19] 19 (12/24 0820) BP: (114-148)/(51-91) 139/70 mmHg (12/24 0820) SpO2:  [96 %-98 %] 96 % (12/24 0820) Weight:  [71 kg (156 lb 8.4 oz)-73 kg (160 lb 15 oz)] 71 kg (156 lb 8.4 oz) (12/23 1805) Weight change: -7.1 kg (-15 lb 10.5 oz)  Intake/Output from previous day: 12/23 0701 - 12/24 0700 In: 243 [P.O.:240; I.V.:3] Out: 2006  Intake/Output this shift:    General appearance: alert, cooperative and mildly obese Resp: rales bibasilar and rhonchi bibasilar Cardio: S1, S2 normal and systolic murmur: holosystolic 3/6, blowing at apex GI: obese,pos bs , liver down 5 cm Extremities: AVF LUA, B&T Skin dry  Lab Results:  Recent Labs  09/10/13 0420 09/11/13 0420  WBC 21.3* 15.6*  HGB 10.3* 10.5*  HCT 31.1* 31.2*  PLT 176 182   BMET:  Recent Labs  09/10/13 0420 09/11/13 0420  NA 138  139 134*  K 4.2  4.1 4.6  CL 94*  95* 92*  CO2 23  23 21   GLUCOSE 74  73 285*  BUN 26*  26* 20  CREATININE 3.76*  3.76* 2.53*  CALCIUM 8.5  8.5 8.5   No results found for this basename: PTH,  in the last 72 hours Iron Studies: No results found for this basename: IRON, TIBC, TRANSFERRIN, FERRITIN,  in the last 72 hours  Studies/Results: Dg Chest 2 View  09/10/2013   CLINICAL DATA:  Evaluate CHF  EXAM: CHEST  2 VIEW  COMPARISON:  09/09/2013  FINDINGS: Low lung volumes. The cardiac silhouette is enlarged. Aorta is tortuous. There is decreased conspicuity of the interstitial prominence and perihilar opacities. Decreased conspicuity of the bibasilar densities. No new focal regions of consolidation. The osseous structures are grossly unremarkable.  IMPRESSION: Improved interstitial infiltrate consistent with improved pulmonary edema.   Electronically Signed    By: Salome Holmes M.D.   On: 09/10/2013 12:19   US Abdomen Complete  09/09/2013   CLINICAL DATA:  Abnormal liver function tests.  EXAM: ULTRASOUND ABDOMEN COMPLETE  COMPARISON:  04/08/2007.  FINDINGS: Gallbladder:  No stones. Small gallbladder polyp measuring 4 mm. No sonographic Murphy's sign. Gallbladder wall measures 3 mm, normal.  Common bile duct:  Diameter: 2 mm, normal.  Liver:  No focal lesion identified. Within normal limits in parenchymal echogenicity.  IVC:  No abnormality visualized.  Pancreas:  Mildly enlarged pancreatic duct measuring 3 mm. No mass lesion is identified.  Spleen:  8.8 cm.  Normal echotexture.  Right Kidney:  Length: 7.4 cm. Atrophic with small simple cysts measuring 1 cm or less.  Left Kidney:  Length: 7.4 cm.  Atrophic appearance.  Abdominal aorta:  No aneurysm visualized.  Other findings:  Tiny amount of ascites adjacent to liver margin.  IMPRESSION: 1. Bilateral renal atrophy.  Small right renal cysts. 2. Negative for cholelithiasis. No intrahepatic biliary ductal dilation or hepatic mass lesion. 3. Mild enlargement of pancreatic duct, measuring 3 mm. This is only partially visualized. This could be further evaluated with followup nonemergent MRCP. Non-emergent MRI should be deferred until patient has been discharged for the acute illness, and can optimally cooperate with positioning and breath-holding instructions. 4. Small amount of ascites. 5. Tiny gallbladder polyp.   Electronically Signed   By: Charolette Child.D.  On: 09/09/2013 19:27    I have reviewed the patient's current medications.  Assessment/Plan: 1 ESRD stable 2 HTN stable 3 Anemia on meds 4 Influ 5 Pneumonitis viral vs other 6 ^ LFTs worsening  ? Flu meds, vs viral vs other P HD Fri, epo, AB, check CXR    LOS: 3 days   Mildred Tuccillo L 09/11/2013,9:00 AM

## 2013-09-11 NOTE — Progress Notes (Signed)
CBG: 56  Treatment: 15 GM carbohydrate snack  Symptoms: None  Follow-up CBG: Time:1326 CBG Result:95  Possible Reasons for Event: Inadequate meal intake and Other: Patient was NPO prior to morning procedure.  Comments/MD notified:Dr. Feliz-Ortiz notified. No new orders given at this time. Will continue to encourage patient's PO intake of snack/food.

## 2013-09-11 NOTE — Progress Notes (Addendum)
    Subjective:  Denies CP or dyspnea; complains of cough   Objective:  Filed Vitals:   09/10/13 1805 09/10/13 1855 09/10/13 2158 09/11/13 0501  BP: 125/86 127/51 134/61 148/60  Pulse: 89 87 90 66  Temp:  98.4 F (36.9 C) 97.9 F (36.6 C) 98.7 F (37.1 C)  TempSrc:   Oral Oral  Resp: 17  18 17   Height:      Weight: 156 lb 8.4 oz (71 kg)     SpO2:  98% 98% 97%    Intake/Output from previous day:  Intake/Output Summary (Last 24 hours) at 09/11/13 1610 Last data filed at 09/10/13 2349  Gross per 24 hour  Intake    243 ml  Output   2006 ml  Net  -1763 ml    Physical Exam: Physical exam: Well-developed well-nourished in no acute distress.  Skin is warm and dry.  HEENT is normal.  Neck is supple.  Chest is clear to auscultation with normal expansion.  Cardiovascular exam is regular rate and rhythm.  Abdominal exam nontender or distended. No masses palpated. Extremities show no edema. neuro grossly intact    Lab Results: Basic Metabolic Panel:  Recent Labs  96/04/54 0420 09/11/13 0420  NA 138  139 134*  K 4.2  4.1 4.6  CL 94*  95* 92*  CO2 23  23 21   GLUCOSE 74  73 285*  BUN 26*  26* 20  CREATININE 3.76*  3.76* 2.53*  CALCIUM 8.5  8.5 8.5  PHOS 3.1 2.2*   CBC:  Recent Labs  09/09/13 0006  09/10/13 0420 09/11/13 0420  WBC 14.6*  < > 21.3* 15.6*  NEUTROABS 13.0*  --   --   --   HGB 10.0*  < > 10.3* 10.5*  HCT 30.0*  < > 31.1* 31.2*  MCV 92.6  < > 92.8 92.3  PLT 216  < > 176 182  < > = values in this interval not displayed. Cardiac Enzymes:  Recent Labs  09/09/13 0045 09/09/13 0557 09/09/13 1005 09/09/13 1951  CKTOTAL 155  --   --   --   CKMB 3.3  --   --   --   TROPONINI 1.98* 1.34* 1.50* 1.87*     Assessment/Plan:  Elevated Troponin -- no CP, EKG with no acute changes  -- Possibly related to demand ischemia or renal failure. -- Continue aspirin and BB, no statin in the setting of transamintitis  -- LV function normal on  echo; plan nuclear study when he improves from flu. Could be performed tomorrow or Friday. Acute Congestive Heart Failure  -- Improved with dialysis; normal LV function on echo. HTN  -- Continue BB - dose reduced by nephrology. Influenza B  -- Continue Tamiflu per IM  Diabetes mellitus Type 2  Continue SSI -- per IM  ESRD on HD MWF  --Nephrology following  Transaminitis  --Eval per primary service. -- No statin   Olga Millers 09/11/2013, 7:12 AM   Discussed with daughter; will try and arrange nuclear study today. Olga Millers

## 2013-09-11 NOTE — Progress Notes (Signed)
PT Cancellation Note  Patient Details Name: Steven Forbes MRN: [id number] DOB: 26-Mar-1926   Cancelled Treatment:    Reason Eval/Treat Not Completed: Patient at procedure or test/unavailable. Will follow up on friday   Fredrich Birks 09/11/2013, 12:15 PM

## 2013-09-11 NOTE — Progress Notes (Signed)
[facility] PROGRESS NOTE Interim History: 79 y.o. male with Past medical history of hypertension, diabetes mellitus, ESRD on dialysis Monday Wednesday Friday, diabetes mellitus. The patient was brought in by his family member was noted that since last 3 days he has been having worsening cough with fever and shortness of breath. Patient was brought in on Friday after his dialysis with tachycardia. On reviewing the note he had an episode of bradycardia and then tachycardia with heart rate in 130, 3.4 L of fluid was removed.  Assessment/Plan: NSTEMI (non-ST elevated myocardial infarction) - Consulted cardiology cardiac enzymes trending up. - Possibly related to demand ischemia or renal failure.  - Continue aspirin and BB, no statin in the setting of transamintitis  - LV function normal on echo; plan nuclear study when he improves from flu. Stress test 12.24.2014.  Protein-calorie malnutrition, severe - ensure TID.  Transaminitis - ? Due to NSTEMI  Acute CHF: - Improved with HD.  ESRD on dialysis/Secondary hyperparathyroidism: -  Per renal.   Influenza B - cont tamilfu.  DIABETES MELLITUS - Bg is increasing. - d/c solumedrol, as I see no indication. - start low dose levemir.   Code Status: Full Code  DVT Prophylaxis: Heparin SQ  Family Communication: Discussed with wife and daughter at bedside. Asked them to discuss flu exposure with their respective physicians.  Disposition Plan: Not ready for discharge. Will return home when ready. PT/OT.    Consultants:  cardiology  Procedures: Stress test   Antibiotics:  Tamiflu  HPI/Subjective: No complains  Objective: Filed Vitals:   09/10/13 1855 09/10/13 2158 09/11/13 0501 09/11/13 0820  BP: 127/51 134/61 148/60 139/70  Pulse: 87 90 66 86  Temp: 98.4 F (36.9 C) 97.9 F (36.6 C) 98.7 F (37.1 C) 97.6 F (36.4 C)  TempSrc:  Oral Oral Oral  Resp:  18 17 19   Height:      Weight:      SpO2: 98% 98% 97% 96%     Intake/Output Summary (Last 24 hours) at 09/11/13 1044 Last data filed at 09/11/13 0800  Gross per 24 hour  Intake    243 ml  Output   2006 ml  Net  -1763 ml   Filed Weights   09/09/13 2219 09/10/13 1345 09/10/13 1805  Weight: 74.208 kg (163 lb 9.6 oz) 73 kg (160 lb 15 oz) 71 kg (156 lb 8.4 oz)    Exam:  General: Alert, awake, oriented x3, in no acute distress.  HEENT: No bruits, no goiter.  Heart: Regular rate and rhythm, without murmurs, rubs, gallops.  Lungs: Good air movement, bilateral air movement.  Abdomen: Soft, nontender, nondistended, positive bowel sounds.  Neuro: Grossly intact, nonfocal.   Data Reviewed: Basic Metabolic Panel:  Recent Labs Lab 09/09/13 0006 09/09/13 0557 09/10/13 0420 09/11/13 0420  NA 136 135 138  139 134*  K 3.2* 3.7 4.2  4.1 4.6  CL 93* 95* 94*  95* 92*  CO2 27 25 23  23 21   GLUCOSE 80 125* 74  73 285*  BUN 38* 41* 26*  26* 20  CREATININE 6.52* 6.68* 3.76*  3.76* 2.53*  CALCIUM 8.7 8.7 8.5  8.5 8.5  PHOS  --   --  3.1 2.2*   Liver Function Tests:  Recent Labs Lab 09/09/13 0557 09/10/13 0420 09/11/13 0420  AST 139* 172* 227*  ALT 109* 121* 159*  ALKPHOS 414* 444* 461*  BILITOT 0.7 1.2 1.2  PROT 7.0 7.3 7.5  ALBUMIN 2.4* 2.5*  2.5* 2.6*  No results found for this basename: LIPASE, AMYLASE,  in the last 168 hours  Recent Labs Lab 09/09/13 1030  AMMONIA 32   CBC:  Recent Labs Lab 09/09/13 0006 09/09/13 0557 09/10/13 0420 09/11/13 0420  WBC 14.6* 16.7* 21.3* 15.6*  NEUTROABS 13.0*  --   --   --   HGB 10.0* 10.0* 10.3* 10.5*  HCT 30.0* 30.4* 31.1* 31.2*  MCV 92.6 92.7 92.8 92.3  PLT 216 190 176 182   Cardiac Enzymes:  Recent Labs Lab 09/09/13 0045 09/09/13 0557 09/09/13 1005 09/09/13 1951  CKTOTAL 155  --   --   --   CKMB 3.3  --   --   --   TROPONINI 1.98* 1.34* 1.50* 1.87*   BNP (last 3 results)  Recent Labs  09/09/13 0006  PROBNP >70000.0*   CBG:  Recent Labs Lab  09/09/13 2215 09/10/13 0743 09/10/13 1151 09/10/13 2248 09/11/13 0818  GLUCAP 199* 73 150* 213* 248*    Recent Results (from the past 240 hour(s))  CULTURE, BLOOD (ROUTINE X 2)     Status: None   Collection Time    09/09/13 12:06 AM      Result Value Range Status   Specimen Description BLOOD RIGHT UPPER ARM   Final   Special Requests BOTTLES DRAWN AEROBIC AND ANAEROBIC 5CC EACH   Final   Culture  Setup Time     Final   Value: 09/09/2013 09:21     Performed at [facility]   Culture     Final   Value:        BLOOD CULTURE RECEIVED NO GROWTH TO DATE CULTURE WILL BE HELD FOR 5 DAYS BEFORE ISSUING A FINAL NEGATIVE REPORT     Performed at [facility]   Report Status PENDING   Incomplete  CULTURE, BLOOD (ROUTINE X 2)     Status: None   Collection Time    09/09/13 12:45 AM      Result Value Range Status   Specimen Description BLOOD RIGHT ARM   Final   Special Requests BOTTLES DRAWN AEROBIC AND ANAEROBIC 5CC EACH   Final   Culture  Setup Time     Final   Value: 09/09/2013 09:22     Performed at [facility]   Culture     Final   Value:        BLOOD CULTURE RECEIVED NO GROWTH TO DATE CULTURE WILL BE HELD FOR 5 DAYS BEFORE ISSUING A FINAL NEGATIVE REPORT     Performed at [facility]   Report Status PENDING   Incomplete     Studies: Dg Chest 2 View  09/11/2013   CLINICAL DATA:  Shortness of breath, cough.  EXAM: CHEST  2 VIEW  COMPARISON:  September 10, 2013.  FINDINGS: Stable cardiomegaly. No pneumothorax or pleural effusion is noted. Stable bilateral basilar opacities are noted consistent with edema or subsegmental atelectasis. Bony thorax is intact.  IMPRESSION: Stable bilateral basilar opacities as described above.   Electronically Signed   By: Roque Lias M.D.   On: 09/11/2013 09:33   Dg Chest 2 View  09/10/2013   CLINICAL DATA:  Evaluate CHF  EXAM: CHEST  2 VIEW  COMPARISON:  09/09/2013  FINDINGS: Low lung volumes. The cardiac silhouette is  enlarged. Aorta is tortuous. There is decreased conspicuity of the interstitial prominence and perihilar opacities. Decreased conspicuity of the bibasilar densities. No new focal regions of consolidation. The osseous structures are grossly unremarkable.  IMPRESSION: Improved interstitial infiltrate consistent with improved pulmonary edema.   Electronically Signed   By: Salome Holmes M.D.   On: 09/10/2013 12:19   US Abdomen Complete  09/09/2013   CLINICAL DATA:  Abnormal liver function tests.  EXAM: ULTRASOUND ABDOMEN COMPLETE  COMPARISON:  04/08/2007.  FINDINGS: Gallbladder:  No stones. Small gallbladder polyp measuring 4 mm. No sonographic Murphy's sign. Gallbladder wall measures 3 mm, normal.  Common bile duct:  Diameter: 2 mm, normal.  Liver:  No focal lesion identified. Within normal limits in parenchymal echogenicity.  IVC:  No abnormality visualized.  Pancreas:  Mildly enlarged pancreatic duct measuring 3 mm. No mass lesion is identified.  Spleen:  8.8 cm.  Normal echotexture.  Right Kidney:  Length: 7.4 cm. Atrophic with small simple cysts measuring 1 cm or less.  Left Kidney:  Length: 7.4 cm.  Atrophic appearance.  Abdominal aorta:  No aneurysm visualized.  Other findings:  Tiny amount of ascites adjacent to liver margin.  IMPRESSION: 1. Bilateral renal atrophy.  Small right renal cysts. 2. Negative for cholelithiasis. No intrahepatic biliary ductal dilation or hepatic mass lesion. 3. Mild enlargement of pancreatic duct, measuring 3 mm. This is only partially visualized. This could be further evaluated with followup nonemergent MRCP. Non-emergent MRI should be deferred until patient has been discharged for the acute illness, and can optimally cooperate with positioning and breath-holding instructions. 4. Small amount of ascites. 5. Tiny gallbladder polyp.   Electronically Signed   By: Andreas Newport M.D.   On: 09/09/2013 19:27    Scheduled Meds: . aspirin EC  325 mg Oral Daily  . atorvastatin  20  mg Oral q1800  . ceFEPime (MAXIPIME) IV  2 g Intravenous Q M,W,F-2000  . darbepoetin (ARANESP) injection - DIALYSIS  100 mcg Intravenous Q Mon-HD  . feeding supplement (NEPRO CARB STEADY)  237 mL Oral BID BM  . guaiFENesin  600 mg Oral BID  . heparin  5,000 Units Subcutaneous Q8H  . insulin aspart  0-15 Units Subcutaneous TID WC  . insulin aspart  0-5 Units Subcutaneous QHS  . labetalol  100 mg Oral BID  . multivitamin  1 tablet Oral QHS  . oseltamivir  30 mg Oral Daily  . pantoprazole  40 mg Oral Daily  . predniSONE  20 mg Oral Q breakfast  . regadenoson      . sodium chloride  3 mL Intravenous Q12H  . vancomycin  750 mg Intravenous Q M,W,F-HD   Continuous Infusions:    Marinda Elk  [facility] Pager [phone]. If 8PM-8AM, please contact night-coverage at [email or web address], password [facility] 09/11/2013, 10:44 AM  LOS: 3 days

## 2013-09-12 ENCOUNTER — Inpatient Hospital Stay (HOSPITAL_COMMUNITY): Payer: Medicare Other

## 2013-09-12 LAB — DIFFERENTIAL
Basophils Absolute: 0 10*3/uL (ref 0.0–0.1)
Basophils Relative: 0 % (ref 0–1)
Eosinophils Absolute: 0.1 10*3/uL (ref 0.0–0.7)
Lymphocytes Relative: 7 % — ABNORMAL LOW (ref 12–46)
Monocytes Absolute: 1.2 10*3/uL — ABNORMAL HIGH (ref 0.1–1.0)
Monocytes Relative: 8 % (ref 3–12)
Neutro Abs: 13 10*3/uL — ABNORMAL HIGH (ref 1.7–7.7)
Neutrophils Relative %: 85 % — ABNORMAL HIGH (ref 43–77)

## 2013-09-12 LAB — GLUCOSE, CAPILLARY
Glucose-Capillary: 59 mg/dL — ABNORMAL LOW (ref 70–99)
Glucose-Capillary: 296 mg/dL — ABNORMAL HIGH (ref 70–99)

## 2013-09-12 LAB — CBC
Hemoglobin: 10.9 g/dL — ABNORMAL LOW (ref 13.0–17.0)
MCH: 31.1 pg (ref 26.0–34.0)
MCHC: 34.1 g/dL (ref 30.0–36.0)
Platelets: 182 10*3/uL (ref 150–400)
RBC: 3.51 MIL/uL — ABNORMAL LOW (ref 4.22–5.81)
RDW: 18.1 % — ABNORMAL HIGH (ref 11.5–15.5)
WBC: 15.3 10*3/uL — ABNORMAL HIGH (ref 4.0–10.5)

## 2013-09-12 LAB — COMPREHENSIVE METABOLIC PANEL
AST: 221 U/L — ABNORMAL HIGH (ref 0–37)
Albumin: 2.4 g/dL — ABNORMAL LOW (ref 3.5–5.2)
Alkaline Phosphatase: 426 U/L — ABNORMAL HIGH (ref 39–117)
BUN: 35 mg/dL — ABNORMAL HIGH (ref 6–23)
Creatinine, Ser: 4.26 mg/dL — ABNORMAL HIGH (ref 0.50–1.35)
Glucose, Bld: 69 mg/dL — ABNORMAL LOW (ref 70–99)
Potassium: 3.5 mEq/L (ref 3.5–5.1)
Total Protein: 7.4 g/dL (ref 6.0–8.3)

## 2013-09-12 LAB — PHOSPHORUS: Phosphorus: 1.9 mg/dL — ABNORMAL LOW (ref 2.3–4.6)

## 2013-09-12 MED ORDER — DEXTROSE 5 % IV SOLN
2.0000 g | Freq: Once | INTRAVENOUS | Status: DC
  Filled 2013-09-12: qty 2

## 2013-09-12 MED ORDER — LORAZEPAM 2 MG/ML IJ SOLN
INTRAMUSCULAR | Status: AC
  Administered 2013-09-12: 0.25 mg via INTRAVENOUS
  Filled 2013-09-12: qty 1

## 2013-09-12 MED ORDER — LORAZEPAM 2 MG/ML IJ SOLN
0.2500 mg | Freq: Once | INTRAMUSCULAR | Status: AC
  Administered 2013-09-12: 0.25 mg via INTRAVENOUS

## 2013-09-12 MED ORDER — VANCOMYCIN HCL IN DEXTROSE 750-5 MG/150ML-% IV SOLN
750.0000 mg | INTRAVENOUS | Status: DC
  Filled 2013-09-12 (×2): qty 150

## 2013-09-12 MED ORDER — DEXTROSE 50 % IV SOLN: 25.0000 mL | Freq: Once | INTRAVENOUS | Status: AC | PRN

## 2013-09-12 MED ORDER — HALOPERIDOL LACTATE 5 MG/ML IJ SOLN: 1.0000 mg | Freq: Four times a day (QID) | INTRAMUSCULAR | Status: DC | PRN

## 2013-09-12 MED ORDER — DEXTROSE 50 % IV SOLN
INTRAVENOUS | Status: AC
  Administered 2013-09-12: 50 mL
  Filled 2013-09-12: qty 50

## 2013-09-12 NOTE — Progress Notes (Signed)
CBG: 59  Treatment: D50 IV 25 mL  Symptoms: None  Follow-up CBG: Time:08:50 CBG Result:135  Possible Reasons for Event: Inadequate meal intake, pt is NPO for Abd U/S.  Comments/MD notified:Dr. Feliz-Ortiz notified via page. This morning's dose of Levemir was discontinued. Will continue to monitor patient and encourage PO intake of food/liquids after his procedure is done.    Audie Pinto

## 2013-09-12 NOTE — Progress Notes (Addendum)
[facility] PROGRESS NOTE Interim History: 79 y.o. male with Past medical history of hypertension, diabetes mellitus, ESRD on dialysis Monday Wednesday Friday, diabetes mellitus. The patient was brought in by his family member was noted that since last 3 days he has been having worsening cough with fever and shortness of breath. Patient was brought in on Friday after his dialysis with tachycardia. On reviewing the note he had an episode of bradycardia and then tachycardia with heart rate in 130, 3.4 L of fluid was removed.  Assessment/Plan: Elevated Cardiac Markers: - Consulted cardiology:nuclear study shows very small area of mild anterior ischemia. I have reviewed images and do not think this is a high risk study. - Possibly related to demand ischemia or renal failure.  - Continue aspirin and BB, no statin in the setting of transamintitis  - LV function normal on echo; plan nuclear study when he improves from flu. Stress test 12.24.2014.  Acte confusional state: - d/c Ambien and ativan, use haldol.  Protein-calorie malnutrition, severe - ensure TID.  Transaminitis - cont to be elevated. Hepatitis panel negative, check abd U/s - no statins  Acute CHF: - Improved with HD.  ESRD on dialysis/Secondary hyperparathyroidism: -  Per renal.   Influenza B - cont tamilfu.  DIABETES MELLITUS - Bg is increasing. - d/c solumedrol, as I see no indication. - start low dose levemir.   Code Status: Full Code  DVT Prophylaxis: Heparin SQ  Family Communication: Discussed with wife and daughter at bedside. Asked them to discuss flu exposure with their respective physicians.  Disposition Plan: Not ready for discharge. Will return home when ready. PT/OT.    Consultants:  cardiology  Procedures: Stress test   Antibiotics:  Tamiflu  HPI/Subjective: No complains  Objective: Filed Vitals:   09/11/13 2152 09/11/13 2211 09/12/13 0504 09/12/13 1006  BP: 160/73 115/68 149/68  172/67  Pulse: 81 79 80 83  Temp: 97.8 F (36.6 C)  97.9 F (36.6 C) 98 F (36.7 C)  TempSrc: Oral  Oral Oral  Resp: 20 18 18 18   Height:      Weight:      SpO2: 97% 96% 94% 96%    Intake/Output Summary (Last 24 hours) at 09/12/13 1008 Last data filed at 09/11/13 1817  Gross per 24 hour  Intake    930 ml  Output      0 ml  Net    930 ml   Filed Weights   09/09/13 2219 09/10/13 1345 09/10/13 1805  Weight: 74.208 kg (163 lb 9.6 oz) 73 kg (160 lb 15 oz) 71 kg (156 lb 8.4 oz)    Exam:  General: Alert, awake, oriented x3, in no acute distress.  HEENT: No bruits, no goiter.  Heart: Regular rate and rhythm, without murmurs, rubs, gallops.  Lungs: Good air movement, bilateral air movement.  Abdomen: Soft, nontender, nondistended, positive bowel sounds.  Neuro: Grossly intact, nonfocal.   Data Reviewed: Basic Metabolic Panel:  Recent Labs Lab 09/09/13 0006 09/09/13 0557 09/10/13 0420 09/11/13 0420 09/12/13 0531  NA 136 135 138  139 134* 136  K 3.2* 3.7 4.2  4.1 4.6 3.5  CL 93* 95* 94*  95* 92* 96  CO2 27 25 23  23 21 24   GLUCOSE 80 125* 74  73 285* 69*  BUN 38* 41* 26*  26* 20 35*  CREATININE 6.52* 6.68* 3.76*  3.76* 2.53* 4.26*  CALCIUM 8.7 8.7 8.5  8.5 8.5 8.6  PHOS  --   --  3.1 2.2* 1.9*   Liver Function Tests:  Recent Labs Lab 09/09/13 0557 09/10/13 0420 09/11/13 0420 09/12/13 0531  AST 139* 172* 227* 221*  ALT 109* 121* 159* 171*  ALKPHOS 414* 444* 461* 426*  BILITOT 0.7 1.2 1.2 0.8  PROT 7.0 7.3 7.5 7.4  ALBUMIN 2.4* 2.5*  2.5* 2.6* 2.4*   No results found for this basename: LIPASE, AMYLASE,  in the last 168 hours  Recent Labs Lab 09/09/13 1030  AMMONIA 32   CBC:  Recent Labs Lab 09/09/13 0006 09/09/13 0557 09/10/13 0420 09/11/13 0420 09/12/13 0531  WBC 14.6* 16.7* 21.3* 15.6* 15.3*  NEUTROABS 13.0*  --   --   --  13.0*  HGB 10.0* 10.0* 10.3* 10.5* 10.9*  HCT 30.0* 30.4* 31.1* 31.2* 32.0*  MCV 92.6 92.7 92.8 92.3 91.2   PLT 216 190 176 182 182   Cardiac Enzymes:  Recent Labs Lab 09/09/13 0045 09/09/13 0557 09/09/13 1005 09/09/13 1951  CKTOTAL 155  --   --   --   CKMB 3.3  --   --   --   TROPONINI 1.98* 1.34* 1.50* 1.87*   BNP (last 3 results)  Recent Labs  09/09/13 0006  PROBNP >70000.0*   CBG:  Recent Labs Lab 09/11/13 1324 09/11/13 1708 09/11/13 2147 09/12/13 0805 09/12/13 0850  GLUCAP 95 203* 108* 59* 135*    Recent Results (from the past 240 hour(s))  CULTURE, BLOOD (ROUTINE X 2)     Status: None   Collection Time    09/09/13 12:06 AM      Result Value Range Status   Specimen Description BLOOD RIGHT UPPER ARM   Final   Special Requests BOTTLES DRAWN AEROBIC AND ANAEROBIC 5CC EACH   Final   Culture  Setup Time     Final   Value: 09/09/2013 09:21     Performed at [facility]   Culture     Final   Value:        BLOOD CULTURE RECEIVED NO GROWTH TO DATE CULTURE WILL BE HELD FOR 5 DAYS BEFORE ISSUING A FINAL NEGATIVE REPORT     Performed at [facility]   Report Status PENDING   Incomplete  CULTURE, BLOOD (ROUTINE X 2)     Status: None   Collection Time    09/09/13 12:45 AM      Result Value Range Status   Specimen Description BLOOD RIGHT ARM   Final   Special Requests BOTTLES DRAWN AEROBIC AND ANAEROBIC 5CC EACH   Final   Culture  Setup Time     Final   Value: 09/09/2013 09:22     Performed at [facility]   Culture     Final   Value:        BLOOD CULTURE RECEIVED NO GROWTH TO DATE CULTURE WILL BE HELD FOR 5 DAYS BEFORE ISSUING A FINAL NEGATIVE REPORT     Performed at [facility]   Report Status PENDING   Incomplete     Studies: Dg Chest 2 View  09/11/2013   CLINICAL DATA:  Shortness of breath, cough.  EXAM: CHEST  2 VIEW  COMPARISON:  September 10, 2013.  FINDINGS: Stable cardiomegaly. No pneumothorax or pleural effusion is noted. Stable bilateral basilar opacities are noted consistent with edema or subsegmental atelectasis. Bony  thorax is intact.  IMPRESSION: Stable bilateral basilar opacities as described above.   Electronically Signed   By: Roque Lias M.D.   On: 09/11/2013 09:33  Dg Chest 2 View  09/10/2013   CLINICAL DATA:  Evaluate CHF  EXAM: CHEST  2 VIEW  COMPARISON:  09/09/2013  FINDINGS: Low lung volumes. The cardiac silhouette is enlarged. Aorta is tortuous. There is decreased conspicuity of the interstitial prominence and perihilar opacities. Decreased conspicuity of the bibasilar densities. No new focal regions of consolidation. The osseous structures are grossly unremarkable.  IMPRESSION: Improved interstitial infiltrate consistent with improved pulmonary edema.   Electronically Signed   By: Salome Holmes M.D.   On: 09/10/2013 12:19   Nm Myocar Multi W/spect W/wall Motion / Ef  09/11/2013   CLINICAL DATA:  Chest pain, history diabetes, hypertension, hyperlipidemia, end-stage renal disease on dialysis, past history of MI  EXAM: MYOCARDIAL IMAGING WITH SPECT (REST AND PHARMACOLOGIC-STRESS)  GATED LEFT VENTRICULAR WALL MOTION STUDY  LEFT VENTRICULAR EJECTION FRACTION  TECHNIQUE: Standard myocardial SPECT imaging was performed after resting intravenous injection of 10 mCi Tc-1m sestamibi. Subsequently, intravenous infusion of Lexiscan was performed under the supervision of the Cardiology staff. At peak effect of the drug, 30 mCi Tc-76m sestamibi was injected intravenously and standard myocardial SPECT imaging was performed. Quantitative gated imaging was also performed to evaluate left ventricular wall motion, and estimate left ventricular ejection fraction.  COMPARISON:  None  FINDINGS: Myocardial perfusion SPECT images obtained following pharmacologic stress revealed a subtle area diminished perfusion at the anteroseptal wall the left ventricle.  This area demonstrates improved perfusion on resting images.  Dilatation of the right ventricle noted.  No pulmonary uptake of tracer.  Mildly decreased left ventricular  ejection fraction of 44% is calculated on gated SPECT images following pharmacologic stress. This is derived from an end-diastolic volume calculation of 137 mL and an end systolic volume of 77 mL. Wall motion analysis reveals mild dilatation of the left ventricle. No focal wall motion abnormalities identified.  IMPRESSION: Subtle area of partially reversible diminished perfusion at the anteroseptal wall of the left ventricle.  Mildly decreased left ventricular ejection fraction of 44%.  Normal left ventricular wall motion.   Electronically Signed   By: Ulyses Southward M.D.   On: 09/11/2013 13:37    Scheduled Meds: . aspirin EC  325 mg Oral Daily  . darbepoetin (ARANESP) injection - DIALYSIS  100 mcg Intravenous Q Mon-HD  . feeding supplement (NEPRO CARB STEADY)  237 mL Oral BID BM  . guaiFENesin  600 mg Oral BID  . heparin  5,000 Units Subcutaneous Q8H  . insulin aspart  0-15 Units Subcutaneous TID WC  . insulin aspart  0-5 Units Subcutaneous QHS  . insulin detemir  5 Units Subcutaneous BID  . labetalol  100 mg Oral BID  . multivitamin  1 tablet Oral QHS  . [START ON 09/13/2013] oseltamivir  30 mg Oral Daily  . pantoprazole  40 mg Oral Daily  . sodium chloride  3 mL Intravenous Q12H   Continuous Infusions:    Marinda Elk  [facility] Pager [phone]. If 8PM-8AM, please contact night-coverage at [email or web address], password [facility] 09/12/2013, 10:08 AM  LOS: 4 days

## 2013-09-12 NOTE — Progress Notes (Signed)
Subjective: Interval History: none.  Objective: Vital signs in last 24 hours: Temp:  [97.8 F (36.6 C)-98.3 F (36.8 C)] 97.9 F (36.6 C) (12/25 0504) Pulse Rate:  [79-86] 80 (12/25 0504) Resp:  [18-20] 18 (12/25 0504) BP: (115-160)/(64-74) 149/68 mmHg (12/25 0504) SpO2:  [94 %-97 %] 94 % (12/25 0504) Weight change:   Intake/Output from previous day: 12/24 0701 - 12/25 0700 In: 930 [P.O.:720; IV Piggyback:200] Out: -  Intake/Output this shift:    General appearance: cooperates but does not arrouse and goes back to sleep Resp: rales bibasilar and rhonchi bibasilar Cardio: S1, S2 normal and systolic murmur: holosystolic 2/6, blowing at apex GI: pos bs liver down 4 cm Extremities: AVF B&T, skin dry  Lab Results:  Recent Labs  09/11/13 0420 09/12/13 0531  WBC 15.6* 15.3*  HGB 10.5* 10.9*  HCT 31.2* 32.0*  PLT 182 182   BMET:  Recent Labs  09/11/13 0420 09/12/13 0531  NA 134* 136  K 4.6 3.5  CL 92* 96  CO2 21 24  GLUCOSE 285* 69*  BUN 20 35*  CREATININE 2.53* 4.26*  CALCIUM 8.5 8.6   No results found for this basename: PTH,  in the last 72 hours Iron Studies: No results found for this basename: IRON, TIBC, TRANSFERRIN, FERRITIN,  in the last 72 hours  Studies/Results: Dg Chest 2 View  09/11/2013   CLINICAL DATA:  Shortness of breath, cough.  EXAM: CHEST  2 VIEW  COMPARISON:  September 10, 2013.  FINDINGS: Stable cardiomegaly. No pneumothorax or pleural effusion is noted. Stable bilateral basilar opacities are noted consistent with edema or subsegmental atelectasis. Bony thorax is intact.  IMPRESSION: Stable bilateral basilar opacities as described above.   Electronically Signed   By: Roque Lias M.D.   On: 09/11/2013 09:33   Dg Chest 2 View  09/10/2013   CLINICAL DATA:  Evaluate CHF  EXAM: CHEST  2 VIEW  COMPARISON:  09/09/2013  FINDINGS: Low lung volumes. The cardiac silhouette is enlarged. Aorta is tortuous. There is decreased conspicuity of the  interstitial prominence and perihilar opacities. Decreased conspicuity of the bibasilar densities. No new focal regions of consolidation. The osseous structures are grossly unremarkable.  IMPRESSION: Improved interstitial infiltrate consistent with improved pulmonary edema.   Electronically Signed   By: Salome Holmes M.D.   On: 09/10/2013 12:19   Nm Myocar Multi W/spect W/wall Motion / Ef  09/11/2013   CLINICAL DATA:  Chest pain, history diabetes, hypertension, hyperlipidemia, end-stage renal disease on dialysis, past history of MI  EXAM: MYOCARDIAL IMAGING WITH SPECT (REST AND PHARMACOLOGIC-STRESS)  GATED LEFT VENTRICULAR WALL MOTION STUDY  LEFT VENTRICULAR EJECTION FRACTION  TECHNIQUE: Standard myocardial SPECT imaging was performed after resting intravenous injection of 10 mCi Tc-39m sestamibi. Subsequently, intravenous infusion of Lexiscan was performed under the supervision of the Cardiology staff. At peak effect of the drug, 30 mCi Tc-18m sestamibi was injected intravenously and standard myocardial SPECT imaging was performed. Quantitative gated imaging was also performed to evaluate left ventricular wall motion, and estimate left ventricular ejection fraction.  COMPARISON:  None  FINDINGS: Myocardial perfusion SPECT images obtained following pharmacologic stress revealed a subtle area diminished perfusion at the anteroseptal wall the left ventricle.  This area demonstrates improved perfusion on resting images.  Dilatation of the right ventricle noted.  No pulmonary uptake of tracer.  Mildly decreased left ventricular ejection fraction of 44% is calculated on gated SPECT images following pharmacologic stress. This is derived from an end-diastolic volume calculation of  137 mL and an end systolic volume of 77 mL. Wall motion analysis reveals mild dilatation of the left ventricle. No focal wall motion abnormalities identified.  IMPRESSION: Subtle area of partially reversible diminished perfusion at the  anteroseptal wall of the left ventricle.  Mildly decreased left ventricular ejection fraction of 44%.  Normal left ventricular wall motion.   Electronically Signed   By: Ulyses Southward M.D.   On: 09/11/2013 13:37    I have reviewed the patient's current medications.  Assessment/Plan: 1 ESRD stable 2 Bronchitis/influ  Improving 3 ^ LFTs stable 4 DM controlled 5 CAD per cards 6 Anemia 7 HPTH 8 PVD TMA P HD Fri, epo, follow LFTs.     LOS: 4 days   Steven Forbes L 09/12/2013,9:51 AM

## 2013-09-12 NOTE — Progress Notes (Signed)
    Subjective:  Denies CP or dyspnea; sleepy   Objective:  Filed Vitals:   09/11/13 1709 09/11/13 2152 09/11/13 2211 09/12/13 0504  BP: 137/66 160/73 115/68 149/68  Pulse: 85 81 79 80  Temp: 98 F (36.7 C) 97.8 F (36.6 C)  97.9 F (36.6 C)  TempSrc: Oral Oral  Oral  Resp: 20 20 18 18   Height:      Weight:      SpO2: 96% 97% 96% 94%    Intake/Output from previous day:  Intake/Output Summary (Last 24 hours) at 09/12/13 0615 Last data filed at 09/11/13 1817  Gross per 24 hour  Intake    930 ml  Output      0 ml  Net    930 ml    Physical Exam: Physical exam: Well-developed well-nourished in no acute distress.  Skin is warm and dry.  HEENT is normal.  Neck is supple.  Chest with rhonchi and mild expiratory wheeze Cardiovascular exam is regular rate and rhythm.  Abdominal exam nontender or distended. No masses palpated. Extremities show no edema. neuro grossly intact    Lab Results: Basic Metabolic Panel:  Recent Labs  16/10/96 0420 09/11/13 0420  NA 138  139 134*  K 4.2  4.1 4.6  CL 94*  95* 92*  CO2 23  23 21   GLUCOSE 74  73 285*  BUN 26*  26* 20  CREATININE 3.76*  3.76* 2.53*  CALCIUM 8.5  8.5 8.5  PHOS 3.1 2.2*   CBC:  Recent Labs  09/10/13 0420 09/11/13 0420  WBC 21.3* 15.6*  HGB 10.3* 10.5*  HCT 31.1* 31.2*  MCV 92.8 92.3  PLT 176 182   Cardiac Enzymes:  Recent Labs  09/09/13 1005 09/09/13 1951  TROPONINI 1.50* 1.87*     Assessment/Plan:  Elevated Troponin -- no CP, EKG with no acute changes  -- Possibly related to demand ischemia or renal failure. -- Given age and risk factors, he most likely has CAD. LV function normal on echo; nuclear study shows very small area of mild anterior ischemia. I have reviewed images and do not think this is a high risk study. Given comorbidities, would favor medical therapy. Continue ASA and beta blocker; would add statin later once LFTs improve. I will see back in office 2-4 weeks after  DC. We can consider cath in the future once he improves from infuenza if he develops symptoms. Acute Congestive Heart Failure  -- Improved with dialysis; normal LV function on echo. HTN  -- Continue BB - dose reduced by nephrology. Influenza B  -- Continue Tamiflu per IM  Diabetes mellitus Type 2  Continue SSI -- per IM  ESRD on HD MWF  --Nephrology following  Transaminitis  --Eval per primary service. -- No statin for now   Steven Forbes 09/12/2013, 6:15 AM   Discussed with daughter; will try and arrange nuclear study today. Steven Forbes

## 2013-09-13 DIAGNOSIS — E43 Unspecified severe protein-calorie malnutrition: Secondary | ICD-10-CM

## 2013-09-13 LAB — COMPREHENSIVE METABOLIC PANEL
ALT: 156 U/L — ABNORMAL HIGH (ref 0–53)
AST: 175 U/L — ABNORMAL HIGH (ref 0–37)
Alkaline Phosphatase: 416 U/L — ABNORMAL HIGH (ref 39–117)
BUN: 37 mg/dL — ABNORMAL HIGH (ref 6–23)
CO2: 27 mEq/L (ref 19–32)
Chloride: 97 mEq/L (ref 96–112)
Creatinine, Ser: 4.44 mg/dL — ABNORMAL HIGH (ref 0.50–1.35)
GFR calc Af Amer: 13 mL/min — ABNORMAL LOW (ref >90)
Glucose, Bld: 134 mg/dL — ABNORMAL HIGH (ref 70–99)
Potassium: 3.4 mEq/L — ABNORMAL LOW (ref 3.5–5.1)
Sodium: 139 mEq/L (ref 135–145)
Total Bilirubin: 0.7 mg/dL (ref 0.3–1.2)
Total Protein: 7.2 g/dL (ref 6.0–8.3)

## 2013-09-13 LAB — PHOSPHORUS: Phosphorus: 1.5 mg/dL — ABNORMAL LOW (ref 2.3–4.6)

## 2013-09-13 LAB — CBC
Hemoglobin: 10.7 g/dL — ABNORMAL LOW (ref 13.0–17.0)
MCHC: 33.8 g/dL (ref 30.0–36.0)
RBC: 3.51 MIL/uL — ABNORMAL LOW (ref 4.22–5.81)
RDW: 17.4 % — ABNORMAL HIGH (ref 11.5–15.5)
WBC: 14.3 10*3/uL — ABNORMAL HIGH (ref 4.0–10.5)

## 2013-09-13 MED ORDER — OSELTAMIVIR PHOSPHATE 30 MG PO CAPS: 30.0000 mg | ORAL_CAPSULE | Freq: Every day | ORAL | Status: DC

## 2013-09-13 MED ORDER — HEPARIN SODIUM (PORCINE) 1000 UNIT/ML DIALYSIS: 1000.0000 [IU] | INTRAMUSCULAR | Status: DC | PRN

## 2013-09-13 MED ORDER — SODIUM CHLORIDE 0.9 % IV SOLN: 100.0000 mL | INTRAVENOUS | Status: DC | PRN

## 2013-09-13 MED ORDER — NEPRO/CARBSTEADY PO LIQD
237.0000 mL | ORAL | Status: DC | PRN
  Filled 2013-09-13: qty 237

## 2013-09-13 MED ORDER — LIDOCAINE HCL (PF) 1 % IJ SOLN: 5.0000 mL | INTRAMUSCULAR | Status: DC | PRN

## 2013-09-13 MED ORDER — HYDROXYZINE HCL 10 MG PO TABS: 10.0000 mg | ORAL_TABLET | Freq: Three times a day (TID) | ORAL | Status: DC | PRN

## 2013-09-13 MED ORDER — PENTAFLUOROPROP-TETRAFLUOROETH EX AERO: 1.0000 "application " | INHALATION_SPRAY | CUTANEOUS | Status: DC | PRN

## 2013-09-13 MED ORDER — ALTEPLASE 2 MG IJ SOLR
2.0000 mg | Freq: Once | INTRAMUSCULAR | Status: DC | PRN
  Filled 2013-09-13: qty 2

## 2013-09-13 MED ORDER — LIDOCAINE-PRILOCAINE 2.5-2.5 % EX CREA
1.0000 "application " | TOPICAL_CREAM | CUTANEOUS | Status: DC | PRN
  Filled 2013-09-13: qty 5

## 2013-09-13 MED ORDER — HEPARIN SODIUM (PORCINE) 1000 UNIT/ML DIALYSIS: 100.0000 [IU]/kg | INTRAMUSCULAR | Status: DC | PRN

## 2013-09-13 MED ORDER — DARBEPOETIN ALFA-POLYSORBATE 100 MCG/0.5ML IJ SOLN
INTRAMUSCULAR | Status: AC
  Filled 2013-09-13: qty 0.5

## 2013-09-13 MED ORDER — ASPIRIN EC 81 MG PO TBEC: 81.0000 mg | DELAYED_RELEASE_TABLET | Freq: Every day | ORAL | Status: DC

## 2013-09-13 MED ORDER — DARBEPOETIN ALFA-POLYSORBATE 100 MCG/0.5ML IJ SOLN: 100.0000 ug | INTRAMUSCULAR | Status: AC

## 2013-09-13 NOTE — Discharge Summary (Signed)
Physician Discharge Summary  Steven Forbes JYN:[id number] DOB: 02/10/1926 DOA: 09/08/2013  PCP: Gwen Pounds, MD  Admit date: 09/08/2013 Discharge date: 09/13/2013  Time spent: 35 minutes  Recommendations for Outpatient Follow-up:  1. Follow up with cardiology as an outpatinet  Discharge Diagnoses:  Principal Problem:   NSTEMI (non-ST elevated myocardial infarction) Active Problems:   Acute CHF   Transaminitis   Protein-calorie malnutrition, severe   DIABETES MELLITUS   ESRD on dialysis   possible Pneumonia   Influenza B   Secondary hyperparathyroidism   Atherosclerotic peripheral vascular disease   Discharge Condition: stable  Diet recommendation: Renal diet  Filed Weights   09/10/13 1345 09/10/13 1805 09/13/13 0725  Weight: 73 kg (160 lb 15 oz) 71 kg (156 lb 8.4 oz) 69.9 kg (154 lb 1.6 oz)    History of present illness:  79 y.o. male with Past medical history of hypertension, diabetes mellitus, ESRD on dialysis Monday Wednesday Friday, diabetes mellitus.  The patient is coming from home.  The patient was brought in by his family member was noted that since last 3 days he has been having worsening cough with fever and shortness of breath.  Patient was brought in on Friday after his dialysis with tachycardia. On reviewing the note he had an episode of bradycardia and then tachycardia with heart rate in 130, 3.4 L of fluid was removed. On arrival to ED his heart rate stabilized and he was in normal sinus rhythm. And he was sent back home.  As per the family since Saturday there has been gradual decline in his mental status as well and he has been more tired and lethargic. Today they also hear some wheezing. The patient has a dry cough since last to 3 days.  The patient denies any complaint of chest pain or nausea or vomiting or diarrhea or leg swelling.  No active bleeding noted by the family.   Hospital Course:  Elevated Cardiac Markers:  - Consulted cardiology:nuclear  study shows very small area of mild anterior ischemia. I have reviewed images and do not think this is a high risk study.  - Possibly related to demand ischemia or renal failure.  - Continue aspirin and BB, no statin in the setting of transamintitis  - LV function normal on echo; plan nuclear study when he improves from flu. Stress test 12.24.2014.   Influenza B: - started empircally on vanc and cefepime. Once influenza PCR + dc'd. - cont tamilfu at home.   Acte confusional state:  - due to [place] and ativan. - d/c Ambien and ativan, use haldol.   Protein-calorie malnutrition, severe  - ensure TID.   Transaminitis  - cont to be elevated. Hepatitis panel negative, check abd U/s  - no statins. - Unclear etiology. ? Due to flu. - will need repeat hepatic function panel and further evalaution as an out patient if cont to be high in 3 weeks follow up.  Acute CHF:  - Improved with HD.   ESRD on dialysis/Secondary hyperparathyroidism:  - Per renal.   DIABETES MELLITUS  - Bg is increasing. Due to IV steroids. - d/c solumedrol, as I see no indication.    Procedures:  CXR  Consultations:  renal  Discharge Exam: Filed Vitals:   09/13/13 0900  BP: 155/73  Pulse: 83  Temp:   Resp:     General: A&Ox2 Cardiovascular: RRR Respiratory: good air movement CTA B/L  Discharge Instructions      Discharge Orders   Future Appointments Provider  Department Dept Phone   09/30/2013 11:10 AM Beatrice Lecher, PA-C [facility] [place] Office [phone]   01/21/2014 9:00 AM Sherrie George, MD [facility] [phone]   Future Orders Complete By Expires   Diet - low sodium heart healthy  As directed    Increase activity slowly  As directed        Medication List         aspirin EC 81 MG tablet  Take 1 tablet (81 mg total) by mouth daily.     darbepoetin 100 MCG/0.5ML Soln injection  Commonly known as:  ARANESP  Inject 0.5 mLs (100 mcg total)  into the vein every Monday with hemodialysis.     folic acid-vitamin b complex-vitamin c-selenium-zinc 3 MG Tabs tablet  Take 1 tablet by mouth daily.     HUMULIN N PEN [place]  Inject 12 Units into the skin 2 (two) times daily.     hydrOXYzine 10 MG tablet  Commonly known as:  ATARAX/VISTARIL  Take 1 tablet (10 mg total) by mouth 3 (three) times daily as needed for itching.     labetalol 300 MG tablet  Commonly known as:  NORMODYNE  Take 300 mg by mouth 3 (three) times daily.     omeprazole 20 MG capsule  Commonly known as:  PRILOSEC  Take 20 mg by mouth daily.     oseltamivir 30 MG capsule  Commonly known as:  TAMIFLU  Take 1 capsule (30 mg total) by mouth daily.     simvastatin 40 MG tablet  Commonly known as:  ZOCOR  Take 40 mg by mouth at bedtime.       No Known Allergies Follow-up Information   Follow up with Tereso Newcomer, PA-C On 09/30/2013. (See for Dr. Jens Som at at 11:10 am)    Specialty:  Physician Assistant   Contact information:   [address] [place] [address] [phone]        The results of significant diagnostics from this hospitalization (including imaging, microbiology, ancillary and laboratory) are listed below for reference.    Significant Diagnostic Studies: Dg Chest 2 View  09/11/2013   CLINICAL DATA:  Shortness of breath, cough.  EXAM: CHEST  2 VIEW  COMPARISON:  September 10, 2013.  FINDINGS: Stable cardiomegaly. No pneumothorax or pleural effusion is noted. Stable bilateral basilar opacities are noted consistent with edema or subsegmental atelectasis. Bony thorax is intact.  IMPRESSION: Stable bilateral basilar opacities as described above.   Electronically Signed   By: Roque Lias M.D.   On: 09/11/2013 09:33   Dg Chest 2 View  09/10/2013   CLINICAL DATA:  Evaluate CHF  EXAM: CHEST  2 VIEW  COMPARISON:  09/09/2013  FINDINGS: Low lung volumes. The cardiac silhouette is enlarged. Aorta is tortuous. There is decreased  conspicuity of the interstitial prominence and perihilar opacities. Decreased conspicuity of the bibasilar densities. No new focal regions of consolidation. The osseous structures are grossly unremarkable.  IMPRESSION: Improved interstitial infiltrate consistent with improved pulmonary edema.   Electronically Signed   By: Salome Holmes M.D.   On: 09/10/2013 12:19   US Abdomen Complete  09/09/2013   CLINICAL DATA:  Abnormal liver function tests.  EXAM: ULTRASOUND ABDOMEN COMPLETE  COMPARISON:  04/08/2007.  FINDINGS: Gallbladder:  No stones. Small gallbladder polyp measuring 4 mm. No sonographic Murphy's sign. Gallbladder wall measures 3 mm, normal.  Common bile duct:  Diameter: 2 mm, normal.  Liver:  No focal lesion identified. Within normal limits in parenchymal echogenicity.  IVC:  No abnormality visualized.  Pancreas:  Mildly enlarged pancreatic duct measuring 3 mm. No mass lesion is identified.  Spleen:  8.8 cm.  Normal echotexture.  Right Kidney:  Length: 7.4 cm. Atrophic with small simple cysts measuring 1 cm or less.  Left Kidney:  Length: 7.4 cm.  Atrophic appearance.  Abdominal aorta:  No aneurysm visualized.  Other findings:  Tiny amount of ascites adjacent to liver margin.  IMPRESSION: 1. Bilateral renal atrophy.  Small right renal cysts. 2. Negative for cholelithiasis. No intrahepatic biliary ductal dilation or hepatic mass lesion. 3. Mild enlargement of pancreatic duct, measuring 3 mm. This is only partially visualized. This could be further evaluated with followup nonemergent MRCP. Non-emergent MRI should be deferred until patient has been discharged for the acute illness, and can optimally cooperate with positioning and breath-holding instructions. 4. Small amount of ascites. 5. Tiny gallbladder polyp.   Electronically Signed   By: Andreas Newport M.D.   On: 09/09/2013 19:27   Nm Myocar Multi W/spect W/wall Motion / Ef  09/11/2013   CLINICAL DATA:  Chest pain, history diabetes, hypertension,  hyperlipidemia, end-stage renal disease on dialysis, past history of MI  EXAM: MYOCARDIAL IMAGING WITH SPECT (REST AND PHARMACOLOGIC-STRESS)  GATED LEFT VENTRICULAR WALL MOTION STUDY  LEFT VENTRICULAR EJECTION FRACTION  TECHNIQUE: Standard myocardial SPECT imaging was performed after resting intravenous injection of 10 mCi Tc-32m sestamibi. Subsequently, intravenous infusion of Lexiscan was performed under the supervision of the Cardiology staff. At peak effect of the drug, 30 mCi Tc-65m sestamibi was injected intravenously and standard myocardial SPECT imaging was performed. Quantitative gated imaging was also performed to evaluate left ventricular wall motion, and estimate left ventricular ejection fraction.  COMPARISON:  None  FINDINGS: Myocardial perfusion SPECT images obtained following pharmacologic stress revealed a subtle area diminished perfusion at the anteroseptal wall the left ventricle.  This area demonstrates improved perfusion on resting images.  Dilatation of the right ventricle noted.  No pulmonary uptake of tracer.  Mildly decreased left ventricular ejection fraction of 44% is calculated on gated SPECT images following pharmacologic stress. This is derived from an end-diastolic volume calculation of 137 mL and an end systolic volume of 77 mL. Wall motion analysis reveals mild dilatation of the left ventricle. No focal wall motion abnormalities identified.  IMPRESSION: Subtle area of partially reversible diminished perfusion at the anteroseptal wall of the left ventricle.  Mildly decreased left ventricular ejection fraction of 44%.  Normal left ventricular wall motion.   Electronically Signed   By: Ulyses Southward M.D.   On: 09/11/2013 13:37   Dg Chest Port 1 View  09/09/2013   CLINICAL DATA:  Short of breath.  EXAM: PORTABLE CHEST - 1 VIEW  COMPARISON:  Chest radiograph 05/26/2011.  FINDINGS: Cardiomegaly. Perihilar and basilar predominant airspace disease compatible with pulmonary edema and CHF.  Monitoring leads project over the chest. Aortic arch atherosclerosis.  IMPRESSION: Moderate CHF.   Electronically Signed   By: Andreas Newport M.D.   On: 09/09/2013 00:39    Microbiology: Recent Results (from the past 240 hour(s))  CULTURE, BLOOD (ROUTINE X 2)     Status: None   Collection Time    09/09/13 12:06 AM      Result Value Range Status   Specimen Description BLOOD RIGHT UPPER ARM   Final   Special Requests BOTTLES DRAWN AEROBIC AND ANAEROBIC [facility] EACH   Final   Culture  Setup  Time     Final   Value: 09/09/2013 09:21     Performed at [facility]   Culture     Final   Value:        BLOOD CULTURE RECEIVED NO GROWTH TO DATE CULTURE WILL BE HELD FOR 5 DAYS BEFORE ISSUING A FINAL NEGATIVE REPORT     Performed at [facility]   Report Status PENDING   Incomplete  CULTURE, BLOOD (ROUTINE X 2)     Status: None   Collection Time    09/09/13 12:45 AM      Result Value Range Status   Specimen Description BLOOD RIGHT ARM   Final   Special Requests BOTTLES DRAWN AEROBIC AND ANAEROBIC 5CC EACH   Final   Culture  Setup Time     Final   Value: 09/09/2013 09:22     Performed at [facility]   Culture     Final   Value:        BLOOD CULTURE RECEIVED NO GROWTH TO DATE CULTURE WILL BE HELD FOR 5 DAYS BEFORE ISSUING A FINAL NEGATIVE REPORT     Performed at [facility]   Report Status PENDING   Incomplete     Labs: Basic Metabolic Panel:  Recent Labs Lab 09/09/13 0557 09/10/13 0420 09/11/13 0420 09/12/13 0531 09/13/13 0810  NA 135 138  139 134* 136 139  K 3.7 4.2  4.1 4.6 3.5 3.4*  CL 95* 94*  95* 92* 96 97  CO2 25 23  23 21 24 27   GLUCOSE 125* 74  73 285* 69* 134*  BUN 41* 26*  26* 20 35* 37*  CREATININE 6.68* 3.76*  3.76* 2.53* 4.26* 4.44*  CALCIUM 8.7 8.5  8.5 8.5 8.6 8.1*  PHOS  --  3.1 2.2* 1.9* 1.5*   Liver Function Tests:  Recent Labs Lab 09/09/13 0557 09/10/13 0420 09/11/13 0420 09/12/13 0531 09/13/13 0810  AST 139*  172* 227* 221* 175*  ALT 109* 121* 159* 171* 156*  ALKPHOS 414* 444* 461* 426* 416*  BILITOT 0.7 1.2 1.2 0.8 0.7  PROT 7.0 7.3 7.5 7.4 7.2  ALBUMIN 2.4* 2.5*  2.5* 2.6* 2.4* 2.4*   No results found for this basename: LIPASE, AMYLASE,  in the last 168 hours  Recent Labs Lab 09/09/13 1030  AMMONIA 32   CBC:  Recent Labs Lab 09/09/13 0006 09/09/13 0557 09/10/13 0420 09/11/13 0420 09/12/13 0531 09/13/13 0810  WBC 14.6* 16.7* 21.3* 15.6* 15.3* 14.3*  NEUTROABS 13.0*  --   --   --  13.0*  --   HGB 10.0* 10.0* 10.3* 10.5* 10.9* 10.7*  HCT 30.0* 30.4* 31.1* 31.2* 32.0* 31.7*  MCV 92.6 92.7 92.8 92.3 91.2 90.3  PLT 216 190 176 182 182 193   Cardiac Enzymes:  Recent Labs Lab 09/09/13 0045 09/09/13 0557 09/09/13 1005 09/09/13 1951  CKTOTAL 155  --   --   --   CKMB 3.3  --   --   --   TROPONINI 1.98* 1.34* 1.50* 1.87*   BNP: BNP (last 3 results)  Recent Labs  09/09/13 0006  PROBNP >70000.0*   CBG:  Recent Labs Lab 09/12/13 0805 09/12/13 0850 09/12/13 1205 09/12/13 1655 09/12/13 2101  GLUCAP 59* 135* 80 98 296*       Signed:  FELIZ ORTIZ, Shelbia Scinto  [facility] 09/13/2013, 9:59 AM

## 2013-09-13 NOTE — Progress Notes (Signed)
ANTIBIOTIC CONSULT NOTE - FOLLOW UP  Pharmacy Consult for vancomycin and cefepime Indication: pneumonia  No Known Allergies  Patient Measurements: Height: 5\' 3"  (160 cm) Weight: 154 lb 1.6 oz (69.9 kg) IBW/kg (Calculated) : 56.9   Vital Signs: Temp: 97.3 F (36.3 C) (12/26 0725) Temp src: Oral (12/26 0725) BP: 155/73 mmHg (12/26 0900) Pulse Rate: 83 (12/26 0900) Intake/Output from previous day: 12/25 0701 - 12/26 0700 In: 613 [P.O.:600; I.V.:3] Out: -  Intake/Output from this shift:    Labs:  Recent Labs  09/11/13 0420 09/12/13 0531 09/13/13 0810  WBC 15.6* 15.3* 14.3*  HGB 10.5* 10.9* 10.7*  PLT 182 182 193  CREATININE 2.53* 4.26* 4.44*   Estimated Creatinine Clearance: 10.7 ml/min (by C-G formula based on Cr of 4.44). No results found for this basename: VANCOTROUGH, Leodis Binet, VANCORANDOM, GENTTROUGH, GENTPEAK, GENTRANDOM, TOBRATROUGH, TOBRAPEAK, TOBRARND, AMIKACINPEAK, AMIKACINTROU, AMIKACIN,  in the last 72 hours   Microbiology: Recent Results (from the past 720 hour(s))  CULTURE, BLOOD (ROUTINE X 2)     Status: None   Collection Time    09/09/13 12:06 AM      Result Value Range Status   Specimen Description BLOOD RIGHT UPPER ARM   Final   Special Requests BOTTLES DRAWN AEROBIC AND ANAEROBIC 5CC EACH   Final   Culture  Setup Time     Final   Value: 09/09/2013 09:21     Performed at [facility]   Culture     Final   Value:        BLOOD CULTURE RECEIVED NO GROWTH TO DATE CULTURE WILL BE HELD FOR 5 DAYS BEFORE ISSUING A FINAL NEGATIVE REPORT     Performed at [facility]   Report Status PENDING   Incomplete  CULTURE, BLOOD (ROUTINE X 2)     Status: None   Collection Time    09/09/13 12:45 AM      Result Value Range Status   Specimen Description BLOOD RIGHT ARM   Final   Special Requests BOTTLES DRAWN AEROBIC AND ANAEROBIC 5CC EACH   Final   Culture  Setup Time     Final   Value: 09/09/2013 09:22     Performed at [facility]   Culture     Final   Value:        BLOOD CULTURE RECEIVED NO GROWTH TO DATE CULTURE WILL BE HELD FOR 5 DAYS BEFORE ISSUING A FINAL NEGATIVE REPORT     Performed at [facility]   Report Status PENDING   Incomplete    Anti-infectives   Start     Dose/Rate Route Frequency Ordered Stop   09/13/13 2000  ceFEPIme (MAXIPIME) 2 g in dextrose 5 % 50 mL IVPB     2 g 100 mL/hr over 30 Minutes Intravenous  Once 09/12/13 1313     09/13/13 1200  vancomycin (VANCOCIN) IVPB 750 mg/150 ml premix     750 mg 150 mL/hr over 60 Minutes Intravenous Every M-W-F (Hemodialysis) 09/12/13 1313 09/16/13 1159   09/13/13 1000  oseltamivir (TAMIFLU) capsule 30 mg    Comments:  Give post dialysis.   30 mg Oral Daily 09/11/13 1431 09/14/13 0959   09/11/13 1230  vancomycin (VANCOCIN) IVPB 750 mg/150 ml premix     750 mg 150 mL/hr over 60 Minutes Intravenous  Once 09/11/13 1150 09/11/13 1440   09/11/13 1200  ceFEPIme (MAXIPIME) 2 g in dextrose 5 % 50 mL IVPB     2 g 100  mL/hr over 30 Minutes Intravenous  Once 09/11/13 1151 09/11/13 1346   09/09/13 1400  vancomycin (VANCOCIN) IVPB 750 mg/150 ml premix  Status:  Discontinued     750 mg 150 mL/hr over 60 Minutes Intravenous Every M-W-F (Hemodialysis) 09/09/13 1103 09/11/13 1150   09/09/13 1200  vancomycin (VANCOCIN) IVPB 1000 mg/200 mL premix  Status:  Discontinued     1,000 mg 200 mL/hr over 60 Minutes Intravenous Every M-W-F (Hemodialysis) 09/09/13 0214 09/09/13 1102   09/09/13 1000  oseltamivir (TAMIFLU) capsule 75 mg  Status:  Discontinued     75 mg Oral Daily 09/09/13 0926 09/09/13 0929   09/09/13 1000  oseltamivir (TAMIFLU) capsule 30 mg  Status:  Discontinued    Comments:  Give post dialysis.   30 mg Oral Daily 09/09/13 0929 09/11/13 1431   09/09/13 0445  ceFEPIme (MAXIPIME) 2 g in dextrose 5 % 50 mL IVPB     2 g 100 mL/hr over 30 Minutes Intravenous  Once 09/09/13 0440 09/09/13 0539   09/09/13 0230  ceFEPIme (MAXIPIME) 2 g in dextrose 5 % 50 mL IVPB   Status:  Discontinued     2 g 100 mL/hr over 30 Minutes Intravenous Every M-W-F (2000) 09/09/13 0215 09/11/13 1151   09/09/13 0215  vancomycin (VANCOCIN) 2,000 mg in sodium chloride 0.9 % 500 mL IVPB     2,000 mg 250 mL/hr over 120 Minutes Intravenous  Once 09/09/13 0214 09/09/13 0456   09/09/13 0145  vancomycin (VANCOCIN) IVPB 1000 mg/200 mL premix  Status:  Discontinued     1,000 mg 200 mL/hr over 60 Minutes Intravenous  Once 09/09/13 0133 09/09/13 0156   09/09/13 0145  gentamicin (GARAMYCIN) IVPB 80 mg  Status:  Discontinued     80 mg 100 mL/hr over 30 Minutes Intravenous  Once 09/09/13 0133 09/09/13 0156      Assessment: Patient is an 79 y.o M on vancomycin and cefepime for PNA.  Patient remains afebrile and WBC is trending down.   He is currently off regular HD schedule d/t the holidays. He received HD on 12/22, 12/23, and is currently in dialysis with plan for next session to be on Sunday.   12/22 tamiflu 30 post HD>>12/26  12/22 vanc>> 12/22 cefepime>>   12/22 influ B>> Positive 12/22 bcx x2>> ngtd  Goal of Therapy:  Pre-HD vancomycin level= 15-25  Plan:  1) vanc 750mg  IV x1 after HD today 2) cefepime 2gm IV x1 after HD today 3) f/u HD schedule, enter/redose abx based on HD schedule  Tavionna Grout P 09/13/2013,9:41 AM

## 2013-09-13 NOTE — Progress Notes (Signed)
Patient ID: Steven Forbes, male   DOB: 02/01/1926, 79 y.o.   MRN: [id number]  [facility] Progress Note    Subjective:   Seen on HD without complaints or distress   Objective:   BP 153/75  Pulse 86  Temp(Src) 98 F (36.7 C) (Oral)  Resp 18  Ht 5\' 3"  (1.6 m)  Wt 69.9 kg (154 lb 1.6 oz)  BMI 27.30 kg/m2  SpO2 97%  Intake/Output: I/O last 3 completed shifts: In: 613 [P.O.:600; I.V.:3; Other:10] Out: -    Intake/Output this shift:    Weight change:   Physical Exam: Gen:WD WN AAM in NAD CVS:no rub Resp:cta Abd:+BS, soft, NT/ND Ext:no edema, LUE AVF +T/B  Labs: BMET  Recent Labs Lab 09/09/13 0006 09/09/13 0557 09/10/13 0420 09/11/13 0420 09/12/13 0531  NA 136 135 138  139 134* 136  K 3.2* 3.7 4.2  4.1 4.6 3.5  CL 93* 95* 94*  95* 92* 96  CO2 27 25 23  23 21 24   GLUCOSE 80 125* 74  73 285* 69*  BUN 38* 41* 26*  26* 20 35*  CREATININE 6.52* 6.68* 3.76*  3.76* 2.53* 4.26*  ALBUMIN  --  2.4* 2.5*  2.5* 2.6* 2.4*  CALCIUM 8.7 8.7 8.5  8.5 8.5 8.6  PHOS  --   --  3.1 2.2* 1.9*   CBC  Recent Labs Lab 09/09/13 0006 09/09/13 0557 09/10/13 0420 09/11/13 0420 09/12/13 0531  WBC 14.6* 16.7* 21.3* 15.6* 15.3*  NEUTROABS 13.0*  --   --   --  13.0*  HGB 10.0* 10.0* 10.3* 10.5* 10.9*  HCT 30.0* 30.4* 31.1* 31.2* 32.0*  MCV 92.6 92.7 92.8 92.3 91.2  PLT 216 190 176 182 182    @IMGRELPRIORS @ Medications:    . aspirin EC  325 mg Oral Daily  . ceFEPime (MAXIPIME) IV  2 g Intravenous Once  . darbepoetin (ARANESP) injection - DIALYSIS  100 mcg Intravenous Q Mon-HD  . feeding supplement (NEPRO CARB STEADY)  237 mL Oral BID BM  . guaiFENesin  600 mg Oral BID  . heparin  5,000 Units Subcutaneous Q8H  . insulin aspart  0-15 Units Subcutaneous TID WC  . insulin aspart  0-5 Units Subcutaneous QHS  . labetalol  100 mg Oral BID  . multivitamin  1 tablet Oral QHS  . oseltamivir  30 mg Oral Daily  . pantoprazole  40 mg Oral Daily  . sodium  chloride  3 mL Intravenous Q12H  . vancomycin  750 mg Intravenous Q M,W,F-HD     Assessment/ Plan:   1. Influenza B- afebrile feeling better, on tamiflu 2. ESRD: cont with HD qMWF (off of schedule, next HD will be Sunday 3. Anemia:cont with ESA 4. CKD-MBD:stabl34 5. Nutrition:protein malnutrition- encourage po intake and protein supplementation 6. Hypertension:stable 7. Abnormal CK's- ?demand ischemia, h/o bradycardia, cardiology following.  8. Dispo- per primary svc  Bookert Guzzi A 09/13/2013, 8:23 AM

## 2013-09-13 NOTE — Procedures (Signed)
Patient was seen on dialysis and the procedure was supervised. BFR 400 Via AVF BP is 153/75.  Patient appears to be tolerating treatment well

## 2013-09-13 NOTE — Progress Notes (Signed)
PT Cancellation Note  Patient Details Name: Hilman Kissling MRN: [id number] DOB: January 14, 1926   Cancelled Treatment:     Pt off floor at hemodialysis. Will re-attempt to see pt at next available time.    Donnamarie Poag Morris, Valinda [phone] 09/13/2013, 9:36 AM

## 2013-09-13 NOTE — Progress Notes (Signed)
Patient discharge teaching given, including activity, diet, follow-up appoints, and medications. Patient verbalized understanding of all discharge instructions. IV access was d/c'd. Vitals are stable. Skin is intact except as charted in most recent assessments. Pt to be escorted out by NT, to be driven home by family.  Rankin Coolman, MBA, BS, RN 

## 2013-09-15 LAB — CULTURE, BLOOD (ROUTINE X 2): Culture: NO GROWTH

## 2013-09-16 ENCOUNTER — Emergency Department (HOSPITAL_COMMUNITY)
Admission: EM | Admit: 2013-09-16 | Discharge: 2013-09-16 | Disposition: A | Discharge status: Home / Self Care | Payer: Medicare Other | Attending: Emergency Medicine | Admitting: Emergency Medicine

## 2013-09-16 DIAGNOSIS — E119 Type 2 diabetes mellitus without complications: Secondary | ICD-10-CM | POA: Insufficient documentation

## 2013-09-16 DIAGNOSIS — D649 Anemia, unspecified: Secondary | ICD-10-CM | POA: Insufficient documentation

## 2013-09-16 DIAGNOSIS — Z7982 Long term (current) use of aspirin: Secondary | ICD-10-CM | POA: Insufficient documentation

## 2013-09-16 DIAGNOSIS — Z992 Dependence on renal dialysis: Secondary | ICD-10-CM | POA: Insufficient documentation

## 2013-09-16 DIAGNOSIS — E785 Hyperlipidemia, unspecified: Secondary | ICD-10-CM | POA: Insufficient documentation

## 2013-09-16 DIAGNOSIS — Z79899 Other long term (current) drug therapy: Secondary | ICD-10-CM | POA: Insufficient documentation

## 2013-09-16 DIAGNOSIS — N186 End stage renal disease: Secondary | ICD-10-CM | POA: Insufficient documentation

## 2013-09-16 DIAGNOSIS — Z794 Long term (current) use of insulin: Secondary | ICD-10-CM | POA: Insufficient documentation

## 2013-09-16 DIAGNOSIS — I120 Hypertensive chronic kidney disease with stage 5 chronic kidney disease or end stage renal disease: Secondary | ICD-10-CM | POA: Insufficient documentation

## 2013-09-16 LAB — GLUCOSE, CAPILLARY: Glucose-Capillary: 71 mg/dL (ref 70–99)

## 2013-09-16 NOTE — ED Provider Notes (Addendum)
CSN: [id number]     Arrival date & time 09/16/13  1642 History   First MD Initiated Contact with Patient 09/16/13 1717     Chief Complaint  Patient presents with  . Altered Mental Status   (Consider location/radiation/quality/duration/timing/severity/associated sxs/prior Treatment) HPI History is obtained from patient's daughter, patient and patient's wife. Patient sent here from dialysis. Patient was reportedly "not is jovial self this morning." And did not seem himself per the dialysis nurses. His daughter reports that he had similar episodes approximately once per day. Patient is asymptomatic. He did have his hemodialysis today as regularly scheduled. He denies pain anywhere he did not have slurred speech. There was no focal numbness or weakness. Patient is presently asymptomatic without treatment. Past Medical History  Diagnosis Date  . Hyperlipidemia   . Anemia   . Diabetes mellitus   . Hypertension   . Chronic kidney disease     Dialysis on MONDAY, [facility] and FRIDAYS   Past Surgical History  Procedure Laterality Date  . Laparoscopic gastrotomy w/ repair of ulcer    . Av fistula placement  2008    Left Upper Arm   Family History  Problem Relation Age of Onset  . Cancer Mother   . Cancer Father   . Diabetes Son   . Diabetes Daughter   . Diabetes Daughter   . Diabetes Cousin    History  Substance Use Topics  . Smoking status: Never Smoker   . Smokeless tobacco: Not on file  . Alcohol Use: No    Review of Systems  Constitutional: Negative.   HENT: Negative.   Respiratory: Negative.   Cardiovascular: Negative.   Gastrointestinal: Negative.   Musculoskeletal: Negative.   Skin: Negative.   Neurological: Negative.   Psychiatric/Behavioral: Negative.   All other systems reviewed and are negative.    Allergies  Review of patient's allergies indicates no known allergies.  Home Medications   Current Outpatient Rx  Name  Route  Sig  Dispense  Refill  .  aspirin EC 81 MG tablet   Oral   Take 1 tablet (81 mg total) by mouth daily.   30 tablet   0   . darbepoetin (ARANESP) 100 MCG/0.5ML SOLN injection   Intravenous   Inject 0.5 mLs (100 mcg total) into the vein every Monday with hemodialysis.   4.2 mL      . folic acid-vitamin b complex-vitamin c-selenium-zinc (DIALYVITE) 3 MG TABS   Oral   Take 1 tablet by mouth daily.           . hydrOXYzine (ATARAX/VISTARIL) 10 MG tablet   Oral   Take 1 tablet (10 mg total) by mouth 3 (three) times daily as needed for itching.   30 tablet   0   . Insulin Isophane Human (HUMULIN N PEN [place])   Subcutaneous   Inject 12 Units into the skin 2 (two) times daily.           [name] labetalol (NORMODYNE) 300 MG tablet   Oral   Take 300 mg by mouth 3 (three) times daily.          [name] omeprazole (PRILOSEC) 20 MG capsule   Oral   Take 20 mg by mouth daily.           [name] oseltamivir (TAMIFLU) 30 MG capsule   Oral   Take 1 capsule (30 mg total) by mouth daily.   2 capsule   0   . simvastatin (ZOCOR) 40 MG tablet  Oral   Take 40 mg by mouth at bedtime.            BP 153/51  Pulse 85  Temp(Src) 98.3 F (36.8 C) (Oral)  Resp 21  SpO2 97% Physical Exam  Nursing note and vitals reviewed. Constitutional: He appears well-developed and well-nourished.  HENT:  Head: Normocephalic and atraumatic.  Eyes: Conjunctivae are normal. Pupils are equal, round, and reactive to light.  Neck: Neck supple. No tracheal deviation present. No thyromegaly present.  Cardiovascular: Normal rate and regular rhythm.   No murmur heard. Pulmonary/Chest: Effort normal and breath sounds normal.  Abdominal: Soft. Bowel sounds are normal. He exhibits no distension. There is no tenderness.  Musculoskeletal: Normal range of motion. He exhibits no edema and no tenderness.  Left upper extremity with dialysis fistula with good thrill  Neurological: He is alert. No cranial nerve deficit. Coordination normal.  Gait normal   Skin: Skin is warm and dry. No rash noted.  Psychiatric: He has a normal mood and affect.    ED Course  Procedures (including critical care time) Labs Review Labs Reviewed - No data to display Imaging Review No results found.  EKG Interpretation   None      Date: 09/17/2013  Rate: 80  Rhythm: normal sinus rhythm  QRS Axis: normal  Intervals: normal  ST/T Wave abnormalities: nonspecific T wave changes  Conduction Disutrbances:none  Narrative Interpretation:   Old EKG Reviewed: No significant change from October 32,012 interpreted by me   MDM  No diagnosis found. I don't feel that further workup is needed. Patient has stable vital signs asymptomatic stable and appears normal per family members Plan return if condition worsens. Keep scheduled appointment for next hemodialysis session in 2 days     Doug Sou, MD 09/16/13 [place]pe Spanish, MD 09/17/13 [phone]

## 2013-09-16 NOTE — ED Notes (Addendum)
Pt reports to the ED for eval of AMS. Pt arrives via [facility] from a dialysis center for increased confusion. Pt had full tx. Stroke screen negative. Pt recently dx with the flu and per family he has been more confused x 2-3 days. Pt reports productive cough.12 lead showed sinus rhythm. Pt alert and oriented to person, place, and situation. Disoriented to time. Denies pain. Skin hot and dry. Resp e/u.

## 2013-09-16 NOTE — ED Notes (Signed)
IV team at bedside to de-access graft.

## 2013-09-16 NOTE — ED Notes (Signed)
Upon completion of dialysis, pt had brief episode where he did not know where he was. Pt now returned to baseline per family. Pt denies any pain, SOB, dizziness, fever. States just finished treatment of tamiflu yesterday. Pt with no voiced complaints.  Family states he has been having "moments" of mild confusion lately.  Dialysis needles remain in place to LUE graft. IV team paged

## 2013-09-16 NOTE — ED Notes (Signed)
IV team notified of need to remove needles from graft

## 2013-09-30 ENCOUNTER — Encounter: Payer: Medicare Other | Admitting: Physician Assistant

## 2013-10-03 ENCOUNTER — Ambulatory Visit (INDEPENDENT_AMBULATORY_CARE_PROVIDER_SITE_OTHER): Payer: Medicare Other | Admitting: Physician Assistant

## 2013-10-03 ENCOUNTER — Encounter: Payer: Self-pay | Admitting: Physician Assistant

## 2013-10-03 ENCOUNTER — Telehealth: Payer: Self-pay | Admitting: *Deleted

## 2013-10-03 VITALS — BP 118/64 | HR 82 | Ht 62.0 in | Wt 162.0 lb

## 2013-10-03 DIAGNOSIS — I252 Old myocardial infarction: Secondary | ICD-10-CM

## 2013-10-03 DIAGNOSIS — N186 End stage renal disease: Secondary | ICD-10-CM

## 2013-10-03 DIAGNOSIS — R945 Abnormal results of liver function studies: Secondary | ICD-10-CM

## 2013-10-03 DIAGNOSIS — E785 Hyperlipidemia, unspecified: Secondary | ICD-10-CM

## 2013-10-03 DIAGNOSIS — I10 Essential (primary) hypertension: Secondary | ICD-10-CM

## 2013-10-03 DIAGNOSIS — R7989 Other specified abnormal findings of blood chemistry: Secondary | ICD-10-CM

## 2013-10-03 LAB — HEPATIC FUNCTION PANEL
ALT: 56 U/L — ABNORMAL HIGH (ref 0–53)
AST: 63 U/L — AB (ref 0–37)
Albumin: 2.6 g/dL — ABNORMAL LOW (ref 3.5–5.2)
Alkaline Phosphatase: 484 U/L — ABNORMAL HIGH (ref 39–117)
BILIRUBIN TOTAL: 1.2 mg/dL (ref 0.3–1.2)
Bilirubin, Direct: 0.2 mg/dL (ref 0.0–0.3)
Total Protein: 7.4 g/dL (ref 6.0–8.3)

## 2013-10-03 MED ORDER — ASPIRIN EC 81 MG PO TBEC: 81.0000 mg | DELAYED_RELEASE_TABLET | Freq: Every day | ORAL | Status: DC

## 2013-10-03 NOTE — Patient Instructions (Signed)
LAB WORK TODAY; LFT  Your physician recommends that you schedule a follow-up appointment in: [place] DR. [place]  Your physician recommends that you continue on your current medications as directed. Please refer to the Current Medication list given to you today.

## 2013-10-03 NOTE — Telephone Encounter (Signed)
pt's wife notified about lab results for pt with verbal understanding to Plan of Care

## 2013-10-03 NOTE — Progress Notes (Signed)
[place], Steven [place], [place]  [address] Phone: [phone] Fax:  [phone]  Date:  10/03/2013   ID:  Steven Forbes, DOB 11/12/1925, MRN [id number]  PCP:  Precious Reel, MD  Cardiologist:  Dr. Kirk Ruths     History of Present Illness: Steven Forbes is a 80 y.o. male with a hx of T2DM, HTN, HL, ESRD on hemodialysis.  He was admitted last month with influenza. He was seen by cardiology due to elevated cardiac enzymes as well as evidence of volume excess. He had recently been seen in emergency room with sinus tachycardia. He returned to the hospital with fevers and cough.  The patient was dialyzed for volume control. It was suspected that his elevated cardiac enzymes were likely related to demand ischemia in the setting of acute illness, CHF and ESRD. LFTs were noted to be markedly elevated. Statin therapy was held. Abdominal US was negative for acute findings in the liver. There was mild enlargement of the pancreatic duct (further evaluation with a non-emergent MRCP could be considered).  Echocardiogram (09/10/2013): EF 50-55%, normal wall motion, grade 2 diastolic dysfunction, mild aortic stenosis, trivial AI, MAC, moderate MR, mild LAE, moderate to severe TR, moderately increased PASP.  Given normal LV function on echocardiogram he was set up for inpatient stress testing. Lexiscan Myoview (09/11/2013): Subtle ischemia in the anteroseptal wall, EF 44%. This was not felt to represent a high risk study. Given his medical co-morbidities, medical therapy was favored. Cardiac catheterization could be considered in the future after recovering from influenza if he develops symptoms.  He returns for follow up. He is feeling better. He denies chest pain, significant dyspnea, orthopnea, PND, edema. He denies syncope or near-syncope.  Of note, he has restarted his Lipitor.  Recent Labs: 09/09/2013: Pro B Natriuretic peptide (BNP) >70000.0*  09/13/2013: ALT 156*; Creatinine 4.44*;  Hemoglobin 10.7*; Potassium 3.4*   Wt Readings from Last 3 Encounters:  10/03/13 162 lb (73.483 kg)  09/13/13 154 lb 1.6 oz (69.9 kg)  05/24/11 180 lb (81.647 kg)     Past Medical History  Diagnosis Date  . Hyperlipidemia   . Anemia   . Diabetes mellitus   . Hypertension   . ESRD (end stage renal disease)     Dialysis on MONDAY, WEDNESDAY and FRIDAYS  . Hx of echocardiogram     a.  Echocardiogram (09/10/2013): EF 50-55%, normal wall motion, grade 2 diastolic dysfunction, mild aortic stenosis, trivial AI, MAC, moderate MR, mild LAE, moderate to severe TR, moderately increased PASP.  [name] Hx of non-ST elevation myocardial infarction (NSTEMI)     a. in setting of influenza, volume overload 08/2013 => Lexiscan Myoview (09/11/2013): Subtle ischemia in the anteroseptal wall, EF 44%. => med Rx recommended    Current Outpatient Prescriptions  Medication Sig Dispense Refill  . amLODipine (NORVASC) 5 MG tablet Take 5 mg by mouth daily.       [name] aspirin EC 81 MG tablet Take 1 tablet (81 mg total) by mouth daily.  30 tablet  11  . atorvastatin (LIPITOR) 80 MG tablet Take 80 mg by mouth daily.       . B Complex-C-Folic Acid (DIALYVITE PO) Take by mouth daily.      . darbepoetin (ARANESP) 100 MCG/0.5ML SOLN injection Inject 0.5 mLs (100 mcg total) into the vein every Monday with hemodialysis.  4.2 mL    . folic acid-vitamin b complex-vitamin c-selenium-zinc (DIALYVITE) 3 MG TABS Take 1 tablet by mouth daily.        [name]  Insulin Isophane Human (HUMULIN N PEN [place]) Inject 12 Units into the skin 2 (two) times daily.        [name] labetalol (NORMODYNE) 300 MG tablet Take 300 mg by mouth 3 (three) times daily.       [name] omeprazole (PRILOSEC) 20 MG capsule Take 20 mg by mouth daily.         No current facility-administered medications for this visit.    Allergies:   Review of patient's allergies indicates no known allergies.   Social History:  The patient  reports that he has never smoked. He does not have any  smokeless tobacco history on file. He reports that he does not drink alcohol or use illicit drugs.   Family History:  The patient's family history includes Cancer in his father and mother; Diabetes in his cousin, daughter, daughter, and son.   ROS:  Please see the history of present illness.   He denies any bleeding problems.   All other systems reviewed and negative.   PHYSICAL EXAM: VS:  BP 118/64  Pulse 82  Ht 5\' 2"  (1.575 m)  Wt 162 lb (73.483 kg)  BMI 29.62 kg/m2 Well nourished, well developed, in no acute distress HEENT: normal Neck: + JVD Cardiac:  normal S1, S2; RRR; 2/6 systolic murmur heard best along the LSB Lungs:  Decreased breath sounds bilaterally; faint crackles at the bases; no wheezing Abd: soft, nontender, no hepatomegaly Ext: trace bilateral LE edema Skin: warm and dry Neuro:  CNs 2-12 intact, no focal abnormalities noted  EKG:  NSR, HR 82, LAD, nonspecific ST-T wave changes     ASSESSMENT AND PLAN:  1. Non-STEMI: This was likely related to demand ischemia in the setting of acute CHF and ESRD. Myoview study was low risk. He likely has underlying coronary artery disease. Medical therapy will be continued. He currently denies any symptoms consistent with angina. Continue aspirin, beta blocker, amlodipine. He remains on statin therapy. Invasive workup could be considered in the future should he develop symptoms consistent with angina. 2. Hyperlipidemia: He had significantly elevated LFTs in the hospital. He resumed Lipitor on his own after discharge. I will repeat LFTs today. If these remain elevated, he will be asked to hold his Lipitor until he can followup with primary care. 3. Hypertension: Controlled. 4. ESRD: He remains on hemodialysis Monday, Wednesday, Friday. 5. Influenza: Resolved. 6. Disposition: Follow up with Dr. Stanford Breed in 6 weeks  Signed, Richardson Dopp, PA-C  10/03/2013 12:59 PM

## 2013-11-08 ENCOUNTER — Encounter: Payer: Self-pay | Admitting: Vascular Surgery

## 2013-11-08 ENCOUNTER — Other Ambulatory Visit: Payer: Self-pay | Admitting: Internal Medicine

## 2013-11-08 DIAGNOSIS — R7989 Other specified abnormal findings of blood chemistry: Secondary | ICD-10-CM

## 2013-11-08 DIAGNOSIS — R945 Abnormal results of liver function studies: Secondary | ICD-10-CM

## 2013-11-11 ENCOUNTER — Inpatient Hospital Stay: Admission: RE | Admit: 2013-11-11 | Payer: Medicare Other | Source: Ambulatory Visit

## 2013-11-12 ENCOUNTER — Other Ambulatory Visit: Payer: Medicare Other

## 2013-11-12 ENCOUNTER — Encounter: Payer: Self-pay | Admitting: Vascular Surgery

## 2013-11-13 ENCOUNTER — Ambulatory Visit (INDEPENDENT_AMBULATORY_CARE_PROVIDER_SITE_OTHER): Payer: Medicare Other | Admitting: Vascular Surgery

## 2013-11-13 ENCOUNTER — Encounter: Payer: Self-pay | Admitting: Vascular Surgery

## 2013-11-13 ENCOUNTER — Encounter (HOSPITAL_COMMUNITY): Payer: Self-pay | Admitting: Pharmacy Technician

## 2013-11-13 ENCOUNTER — Other Ambulatory Visit: Payer: Self-pay | Admitting: *Deleted

## 2013-11-13 VITALS — BP 110/73 | HR 81 | Ht 62.0 in | Wt 161.5 lb

## 2013-11-13 DIAGNOSIS — Z992 Dependence on renal dialysis: Secondary | ICD-10-CM

## 2013-11-13 DIAGNOSIS — N186 End stage renal disease: Secondary | ICD-10-CM

## 2013-11-13 NOTE — Assessment & Plan Note (Signed)
I think it would be reasonable to plicate the aneurysms in a staged fashion. I plan on plicating the more peripheral aneurysm first as it is larger. This should provide adequate room to cannulate the fistula above this in 2 sites. Once the new segment can be cannulated we can bring him back for stage repair of the other large aneurysm. We have discussed the procedure potential risks and the office today and he is agreeable to proceed. This has been scheduled for 11/21/2013.

## 2013-11-13 NOTE — Progress Notes (Signed)
 [facility]  Patient name: Steven Forbes MRN: [id number] DOB: 07/31/1926 Sex: male  REASON FOR VISIT: Evaluate for repair of large aneurysms left upper arm AV fistula. Referred by Dr. James Lin.  HPI: Steven Forbes is a 80 y.o. male who had a left brachiocephalic AV fistula placed in 2008. 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 Lin. It was noted there was decreased flow in the fistula because of turbulence in the aneurysms and he was referred for stage repair of his aneurysms.  He denies any recent uremic symptoms. Specifically he denies nausea, vomiting, fatigue, anorexia, or palpitations.   Past Medical History  Diagnosis Date  . Hyperlipidemia   . Anemia   . Diabetes mellitus   . Hypertension   . ESRD (end stage renal disease)     Dialysis on MONDAY, WEDNESDAY and FRIDAYS  . Hx of echocardiogram     a.  Echocardiogram (09/10/2013): EF 50-55%, normal wall motion, grade 2 diastolic dysfunction, mild aortic stenosis, trivial AI, MAC, moderate MR, mild LAE, moderate to severe TR, moderately increased PASP.  . Hx of non-ST elevation myocardial infarction (NSTEMI)     a. in setting of influenza, volume overload 08/2013 => Lexiscan Myoview (09/11/2013): Subtle ischemia in the anteroseptal wall, EF 44%. => med Rx recommended   Family History  Problem Relation Age of Onset  . Cancer Mother   . Cancer Father   . Diabetes Son   . Diabetes Daughter   . Diabetes Daughter   . Diabetes Cousin    SOCIAL HISTORY: History  Substance Use Topics  . Smoking status: Never Smoker   . Smokeless tobacco: Not on file  . Alcohol Use: No   No Known Allergies Current Outpatient Prescriptions  Medication Sig Dispense Refill  . aspirin EC 81 MG tablet Take 1 tablet (81 mg total) by mouth daily.  30 tablet  11  . atorvastatin (LIPITOR) 80 MG tablet Take 80 mg by mouth daily.       . darbepoetin (ARANESP) 100 MCG/0.5ML SOLN  injection Inject 0.5 mLs (100 mcg total) into the vein every Monday with hemodialysis.  4.2 mL    . folic acid-vitamin b complex-vitamin c-selenium-zinc (DIALYVITE) 3 MG TABS Take 1 tablet by mouth daily.        . Insulin Isophane Human (HUMULIN N PEN [place]) Inject 12 Units into the skin 2 (two) times daily.        . labetalol (NORMODYNE) 300 MG tablet Take 300 mg by mouth 3 (three) times daily.       . omeprazole (PRILOSEC) 20 MG capsule Take 20 mg by mouth daily.        . amLODipine (NORVASC) 5 MG tablet Take 5 mg by mouth daily.        No current facility-administered medications for this visit.   REVIEW OF SYSTEMS: [X ] denotes positive finding; [  ] denotes negative finding  CARDIOVASCULAR:  [ ] chest pain   [ ] chest pressure   [ ] palpitations   [ ] orthopnea   [ ] dyspnea on exertion   [ ] claudication   [ ] rest pain   [ ] DVT   [ ] phlebitis PULMONARY:   [ ] productive cough   [ ] asthma   [ ] wheezing NEUROLOGIC:   [ ] weakness  [ ] paresthesias  [ ] aphasia  [ ] amaurosis  [ ] dizziness   HEMATOLOGIC:   [ ]  bleeding problems   [ ]  clotting disorders MUSCULOSKELETAL:  [ ]  joint pain   [ ]  joint swelling [ ]  leg swelling GASTROINTESTINAL: [ ]   blood in stool  [ ]   hematemesis GENITOURINARY:  [ ]   dysuria  [ ]   hematuria PSYCHIATRIC:  [ ]  history of major depression INTEGUMENTARY:  [ ]  rashes  [ ]  ulcers CONSTITUTIONAL:  [ ]  fever   [ ]  chills  PHYSICAL EXAM: Filed Vitals:   11/13/13 0904  BP: 110/73  Pulse: 81  Height: 5\' 2"  (1.575 m)  Weight: 161 lb 8 oz (73.256 kg)  SpO2: 100%   Body mass index is 29.53 kg/(m^2). GENERAL: The patient is a well-nourished male, in no acute distress. The vital signs are documented above. CARDIOVASCULAR: There is a regular rate and rhythm.  PULMONARY: There is good air exchange bilaterally without wheezing or rales. He has a functioning left upper arm fistula which has 2 large aneurysms. The fistula is somewhat pulsatile. There is no cough mild  of the overlying skin. NEUROLOGIC: No focal weakness or paresthesias are detected. SKIN: There are no ulcers or rashes noted. PSYCHIATRIC: The patient has a normal affect.  DATA:  I have reviewed his records from [facility].  MEDICAL ISSUES:  ESRD on dialysis I think it would be reasonable to plicate the aneurysms in a staged fashion. I plan on plicating the more peripheral aneurysm first as it is larger. This should provide adequate room to cannulate the fistula above this in 2 sites. Once the new segment can be cannulated we can bring him back for stage repair of the other large aneurysm. We have discussed the procedure potential risks and the office today and he is agreeable to proceed. This has been scheduled for 11/21/2013.   [place] [facility] Beeper: [phone]

## 2013-11-14 ENCOUNTER — Ambulatory Visit: Payer: Medicare Other | Admitting: Cardiology

## 2013-11-20 ENCOUNTER — Encounter (HOSPITAL_COMMUNITY): Payer: Self-pay | Admitting: *Deleted

## 2013-11-20 MED ORDER — CHLORHEXIDINE GLUCONATE CLOTH 2 % EX PADS: 6.0000 | MEDICATED_PAD | Freq: Once | CUTANEOUS | Status: DC

## 2013-11-20 MED ORDER — SODIUM CHLORIDE 0.9 % IV SOLN: INTRAVENOUS | Status: DC

## 2013-11-20 MED ORDER — DEXTROSE 5 % IV SOLN
1.5000 g | INTRAVENOUS | Status: AC
  Administered 2013-11-21: 1.5 g via INTRAVENOUS
  Filled 2013-11-20: qty 1.5

## 2013-11-20 MED ORDER — CHLORHEXIDINE GLUCONATE CLOTH 2 % EX PADS
6.0000 | MEDICATED_PAD | Freq: Once | CUTANEOUS | Status: DC
Start: 2013-11-20 — End: 2013-11-21

## 2013-11-21 ENCOUNTER — Encounter (HOSPITAL_COMMUNITY): Payer: Self-pay | Admitting: Certified Registered"

## 2013-11-21 ENCOUNTER — Encounter (HOSPITAL_COMMUNITY): Payer: Medicare Other | Admitting: Vascular Surgery

## 2013-11-21 ENCOUNTER — Ambulatory Visit (HOSPITAL_COMMUNITY): Payer: Medicare Other

## 2013-11-21 ENCOUNTER — Ambulatory Visit (HOSPITAL_COMMUNITY)
Admission: RE | Admit: 2013-11-21 | Discharge: 2013-11-21 | Disposition: A | Discharge status: Home / Self Care | Payer: Medicare Other | Source: Ambulatory Visit | Attending: Vascular Surgery | Admitting: Vascular Surgery

## 2013-11-21 ENCOUNTER — Ambulatory Visit (HOSPITAL_COMMUNITY): Payer: Medicare Other | Admitting: Vascular Surgery

## 2013-11-21 ENCOUNTER — Encounter (HOSPITAL_COMMUNITY)
Admission: RE | Disposition: A | Discharge status: Home / Self Care | Payer: Self-pay | Source: Ambulatory Visit | Attending: Vascular Surgery

## 2013-11-21 ENCOUNTER — Telehealth: Payer: Self-pay | Admitting: Vascular Surgery

## 2013-11-21 DIAGNOSIS — Z79899 Other long term (current) drug therapy: Secondary | ICD-10-CM | POA: Insufficient documentation

## 2013-11-21 DIAGNOSIS — N186 End stage renal disease: Secondary | ICD-10-CM | POA: Insufficient documentation

## 2013-11-21 DIAGNOSIS — I770 Arteriovenous fistula, acquired: Secondary | ICD-10-CM | POA: Insufficient documentation

## 2013-11-21 DIAGNOSIS — D649 Anemia, unspecified: Secondary | ICD-10-CM | POA: Insufficient documentation

## 2013-11-21 DIAGNOSIS — Y831 Surgical operation with implant of artificial internal device as the cause of abnormal reaction of the patient, or of later complication, without mention of misadventure at the time of the procedure: Secondary | ICD-10-CM | POA: Insufficient documentation

## 2013-11-21 DIAGNOSIS — I120 Hypertensive chronic kidney disease with stage 5 chronic kidney disease or end stage renal disease: Secondary | ICD-10-CM | POA: Insufficient documentation

## 2013-11-21 DIAGNOSIS — Z992 Dependence on renal dialysis: Secondary | ICD-10-CM | POA: Insufficient documentation

## 2013-11-21 DIAGNOSIS — E785 Hyperlipidemia, unspecified: Secondary | ICD-10-CM | POA: Insufficient documentation

## 2013-11-21 DIAGNOSIS — I252 Old myocardial infarction: Secondary | ICD-10-CM | POA: Insufficient documentation

## 2013-11-21 DIAGNOSIS — Z7982 Long term (current) use of aspirin: Secondary | ICD-10-CM | POA: Insufficient documentation

## 2013-11-21 DIAGNOSIS — T82898A Other specified complication of vascular prosthetic devices, implants and grafts, initial encounter: Secondary | ICD-10-CM | POA: Insufficient documentation

## 2013-11-21 DIAGNOSIS — E119 Type 2 diabetes mellitus without complications: Secondary | ICD-10-CM | POA: Insufficient documentation

## 2013-11-21 DIAGNOSIS — I509 Heart failure, unspecified: Secondary | ICD-10-CM | POA: Insufficient documentation

## 2013-11-21 DIAGNOSIS — Z794 Long term (current) use of insulin: Secondary | ICD-10-CM | POA: Insufficient documentation

## 2013-11-21 HISTORY — DX: Shortness of breath: R06.02

## 2013-11-21 HISTORY — DX: Unspecified hearing loss, unspecified ear: H91.90

## 2013-11-21 HISTORY — DX: Unspecified asthma, uncomplicated: J45.909

## 2013-11-21 HISTORY — PX: REVISON OF ARTERIOVENOUS FISTULA: SHX6074

## 2013-11-21 HISTORY — DX: Influenza due to unidentified influenza virus with other respiratory manifestations: J11.1

## 2013-11-21 HISTORY — DX: Personal history of other medical treatment: Z92.89

## 2013-11-21 LAB — GLUCOSE, CAPILLARY: Glucose-Capillary: 100 mg/dL — ABNORMAL HIGH (ref 70–99)

## 2013-11-21 LAB — POCT I-STAT 4, (NA,K, GLUC, HGB,HCT)
Glucose, Bld: 141 mg/dL — ABNORMAL HIGH (ref 70–99)
HCT: 40 % (ref 39.0–52.0)
Hemoglobin: 13.6 g/dL (ref 13.0–17.0)
POTASSIUM: 2.8 meq/L — AB (ref 3.7–5.3)
SODIUM: 140 meq/L (ref 137–147)

## 2013-11-21 SURGERY — REVISON OF ARTERIOVENOUS FISTULA
Anesthesia: Monitor Anesthesia Care | Site: Arm Upper | Laterality: Left

## 2013-11-21 MED ORDER — FENTANYL CITRATE 0.05 MG/ML IJ SOLN: 25.0000 ug | INTRAMUSCULAR | Status: DC | PRN

## 2013-11-21 MED ORDER — LIDOCAINE HCL (PF) 1 % IJ SOLN
INTRAMUSCULAR | Status: AC
  Filled 2013-11-21: qty 30

## 2013-11-21 MED ORDER — FENTANYL CITRATE 0.05 MG/ML IJ SOLN
INTRAMUSCULAR | Status: DC | PRN
  Administered 2013-11-21 (×3): 50 ug via INTRAVENOUS

## 2013-11-21 MED ORDER — ACETAMINOPHEN 325 MG PO TABS: 325.0000 mg | ORAL_TABLET | ORAL | Status: DC | PRN

## 2013-11-21 MED ORDER — PROTAMINE SULFATE 10 MG/ML IV SOLN
INTRAVENOUS | Status: AC
  Filled 2013-11-21: qty 5

## 2013-11-21 MED ORDER — ONDANSETRON HCL 4 MG/2ML IJ SOLN: 4.0000 mg | Freq: Once | INTRAMUSCULAR | Status: DC | PRN

## 2013-11-21 MED ORDER — PROTAMINE SULFATE 10 MG/ML IV SOLN
INTRAVENOUS | Status: DC | PRN
  Administered 2013-11-21 (×2): 10 mg via INTRAVENOUS
  Administered 2013-11-21: 20 mg via INTRAVENOUS

## 2013-11-21 MED ORDER — ACETAMINOPHEN 160 MG/5ML PO SOLN
325.0000 mg | ORAL | Status: DC | PRN
  Filled 2013-11-21: qty 20.3

## 2013-11-21 MED ORDER — PROPOFOL INFUSION 10 MG/ML OPTIME
INTRAVENOUS | Status: DC | PRN
  Administered 2013-11-21: 50 ug/kg/min via INTRAVENOUS

## 2013-11-21 MED ORDER — LIDOCAINE HCL (CARDIAC) 20 MG/ML IV SOLN
INTRAVENOUS | Status: AC
  Filled 2013-11-21: qty 5

## 2013-11-21 MED ORDER — LIDOCAINE-EPINEPHRINE (PF) 1 %-1:200000 IJ SOLN
INTRAMUSCULAR | Status: DC | PRN
  Administered 2013-11-21: 30 mL

## 2013-11-21 MED ORDER — THROMBIN 20000 UNITS EX SOLR
CUTANEOUS | Status: AC
  Filled 2013-11-21: qty 20000

## 2013-11-21 MED ORDER — PROPOFOL 10 MG/ML IV BOLUS
INTRAVENOUS | Status: AC
  Filled 2013-11-21: qty 20

## 2013-11-21 MED ORDER — OXYCODONE HCL 5 MG PO TABS: 5.0000 mg | ORAL_TABLET | Freq: Four times a day (QID) | ORAL | Status: DC | PRN

## 2013-11-21 MED ORDER — HEPARIN SODIUM (PORCINE) 1000 UNIT/ML IJ SOLN
INTRAMUSCULAR | Status: DC | PRN
  Administered 2013-11-21: 6000 [IU] via INTRAVENOUS

## 2013-11-21 MED ORDER — HEPARIN SODIUM (PORCINE) 5000 UNIT/ML IJ SOLN
INTRAMUSCULAR | Status: DC | PRN
  Administered 2013-11-21: 12:00:00

## 2013-11-21 MED ORDER — FENTANYL CITRATE 0.05 MG/ML IJ SOLN
INTRAMUSCULAR | Status: AC
  Filled 2013-11-21: qty 5

## 2013-11-21 MED ORDER — SODIUM CHLORIDE 0.9 % IR SOLN
Status: DC | PRN
  Administered 2013-11-21: 1000 mL

## 2013-11-21 MED ORDER — SODIUM CHLORIDE 0.9 % IV SOLN
INTRAVENOUS | Status: DC
  Administered 2013-11-21: 10:00:00 via INTRAVENOUS

## 2013-11-21 MED ORDER — LIDOCAINE HCL (PF) 1 % IJ SOLN
INTRAMUSCULAR | Status: DC | PRN
  Administered 2013-11-21: 30 mL

## 2013-11-21 SURGICAL SUPPLY — 33 items
ADH SKN CLS APL DERMABOND .7 (GAUZE/BANDAGES/DRESSINGS) ×1
ADH SKN CLS LQ APL DERMABOND (GAUZE/BANDAGES/DRESSINGS) ×1
CANISTER SUCTION 2500CC (MISCELLANEOUS) ×3 IMPLANT
CLIP TI MEDIUM 6 (CLIP) ×3 IMPLANT
CLIP TI WIDE RED SMALL 6 (CLIP) ×3 IMPLANT
COVER PROBE W GEL 5X96 (DRAPES) ×3 IMPLANT
COVER SURGICAL LIGHT HANDLE (MISCELLANEOUS) ×3 IMPLANT
DERMABOND ADHESIVE PROPEN (GAUZE/BANDAGES/DRESSINGS) ×2
DERMABOND ADVANCED (GAUZE/BANDAGES/DRESSINGS) ×2
DERMABOND ADVANCED .7 DNX12 (GAUZE/BANDAGES/DRESSINGS) ×1 IMPLANT
DERMABOND ADVANCED .7 DNX6 (GAUZE/BANDAGES/DRESSINGS) IMPLANT
ELECT REM PT RETURN 9FT ADLT (ELECTROSURGICAL) ×3
ELECTRODE REM PT RTRN 9FT ADLT (ELECTROSURGICAL) ×1 IMPLANT
GLOVE BIO SURGEON STRL SZ7.5 (GLOVE) ×3 IMPLANT
GLOVE BIOGEL PI IND STRL 8 (GLOVE) ×1 IMPLANT
GLOVE BIOGEL PI INDICATOR 8 (GLOVE) ×2
GOWN STRL REUS W/ TWL LRG LVL3 (GOWN DISPOSABLE) ×3 IMPLANT
GOWN STRL REUS W/TWL LRG LVL3 (GOWN DISPOSABLE) ×9
KIT BASIN OR (CUSTOM PROCEDURE TRAY) ×3 IMPLANT
KIT ROOM TURNOVER OR (KITS) ×3 IMPLANT
NS IRRIG 1000ML POUR BTL (IV SOLUTION) ×3 IMPLANT
PACK CV ACCESS (CUSTOM PROCEDURE TRAY) ×3 IMPLANT
PAD ARMBOARD 7.5X6 YLW CONV (MISCELLANEOUS) ×6 IMPLANT
SPONGE SURGIFOAM ABS GEL 100 (HEMOSTASIS) IMPLANT
SUT PROLENE 5 0 C 1 24 (SUTURE) ×2 IMPLANT
SUT PROLENE 6 0 BV (SUTURE) ×3 IMPLANT
SUT VIC AB 3-0 SH 27 (SUTURE) ×3
SUT VIC AB 3-0 SH 27X BRD (SUTURE) ×1 IMPLANT
SUT VICRYL 4-0 PS2 18IN ABS (SUTURE) ×3 IMPLANT
TOWEL OR 17X24 6PK STRL BLUE (TOWEL DISPOSABLE) ×3 IMPLANT
TOWEL OR 17X26 10 PK STRL BLUE (TOWEL DISPOSABLE) ×3 IMPLANT
UNDERPAD 30X30 INCONTINENT (UNDERPADS AND DIAPERS) ×3 IMPLANT
WATER STERILE IRR 1000ML POUR (IV SOLUTION) ×3 IMPLANT

## 2013-11-21 NOTE — Discharge Instructions (Signed)
° ° °  11/21/2013 Steven Forbes [id number] [birth year]  Surgeon(s): Angelia Mould, MD  Procedure(s): REVISION OF ARTERIOVENOUS FISTULA     x May stick graft on designated area only:  DO NOT [place] 4 WEEKS.  MAY STICK PROXIMAL TO INCISION.     What to eat:  For your first meals, you should eat lightly; only small meals initially.  If you do not have nausea, you may eat larger meals.  Avoid spicy, greasy and heavy food.    General Anesthesia, Adult, Care After  Refer to this sheet in the next few weeks. These instructions provide you with information on caring for yourself after your procedure. Your health care provider may also give you more specific instructions. Your treatment has been planned according to current medical practices, but problems sometimes occur. Call your health care provider if you have any problems or questions after your procedure.  WHAT TO EXPECT AFTER THE PROCEDURE  After the procedure, it is typical to experience:  Sleepiness.  Nausea and vomiting. HOME CARE INSTRUCTIONS  For the first 24 hours after general anesthesia:  Have a responsible person with you.  Do not drive a car. If you are alone, do not take public transportation.  Do not drink alcohol.  Do not take medicine that has not been prescribed by your health care provider.  Do not sign important papers or make important decisions.  You may resume a normal diet and activities as directed by your health care provider.  Change bandages (dressings) as directed.  If you have questions or problems that seem related to general anesthesia, call the hospital and ask for the anesthetist or anesthesiologist on call. SEEK MEDICAL CARE IF:  You have nausea and vomiting that continue the day after anesthesia.  You develop a rash. SEEK IMMEDIATE MEDICAL CARE IF:  You have difficulty breathing.  You have chest pain.  You have any allergic problems. Document Released: 12/12/2000 Document  Revised: 05/08/2013 Document Reviewed: 03/21/2013  [facility] Patient Information 2014 [place], [place].

## 2013-11-21 NOTE — Preoperative (Signed)
Beta Blockers   Reason not to administer Beta Blockers:Not Applicable 

## 2013-11-21 NOTE — Anesthesia Postprocedure Evaluation (Signed)
  Anesthesia Post-op Note  Patient: Steven Forbes  Procedure(s) Performed: Procedure(s): REVISION OF ARTERIOVENOUS FISTULA (Left)  Patient Location: PACU  Anesthesia Type:MAC  Level of Consciousness: awake, alert  and oriented  Airway and Oxygen Therapy: Patient Spontanous Breathing  Post-op Pain: mild  Post-op Assessment: Post-op Vital signs reviewed, Patient's Cardiovascular Status Stable, Respiratory Function Stable, Patent Airway, No signs of Nausea or vomiting and Pain level controlled  Post-op Vital Signs: Reviewed and stable  Complications: No apparent anesthesia complications

## 2013-11-21 NOTE — Anesthesia Procedure Notes (Signed)
Procedure Name: MAC Date/Time: 11/21/2013 11:49 AM Performed by: Julian Reil Pre-anesthesia Checklist: Patient identified, Emergency Drugs available, Suction available and Patient being monitored Patient Re-evaluated:Patient Re-evaluated prior to inductionOxygen Delivery Method: Simple face mask Intubation Type: IV induction Placement Confirmation: positive ETCO2 and breath sounds checked- equal and bilateral

## 2013-11-21 NOTE — Progress Notes (Signed)
Informed that K+ was 2.8 per lab at 0915.  Dr Ermalene Postin notified at that time and stated would be OK if there are no changes with his heart rhythm.  None noted.  Pt remained on monitor throughout time in short stay.

## 2013-11-21 NOTE — Anesthesia Preprocedure Evaluation (Addendum)
Anesthesia Evaluation  Patient identified by MRN, date of birth, ID band Patient awake    Reviewed: Allergy & Precautions, H&P , NPO status , Patient's Chart, lab work & pertinent test results, reviewed documented beta blocker date and time   Airway Mallampati: I TM Distance: >3 FB Neck ROM: Full    Dental  (+) Poor Dentition,    Pulmonary shortness of breath, asthma ,  breath sounds clear to auscultation        Cardiovascular hypertension, Pt. on medications and Pt. on home beta blockers + Past MI, + Peripheral Vascular Disease and +CHF Rhythm:Regular     Neuro/Psych    GI/Hepatic   Endo/Other  diabetes, Type 2  Renal/GU Dialysis and ESRFRenal disease     Musculoskeletal   Abdominal   Peds  Hematology  (+) anemia ,   Anesthesia Other Findings   Reproductive/Obstetrics                          Anesthesia Physical Anesthesia Plan  ASA: III  Anesthesia Plan: MAC   Post-op Pain Management:    Induction: Intravenous  Airway Management Planned: Simple Face Mask  Additional Equipment:   Intra-op Plan:   Post-operative Plan:   Informed Consent: I have reviewed the patients History and Physical, chart, labs and discussed the procedure including the risks, benefits and alternatives for the proposed anesthesia with the patient or authorized representative who has indicated his/her understanding and acceptance.     Plan Discussed with: Surgeon and CRNA  Anesthesia Plan Comments:        Anesthesia Quick Evaluation

## 2013-11-21 NOTE — H&P (View-Only) (Signed)
Vascular and Vein Specialist of [facility]  Patient name: Steven Forbes MRN: [id number] DOB: 1926/04/21 Sex: male  REASON FOR VISIT: Evaluate for repair of large aneurysms left upper arm AV fistula. Referred by Dr. Otelia Santee.  HPI: Steven Forbes is a 80 y.o. male who had a left brachiocephalic AV fistula placed in 2008. 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 Augustin Coupe. It was noted there was decreased flow in the fistula because of turbulence in the aneurysms and he was referred for stage repair of his aneurysms.  He denies any recent uremic symptoms. Specifically he denies nausea, vomiting, fatigue, anorexia, or palpitations.   Past Medical History  Diagnosis Date  . Hyperlipidemia   . Anemia   . Diabetes mellitus   . Hypertension   . ESRD (end stage renal disease)     Dialysis on MONDAY, WEDNESDAY and FRIDAYS  . Hx of echocardiogram     a.  Echocardiogram (09/10/2013): EF 50-55%, normal wall motion, grade 2 diastolic dysfunction, mild aortic stenosis, trivial AI, MAC, moderate MR, mild LAE, moderate to severe TR, moderately increased PASP.  [name] Hx of non-ST elevation myocardial infarction (NSTEMI)     a. in setting of influenza, volume overload 08/2013 => Lexiscan Myoview (09/11/2013): Subtle ischemia in the anteroseptal wall, EF 44%. => med Rx recommended   Family History  Problem Relation Age of Onset  . Cancer Mother   . Cancer Father   . Diabetes Son   . Diabetes Daughter   . Diabetes Daughter   . Diabetes Cousin    SOCIAL HISTORY: History  Substance Use Topics  . Smoking status: Never Smoker   . Smokeless tobacco: Not on file  . Alcohol Use: No   No Known Allergies Current Outpatient Prescriptions  Medication Sig Dispense Refill  . aspirin EC 81 MG tablet Take 1 tablet (81 mg total) by mouth daily.  30 tablet  11  . atorvastatin (LIPITOR) 80 MG tablet Take 80 mg by mouth daily.       . darbepoetin (ARANESP) 100 MCG/0.5ML SOLN  injection Inject 0.5 mLs (100 mcg total) into the vein every Monday with hemodialysis.  4.2 mL    . folic acid-vitamin b complex-vitamin c-selenium-zinc (DIALYVITE) 3 MG TABS Take 1 tablet by mouth daily.        . Insulin Isophane Human (HUMULIN N PEN [place]) Inject 12 Units into the skin 2 (two) times daily.        [name] labetalol (NORMODYNE) 300 MG tablet Take 300 mg by mouth 3 (three) times daily.       [name] omeprazole (PRILOSEC) 20 MG capsule Take 20 mg by mouth daily.        [name] amLODipine (NORVASC) 5 MG tablet Take 5 mg by mouth daily.        No current facility-administered medications for this visit.   REVIEW OF SYSTEMS: Valu.Nieves ] denotes positive finding; [  ] denotes negative finding  CARDIOVASCULAR:  [ ]  chest pain   [ ]  chest pressure   [ ]  palpitations   [ ]  orthopnea   [ ]  dyspnea on exertion   [ ]  claudication   [ ]  rest pain   [ ]  DVT   [ ]  phlebitis PULMONARY:   [ ]  productive cough   [ ]  asthma   [ ]  wheezing NEUROLOGIC:   [ ]  weakness  [ ]  paresthesias  [ ]  aphasia  [ ]  amaurosis  [ ]  dizziness  HEMATOLOGIC:   [ ]  bleeding problems   [ ]  clotting disorders MUSCULOSKELETAL:  [ ]  joint pain   [ ]  joint swelling [ ]  leg swelling GASTROINTESTINAL: [ ]   blood in stool  [ ]   hematemesis GENITOURINARY:  [ ]   dysuria  [ ]   hematuria PSYCHIATRIC:  [ ]  history of major depression INTEGUMENTARY:  [ ]  rashes  [ ]  ulcers CONSTITUTIONAL:  [ ]  fever   [ ]  chills  PHYSICAL EXAM: Filed Vitals:   11/13/13 0904  BP: 110/73  Pulse: 81  Height: 5\' 2"  (1.575 m)  Weight: 161 lb 8 oz (73.256 kg)  SpO2: 100%   Body mass index is 29.53 kg/(m^2). GENERAL: The patient is a well-nourished male, in no acute distress. The vital signs are documented above. CARDIOVASCULAR: There is a regular rate and rhythm.  PULMONARY: There is good air exchange bilaterally without wheezing or rales. He has a functioning left upper arm fistula which has 2 large aneurysms. The fistula is somewhat pulsatile. There is no cough mild  of the overlying skin. NEUROLOGIC: No focal weakness or paresthesias are detected. SKIN: There are no ulcers or rashes noted. PSYCHIATRIC: The patient has a normal affect.  DATA:  I have reviewed his records from [facility].  MEDICAL ISSUES:  ESRD on dialysis I think it would be reasonable to plicate the aneurysms in a staged fashion. I plan on plicating the more peripheral aneurysm first as it is larger. This should provide adequate room to cannulate the fistula above this in 2 sites. Once the new segment can be cannulated we can bring him back for stage repair of the other large aneurysm. We have discussed the procedure potential risks and the office today and he is agreeable to proceed. This has been scheduled for 11/21/2013.   [place] [facility] of [place] Beeper: [phone]

## 2013-11-21 NOTE — Telephone Encounter (Addendum)
Message copied by Doristine Section on Thu Nov 21, 2013  4:45 PM ------      Message from: Peter Minium K      Created: Thu Nov 21, 2013  1:19 PM      Regarding: schedule                   ----- Message -----         From: Gabriel Earing, PA-C         Sent: 11/21/2013   1:17 PM           To: Vvs Charge Pool            S/p plication of left arm fistula  11/21/13.  F/u with Dr. Scot Dock in 3 weeks.        Thanks,      Aldona Bar  notified patient's wife of post op visit on 12-11-13 9:30 am with dr. Scot Dock ------

## 2013-11-21 NOTE — Op Note (Signed)
    NAME: Linn Goetze    MRN: [id number] DOB: Mar 19, 1926    DATE OF OPERATION: 11/21/2013  PREOP DIAGNOSIS: Aneurysm of left brachiocephalic AV fistula  POSTOP DIAGNOSIS: same  PROCEDURE: plication of large aneurysm of left brachiocephalic AV fistula  SURGEON: Judeth Cornfield. Scot Dock, MD, FACS  ASSIST: Leontine Locket, PA  ANESTHESIA: local with sedation   EBL: minimal  INDICATIONS: Steven Forbes is a 80 y.o. male who had 2 large aneurysms of his left upper arm fistula. We elected to address the largest one first with plans to repair the second one in a staged fashion in the future.   FINDINGS: Patent left upper arm AV fistula  TECHNIQUE: The patient was taken to the operating room and sedated by anesthesia. The left upper extremity was prepped and draped in usual sterile fashion. An elliptical incision was made encompassing the more peripheral aneurysm. This was done after the skin was anesthetized. The aneurysm was completely dissected free in the normal fistula proximal and distal to the aneurysm was dissected free circumferentially and controlled. The patient was heparinized. The fistula was then clamped proximally and distally. I then excised a large segment of the aneurysm leaving enough of the artery intact to complete a primary closure such that the closure would be on the lateral posterior aspect of the vein and therefore the vein could be cannulated anteriorly. This anastomosis was done with 2 running 5-0 Prolene sutures. At the completion the clamps were released and there was excellent thrill in the fistula. Hemostasis was obtained in the wound and the heparin was partially reversed with protamine. The wound was closed with a deep layer of 3-0 Vicryl and the skin closed with 4-0 Vicryl. Dermabond was applied. The patient tolerated the procedure well and was transferred to the recovery room in stable condition. All needle and sponge counts were correct.  Deitra Mayo, MD,  FACS Vascular and Vein Specialists of [facility]  DATE OF DICTATION:   11/21/2013

## 2013-11-21 NOTE — Interval H&P Note (Signed)
History and Physical Interval Note:  11/21/2013 11:36 AM  Steven Forbes  has presented today for surgery, with the diagnosis of AVF aneurysms  The various methods of treatment have been discussed with the patient and family. After consideration of risks, benefits and other options for treatment, the patient has consented to  Procedure(s): REVISION OF ARTERIOVENOUS FISTULA (Left) as a surgical intervention .  The patient's history has been reviewed, patient examined, no change in status, stable for surgery.  I have reviewed the patient's chart and labs.  Questions were answered to the patient's satisfaction.     DICKSON,CHRISTOPHER S

## 2013-11-21 NOTE — Transfer of Care (Signed)
Immediate Anesthesia Transfer of Care Note  Patient: Steven Forbes  Procedure(s) Performed: Procedure(s): REVISION OF ARTERIOVENOUS FISTULA (Left)  Patient Location: PACU  Anesthesia Type:MAC  Level of Consciousness: awake, alert , oriented and patient cooperative  Airway & Oxygen Therapy: Patient Spontanous Breathing and Patient connected to nasal cannula oxygen  Post-op Assessment: Report given to PACU RN, Post -op Vital signs reviewed and stable and Patient moving all extremities  Post vital signs: Reviewed and stable  Complications: No apparent anesthesia complications

## 2013-11-22 ENCOUNTER — Encounter (HOSPITAL_COMMUNITY): Payer: Self-pay | Admitting: Vascular Surgery

## 2013-12-10 ENCOUNTER — Encounter: Payer: Self-pay | Admitting: Vascular Surgery

## 2013-12-11 ENCOUNTER — Encounter: Payer: Self-pay | Admitting: Vascular Surgery

## 2013-12-11 ENCOUNTER — Ambulatory Visit (INDEPENDENT_AMBULATORY_CARE_PROVIDER_SITE_OTHER): Payer: Self-pay | Admitting: Vascular Surgery

## 2013-12-11 VITALS — BP 156/75 | HR 82 | Ht 64.0 in | Wt 165.0 lb

## 2013-12-11 DIAGNOSIS — N186 End stage renal disease: Secondary | ICD-10-CM

## 2013-12-11 NOTE — Progress Notes (Signed)
   Patient name: Steven Forbes MRN: [id number] DOB: 17-Nov-1925 Sex: male  REASON FOR VISIT: Follow up after plication of large aneurysm of left brachiocephalic AV fistula.  HPI: Steven Forbes is a 80 y.o. male the 2 large aneurysms in his left brachiocephalic AV fistula. We elected to address the larger one. This was plicated on 76/28/3151. He comes in for a three-week follow up visit. He has no specific complaints. His fistula has been working well.   REVIEW OF SYSTEMS: Valu.Nieves ] denotes positive finding; [  ] denotes negative finding  CARDIOVASCULAR:  [ ]  chest pain   [ ]  dyspnea on exertion    CONSTITUTIONAL:  [ ]  fever   [ ]  chills  PHYSICAL EXAM: Filed Vitals:   12/11/13 0857  BP: 156/75  Pulse: 82  Height: 5\' 4"  (1.626 m)  Weight: 165 lb (74.844 kg)  SpO2: 98%   Body mass index is 28.31 kg/(m^2). GENERAL: The patient is a well-nourished male, in no acute distress. The vital signs are documented above. CARDIOVASCULAR: There is a regular rate and rhythm. PULMONARY: There is good air exchange bilaterally without wheezing or rales. His incision is healing nicely to  MEDICAL ISSUES: Patient is doing well status post plication of his large aneurysm of his left brachiocephalic AV fistula. I think he can begin using the segment that was plicated in approximately 2-3 weeks. If the aneurysm is higher up in the arm in largest this could also be addressed in the future once the lower part of the fistula has been used without difficulty.  [place] [facility] and Vein Specialists of [place] Beeper: [phone]

## 2014-01-07 ENCOUNTER — Ambulatory Visit (INDEPENDENT_AMBULATORY_CARE_PROVIDER_SITE_OTHER): Payer: Medicare Other | Admitting: Cardiology

## 2014-01-07 ENCOUNTER — Encounter: Payer: Self-pay | Admitting: Cardiology

## 2014-01-07 VITALS — BP 142/58 | HR 80 | Ht 64.0 in | Wt 159.0 lb

## 2014-01-07 DIAGNOSIS — E785 Hyperlipidemia, unspecified: Secondary | ICD-10-CM

## 2014-01-07 DIAGNOSIS — Z992 Dependence on renal dialysis: Secondary | ICD-10-CM

## 2014-01-07 DIAGNOSIS — N186 End stage renal disease: Secondary | ICD-10-CM

## 2014-01-07 DIAGNOSIS — I2510 Atherosclerotic heart disease of native coronary artery without angina pectoris: Secondary | ICD-10-CM

## 2014-01-07 DIAGNOSIS — I10 Essential (primary) hypertension: Secondary | ICD-10-CM

## 2014-01-07 NOTE — Assessment & Plan Note (Signed)
Patient has not had chest pain. Nuclear study mildly abnormal but not high risk. Plan conservative measures if possible. Continue aspirin and statin.  

## 2014-01-07 NOTE — Progress Notes (Signed)
HPI: FU CAD; hx of T2DM, HTN, HL, ESRD on hemodialysis. Previous elevation of cardiac enzymes in setting of influenza. Echocardiogram (09/10/2013): EF 50-55%, normal wall motion, grade 2 diastolic dysfunction, mild aortic stenosis, trivial AI, MAC, moderate MR, mild LAE, moderate to severe TR, moderately increased PASP. Given normal LV function on echocardiogram he was set up for inpatient stress testing. Lexiscan Myoview (09/11/2013): Subtle ischemia in the anteroseptal wall, EF 44%. This was not felt to represent a high risk study. Given his medical co-morbidities, medical therapy was favored. Cardiac catheterization could be considered in the future after recovering from influenza if he develops symptoms.  Last seen in Jan 2015. Since then, He denies dyspnea, chest pain, pedal edema or syncope.   Current Outpatient Prescriptions  Medication Sig Dispense Refill  . amLODipine (NORVASC) 5 MG tablet Take 5 mg by mouth daily.       [name] aspirin EC 81 MG tablet Take 1 tablet (81 mg total) by mouth daily.  30 tablet  11  . atorvastatin (LIPITOR) 80 MG tablet Take 80 mg by mouth daily.       . Calcium Carbonate Antacid (TUMS PO) Take 3 tablets by mouth 3 (three) times daily.      . darbepoetin (ARANESP) 100 MCG/0.5ML SOLN injection Inject 0.5 mLs (100 mcg total) into the vein every Monday with hemodialysis.  4.2 mL    . diphenhydrAMINE (BENADRYL) 25 MG tablet Take 25 mg by mouth daily as needed for allergies.      . folic acid-vitamin b complex-vitamin c-selenium-zinc (DIALYVITE) 3 MG TABS Take 1 tablet by mouth daily.        . GuaiFENesin (MUCINEX PO) Take 10 mLs by mouth daily as needed (for allergies).      . Insulin Isophane Human (HUMULIN N PEN [place]) Inject 5-10 Units into the skin 2 (two) times daily.       [name] labetalol (NORMODYNE) 300 MG tablet Take 300 mg by mouth 3 (three) times daily.       [name] omeprazole (PRILOSEC) 20 MG capsule Take 20 mg by mouth daily.        [name] oxyCODONE (ROXICODONE) 5 MG  immediate release tablet Take 1 tablet (5 mg total) by mouth every 6 (six) hours as needed for severe pain.  30 tablet  0   No current facility-administered medications for this visit.     Past Medical History  Diagnosis Date  . Hyperlipidemia   . Anemia   . Diabetes mellitus   . Hypertension   . ESRD (end stage renal disease)     Dialysis on MONDAY, WEDNESDAY and FRIDAYS  . Hx of echocardiogram     a.  Echocardiogram (09/10/2013): EF 50-55%, normal wall motion, grade 2 diastolic dysfunction, mild aortic stenosis, trivial AI, MAC, moderate MR, mild LAE, moderate to severe TR, moderately increased PASP.  [name] Hx of non-ST elevation myocardial infarction (NSTEMI)     a. in setting of influenza, volume overload 08/2013 => Lexiscan Myoview (09/11/2013): Subtle ischemia in the anteroseptal wall, EF 44%. => med Rx recommended  . Asthma   . Shortness of breath     with exertion  . Flu 08/2014  . History of blood transfusion   . HOH (hard of hearing)     Past Surgical History  Procedure Laterality Date  . Laparoscopic gastrotomy w/ repair of ulcer    . Av fistula placement  2008    Left Upper Arm  . Toe amputation Right  all toes  . Revison of arteriovenous fistula Left 11/21/2013    Procedure: REVISION OF ARTERIOVENOUS FISTULA;  Surgeon: Angelia Mould, MD;  Location: [facility];  Service: Vascular;  Laterality: Left;    History   Social History  . Marital Status: Married    Spouse Name: N/A    Number of Children: N/A  . Years of Education: N/A   Occupational History  . Not on file.   Social History Main Topics  . Smoking status: Never Smoker   . Smokeless tobacco: Not on file  . Alcohol Use: No  . Drug Use: No  . Sexual Activity: Not on file   Other Topics Concern  . Not on file   Social History Narrative  . No narrative on file    ROS: no fevers or chills, productive cough, hemoptysis, dysphasia, odynophagia, melena, hematochezia, dysuria, hematuria, rash,  seizure activity, orthopnea, PND, pedal edema, claudication. Remaining systems are negative.  Physical Exam: Well-developed well-nourished in no acute distress.  Skin is warm and dry.  HEENT is normal.  Neck is supple.  Chest is clear to auscultation with normal expansion.  Cardiovascular exam is regular rate and rhythm. 2/6 systolic murmur apex Abdominal exam nontender or distended. No masses palpated. Extremities show no edema. neuro grossly intact

## 2014-01-07 NOTE — Assessment & Plan Note (Signed)
Continue statin. 

## 2014-01-07 NOTE — Patient Instructions (Signed)
Your physician wants you to follow-up in: 6 MONTHS WITH DR CRENSHAW You will receive a reminder letter in the mail two months in advance. If you don't receive a letter, please call our office to schedule the follow-up appointment.  

## 2014-01-07 NOTE — Assessment & Plan Note (Signed)
Blood pressure controlled. Continue present medications. 

## 2014-01-07 NOTE — Assessment & Plan Note (Signed)
Management per nephrology.

## 2014-01-21 ENCOUNTER — Ambulatory Visit (INDEPENDENT_AMBULATORY_CARE_PROVIDER_SITE_OTHER): Payer: Medicare Other | Admitting: Ophthalmology

## 2014-01-21 DIAGNOSIS — E1165 Type 2 diabetes mellitus with hyperglycemia: Secondary | ICD-10-CM

## 2014-01-21 DIAGNOSIS — H35379 Puckering of macula, unspecified eye: Secondary | ICD-10-CM

## 2014-01-21 DIAGNOSIS — E11319 Type 2 diabetes mellitus with unspecified diabetic retinopathy without macular edema: Secondary | ICD-10-CM

## 2014-01-21 DIAGNOSIS — I10 Essential (primary) hypertension: Secondary | ICD-10-CM

## 2014-01-21 DIAGNOSIS — E1139 Type 2 diabetes mellitus with other diabetic ophthalmic complication: Secondary | ICD-10-CM

## 2014-01-21 DIAGNOSIS — H43819 Vitreous degeneration, unspecified eye: Secondary | ICD-10-CM

## 2014-01-21 DIAGNOSIS — H35039 Hypertensive retinopathy, unspecified eye: Secondary | ICD-10-CM

## 2014-04-15 ENCOUNTER — Telehealth: Payer: Self-pay | Admitting: Cardiology

## 2014-04-22 NOTE — Telephone Encounter (Signed)
Closed encounter °

## 2014-07-08 ENCOUNTER — Ambulatory Visit (INDEPENDENT_AMBULATORY_CARE_PROVIDER_SITE_OTHER): Payer: Medicare Other | Admitting: Cardiology

## 2014-07-08 ENCOUNTER — Encounter: Payer: Self-pay | Admitting: Cardiology

## 2014-07-08 VITALS — BP 132/60 | HR 73 | Ht 60.0 in | Wt 143.9 lb

## 2014-07-08 DIAGNOSIS — E785 Hyperlipidemia, unspecified: Secondary | ICD-10-CM

## 2014-07-08 DIAGNOSIS — I10 Essential (primary) hypertension: Secondary | ICD-10-CM

## 2014-07-08 DIAGNOSIS — I2510 Atherosclerotic heart disease of native coronary artery without angina pectoris: Secondary | ICD-10-CM

## 2014-07-08 DIAGNOSIS — I350 Nonrheumatic aortic (valve) stenosis: Secondary | ICD-10-CM

## 2014-07-08 LAB — LIPID PANEL
CHOL/HDL RATIO: 2.7 ratio
Cholesterol: 113 mg/dL (ref 0–200)
HDL: 42 mg/dL (ref >39)
LDL CALC: 53 mg/dL (ref 0–99)
TRIGLYCERIDES: 88 mg/dL (ref <150)
VLDL: 18 mg/dL (ref 0–40)

## 2014-07-08 LAB — HEPATIC FUNCTION PANEL
ALK PHOS: 400 U/L — AB (ref 39–117)
ALT: 33 U/L (ref 0–53)
AST: 38 U/L — ABNORMAL HIGH (ref 0–37)
Albumin: 3.4 g/dL — ABNORMAL LOW (ref 3.5–5.2)
Bilirubin, Direct: 0.2 mg/dL (ref 0.0–0.3)
Indirect Bilirubin: 0.5 mg/dL (ref 0.2–1.2)
Total Bilirubin: 0.7 mg/dL (ref 0.2–1.2)
Total Protein: 7.1 g/dL (ref 6.0–8.3)

## 2014-07-08 NOTE — Assessment & Plan Note (Signed)
Blood pressure controlled. Continue present medications. 

## 2014-07-08 NOTE — Assessment & Plan Note (Signed)
Patient has not had chest pain. Nuclear study mildly abnormal but not high risk. Plan conservative measures if possible. Continue aspirin and statin.

## 2014-07-08 NOTE — Assessment & Plan Note (Signed)
Continue statin. Check lipids and liver. 

## 2014-07-08 NOTE — Assessment & Plan Note (Signed)
Repeat echo when he returns in 6 months.

## 2014-07-08 NOTE — Progress Notes (Signed)
HPI: FU CAD; hx of T2DM, HTN, HL, ESRD on hemodialysis. Previous elevation of cardiac enzymes in setting of influenza. Echocardiogram (09/10/2013): EF 50-55%, normal wall motion, grade 2 diastolic dysfunction, mild aortic stenosis, trivial AI, MAC, moderate MR, mild LAE, moderate to severe TR, moderately increased PASP. Given normal LV function on echocardiogram he was set up for inpatient stress testing. Lexiscan Myoview (09/11/2013): Subtle ischemia in the anteroseptal wall, EF 44%. This was not felt to represent a high risk study. Given his medical co-morbidities, medical therapy was favored. Cardiac catheterization could be considered in the future after recovering from influenza if he develops symptoms.  Since last seen, He denies chest pain, palpitations, syncope or pedal edema. He has some dyspnea on exertion.   Current Outpatient Prescriptions  Medication Sig Dispense Refill  . amLODipine (NORVASC) 5 MG tablet Take 5 mg by mouth daily.       [name] aspirin EC 81 MG tablet Take 1 tablet (81 mg total) by mouth daily.  30 tablet  11  . atorvastatin (LIPITOR) 80 MG tablet Take 80 mg by mouth daily.       . Calcium Carbonate Antacid (TUMS PO) Take 3 tablets by mouth 3 (three) times daily.      . darbepoetin (ARANESP) 100 MCG/0.5ML SOLN injection Inject 0.5 mLs (100 mcg total) into the vein every Monday with hemodialysis.  4.2 mL    . diphenhydrAMINE (BENADRYL) 25 MG tablet Take 25 mg by mouth daily as needed for allergies.      . folic acid-vitamin b complex-vitamin c-selenium-zinc (DIALYVITE) 3 MG TABS Take 1 tablet by mouth daily.        . GuaiFENesin (MUCINEX PO) Take 10 mLs by mouth daily as needed (for allergies).      . Insulin Isophane Human (HUMULIN N PEN [place]) Inject 5-10 Units into the skin 2 (two) times daily.       [name] labetalol (NORMODYNE) 300 MG tablet Take 300 mg by mouth 3 (three) times daily.       [name] omeprazole (PRILOSEC) 20 MG capsule Take 20 mg by mouth daily.         No  current facility-administered medications for this visit.     Past Medical History  Diagnosis Date  . Hyperlipidemia   . Anemia   . Diabetes mellitus   . Hypertension   . ESRD (end stage renal disease)     Dialysis on MONDAY, WEDNESDAY and FRIDAYS  . Hx of echocardiogram     a.  Echocardiogram (09/10/2013): EF 50-55%, normal wall motion, grade 2 diastolic dysfunction, mild aortic stenosis, trivial AI, MAC, moderate MR, mild LAE, moderate to severe TR, moderately increased PASP.  [name] Hx of non-ST elevation myocardial infarction (NSTEMI)     a. in setting of influenza, volume overload 08/2013 => Lexiscan Myoview (09/11/2013): Subtle ischemia in the anteroseptal wall, EF 44%. => med Rx recommended  . Asthma   . Shortness of breath     with exertion  . Flu 08/2014  . History of blood transfusion   . HOH (hard of hearing)     Past Surgical History  Procedure Laterality Date  . Laparoscopic gastrotomy w/ repair of ulcer    . Av fistula placement  2008    Left Upper Arm  . Toe amputation Right     all toes  . Revison of arteriovenous fistula Left 11/21/2013    Procedure: REVISION OF ARTERIOVENOUS FISTULA;  Surgeon: Angelia Mould, MD;  Location: [facility];  Service: Vascular;  Laterality: Left;    History   Social History  . Marital Status: Married    Spouse Name: N/A    Number of Children: N/A  . Years of Education: N/A   Occupational History  . Not on file.   Social History Main Topics  . Smoking status: Never Smoker   . Smokeless tobacco: Not on file  . Alcohol Use: No  . Drug Use: No  . Sexual Activity: Not on file   Other Topics Concern  . Not on file   Social History Narrative  . No narrative on file    ROS: no fevers or chills, productive cough, hemoptysis, dysphasia, odynophagia, melena, hematochezia, dysuria, hematuria, rash, seizure activity, orthopnea, PND, pedal edema, claudication. Remaining systems are negative.  Physical Exam: Well-developed  well-nourished in no acute distress.  Skin is warm and dry.  HEENT is normal.  Neck is supple.  Chest is clear to auscultation with normal expansion.  Cardiovascular exam is regular rate and rhythm. 2/6 systolic murmur left sternal border. S2 is not diminished. Abdominal exam nontender or distended. No masses palpated. Extremities show no edema. Left upper extremity with AV fistula. neuro grossly intact  ECG Sinus rhythm at a rate of 73. Left axis deviation. Nonspecific ST changes.

## 2014-07-08 NOTE — Patient Instructions (Signed)
Your physician wants you to follow-up in: 6 MONTHS WITH DR CRENSHAW You will receive a reminder letter in the mail two months in advance. If you don't receive a letter, please call our office to schedule the follow-up appointment.   Your physician recommends that you HAVE LAB WORK TODAY 

## 2014-07-15 ENCOUNTER — Encounter: Payer: Self-pay | Admitting: *Deleted

## 2014-07-18 ENCOUNTER — Encounter: Payer: Self-pay | Admitting: Cardiology

## 2014-07-18 NOTE — Telephone Encounter (Signed)
This encounter was created in error - please disregard.

## 2014-07-18 NOTE — Telephone Encounter (Signed)
Please call,received lab results. She have some questions,she said she have to leave in about 15 minutes.

## 2014-07-24 ENCOUNTER — Ambulatory Visit (INDEPENDENT_AMBULATORY_CARE_PROVIDER_SITE_OTHER): Payer: Medicare Other | Admitting: Ophthalmology

## 2014-07-24 DIAGNOSIS — H43813 Vitreous degeneration, bilateral: Secondary | ICD-10-CM

## 2014-07-24 DIAGNOSIS — H35033 Hypertensive retinopathy, bilateral: Secondary | ICD-10-CM | POA: Diagnosis not present

## 2014-07-24 DIAGNOSIS — I10 Essential (primary) hypertension: Secondary | ICD-10-CM

## 2014-07-24 DIAGNOSIS — E11329 Type 2 diabetes mellitus with mild nonproliferative diabetic retinopathy without macular edema: Secondary | ICD-10-CM

## 2014-07-24 DIAGNOSIS — E11319 Type 2 diabetes mellitus with unspecified diabetic retinopathy without macular edema: Secondary | ICD-10-CM

## 2014-08-19 DIAGNOSIS — J111 Influenza due to unidentified influenza virus with other respiratory manifestations: Secondary | ICD-10-CM

## 2014-08-19 HISTORY — DX: Influenza due to unidentified influenza virus with other respiratory manifestations: J11.1

## 2014-09-29 ENCOUNTER — Other Ambulatory Visit: Payer: Self-pay | Admitting: Physician Assistant

## 2014-11-27 ENCOUNTER — Other Ambulatory Visit: Payer: Self-pay | Admitting: Internal Medicine

## 2014-11-27 DIAGNOSIS — R443 Hallucinations, unspecified: Secondary | ICD-10-CM

## 2014-12-02 ENCOUNTER — Ambulatory Visit
Admission: RE | Admit: 2014-12-02 | Discharge: 2014-12-02 | Disposition: A | Discharge status: Home / Self Care | Payer: Medicare Other | Source: Ambulatory Visit | Attending: Internal Medicine | Admitting: Internal Medicine

## 2014-12-02 DIAGNOSIS — R443 Hallucinations, unspecified: Secondary | ICD-10-CM

## 2015-01-15 ENCOUNTER — Encounter: Payer: Self-pay | Admitting: Neurology

## 2015-01-15 ENCOUNTER — Ambulatory Visit (INDEPENDENT_AMBULATORY_CARE_PROVIDER_SITE_OTHER): Payer: Medicare Other | Admitting: Neurology

## 2015-01-15 DIAGNOSIS — F0391 Unspecified dementia with behavioral disturbance: Secondary | ICD-10-CM

## 2015-01-15 DIAGNOSIS — F03B18 Unspecified dementia, moderate, with other behavioral disturbance: Secondary | ICD-10-CM

## 2015-01-15 NOTE — Progress Notes (Signed)
HPI: FU CAD; hx of T2DM, HTN, HL, ESRD on hemodialysis. Previous elevation of cardiac enzymes in setting of influenza. Echocardiogram (09/10/2013): EF 50-55%, normal wall motion, grade 2 diastolic dysfunction, mild aortic stenosis, trivial AI, MAC, moderate MR, mild LAE, moderate to severe TR, moderately increased PASP. Given normal LV function on echocardiogram he was set up for inpatient stress testing. Lexiscan Myoview (09/11/2013): Subtle ischemia in the anteroseptal wall, EF 44%. This was not felt to represent a high risk study. Given his medical co-morbidities, medical therapy was favored. Cardiac catheterization could be considered in the future after recovering from influenza if he develops symptoms.  Since last seen,   Current Outpatient Prescriptions  Medication Sig Dispense Refill  . amLODipine (NORVASC) 5 MG tablet Take 5 mg by mouth daily.     [name] aspirin 81 MG EC tablet TAKE 1 TABLET (81 MG TOTAL) BY MOUTH DAILY. 30 tablet 11  . atorvastatin (LIPITOR) 80 MG tablet Take 80 mg by mouth daily.     . B Complex-C-Folic Acid (DIALYVITE 825 PO) Take by mouth daily.    . darbepoetin (ARANESP) 100 MCG/0.5ML SOLN injection Inject 0.5 mLs (100 mcg total) into the vein every Monday with hemodialysis. 4.2 mL   . folic acid-vitamin b complex-vitamin c-selenium-zinc (DIALYVITE) 3 MG TABS Take 1 tablet by mouth daily.      . Insulin Isophane Human (HUMULIN N PEN [place]) Inject 5-10 Units into the skin 2 (two) times daily.     [name] labetalol (NORMODYNE) 300 MG tablet Take 300 mg by mouth 3 (three) times daily.     . memantine (NAMENDA) 5 MG tablet Take 5 mg by mouth daily. Take 1 tablet daily  3  . omeprazole (PRILOSEC) 20 MG capsule Take 20 mg by mouth daily.      . risperiDONE (RISPERDAL) 0.5 MG tablet Take 2 tablets at bedtime     No current facility-administered medications for this visit.     Past Medical History  Diagnosis Date  . Hyperlipidemia   . Anemia   . Diabetes mellitus   .  Hypertension   . ESRD (end stage renal disease)     Dialysis on MONDAY, WEDNESDAY and FRIDAYS  . Hx of echocardiogram     a.  Echocardiogram (09/10/2013): EF 50-55%, normal wall motion, grade 2 diastolic dysfunction, mild aortic stenosis, trivial AI, MAC, moderate MR, mild LAE, moderate to severe TR, moderately increased PASP.  [name] Hx of non-ST elevation myocardial infarction (NSTEMI)     a. in setting of influenza, volume overload 08/2013 => Lexiscan Myoview (09/11/2013): Subtle ischemia in the anteroseptal wall, EF 44%. => med Rx recommended  . Asthma   . Shortness of breath     with exertion  . Flu 08/2014  . History of blood transfusion   . HOH (hard of hearing)     Past Surgical History  Procedure Laterality Date  . Laparoscopic gastrotomy w/ repair of ulcer    . Av fistula placement  2008    Left Upper Arm  . Toe amputation Right     all toes  . Revison of arteriovenous fistula Left 11/21/2013    Procedure: REVISION OF ARTERIOVENOUS FISTULA;  Surgeon: Angelia Mould, MD;  Location: [facility];  Service: Vascular;  Laterality: Left;    History   Social History  . Marital Status: Married    Spouse Name: N/A  . Number of Children: 7  . Years of Education: N/A   Occupational History  .  Retired    Social History Main Topics  . Smoking status: Never Smoker   . Smokeless tobacco: Never Used  . Alcohol Use: No  . Drug Use: No  . Sexual Activity: Not on file   Other Topics Concern  . Not on file   Social History Narrative    ROS: no fevers or chills, productive cough, hemoptysis, dysphasia, odynophagia, melena, hematochezia, dysuria, hematuria, rash, seizure activity, orthopnea, PND, pedal edema, claudication. Remaining systems are negative.  Physical Exam: Well-developed well-nourished in no acute distress.  Skin is warm and dry.  HEENT is normal.  Neck is supple.  Chest is clear to auscultation with normal expansion.  Cardiovascular exam is regular rate and  rhythm.  Abdominal exam nontender or distended. No masses palpated. Extremities show no edema. neuro grossly intact  ECG     This encounter was created in error - please disregard.

## 2015-01-15 NOTE — Progress Notes (Signed)
NEUROLOGY CONSULTATION NOTE  Steven Forbes MRN: [id number] DOB: 07/16/1926  Referring provider: Dr. Shon Baton Primary care provider: Dr. Shon Baton  Reason for consult:  dementia  Dear Dr Virgina Jock:  Thank you for your kind referral of Steven Forbes for consultation of the above symptoms. Although his history is well known to you, please allow me to reiterate it for the purpose of our medical record. The patient was accompanied to the clinic by his wife and daughter who also provide collateral information. Records and images were personally reviewed where available.  HISTORY OF PRESENT ILLNESS: This is a very pleasant 81 year old right-handed man with a history of diabetes, hypertension, hyperlipidemia, ESRD on HD, presenting for evaluation of dementia. He reports his memory is good, "I can remember everything." His family reports that he started repeating himself over the past year, but that this has been getting worse. Over the past 3 months, he has had paranoia, accusing his wife of being with another man when he goes for his dialysis treatments. He gets loud and irate. They report around 9 months ago he had auditory hallucinations reporting he hears a baby crying. On review of PCP notes, he has been having hallucinations since 4/204. He had been tried on Aricept in the past which caused diarrhea. His family reported visual hallucinations where he would pick at lint on his clothes all day, thinking it was moving. This started around 6 months ago. He was started on Namenda last month. Risperdal was started as well, and his family feels this has helped with the visual hallucinations. They report that he remembers his doctor's appointments, and that he drives without difficulty. His wife rides in the car with him and denies and problems driving except one time 9 months ago when he got lost and confused how to get home, he was able to ask for directions and get home. His wife has been making sure he  takes his medications since 2007. She has always been in charge of bills. They deny any difficulties performing ADLs (dressing, bathing), however last Easter where he usually dresses up, he came in casual attire, saying "come as you are."  He is very pleasant in the office today, repeating several times that he was overseas in [place] and stating that he owns this building and that he will be giving me a raise.   He denies any headaches, dizziness, diplopia, dysarthria, dysphagia, neck/back pain, focal numbness/tingling/weakness, bowel/bladder dysfunction. No anosmia, tremors, no falls. He has 2 or 3 cousins with memory loss.   Laboratory Data: 12/09/14: RPR nonreactive, BMP showed a creatinine of 3.8, BUN 15, Na 142. CBC normal. LFTs norma except for elevated Alk Phos 221. He has a head CT without contrast done 12/02/14 which I personally reviewed, there was mild, likely age-appropriate atrophy and scattered periventricular hypodensities, similar to 2010.   PAST MEDICAL HISTORY: Past Medical History  Diagnosis Date  . Hyperlipidemia   . Anemia   . Diabetes mellitus   . Hypertension   . ESRD (end stage renal disease)     Dialysis on MONDAY, WEDNESDAY and FRIDAYS  . Hx of echocardiogram     a.  Echocardiogram (09/10/2013): EF 50-55%, normal wall motion, grade 2 diastolic dysfunction, mild aortic stenosis, trivial AI, MAC, moderate MR, mild LAE, moderate to severe TR, moderately increased PASP.  [name] Hx of non-ST elevation myocardial infarction (NSTEMI)     a. in setting of influenza, volume overload 08/2013 => Lexiscan Myoview (09/11/2013): Subtle ischemia  in the anteroseptal wall, EF 44%. => med Rx recommended  . Asthma   . Shortness of breath     with exertion  . Flu 08/2014  . History of blood transfusion   . HOH (hard of hearing)     PAST SURGICAL HISTORY: Past Surgical History  Procedure Laterality Date  . Laparoscopic gastrotomy w/ repair of ulcer    . Av fistula placement  2008    Left  Upper Arm  . Toe amputation Right     all toes  . Revison of arteriovenous fistula Left 11/21/2013    Procedure: REVISION OF ARTERIOVENOUS FISTULA;  Surgeon: Angelia Mould, MD;  Location: [facility] OR;  Service: Vascular;  Laterality: Left;    MEDICATIONS: Current Outpatient Prescriptions on File Prior to Visit  Medication Sig Dispense Refill  . amLODipine (NORVASC) 5 MG tablet Take 5 mg by mouth daily.     [name] aspirin 81 MG EC tablet TAKE 1 TABLET (81 MG TOTAL) BY MOUTH DAILY. 30 tablet 11  . atorvastatin (LIPITOR) 80 MG tablet Take 80 mg by mouth daily.     . darbepoetin (ARANESP) 100 MCG/0.5ML SOLN injection Inject 0.5 mLs (100 mcg total) into the vein every Monday with hemodialysis. 4.2 mL   . folic acid-vitamin b complex-vitamin c-selenium-zinc (DIALYVITE) 3 MG TABS Take 1 tablet by mouth daily.      . Insulin Isophane Human (HUMULIN N PEN [place]) Inject 5-10 Units into the skin 2 (two) times daily.     [name] labetalol (NORMODYNE) 300 MG tablet Take 300 mg by mouth 3 (three) times daily.     [name] omeprazole (PRILOSEC) 20 MG capsule Take 20 mg by mouth daily.       No current facility-administered medications on file prior to visit.    ALLERGIES: No Known Allergies  FAMILY HISTORY: Family History  Problem Relation Age of Onset  . Cancer Mother   . Cancer Father   . Diabetes Son   . Diabetes Daughter   . Diabetes Daughter   . Diabetes Cousin     SOCIAL HISTORY: History   Social History  . Marital Status: Married    Spouse Name: N/A  . Number of Children: 7  . Years of Education: N/A   Occupational History  . Retired    Social History Main Topics  . Smoking status: Never Smoker   . Smokeless tobacco: Never Used  . Alcohol Use: No  . Drug Use: No  . Sexual Activity: Not on file   Other Topics Concern  . Not on file   Social History Narrative    REVIEW OF SYSTEMS: Constitutional: No fevers, chills, or sweats, no generalized fatigue, change in appetite Eyes: No visual  changes, double vision, eye pain Ear, nose and throat: No hearing loss, ear pain, nasal congestion, sore throat Cardiovascular: No chest pain, palpitations Respiratory:  No shortness of breath at rest or with exertion, wheezes GastrointestinaI: No nausea, vomiting, diarrhea, abdominal pain, fecal incontinence Genitourinary:  No dysuria, urinary retention or frequency Musculoskeletal:  No neck pain, back pain Integumentary: No rash, pruritus, skin lesions Neurological: as above Psychiatric: No depression, insomnia, anxiety Endocrine: No palpitations, fatigue, diaphoresis, mood swings, change in appetite, change in weight, increased thirst Hematologic/Lymphatic:  No anemia, purpura, petechiae. Allergic/Immunologic: no itchy/runny eyes, nasal congestion, recent allergic reactions, rashes  PHYSICAL EXAM: Filed Vitals:   01/15/15 0848  BP: 128/66  Pulse: 88  Resp: 16   General: No acute distress, patient has delusions that he  owns this building and states he will give me a raise Head:  Normocephalic/atraumatic Eyes: Fundoscopic exam shows bilateral sharp discs, no vessel changes, exudates, or hemorrhages Neck: supple, no paraspinal tenderness, full range of motion Back: No paraspinal tenderness Heart: regular rate and rhythm Lungs: Clear to auscultation bilaterally. Vascular: No carotid bruits. Skin/Extremities: No rash, no edema Neurological Exam: Mental status: alert and oriented to person, place, and month. Did not know year (2001). No dysarthria or aphasia, Fund of knowledge is appropriate.  Recent and remote memory are impaired.  Attention and concentration are impaired.    Able to name objects, difficulty with repetition. Wrote "clock" when asked to draw a clock, then able to draw clock with numbers correct but wrong hands. MMSE - Mini Mental State Exam 01/15/2015  Orientation to time 2  Orientation to Place 5  Registration 3  Attention/ Calculation 1  Recall 0  Language- name 2  objects 2  Language- repeat 0  Language- follow 3 step command 2  Language- read & follow direction 1  Write a sentence 1  Copy design 0  Total score 17   Cranial nerves: CN I: not tested CN II: pupils equal, round and reactive to light, visual fields intact, fundi unremarkable. CN III, IV, VI:  full range of motion, no nystagmus, no ptosis CN V: facial sensation intact CN VII: upper and lower face symmetric CN VIII: hearing intact to finger rub CN IX, X: gag intact, uvula midline CN XI: sternocleidomastoid and trapezius muscles intact CN XII: tongue midline Bulk & Tone: normal, no cogwheeling, no fasciculations. Motor: 5/5 throughout with no pronator drift. Sensation: intact to light touch, cold, pin, vibration and joint position sense.  No extinction to double simultaneous stimulation.  Romberg test negative Deep Tendon Reflexes: +1 throughout except for absent ankle jerks bilaterally, no ankle clonus Plantar responses: downgoing bilaterally Cerebellar: no incoordination on finger to nose testing Gait: slow and cautious, no ataxia Tremor: none  IMPRESSION: This is a very pleasant 81 year old right-handed man with a history of hypertension, hyperlipidemia, diabetes, ESRD on HD, presenting with memory loss with hallucinations and delusions. His MMSE today is 17/30, indicating moderate dementia, likely Alzheimer's type versus Lewy Body dementia with the significant hallucinations he has been having. Exam does not show any other neurological signs that can be seen with dialysis dementia (dysarthria, apraxia, myoclonus). Findings were discussed at length with his wife and daughter. His diagnosis was explained, his family has had difficulty understanding this, particularly his paranoia towards his wife. Continue Namenda and Risperdal. We discussed 24/7 supervision, and potential need for more help at home since his wife is his primary caregiver. He continues to drive, I recommended to stop  driving, however his family is unsure how to go about this and will have a DMV driving assessment done. He will follow-up with his PCP and his family knows to call our office for any questions/concerns.  Thank you for allowing me to participate in the care of this patient. Please do not hesitate to call for any questions or concerns.   Ellouise Newer, M.D.  CC: Dr. Virgina Jock

## 2015-01-15 NOTE — Patient Instructions (Signed)
1. Continue all your current medications 2. Contact the DMV for Driving Assessment, my recommendation is to stop driving 3. Continue to monitor need for help at home

## 2015-01-20 ENCOUNTER — Encounter: Payer: Self-pay | Admitting: Cardiology

## 2015-01-21 DIAGNOSIS — F0390 Unspecified dementia without behavioral disturbance: Secondary | ICD-10-CM | POA: Insufficient documentation

## 2015-01-21 DIAGNOSIS — F03B0 Unspecified dementia, moderate, without behavioral disturbance, psychotic disturbance, mood disturbance, and anxiety: Secondary | ICD-10-CM | POA: Insufficient documentation

## 2015-01-27 ENCOUNTER — Ambulatory Visit (INDEPENDENT_AMBULATORY_CARE_PROVIDER_SITE_OTHER): Payer: Medicare Other | Admitting: Ophthalmology

## 2015-01-27 DIAGNOSIS — E11329 Type 2 diabetes mellitus with mild nonproliferative diabetic retinopathy without macular edema: Secondary | ICD-10-CM | POA: Diagnosis not present

## 2015-01-27 DIAGNOSIS — I10 Essential (primary) hypertension: Secondary | ICD-10-CM | POA: Diagnosis not present

## 2015-01-27 DIAGNOSIS — H43813 Vitreous degeneration, bilateral: Secondary | ICD-10-CM

## 2015-01-27 DIAGNOSIS — E11319 Type 2 diabetes mellitus with unspecified diabetic retinopathy without macular edema: Secondary | ICD-10-CM | POA: Diagnosis not present

## 2015-01-27 DIAGNOSIS — H35033 Hypertensive retinopathy, bilateral: Secondary | ICD-10-CM

## 2015-01-27 DIAGNOSIS — H35372 Puckering of macula, left eye: Secondary | ICD-10-CM

## 2015-06-22 ENCOUNTER — Encounter: Payer: Self-pay | Admitting: Vascular Surgery

## 2015-06-24 ENCOUNTER — Ambulatory Visit (INDEPENDENT_AMBULATORY_CARE_PROVIDER_SITE_OTHER): Payer: Medicare Other | Admitting: Vascular Surgery

## 2015-06-24 ENCOUNTER — Encounter: Payer: Self-pay | Admitting: Vascular Surgery

## 2015-06-24 VITALS — BP 187/80 | HR 80 | Temp 98.4°F | Ht 63.0 in | Wt 166.0 lb

## 2015-06-24 DIAGNOSIS — N185 Chronic kidney disease, stage 5: Secondary | ICD-10-CM | POA: Diagnosis not present

## 2015-06-24 NOTE — Progress Notes (Signed)
[facility]ascular and [facility]in Specialist of [facility]  Patient name: Steven Forbes MRN: [id number] DOB: 14-Apr-1926 Sex: male  REASON FOR CONSULT: Ulceration of AV fistula  HPI: Steven Forbes is a 81 y.o. 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 We elected to address the largest one first with plans to repair the second one in a staged fashion in the future. 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 I have not seen him since that time.  He was sent from the dialysis Center because of a small eschar over his aneurysmal left upper arm fistula. He denies fever or chills. He's had no drainage from the wound. He dialyzes on Monday Wednesdays and Fridays. The fistula has been in for 6 years.  Past Medical History  Diagnosis Date  . Hyperlipidemia   . Anemia   . Diabetes mellitus   . Hypertension   . ESRD (end stage renal disease) ([place])     Dialysis on MONDAY, [place] and FRIDAYS  . Hx of echocardiogram     a.  Echocardiogram (09/10/2013): EF 50-55%, normal wall motion, grade 2 diastolic dysfunction, mild aortic stenosis, trivial AI, MAC, moderate MR, mild LAE, moderate to severe TR, moderately increased PASP.  [name] Hx of non-ST elevation myocardial infarction (NSTEMI)     a. in setting of influenza, volume overload 08/2013 => Lexiscan Myoview (09/11/2013): Subtle ischemia in the anteroseptal wall, EF 44%. => med Rx recommended  . Asthma   . Shortness of breath     with exertion  . Flu 08/2014  . History of blood transfusion   . HOH (hard of hearing)    Family History  Problem Relation Age of Onset  . Cancer Mother   . Hypertension Mother   . Cancer Father   . Diabetes Father   . Hypertension Father   . Diabetes Son   . Diabetes Daughter   . Diabetes Daughter   . Diabetes Cousin    SOCIAL HISTORY: Social History   Social History  . Marital Status: Married    Spouse Name: N/A  . Number of Children: 7  . Years  of Education: N/A   Occupational History  . Retired    Social History Main Topics  . Smoking status: Never Smoker   . Smokeless tobacco: Never Used  . Alcohol Use: No  . Drug Use: No  . Sexual Activity: Not on file   Other Topics Concern  . Not on file   Social History Narrative   No Known Allergies Current Outpatient Prescriptions  Medication Sig Dispense Refill  . amLODipine (NORVASC) 5 MG tablet Take 5 mg by mouth daily.     [name] aspirin 81 MG EC tablet TAKE 1 TABLET (81 MG TOTAL) BY MOUTH DAILY. 30 tablet 11  . atorvastatin (LIPITOR) 80 MG tablet Take 80 mg by mouth daily.     . B Complex-C-Folic Acid (DIALYVITE 614 PO) Take by mouth daily.    . darbepoetin (ARANESP) 100 MCG/0.5ML SOLN injection Inject 0.5 mLs (100 mcg total) into the vein every Monday with hemodialysis. 4.2 mL   . folic acid-vitamin b complex-vitamin c-selenium-zinc (DIALYVITE) 3 MG TABS Take 1 tablet by mouth daily.      . Insulin Isophane Human (HUMULIN N PEN [place]) Inject 5-10 Units into the skin 2 (two) times daily.     [name] labetalol (NORMODYNE) 300 MG tablet Take 300 mg by mouth 3 (three) times daily.     [name]  memantine (NAMENDA) 5 MG tablet Take 5 mg by mouth daily. Take 1 tablet daily  3  . omeprazole (PRILOSEC) 20 MG capsule Take 20 mg by mouth daily.      . risperiDONE (RISPERDAL) 0.5 MG tablet Take 2 tablets at bedtime     No current facility-administered medications for this visit.   REVIEW OF SYSTEMS:  [X]  denotes positive finding, [ ]  denotes negative finding Cardiac  Comments:  Chest pain or chest pressure:    Shortness of breath upon exertion:    Short of breath when lying flat:    Irregular heart rhythm:        Vascular    Pain in calf, thigh, or hip brought on by ambulation:    Pain in feet at night that wakes you up from your sleep:     Blood clot in your veins:    Leg swelling:         Pulmonary    Oxygen at home:    Productive cough:     Wheezing:  X History of asthma      Neurologic     Sudden weakness in arms or legs:     Sudden numbness in arms or legs:     Sudden onset of difficulty speaking or slurred speech:    Temporary loss of vision in one eye:     Problems with dizziness:         Gastrointestinal    Blood in stool:     Vomited blood:         Genitourinary    Burning when urinating:     Blood in urine:        Psychiatric    Major depression:         Hematologic    Bleeding problems:    Problems with blood clotting too easily:        Skin    Rashes or ulcers:        Constitutional    Fever or chills:      PHYSICAL EXAM: Filed Vitals:   06/24/15 1019  BP: 187/80  Pulse: 80  Temp: 98.4 F (36.9 C)  TempSrc: Oral  Height: 5\' 3"  (1.6 m)  Weight: 166 lb (75.297 kg)  SpO2: 98%   GENERAL: The patient is a well-nourished male, in no acute distress. The vital signs are documented above. CARDIAC: There is a regular rate and rhythm.  VASCULAR: his upper arm fistula on the left has an excellent thrill and bruit. It is aneurysmal. PULMONARY: There is good air exchange bilaterally without wheezing or rales. ABDOMEN: Soft and non-tender with normal pitched bowel sounds.  MUSCULOSKELETAL: There are no major deformities or cyanosis. NEUROLOGIC: No focal weakness or paresthesias are detected. SKIN: there is one small eschar on the fistula which has been present for several months according to the family. The family states that this has been gradually improving. There is no drainage or erythema associated with this. PSYCHIATRIC: The patient has a normal affect.  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 It is fairly small and I think it would be reasonable to continue to follow this for now and only consider revision of the fistula if this were not improving. If this shows signs of enlarging and certainly weaken operate and possibly  plicate the aneurysm in this area. I will see him back in 1 month.  He knows to call sooner if he has problems.  Deitra Mayo [facility] [place]: [phone]

## 2015-07-27 ENCOUNTER — Encounter: Payer: Self-pay | Admitting: Vascular Surgery

## 2015-07-29 ENCOUNTER — Encounter: Payer: Self-pay | Admitting: Vascular Surgery

## 2015-07-29 ENCOUNTER — Ambulatory Visit (INDEPENDENT_AMBULATORY_CARE_PROVIDER_SITE_OTHER): Payer: Medicare Other | Admitting: Vascular Surgery

## 2015-07-29 VITALS — BP 107/68 | HR 92 | Temp 98.0°F | Resp 16 | Ht 62.0 in | Wt 164.0 lb

## 2015-07-29 DIAGNOSIS — N185 Chronic kidney disease, stage 5: Secondary | ICD-10-CM | POA: Diagnosis not present

## 2015-07-29 NOTE — Progress Notes (Signed)
   Patient name: Steven Forbes MRN: [id number] DOB: January 12, 1926 Sex: male  REASON FOR VISIT: follow up after plication of large aneurysm of AV fistula  HPI: Steven Forbes is a 81 y.o. male who had 2 large aneurysms of his left upper arm AV fistula. In March 6945 we plicated the largest aneurysm in his upper arm fistula. He was then lost to follow up. I [place] month ago because the dialysis center was concerned about some ulceration over the fistula. This was improving and therefore we elected to follow this and he comes in for a one-month follow up visit. He states that his fistula has been working well. He has no specific complaints are for her will to the fistula.  Current Outpatient Prescriptions  Medication Sig Dispense Refill  . amLODipine (NORVASC) 5 MG tablet Take 5 mg by mouth daily.     [name] aspirin 81 MG EC tablet TAKE 1 TABLET (81 MG TOTAL) BY MOUTH DAILY. 30 tablet 11  . atorvastatin (LIPITOR) 80 MG tablet Take 80 mg by mouth daily.     . B Complex-C-Folic Acid (DIALYVITE 038 PO) Take by mouth daily.    . darbepoetin (ARANESP) 100 MCG/0.5ML SOLN injection Inject 0.5 mLs (100 mcg total) into the vein every Monday with hemodialysis. 4.2 mL   . folic acid-vitamin b complex-vitamin c-selenium-zinc (DIALYVITE) 3 MG TABS Take 1 tablet by mouth daily.      . Insulin Isophane Human (HUMULIN N PEN [place]) Inject 5-10 Units into the skin 2 (two) times daily.     [name] labetalol (NORMODYNE) 300 MG tablet Take 300 mg by mouth 3 (three) times daily.     . memantine (NAMENDA) 5 MG tablet Take 5 mg by mouth daily. Take 1 tablet daily  3  . omeprazole (PRILOSEC) 20 MG capsule Take 20 mg by mouth daily.      . risperiDONE (RISPERDAL) 0.5 MG tablet Take 2 tablets at bedtime     No current facility-administered medications for this visit.    REVIEW OF SYSTEMS:  [X]  denotes positive finding, [ ]  denotes negative finding Cardiac  Comments:  Chest pain or chest pressure:    Shortness of breath upon exertion:      Short of breath when lying flat:    Irregular heart rhythm:    Constitutional    Fever or chills:      PHYSICAL EXAM: Filed Vitals:   07/29/15 0944  BP: 107/68  Pulse: 92  Temp: 98 F (36.7 C)  TempSrc: Oral  Resp: 16  Height: 5\' 2"  (1.575 m)  Weight: 164 lb (74.39 kg)  SpO2: 98%    GENERAL: The patient is a well-nourished male, in no acute distress. The vital signs are documented above. CARDIOVASCULAR: There is a regular rate and rhythm. PULMONARY: There is good air exchange bilaterally without wheezing or rales. This fistula has an excellent bruit and thrill. It is generally aneurysmal. There are no ulcerations.  MEDICAL ISSUES: STAGE V CHRONIC KIDNEY DISEASE: He has a large aneurysmal left brachiocephalic fistula but this is working well and currently there are no ulcerations overlying the fistula. We will avoid plication of the aneurysms unless the fistula becomes problematic. I'll see him back as needed.  Deitra Mayo [facility] [place]: [phone]

## 2015-08-04 ENCOUNTER — Ambulatory Visit (INDEPENDENT_AMBULATORY_CARE_PROVIDER_SITE_OTHER): Payer: Medicare Other | Admitting: Ophthalmology

## 2015-09-22 ENCOUNTER — Other Ambulatory Visit: Payer: Self-pay | Admitting: Cardiology

## 2015-09-22 NOTE — Telephone Encounter (Signed)
Rx request sent to pharmacy.  

## 2015-10-02 ENCOUNTER — Emergency Department (HOSPITAL_COMMUNITY)
Admission: EM | Admit: 2015-10-02 | Discharge: 2015-10-02 | Disposition: A | Discharge status: Home / Self Care | Payer: Medicare Other | Attending: Emergency Medicine | Admitting: Emergency Medicine

## 2015-10-02 ENCOUNTER — Ambulatory Visit: Payer: Medicare Other | Admitting: Vascular Surgery

## 2015-10-02 ENCOUNTER — Encounter (HOSPITAL_COMMUNITY): Payer: Self-pay

## 2015-10-02 DIAGNOSIS — Z9221 Personal history of antineoplastic chemotherapy: Secondary | ICD-10-CM | POA: Insufficient documentation

## 2015-10-02 DIAGNOSIS — I120 Hypertensive chronic kidney disease with stage 5 chronic kidney disease or end stage renal disease: Secondary | ICD-10-CM | POA: Diagnosis not present

## 2015-10-02 DIAGNOSIS — Z794 Long term (current) use of insulin: Secondary | ICD-10-CM | POA: Insufficient documentation

## 2015-10-02 DIAGNOSIS — H9190 Unspecified hearing loss, unspecified ear: Secondary | ICD-10-CM | POA: Diagnosis not present

## 2015-10-02 DIAGNOSIS — E785 Hyperlipidemia, unspecified: Secondary | ICD-10-CM | POA: Insufficient documentation

## 2015-10-02 DIAGNOSIS — Z452 Encounter for adjustment and management of vascular access device: Secondary | ICD-10-CM | POA: Diagnosis present

## 2015-10-02 DIAGNOSIS — J45909 Unspecified asthma, uncomplicated: Secondary | ICD-10-CM | POA: Diagnosis not present

## 2015-10-02 DIAGNOSIS — N186 End stage renal disease: Secondary | ICD-10-CM | POA: Diagnosis not present

## 2015-10-02 DIAGNOSIS — D649 Anemia, unspecified: Secondary | ICD-10-CM | POA: Insufficient documentation

## 2015-10-02 DIAGNOSIS — Z7982 Long term (current) use of aspirin: Secondary | ICD-10-CM | POA: Insufficient documentation

## 2015-10-02 DIAGNOSIS — Z79899 Other long term (current) drug therapy: Secondary | ICD-10-CM | POA: Insufficient documentation

## 2015-10-02 DIAGNOSIS — Z992 Dependence on renal dialysis: Secondary | ICD-10-CM | POA: Diagnosis not present

## 2015-10-02 NOTE — Progress Notes (Signed)
Called to assess AVF. Area of erosion near base of fistula 2cm x 3 cm. Not bleeding. No complications otherwise.

## 2015-10-02 NOTE — ED Provider Notes (Signed)
CSN: [id number]     Arrival date & time 10/02/15  1824 History   First MD Initiated Contact with Patient 10/02/15 2151     Chief Complaint  Patient presents with  . Vascular Access Problem     (Consider location/radiation/quality/duration/timing/severity/associated sxs/prior Treatment) HPI  Level V Caveat- dementia  Patient to the ER bib wife. He did his dialysis today and the new technician told them to come to the ER to have his fistula evaluated. The wife says she was a Futures trader and told her she saw "white stuff coming out of it", the wife has seen it and says it looks exactly the same to her. The patient has dementia but says he feels fine. Per wife he has been acting normal, afebrile, eating and drinking well.  Pt has a vascular surgeon and has had complications to his fistulas in the past.    Past Medical History  Diagnosis Date  . Hyperlipidemia   . Anemia   . Diabetes mellitus   . Hypertension   . ESRD (end stage renal disease) ([place])     Dialysis on MONDAY, [place] and FRIDAYS  . Hx of echocardiogram     a.  Echocardiogram (09/10/2013): EF 50-55%, normal wall motion, grade 2 diastolic dysfunction, mild aortic stenosis, trivial AI, MAC, moderate MR, mild LAE, moderate to severe TR, moderately increased PASP.  [name] Hx of non-ST elevation myocardial infarction (NSTEMI)     a. in setting of influenza, volume overload 08/2013 => Lexiscan Myoview (09/11/2013): Subtle ischemia in the anteroseptal wall, EF 44%. => med Rx recommended  . Asthma   . Shortness of breath     with exertion  . Flu 08/2014  . History of blood transfusion   . HOH (hard of hearing)    Past Surgical History  Procedure Laterality Date  . Laparoscopic gastrotomy w/ repair of ulcer    . Av fistula placement  2008    Left Upper Arm  . Toe amputation Right     all toes  . Revison of arteriovenous fistula Left 11/21/2013    Procedure: REVISION OF ARTERIOVENOUS FISTULA;  Surgeon: Angelia Mould,  MD;  Location: [facility] OR;  Service: Vascular;  Laterality: Left;   Family History  Problem Relation Age of Onset  . Cancer Mother   . Hypertension Mother   . Cancer Father   . Diabetes Father   . Hypertension Father   . Diabetes Son   . Diabetes Daughter   . Diabetes Daughter   . Diabetes Cousin    Social History  Substance Use Topics  . Smoking status: Never Smoker   . Smokeless tobacco: Never Used  . Alcohol Use: No    Review of Systems  Level V caveat- dementia  Allergies  Review of patient's allergies indicates no known allergies.  Home Medications   Prior to Admission medications   Medication Sig Start Date End Date Taking? Authorizing Provider  amLODipine (NORVASC) 5 MG tablet Take 5 mg by mouth daily.  09/28/13  Yes Historical Provider, MD  aspirin 81 MG EC tablet TAKE 1 TABLET (81 MG TOTAL) BY MOUTH DAILY. 09/22/15  Yes Lelon Perla, MD  atorvastatin (LIPITOR) 80 MG tablet Take 80 mg by mouth daily.  09/23/13  Yes Historical Provider, MD  darbepoetin (ARANESP) 100 MCG/0.5ML SOLN injection Inject 0.5 mLs (100 mcg total) into the vein every Monday with hemodialysis. 09/13/13  Yes Charlynne Cousins, MD  Insulin Isophane Human (HUMULIN N PEN [place]) Inject 5-10  Units into the skin 2 (two) times daily.    Yes Historical Provider, MD  labetalol (NORMODYNE) 300 MG tablet Take 300 mg by mouth 3 (three) times daily.    Yes Historical Provider, MD  memantine (NAMENDA) 5 MG tablet Take 5 mg by mouth daily. Take 1 tablet daily 12/23/14  Yes Historical Provider, MD  omeprazole (PRILOSEC) 20 MG capsule Take 20 mg by mouth daily.     Yes Historical Provider, MD  risperiDONE (RISPERDAL) 0.5 MG tablet Take 2 tablets at bedtime 01/13/15  Yes Historical Provider, MD   BP 143/59 mmHg  Pulse 78  Temp(Src) 98.8 F (37.1 C) (Oral)  Resp 16  SpO2 95% Physical Exam  Constitutional: He appears well-developed and well-nourished. No distress.  HENT:  Head: Normocephalic and atraumatic.  Eyes:  Pupils are equal, round, and reactive to light.  Neck: Normal range of motion. Neck supple.  Cardiovascular: Normal rate and regular rhythm.   Pulmonary/Chest: Effort normal.  Abdominal: Soft.  Musculoskeletal:  Left arm fistula has a positive thrill. Access site is eroded but no acute changes. No induration, erythema, drainage, bleeding or pain to the site.  Neurological: He is alert.  Pt with pleasant demeanor, will follow simple commands.  Skin: Skin is warm and dry.  Nursing note and vitals reviewed.   ED Course  Procedures (including critical care time) Labs Review Labs Reviewed - No data to display  Imaging Review No results found. I have personally reviewed and evaluated these images and lab results as part of my medical decision-making.   EKG Interpretation None      MDM   Final diagnoses:  Encounter for care related to vascular access port    Dr. Dayna Barker has seen the patient as well. Mr. Kimber needs a vascular surgeon to evaluate his fistula ASAP because if the site continues to be accessed much more there is concern it may rupture in time. However, there are no acute findings that warrant emergency surgery or intervention. The patient has been seen and his wifes questions answered. He agrees pt stable for DC with f/u to vascular surgery. Filed Vitals:   10/02/15 1915 10/02/15 2230  BP: 95/47 143/59  Pulse: 92 78  Temp: 98.8 F (37.1 C)   Resp: [address], PA-C 10/02/15 2251  Merrily Pew, MD 10/02/15 2258

## 2015-10-02 NOTE — Discharge Instructions (Signed)
PLEASE CALL THE VASCULAR OFFICE TO ARRANGE APPOINTMENT TO BE SEEN AS SOON AS POSSIBLE.

## 2015-10-02 NOTE — ED Notes (Signed)
Site covered with gauze and paper tape as requested.

## 2015-10-02 NOTE — ED Notes (Signed)
Patient not in room yet 

## 2015-10-02 NOTE — ED Notes (Signed)
Pt sent here from dialysis due to "his fistula not looking good today and needs to be seen by vascular." Pt received full dialysis today. Goes MWF. Denies pain.

## 2016-01-21 ENCOUNTER — Encounter: Payer: Self-pay | Admitting: Gastroenterology

## 2016-02-03 ENCOUNTER — Other Ambulatory Visit: Payer: Self-pay | Admitting: *Deleted

## 2016-02-03 ENCOUNTER — Encounter: Payer: Self-pay | Admitting: Vascular Surgery

## 2016-02-03 ENCOUNTER — Ambulatory Visit (HOSPITAL_COMMUNITY)
Admission: RE | Admit: 2016-02-03 | Discharge: 2016-02-03 | Disposition: A | Discharge status: Home / Self Care | Payer: Medicare Other | Source: Ambulatory Visit | Attending: Vascular Surgery | Admitting: Vascular Surgery

## 2016-02-03 DIAGNOSIS — N186 End stage renal disease: Secondary | ICD-10-CM | POA: Diagnosis not present

## 2016-02-03 DIAGNOSIS — T82510D Breakdown (mechanical) of surgically created arteriovenous fistula, subsequent encounter: Secondary | ICD-10-CM

## 2016-02-10 ENCOUNTER — Encounter: Payer: Self-pay | Admitting: *Deleted

## 2016-02-10 ENCOUNTER — Other Ambulatory Visit: Payer: Self-pay | Admitting: *Deleted

## 2016-02-10 ENCOUNTER — Encounter: Payer: Self-pay | Admitting: Vascular Surgery

## 2016-02-10 ENCOUNTER — Ambulatory Visit (INDEPENDENT_AMBULATORY_CARE_PROVIDER_SITE_OTHER): Payer: Medicare Other | Admitting: Vascular Surgery

## 2016-02-10 VITALS — BP 133/70 | HR 76 | Temp 98.5°F | Ht 62.0 in | Wt 149.2 lb

## 2016-02-10 DIAGNOSIS — N185 Chronic kidney disease, stage 5: Secondary | ICD-10-CM

## 2016-02-10 NOTE — Progress Notes (Signed)
Patient name: Steven Forbes MRN: [id number] DOB: 22-Feb-1926 Sex: male  REASON FOR VISIT: Evaluate for plication of aneurysms of left upper arm fistula. Referred by Dr. Marval Regal.   HPI: Steven Forbes is a 82 y.o. male who I last saw in November 2016. 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 In March [id number] he had plication of the largest aneurysm. He was then lost to follow up. He was sent back in November because the dialysis center was concerned about some ulcerations over the fistula. This was improving and therefore we elected to follow this. The ulceration healed. He was sent back for concerns about thinning over the aneurysm.   The patient has some underlying dementia. He denies any problems with his fistula. He denies any bleeding problems with dialysis.  Current Outpatient Prescriptions  Medication Sig Dispense Refill  . aspirin 81 MG EC tablet TAKE 1 TABLET (81 MG TOTAL) BY MOUTH DAILY. 30 tablet 11  . atorvastatin (LIPITOR) 80 MG tablet Take 80 mg by mouth daily.     . darbepoetin (ARANESP) 100 MCG/0.5ML SOLN injection Inject 0.5 mLs (100 mcg total) into the vein every Monday with hemodialysis. 4.2 mL   . Insulin Isophane Human (HUMULIN N PEN [place]) Inject 5-10 Units into the skin 2 (two) times daily.     [name] labetalol (NORMODYNE) 300 MG tablet Take 300 mg by mouth 3 (three) times daily.     . memantine (NAMENDA) 5 MG tablet Take 5 mg by mouth daily. Take 1 tablet daily  3  . omeprazole (PRILOSEC) 20 MG capsule Take 20 mg by mouth daily.      . risperiDONE (RISPERDAL) 0.5 MG tablet Take 2 tablets at bedtime    . amLODipine (NORVASC) 5 MG tablet Take 5 mg by mouth daily. Reported on 02/10/2016     No current facility-administered medications for this visit.    REVIEW OF SYSTEMS:  [X]  denotes positive finding, [ ]  denotes negative finding Cardiac  Comments:  Chest pain or chest pressure:    Shortness of breath upon exertion:    Short of breath when lying flat:      Irregular heart rhythm:    Constitutional    Fever or chills:      PHYSICAL EXAM: Filed Vitals:   02/10/16 1508 02/10/16 1511  BP: 142/68 133/70  Pulse: 76   Temp: 98.5 F (36.9 C)   TempSrc: Oral   Height: 5\' 2"  (1.575 m)   Weight: 149 lb 3.2 oz (67.677 kg)   SpO2: 98%     GENERAL: The patient is a well-nourished male, in no acute distress. The vital signs are documented above. CARDIOVASCULAR: There is a regular rate and rhythm. PULMONARY: There is good air exchange bilaterally without wheezing or rales. He has a large aneurysmal left upper arm fistula. There are 2 small eschars overlying to the aneurysms. There is no erythema or drainage to suggest infection. He has a diminished, but palpable left radial pulse.  MEDICAL ISSUES:  END-STAGE RENAL DISEASE: Given the risk of bleeding I would recommend local excision of the 2 eschars and simply closing the skin over the fistula. This should leave plenty of room for still cannulating the fistula and keep things as simple as possible. Plication of the aneurysms would be a more involved and the patient's wife is very concerned about him having any surgery at all because of his age. This is why I would favor the simplest approach. He dialyzes on Monday  Wednesdays and Fridays. Given the risk of bleeding will try to arrange this for Tuesday or Thursday next week.  Deitra Mayo [facility] [place] [phone]

## 2016-02-17 ENCOUNTER — Encounter (HOSPITAL_COMMUNITY): Payer: Self-pay | Admitting: *Deleted

## 2016-02-17 MED ORDER — DEXTROSE 5 % IV SOLN
1.5000 g | INTRAVENOUS | Status: AC
  Administered 2016-02-18: 1.5 g via INTRAVENOUS
  Filled 2016-02-17: qty 1.5

## 2016-02-17 NOTE — Progress Notes (Signed)
Spoke to pt's wife, Mickel Baas for pre-op call. Pt has hx of dementia. Mrs. Jovanovic states pt has not had insulin for 4 weeks due to sugar being low to normal. She states Dr. Virgina Jock is aware of this. States his fasting blood sugar is usually around 72-93. Last A1C was 4 months ago and was 6.something per wife. She denies any chest pain recently for pt. Last OV with Dr. Stanford Breed was 07/08/14, Mrs. Cruze states it's too difficult to get him to office visits now. States Dr. Virgina Jock is checking pt regularly. She also states BP is lower than it has been in past and he's not had his Amlodipine for several weeks, she states Dr. Virgina Jock is also aware of this.

## 2016-02-17 NOTE — Anesthesia Preprocedure Evaluation (Addendum)
Anesthesia Evaluation  Patient identified by MRN, date of birth, ID band Patient awake    Reviewed: Allergy & Precautions, NPO status , Patient's Chart, lab work & pertinent test results  Airway Mallampati: II       Dental  (+) Upper Dentures, Edentulous Lower   Pulmonary shortness of breath and with exertion, asthma , pneumonia, resolved,    breath sounds clear to auscultation       Cardiovascular Exercise Tolerance: Poor hypertension, Pt. on medications and Pt. on home beta blockers + CAD, + Past MI, + Peripheral Vascular Disease and +CHF  + Valvular Problems/Murmurs AS  Rhythm:Regular Rate:Normal   Echocardiogram (09/10/2013): EF 50-55%, normal wall motion, grade 2 diastolic dysfunction, mild aortic stenosis, trivial AI, MAC, moderate MR, mild LAE, moderate to severe TR, moderately increased PASP.  NSTEMI in setting of influenza, volume overload 08/2013 => Lexiscan Myoview (09/11/2013): Subtle ischemia in the anteroseptal wall, EF 44%. => med Rx recommended   Neuro/Psych PSYCHIATRIC DISORDERS  Dementia  Very HOHnegative neurological ROS     GI/Hepatic Neg liver ROS, GERD  Medicated and Controlled,  Endo/Other  diabetes, Type 2  Renal/GU Renal Insufficiency, Dialysis and ESRFRenal disease Dialysis on MONDAY, [facility] and FRIDAYS  negative genitourinary   Musculoskeletal   Abdominal   Peds negative pediatric ROS (+)  Hematology  (+) anemia ,   Anesthesia Other Findings   Reproductive/Obstetrics                             BP Readings from Last 3 Encounters:  02/10/16 133/70  10/02/15 141/58  07/29/15 107/68   Lab Results  Component Value Date   WBC 14.3* 09/13/2013   HGB 13.6 11/21/2013   HCT 40.0 11/21/2013   MCV 90.3 09/13/2013   PLT 193 09/13/2013     Chemistry      Component Value Date/Time   NA 140 11/21/2013 0904   K 2.8* 11/21/2013 0904   CL 97 09/13/2013 0810   CO2  27 09/13/2013 0810   BUN 37* 09/13/2013 0810   CREATININE 4.44* 09/13/2013 0810      Component Value Date/Time   CALCIUM 8.1* 09/13/2013 0810   CALCIUM 8.4 02/26/2009 1145   ALKPHOS 400* 07/08/2014 1150   AST 38* 07/08/2014 1150   ALT 33 07/08/2014 1150   BILITOT 0.7 07/08/2014 1150     Lab Results  Component Value Date   INR 1.24 09/09/2013   INR 1.01 07/19/2011   INR 0.96 05/26/2011   Lab Results  Component Value Date   HGBA1C 6.2* 09/09/2013    Anesthesia Physical Anesthesia Plan  ASA: III  Anesthesia Plan: MAC   Post-op Pain Management:    Induction: Intravenous  Airway Management Planned: Natural Airway and Simple Face Mask  Additional Equipment:   Intra-op Plan:   Post-operative Plan:   Informed Consent: I have reviewed the patients History and Physical, chart, labs and discussed the procedure including the risks, benefits and alternatives for the proposed anesthesia with the patient or authorized representative who has indicated his/her understanding and acceptance.     Plan Discussed with:   Anesthesia Plan Comments:         Anesthesia Quick Evaluation

## 2016-02-18 ENCOUNTER — Ambulatory Visit (HOSPITAL_COMMUNITY)
Admission: RE | Admit: 2016-02-18 | Discharge: 2016-02-18 | Disposition: A | Discharge status: Home / Self Care | Payer: Medicare Other | Source: Ambulatory Visit | Attending: Vascular Surgery | Admitting: Vascular Surgery

## 2016-02-18 ENCOUNTER — Encounter (HOSPITAL_COMMUNITY)
Admission: RE | Disposition: A | Discharge status: Home / Self Care | Payer: Self-pay | Source: Ambulatory Visit | Attending: Vascular Surgery

## 2016-02-18 ENCOUNTER — Ambulatory Visit (HOSPITAL_COMMUNITY): Payer: Medicare Other | Admitting: Certified Registered Nurse Anesthetist

## 2016-02-18 ENCOUNTER — Telehealth: Payer: Self-pay | Admitting: Vascular Surgery

## 2016-02-18 ENCOUNTER — Encounter (HOSPITAL_COMMUNITY): Payer: Self-pay | Admitting: *Deleted

## 2016-02-18 DIAGNOSIS — K219 Gastro-esophageal reflux disease without esophagitis: Secondary | ICD-10-CM | POA: Insufficient documentation

## 2016-02-18 DIAGNOSIS — N186 End stage renal disease: Secondary | ICD-10-CM | POA: Diagnosis not present

## 2016-02-18 DIAGNOSIS — E1122 Type 2 diabetes mellitus with diabetic chronic kidney disease: Secondary | ICD-10-CM | POA: Insufficient documentation

## 2016-02-18 DIAGNOSIS — T82898A Other specified complication of vascular prosthetic devices, implants and grafts, initial encounter: Secondary | ICD-10-CM | POA: Diagnosis not present

## 2016-02-18 DIAGNOSIS — I252 Old myocardial infarction: Secondary | ICD-10-CM | POA: Diagnosis not present

## 2016-02-18 DIAGNOSIS — Z992 Dependence on renal dialysis: Secondary | ICD-10-CM | POA: Insufficient documentation

## 2016-02-18 DIAGNOSIS — I509 Heart failure, unspecified: Secondary | ICD-10-CM | POA: Insufficient documentation

## 2016-02-18 DIAGNOSIS — Z7982 Long term (current) use of aspirin: Secondary | ICD-10-CM | POA: Insufficient documentation

## 2016-02-18 DIAGNOSIS — I2510 Atherosclerotic heart disease of native coronary artery without angina pectoris: Secondary | ICD-10-CM | POA: Insufficient documentation

## 2016-02-18 DIAGNOSIS — F0390 Unspecified dementia without behavioral disturbance: Secondary | ICD-10-CM | POA: Insufficient documentation

## 2016-02-18 DIAGNOSIS — Y832 Surgical operation with anastomosis, bypass or graft as the cause of abnormal reaction of the patient, or of later complication, without mention of misadventure at the time of the procedure: Secondary | ICD-10-CM | POA: Diagnosis not present

## 2016-02-18 DIAGNOSIS — I132 Hypertensive heart and chronic kidney disease with heart failure and with stage 5 chronic kidney disease, or end stage renal disease: Secondary | ICD-10-CM | POA: Diagnosis not present

## 2016-02-18 HISTORY — DX: Gastro-esophageal reflux disease without esophagitis: K21.9

## 2016-02-18 HISTORY — DX: Unspecified dementia, unspecified severity, without behavioral disturbance, psychotic disturbance, mood disturbance, and anxiety: F03.90

## 2016-02-18 HISTORY — PX: REVISON OF ARTERIOVENOUS FISTULA: SHX6074

## 2016-02-18 LAB — GLUCOSE, CAPILLARY: GLUCOSE-CAPILLARY: 123 mg/dL — AB (ref 65–99)

## 2016-02-18 SURGERY — REVISON OF ARTERIOVENOUS FISTULA
Anesthesia: Monitor Anesthesia Care | Site: Arm Upper | Laterality: Left

## 2016-02-18 MED ORDER — MEPERIDINE HCL 25 MG/ML IJ SOLN: 6.2500 mg | INTRAMUSCULAR | Status: DC | PRN

## 2016-02-18 MED ORDER — SODIUM CHLORIDE 0.9 % IV SOLN: INTRAVENOUS | Status: DC

## 2016-02-18 MED ORDER — LIDOCAINE HCL (CARDIAC) 20 MG/ML IV SOLN
INTRAVENOUS | Status: DC | PRN
  Administered 2016-02-18: 60 mg via INTRAVENOUS

## 2016-02-18 MED ORDER — CHLORHEXIDINE GLUCONATE CLOTH 2 % EX PADS: 6.0000 | MEDICATED_PAD | Freq: Once | CUTANEOUS | Status: DC

## 2016-02-18 MED ORDER — 0.9 % SODIUM CHLORIDE (POUR BTL) OPTIME
TOPICAL | Status: DC | PRN
  Administered 2016-02-18: 1000 mL

## 2016-02-18 MED ORDER — LIDOCAINE-EPINEPHRINE (PF) 1 %-1:200000 IJ SOLN
INTRAMUSCULAR | Status: AC
  Filled 2016-02-18: qty 30

## 2016-02-18 MED ORDER — FENTANYL CITRATE (PF) 250 MCG/5ML IJ SOLN
INTRAMUSCULAR | Status: AC
  Filled 2016-02-18: qty 5

## 2016-02-18 MED ORDER — LACTATED RINGERS IV SOLN: INTRAVENOUS | Status: DC

## 2016-02-18 MED ORDER — LIDOCAINE-EPINEPHRINE (PF) 1 %-1:200000 IJ SOLN
INTRAMUSCULAR | Status: DC | PRN
  Administered 2016-02-18: 29 mL

## 2016-02-18 MED ORDER — FENTANYL CITRATE (PF) 100 MCG/2ML IJ SOLN: 25.0000 ug | INTRAMUSCULAR | Status: DC | PRN

## 2016-02-18 MED ORDER — SODIUM CHLORIDE 0.9 % IV SOLN
INTRAVENOUS | Status: DC | PRN
  Administered 2016-02-18: 500 mL

## 2016-02-18 MED ORDER — PROPOFOL 500 MG/50ML IV EMUL
INTRAVENOUS | Status: DC | PRN
  Administered 2016-02-18: 50 ug/kg/min via INTRAVENOUS

## 2016-02-18 MED ORDER — ONDANSETRON HCL 4 MG/2ML IJ SOLN
INTRAMUSCULAR | Status: DC | PRN
  Administered 2016-02-18: 4 mg via INTRAVENOUS

## 2016-02-18 MED ORDER — LIDOCAINE 2% (20 MG/ML) 5 ML SYRINGE
INTRAMUSCULAR | Status: AC
  Filled 2016-02-18: qty 5

## 2016-02-18 MED ORDER — SODIUM CHLORIDE 0.9 % IV SOLN
INTRAVENOUS | Status: DC
  Administered 2016-02-18: 07:00:00 via INTRAVENOUS

## 2016-02-18 MED ORDER — OXYCODONE-ACETAMINOPHEN 5-325 MG PO TABS: 1.0000 | ORAL_TABLET | Freq: Three times a day (TID) | ORAL | Status: DC | PRN

## 2016-02-18 MED ORDER — ONDANSETRON HCL 4 MG/2ML IJ SOLN
INTRAMUSCULAR | Status: AC
  Filled 2016-02-18: qty 2

## 2016-02-18 SURGICAL SUPPLY — 35 items
BANDAGE ELASTIC 4 VELCRO ST LF (GAUZE/BANDAGES/DRESSINGS) ×2 IMPLANT
BLADE SURG 15 STRL LF DISP TIS (BLADE) IMPLANT
BLADE SURG 15 STRL SS (BLADE) ×6
BNDG GAUZE ELAST 4 BULKY (GAUZE/BANDAGES/DRESSINGS) ×2 IMPLANT
CANISTER SUCTION 2500CC (MISCELLANEOUS) ×3 IMPLANT
CLIP TI MEDIUM 6 (CLIP) ×3 IMPLANT
CLIP TI WIDE RED SMALL 6 (CLIP) ×3 IMPLANT
COVER PROBE W GEL 5X96 (DRAPES) IMPLANT
ELECT REM PT RETURN 9FT ADLT (ELECTROSURGICAL) ×3
ELECTRODE REM PT RTRN 9FT ADLT (ELECTROSURGICAL) ×1 IMPLANT
GAUZE SPONGE 4X4 12PLY STRL (GAUZE/BANDAGES/DRESSINGS) ×3 IMPLANT
GEL ULTRASOUND 20GR AQUASONIC (MISCELLANEOUS) IMPLANT
GLOVE BIO SURGEON STRL SZ 6.5 (GLOVE) ×2 IMPLANT
GLOVE BIO SURGEONS STRL SZ 6.5 (GLOVE) ×2
GLOVE BIOGEL PI IND STRL 6.5 (GLOVE) IMPLANT
GLOVE BIOGEL PI INDICATOR 6.5 (GLOVE) ×4
GLOVE ECLIPSE 6.5 STRL STRAW (GLOVE) ×2 IMPLANT
GLOVE SS BIOGEL STRL SZ 7 (GLOVE) ×1 IMPLANT
GLOVE SUPERSENSE BIOGEL SZ 7 (GLOVE) ×2
GOWN STRL REUS W/ TWL LRG LVL3 (GOWN DISPOSABLE) ×3 IMPLANT
GOWN STRL REUS W/TWL LRG LVL3 (GOWN DISPOSABLE) ×9
KIT BASIN OR (CUSTOM PROCEDURE TRAY) ×3 IMPLANT
KIT ROOM TURNOVER OR (KITS) ×3 IMPLANT
LIQUID BAND (GAUZE/BANDAGES/DRESSINGS) ×3 IMPLANT
NS IRRIG 1000ML POUR BTL (IV SOLUTION) ×3 IMPLANT
PACK CV ACCESS (CUSTOM PROCEDURE TRAY) ×3 IMPLANT
PAD ARMBOARD 7.5X6 YLW CONV (MISCELLANEOUS) ×6 IMPLANT
SUT ETHILON 3 0 PS 1 (SUTURE) ×8 IMPLANT
SUT PROLENE 6 0 BV (SUTURE) ×3 IMPLANT
SUT PROLENE 6 0 C 1 24 (SUTURE) ×4 IMPLANT
SUT VIC AB 3-0 SH 18 (SUTURE) ×4 IMPLANT
SUT VIC AB 3-0 SH 27 (SUTURE) ×3
SUT VIC AB 3-0 SH 27X BRD (SUTURE) ×1 IMPLANT
UNDERPAD 30X30 INCONTINENT (UNDERPADS AND DIAPERS) ×3 IMPLANT
WATER STERILE IRR 1000ML POUR (IV SOLUTION) ×3 IMPLANT

## 2016-02-18 NOTE — Transfer of Care (Signed)
Immediate Anesthesia Transfer of Care Note  Patient: Steven Forbes  Procedure(s) Performed: Procedure(s): excision of ulcerated skin over left brachio-cephalic AV fistula x 2 (Left)  Patient Location: PACU  Anesthesia Type:MAC  Level of Consciousness: awake and alert   Airway & Oxygen Therapy: Patient Spontanous Breathing  Post-op Assessment: Report given to RN and Post -op Vital signs reviewed and stable  Post vital signs: Reviewed and stable  Last Vitals:  Filed Vitals:   02/18/16 0714  BP: 158/59  Pulse: 74  Temp: 36.9 C  Resp: 18    Last Pain: There were no vitals filed for this visit.       Complications: No apparent anesthesia complications

## 2016-02-18 NOTE — Anesthesia Procedure Notes (Addendum)
Procedure Name: MAC Date/Time: 02/18/2016 10:14 AM Performed by: Rejeana Brock L Pre-anesthesia Checklist: Patient identified, Timeout performed, Emergency Drugs available, Suction available and Patient being monitored Patient Re-evaluated:Patient Re-evaluated prior to inductionOxygen Delivery Method: Simple face mask Preoxygenation: Pre-oxygenation with 100% oxygen Intubation Type: IV induction

## 2016-02-18 NOTE — Telephone Encounter (Signed)
-----   Message from Mena Goes, RN sent at 02/18/2016 12:48 PM EDT ----- Regarding: schedule   ----- Message -----    From: Gabriel Earing, PA-C    Sent: 02/18/2016  11:12 AM      To: Vvs Charge Pool  S/P excision of ulcerated skin over left BC AVF x 2 02/18/16.  F/u with JDL in 4 weeks.  Pt will need suture removal at that time as well.  Thanks, Aldona Bar

## 2016-02-18 NOTE — Interval H&P Note (Signed)
History and Physical Interval Note:  02/18/2016 9:49 AM  Steven Forbes  has presented today for surgery, with the diagnosis of Mechanical complication of arteriovenous fistula  The various methods of treatment have been discussed with the patient and family. After consideration of risks, benefits and other options for treatment, the patient has consented to  Procedure(s): REVISION OF ARTERIOVENOUS FISTULA (Left) as a surgical intervention .  The patient's history has been reviewed, patient examined, no change in status, stable for surgery.  I have reviewed the patient's chart and labs.  Questions were answered to the patient's satisfaction.     Tinnie Gens

## 2016-02-18 NOTE — Op Note (Signed)
OPERATIVE REPORT  Date of Surgery: 02/18/2016  Surgeon: Tinnie Gens, MD  Assistant: Leontine Locket PA  Pre-op Diagnosis: Eschar overlying aneurysmal left brachial-cephalic AV fistula 2  Post-op Diagnosis: eschar overlying aneurysmal left AV fistula x 2  Procedure: Procedure(s): excision of ulcerated skin over left brachio-cephalic AV fistula x 2  Anesthesia: Mac  EBL: Minimal  Complications: None  Procedure Details: The patient was taken the operating room placed in supine position at which time the left upper extremity was prepped Betadine scrub and solution draped in routine sterile manner. There was a functioning left brachial-cephalic AV fistula which was diffusely aneurysmal. There were 2 ulcerated areas 1 in the mid upper arm and one in the distal upper arm overlying this aneurysmal fistula. After infiltration of 1% Xylocaine with epinephrine a elliptical incisions were made surrounding these ulcerated areas. These elliptical incisions were about 4-5 cm in length and about 2-1/2 cm in diameter. It was carried down to the anterior aspect of the aneurysmal fistula. Skin was mobilized medially and laterally area both of these areas were treated in identical fashion. After this had been accomplished and the entire area of ulcerated skin been excised the wounds were closed with interrupted 3-0 Vicryl and interrupted 3-0 nylon vertical mattress sutures sterile dressing applied consisting of 4 x 4's Kerlix and Ace wrap patient taken to the recovery room in satisfactory condition   Tinnie Gens, MD 02/18/2016 11:08 AM

## 2016-02-18 NOTE — Telephone Encounter (Signed)
Sched appt 6/27 at 8:45. Spoke to pt's wife to inform them of appt.

## 2016-02-18 NOTE — Anesthesia Postprocedure Evaluation (Signed)
Anesthesia Post Note  Patient: Steven Forbes  Procedure(s) Performed: Procedure(s) (LRB): excision of ulcerated skin over left brachio-cephalic AV fistula x 2 (Left)  Patient location during evaluation: PACU Anesthesia Type: MAC Level of consciousness: awake and alert Pain management: pain level controlled Vital Signs Assessment: post-procedure vital signs reviewed and stable Respiratory status: spontaneous breathing, nonlabored ventilation, respiratory function stable and patient connected to nasal cannula oxygen Cardiovascular status: stable and blood pressure returned to baseline Anesthetic complications: no    Last Vitals:  Filed Vitals:   02/18/16 1202 02/18/16 1215  BP: 142/62   Pulse: 70 68  Temp:    Resp:      Last Pain:  Filed Vitals:   02/18/16 1232  PainSc: 0-No pain                 Effie Berkshire

## 2016-02-18 NOTE — Discharge Instructions (Signed)
° ° °  02/18/2016 Zollie Pee [id number] [birth year]  Surgeon(s): [name] Misty, MD  Procedure(s): excision of ulcerated skin over left brachio-cephalic AV fistula x 2   X May stick graft on designated area only:  Do NOT stick over incisions x 6 weeks. May stick above & below incisions now. SEE DIAGRAM.

## 2016-02-18 NOTE — H&P (View-Only) (Signed)
Patient name: Steven Forbes MRN: [id number] DOB: 22-Feb-1926 Sex: male  REASON FOR VISIT: Evaluate for plication of aneurysms of left upper arm fistula. Referred by Dr. Marval Regal.   HPI: Steven Forbes is a 82 y.o. male who I last saw in November 2016. 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 In March [id number] he had plication of the largest aneurysm. He was then lost to follow up. He was sent back in November because the dialysis center was concerned about some ulcerations over the fistula. This was improving and therefore we elected to follow this. The ulceration healed. He was sent back for concerns about thinning over the aneurysm.   The patient has some underlying dementia. He denies any problems with his fistula. He denies any bleeding problems with dialysis.  Current Outpatient Prescriptions  Medication Sig Dispense Refill  . aspirin 81 MG EC tablet TAKE 1 TABLET (81 MG TOTAL) BY MOUTH DAILY. 30 tablet 11  . atorvastatin (LIPITOR) 80 MG tablet Take 80 mg by mouth daily.     . darbepoetin (ARANESP) 100 MCG/0.5ML SOLN injection Inject 0.5 mLs (100 mcg total) into the vein every Monday with hemodialysis. 4.2 mL   . Insulin Isophane Human (HUMULIN N PEN [place]) Inject 5-10 Units into the skin 2 (two) times daily.     [name] labetalol (NORMODYNE) 300 MG tablet Take 300 mg by mouth 3 (three) times daily.     . memantine (NAMENDA) 5 MG tablet Take 5 mg by mouth daily. Take 1 tablet daily  3  . omeprazole (PRILOSEC) 20 MG capsule Take 20 mg by mouth daily.      . risperiDONE (RISPERDAL) 0.5 MG tablet Take 2 tablets at bedtime    . amLODipine (NORVASC) 5 MG tablet Take 5 mg by mouth daily. Reported on 02/10/2016     No current facility-administered medications for this visit.    REVIEW OF SYSTEMS:  [X]  denotes positive finding, [ ]  denotes negative finding Cardiac  Comments:  Chest pain or chest pressure:    Shortness of breath upon exertion:    Short of breath when lying flat:      Irregular heart rhythm:    Constitutional    Fever or chills:      PHYSICAL EXAM: Filed Vitals:   02/10/16 1508 02/10/16 1511  BP: 142/68 133/70  Pulse: 76   Temp: 98.5 F (36.9 C)   TempSrc: Oral   Height: 5\' 2"  (1.575 m)   Weight: 149 lb 3.2 oz (67.677 kg)   SpO2: 98%     GENERAL: The patient is a well-nourished male, in no acute distress. The vital signs are documented above. CARDIOVASCULAR: There is a regular rate and rhythm. PULMONARY: There is good air exchange bilaterally without wheezing or rales. He has a large aneurysmal left upper arm fistula. There are 2 small eschars overlying to the aneurysms. There is no erythema or drainage to suggest infection. He has a diminished, but palpable left radial pulse.  MEDICAL ISSUES:  END-STAGE RENAL DISEASE: Given the risk of bleeding I would recommend local excision of the 2 eschars and simply closing the skin over the fistula. This should leave plenty of room for still cannulating the fistula and keep things as simple as possible. Plication of the aneurysms would be a more involved and the patient's wife is very concerned about him having any surgery at all because of his age. This is why I would favor the simplest approach. He dialyzes on Monday  Wednesdays and Fridays. Given the risk of bleeding will try to arrange this for Tuesday or Thursday next week.  Deitra Mayo [facility] [place] [phone]

## 2016-02-19 ENCOUNTER — Encounter (HOSPITAL_COMMUNITY): Payer: Self-pay | Admitting: Vascular Surgery

## 2016-03-09 ENCOUNTER — Encounter: Payer: Self-pay | Admitting: Vascular Surgery

## 2016-03-15 ENCOUNTER — Encounter: Payer: Self-pay | Admitting: Vascular Surgery

## 2016-03-15 ENCOUNTER — Ambulatory Visit (INDEPENDENT_AMBULATORY_CARE_PROVIDER_SITE_OTHER): Payer: Self-pay | Admitting: Vascular Surgery

## 2016-03-15 VITALS — BP 158/72 | HR 82 | Temp 97.2°F | Resp 16 | Ht 65.0 in | Wt 144.0 lb

## 2016-03-15 DIAGNOSIS — Z992 Dependence on renal dialysis: Secondary | ICD-10-CM

## 2016-03-15 DIAGNOSIS — N186 End stage renal disease: Secondary | ICD-10-CM

## 2016-03-15 NOTE — Progress Notes (Signed)
Subjective:     Patient ID: Steven Forbes, male   DOB: 1926/07/15, 82 y.o.   MRN: [id number]  HPI male returns for follow-up regarding his surgical procedure on June 1 by me. Weeks-sized ulcerated skin over 2 areas of the left brachial-cephalic AV fistula which has diffuse aneurysmal changes. He has been receiving dialysis in the fistula since the surgery without difficulty. He denies any pain or numbness in the left hand. He's had no drainage from the incision sites.  Review of Systems     Objective:   Physical Exam BP 158/72 mmHg  Pulse 82  Temp(Src) 97.2 F (36.2 C)  Resp 16  Ht 5\' 5"  (1.651 m)  Wt 144 lb (65.318 kg)  BMI 23.96 kg/m2  SpO2 100%  Gen. elderly male no apparent distress alert and oriented 3 Left upper extremity brachial-cephalic AV fistula with diffuse aneurysmal dilatation throughout. Surgical sites overlying the aneurysmal changes were the ulcerated skin was excised or healing nicely. Nylon sutures were removed. 2+ radial pulse palpable distally.     Assessment:     Doing well post excision of ulcerated skin overlying left brachial-cephalic AV fistula Fistula being utilized without difficulty Diffuse aneurysmal dilatation of fistula    Plan:     Return to see [place] on when necessary basis

## 2016-03-15 NOTE — Progress Notes (Signed)
Filed Vitals:   03/15/16 0840 03/15/16 0843  BP: 160/69 158/72  Pulse: 82 82  Temp: 97.2 F (36.2 C)   Resp: 16   Height: 5\' 5"  (1.651 m)   Weight: 144 lb (65.318 kg)   SpO2: 100%

## 2016-08-17 ENCOUNTER — Encounter: Payer: Self-pay | Admitting: Vascular Surgery

## 2016-08-23 ENCOUNTER — Ambulatory Visit (INDEPENDENT_AMBULATORY_CARE_PROVIDER_SITE_OTHER): Payer: Medicare Other | Admitting: Vascular Surgery

## 2016-08-23 ENCOUNTER — Encounter: Payer: Self-pay | Admitting: Vascular Surgery

## 2016-08-23 VITALS — BP 100/56 | HR 74 | Temp 98.0°F | Resp 18 | Ht 63.0 in | Wt 143.8 lb

## 2016-08-23 DIAGNOSIS — Z992 Dependence on renal dialysis: Secondary | ICD-10-CM

## 2016-08-23 DIAGNOSIS — N186 End stage renal disease: Secondary | ICD-10-CM | POA: Diagnosis not present

## 2016-08-23 NOTE — Progress Notes (Signed)
Vascular and Vein Specialist of [facility]  Patient name: Steven Forbes MRN: [id number] DOB: 14-Dec-1925 Sex: male  REASON FOR VISIT: Evaluation of left brachiocephalic AV fistula  HPI: Steven Forbes is a 82 y.o. male here today for evaluation of left brachiocephalic fistula. An 82 year old gentleman with long-standing history of end-stage renal disease. He did undergo revision of the 2 areas of ulceration by Dr. Kellie Simmering on 02/18/2016. He was sent over here to have his fistula evaluated. He was here with his wife and daughter.  Past Medical History:  Diagnosis Date  . Anemia   . Asthma   . Dementia   . Diabetes mellitus    type 2  . ESRD (end stage renal disease) ([place])    Dialysis on MONDAY, [place] and FRIDAYS  . Flu 08/2014  . GERD (gastroesophageal reflux disease)   . History of blood transfusion   . HOH (hard of hearing)   . Hx of echocardiogram    a.  Echocardiogram (09/10/2013): EF 50-55%, normal wall motion, grade 2 diastolic dysfunction, mild aortic stenosis, trivial AI, MAC, moderate MR, mild LAE, moderate to severe TR, moderately increased PASP.  [name] Hx of non-ST elevation myocardial infarction (NSTEMI)    a. in setting of influenza, volume overload 08/2013 => Lexiscan Myoview (09/11/2013): Subtle ischemia in the anteroseptal wall, EF 44%. => med Rx recommended  . Hyperlipidemia   . Hypertension   . Shortness of breath    with exertion    Family History  Problem Relation Age of Onset  . Cancer Mother   . Hypertension Mother   . Cancer Father   . Diabetes Father   . Hypertension Father   . Diabetes Son   . Diabetes Daughter   . Diabetes Daughter   . Diabetes Cousin     SOCIAL HISTORY: Social History  Substance Use Topics  . Smoking status: Never Smoker  . Smokeless tobacco: Never Used  . Alcohol use No    No Known Allergies  Current Outpatient Prescriptions  Medication Sig Dispense Refill  . aspirin 81 MG EC tablet  TAKE 1 TABLET (81 MG TOTAL) BY MOUTH DAILY. 30 tablet 11  . atorvastatin (LIPITOR) 80 MG tablet Take 80 mg by mouth daily.     . darbepoetin (ARANESP) 100 MCG/0.5ML SOLN injection Inject 0.5 mLs (100 mcg total) into the vein every Monday with hemodialysis. 4.2 mL   . labetalol (NORMODYNE) 300 MG tablet Take 300 mg by mouth 3 (three) times daily.     . memantine (NAMENDA) 5 MG tablet Take 5 mg by mouth daily.   3  . omeprazole (PRILOSEC) 20 MG capsule Take 20 mg by mouth daily.      [name] PROAIR HFA 108 (90 Base) MCG/ACT inhaler Inhale 2 puffs into the lungs 3 (three) times daily.     . risperiDONE (RISPERDAL) 0.5 MG tablet Take 1 mg by mouth at bedtime.     [name] amLODipine (NORVASC) 5 MG tablet Take 5 mg by mouth daily. Reported on 03/15/2016    . Insulin Isophane Human (HUMULIN N PEN [place]) Inject 5-10 Units into the skin 2 (two) times daily.     [name] oxyCODONE-acetaminophen (ROXICET) 5-325 MG tablet Take 1 tablet by mouth every 8 (eight) hours as needed for moderate pain or severe pain. (Patient not taking: Reported on 08/23/2016) 6 tablet 0   No current facility-administered medications for this visit.     REVIEW OF SYSTEMS:  [X]  denotes positive finding, [ ]  denotes negative finding  Cardiac  Comments:  Chest pain or chest pressure:    Shortness of breath upon exertion:    Short of breath when lying flat:    Irregular heart rhythm:        Vascular    Pain in calf, thigh, or hip brought on by ambulation:    Pain in feet at night that wakes you up from your sleep:     Blood clot in your veins:    Leg swelling:           PHYSICAL EXAM: Vitals:   08/23/16 1310  BP: (!) 100/56  Pulse: 74  Resp: 18  Temp: 98 F (36.7 C)  TempSrc: Oral  SpO2: 99%  Weight: 143 lb 12.8 oz (65.2 kg)  Height: 5\' 3"  (1.6 m)    GENERAL: The patient is a well-nourished male, in no acute distress. The vital signs are documented above. CARDIOVASCULAR: Does have a left upper arm fistula which is well-developed with  an excellent thrill. There is some depigmentation over the areas of access but no evidence of skin breakdown. The skin is completely mobile overlying his fistula. PULMONARY: There is good air exchange  MUSCULOSKELETAL: There are no major deformities or cyanosis. NEUROLOGIC: No focal weakness or paresthesias are detected. SKIN: There are no ulcers or rashes noted. PSYCHIATRIC: The patient has a normal affect.    MEDICAL ISSUES: Had long discussion with the patient and his family. Patient does have severe dementia. I do not see any issues regarding his fistula. Encourage them to continue rotating access sites is much as possible. He will see [place] again on as-needed basis    Rosetta Posner, MD [facility] Vascular and Vein Specialists of [facility] [phone] Pager [phone]  Addendum: When the patient was arising and putting his coat on he did become unsteady and tends slip to the floor with his family present. He did not fall hard. Does not have any apparent injury. Will evaluate him here in the office again prior to discharge from our clinic. Vital signs remained stable. No apparent injury.

## 2016-09-15 ENCOUNTER — Other Ambulatory Visit: Payer: Self-pay | Admitting: Cardiology

## 2016-09-15 ENCOUNTER — Ambulatory Visit (INDEPENDENT_AMBULATORY_CARE_PROVIDER_SITE_OTHER): Payer: Medicare Other | Admitting: Family

## 2016-09-15 ENCOUNTER — Encounter (INDEPENDENT_AMBULATORY_CARE_PROVIDER_SITE_OTHER): Payer: Self-pay | Admitting: Orthopedic Surgery

## 2016-09-15 VITALS — Ht 63.0 in | Wt 142.0 lb

## 2016-09-15 DIAGNOSIS — Z89431 Acquired absence of right foot: Secondary | ICD-10-CM

## 2016-09-15 DIAGNOSIS — E1142 Type 2 diabetes mellitus with diabetic polyneuropathy: Secondary | ICD-10-CM | POA: Diagnosis not present

## 2016-09-15 DIAGNOSIS — E1149 Type 2 diabetes mellitus with other diabetic neurological complication: Secondary | ICD-10-CM | POA: Insufficient documentation

## 2016-09-15 DIAGNOSIS — B351 Tinea unguium: Secondary | ICD-10-CM

## 2016-09-15 NOTE — Telephone Encounter (Signed)
REFILL 

## 2016-09-15 NOTE — Progress Notes (Signed)
Office Visit Note   Patient: Steven Forbes           Date of Birth: 1925/10/19           MRN: [id number] Visit Date: 09/15/2016              Requested by: Shon Baton, MD [place], [place] [address] PCP: Precious Reel, MD   Assessment & Plan: Visit Diagnoses:  1. Status post transmetatarsal amputation of right foot ([place])   2. Type 2 diabetes mellitus with diabetic polyneuropathy, unspecified long term insulin use status ([place])   3. Onychomycosis of toenail     Plan: Calluses were hard and nails were trimmed today. He will continue protective shoe wear. Have provided a order for new extra-depth shoes with custom orthotics to [employer].  Follow-Up Instructions: Return in about 3 months (around 12/14/2016).   Orders:  No orders of the defined types were placed in this encounter.  No orders of the defined types were placed in this encounter.     Procedures: No procedures performed   Clinical Data: No additional findings.   Subjective: Chief Complaint  Patient presents with  . Right Foot - Follow-up    Transmetatarsal amputation right  . Left Foot - Follow-up    3 month follow up    Patient is an 82 year old gentleman who presents for 3 month follow up for bilateral lower extremities. He has hypertrophic callus right transmetatarsal amputation. He is needing a new script for new shoes.     Review of Systems  Constitutional: Negative for chills and fever.     Objective: Vital Signs: Ht 5\' 3"  (1.6 m)   Wt 142 lb (64.4 kg)   BMI 25.15 kg/m   Physical Exam  Constitutional: He is oriented to person, place, and time. He appears well-developed and well-nourished.  Pulmonary/Chest: Effort normal.  Musculoskeletal:  Transmetatarsal amputation right foot. There is hypertrophic callus over the lateral and medial aspects of the amputation. This was pared with a 10 blade knife back to viable tissue. No drainage, erythema or sign of infection.  Thickened and  discolored nails 5 left foot these were trimmed today. Patient is unable to safely trim own nails due to insensate neuropathy.  Neurological: He is alert and oriented to person, place, and time.  Psychiatric: He has a normal mood and affect.  Nursing note reviewed.   Ortho Exam  Specialty Comments:  No specialty comments available.  Imaging: No results found.   PMFS History: Patient Active Problem List   Diagnosis Date Noted  . Diabetes with neurologic complications ([place]) 123456  . Moderate dementia 01/21/2015  . Aortic stenosis 07/08/2014  . CAD (coronary artery disease) 01/07/2014  . Essential hypertension 01/07/2014  . Hyperlipidemia 01/07/2014  . End stage renal disease ([place]) 12/11/2013  . Protein-calorie malnutrition, severe ([place]) 09/11/2013  . Transaminitis 09/10/2013  . Respiratory distress 09/09/2013  . NSTEMI (non-ST elevated myocardial infarction) ([place]) 09/09/2013  . ESRD on dialysis ([place]) 09/09/2013  . possible Pneumonia 09/09/2013  . Influenza B 09/09/2013  . Acute CHF ([place]) 09/09/2013  . Acute myocardial infarction, subendocardial infarction, initial episode of care ([place]) 09/09/2013  . Secondary hyperparathyroidism ([place]) 09/09/2013  . Atherosclerotic peripheral vascular disease ([place]) 09/09/2013  . GASTRIC POLYP 12/08/2008  . HEMORRHOIDS-INTERNAL 12/08/2008  . DIABETES MELLITUS 11/25/2008  . ANEMIA 11/25/2008  . COLONIC POLYPS, HX OF 11/25/2008   Past Medical History:  Diagnosis Date  . Anemia   . Asthma   . Dementia   .  Diabetes mellitus    type 2  . ESRD (end stage renal disease) ([place])    Dialysis on MONDAY, [place] and FRIDAYS  . Flu 08/2014  . GERD (gastroesophageal reflux disease)   . History of blood transfusion   . HOH (hard of hearing)   . Hx of echocardiogram    a.  Echocardiogram (09/10/2013): EF 50-55%, normal wall motion, grade 2 diastolic dysfunction, mild aortic stenosis, trivial AI, MAC, moderate MR, mild LAE, moderate to  severe TR, moderately increased PASP.  [name] Hx of non-ST elevation myocardial infarction (NSTEMI)    a. in setting of influenza, volume overload 08/2013 => Lexiscan Myoview (09/11/2013): Subtle ischemia in the anteroseptal wall, EF 44%. => med Rx recommended  . Hyperlipidemia   . Hypertension   . Shortness of breath    with exertion    Family History  Problem Relation Age of Onset  . Cancer Mother   . Hypertension Mother   . Cancer Father   . Diabetes Father   . Hypertension Father   . Diabetes Son   . Diabetes Daughter   . Diabetes Daughter   . Diabetes Cousin     Past Surgical History:  Procedure Laterality Date  . AV FISTULA PLACEMENT  2008   Left Upper Arm  . COLONOSCOPY    . EYE SURGERY Bilateral    cataract surgery  . LAPAROSCOPIC GASTROTOMY W/ REPAIR OF ULCER    . REVISON OF ARTERIOVENOUS FISTULA Left 11/21/2013   Procedure: REVISION OF ARTERIOVENOUS FISTULA;  Surgeon: Angelia Mould, MD;  Location: [facility];  Service: Vascular;  Laterality: Left;  . REVISON OF ARTERIOVENOUS FISTULA Left 02/18/2016   Procedure: excision of ulcerated skin over left brachio-cephalic AV fistula x 2;  Surgeon: [name] Misty, MD;  Location: [facility];  Service: Vascular;  Laterality: Left;  . TOE AMPUTATION Right    all toes   Social History   Occupational History  . Retired    Social History Main Topics  . Smoking status: Never Smoker  . Smokeless tobacco: Never Used  . Alcohol use No  . Drug use: No  . Sexual activity: Not on file

## 2016-10-13 ENCOUNTER — Other Ambulatory Visit: Payer: Self-pay | Admitting: Cardiology

## 2016-11-08 ENCOUNTER — Other Ambulatory Visit: Payer: Self-pay | Admitting: Cardiology

## 2016-11-26 ENCOUNTER — Other Ambulatory Visit: Payer: Self-pay | Admitting: Cardiology

## 2016-12-01 ENCOUNTER — Encounter: Payer: Self-pay | Admitting: Vascular Surgery

## 2016-12-13 ENCOUNTER — Ambulatory Visit (INDEPENDENT_AMBULATORY_CARE_PROVIDER_SITE_OTHER): Payer: Medicare Other | Admitting: Vascular Surgery

## 2016-12-13 ENCOUNTER — Encounter: Payer: Self-pay | Admitting: Vascular Surgery

## 2016-12-13 VITALS — BP 143/66 | HR 87 | Temp 97.3°F | Resp 20 | Ht 63.0 in | Wt 138.9 lb

## 2016-12-13 DIAGNOSIS — Z992 Dependence on renal dialysis: Secondary | ICD-10-CM | POA: Diagnosis not present

## 2016-12-13 DIAGNOSIS — N186 End stage renal disease: Secondary | ICD-10-CM | POA: Diagnosis not present

## 2016-12-13 NOTE — Progress Notes (Signed)
Vascular and Vein Specialist of [facility]  Patient name: Steven Forbes MRN: [id number] DOB: [birth year] Sex: male  REASON FOR VISIT: Evaluation of aneurysmal change left upper arm AV fistula  HPI: Steven Forbes is a 83 y.o. male here today for follow-up evaluation of his left upper arm AV fistula. He is here today with his wife. He is having a marked progression of his dementia and does note wife and family are having a difficult time keeping him at home. He underwent a recent decl[facility] at [facility]. He is seen today for evaluation of aneurysmal change of this fistula. In reviewing his old chart he's had the this fistula for many years. He has had some aneurysmal change over time and had some skin breakdown over this with the surgical revision of these areas by Dr. Kellie Simmering in June 2017. His wife reports that he is not having any difficulty with his dialysis run.  Past Medical History:  Diagnosis Date  . Anemia   . Asthma   . Dementia   . Diabetes mellitus    type 2  . ESRD (end stage renal disease) ([place])    Dialysis on MONDAY, [place] and FRIDAYS  . Flu 08/2014  . GERD (gastroesophageal reflux disease)   . History of blood transfusion   . HOH (hard of hearing)   . Hx of echocardiogram    a.  Echocardiogram (09/10/2013): EF 50-55%, normal wall motion, grade 2 diastolic dysfunction, mild aortic stenosis, trivial AI, MAC, moderate MR, mild LAE, moderate to severe TR, moderately increased PASP.  [name] Hx of non-ST elevation myocardial infarction (NSTEMI)    a. in setting of influenza, volume overload 08/2013 => Lexiscan Myoview (09/11/2013): Subtle ischemia in the anteroseptal wall, EF 44%. => med Rx recommended  . Hyperlipidemia   . Hypertension   . Shortness of breath    with exertion    Family History  Problem Relation Age of Onset  . Cancer Mother   . Hypertension Mother   . Cancer Father   . Diabetes Father   . Hypertension Father   .  Diabetes Son   . Diabetes Daughter   . Diabetes Daughter   . Diabetes Cousin     SOCIAL HISTORY: Social History  Substance Use Topics  . Smoking status: Never Smoker  . Smokeless tobacco: Never Used  . Alcohol use No    No Known Allergies  Current Outpatient Prescriptions  Medication Sig Dispense Refill  . ACCU-CHEK AVIVA PLUS test strip     . ACCU-CHEK SOFTCLIX LANCETS lancets     . atorvastatin (LIPITOR) 80 MG tablet Take 80 mg by mouth daily.     . calcium acetate (PHOSLO) 667 MG capsule     . CV[facility] ASPIRIN LOW DOSE 81 MG EC tablet TAKE 1 TABLET (81 MG TOTAL) BY MOUTH DAILY. NEED OV. 15 tablet 0  . darbepoetin (ARANESP) 100 MCG/0.5ML SOLN injection Inject 0.5 mLs (100 mcg total) into the vein every Monday with hemodialysis. 4.2 mL   . Insulin Isophane Human (HUMULIN N PEN [place]) Inject 5-10 Units into the skin 2 (two) times daily.     [name] labetalol (NORMODYNE) 300 MG tablet Take 300 mg by mouth 3 (three) times daily.     . memantine (NAMENDA) 5 MG tablet Take 5 mg by mouth daily.   3  . omeprazole (PRILOSEC) 20 MG capsule Take 20 mg by mouth daily.      [name] oxyCODONE-acetaminophen (ROXICET) 5-325 MG tablet Take 1 tablet by  mouth every 8 (eight) hours as needed for moderate pain or severe pain. 6 tablet 0  . PROAIR HFA 108 (90 Base) MCG/ACT inhaler Inhale 2 puffs into the lungs 3 (three) times daily.     . risperiDONE (RISPERDAL) 0.5 MG tablet Take 1 mg by mouth at bedtime.     [name] amLODipine (NORVASC) 5 MG tablet Take 5 mg by mouth daily. Reported on 03/15/2016     No current facility-administered medications for this visit.     REVIEW OF SYSTEMS:  _0  denotes positive finding, _1  denotes negative finding Cardiac  Comments:  Chest pain or chest pressure:    Shortness of breath upon exertion:    Short of breath when lying flat:    Irregular heart rhythm:        Vascular    Pain in calf, thigh, or hip brought on by ambulation:    Pain in feet at night that wakes you up from your  sleep:     Blood clot in your veins:    Leg swelling:           PHYSICAL EXAM: Vitals:   12/13/16 1004  BP: (!) 143/66  Pulse: 87  Resp: 20  Temp: 97.3 F (36.3 C)  TempSrc: Oral  SpO2: 99%  Weight: 138 lb 14.4 oz (63 kg)  Height: _2  (1.6 m)    GENERAL: The patient is a well-nourished male, in no acute distress. The vital signs are documented above. CARDIOVASCULAR: Left upper arm AV fistula with extensive aneurysmal change throughout the fistula. There is some depigmentation but no skin breakdown over the fistula. PULMONARY: There is good air exchange  MUSCULOSKELETAL: There are no major deformities or cyanosis. NEUROLOGIC: No focal weakness or paresthesias are detected. SKIN: There are no ulcers or rashes noted. PSYCHIATRIC: The patient has a normal affect.    MEDICAL ISSUES: Had long discussion with the patient's wife. The patient has severe dementia and really cannot help with the conversation. She wants to avoid surgery if at all possible. She currently is having adequate access via his dialysis fistula. I explained that this may last for a while or he may have recurrent occlusions because of the aneurysmal change. He does not have any option for revision of this since the entire fistula is aneurysmal. Only option would be new access. I certainly would defer this to repeated failure of his existing access. The patient's wife understands this and we will be available if he has recurrent issues.    Rosetta Posner, MD FACS Vascular and Vein Specialists of [facility] Tel [phone] Pager [phone]

## 2016-12-15 ENCOUNTER — Encounter (INDEPENDENT_AMBULATORY_CARE_PROVIDER_SITE_OTHER): Payer: Self-pay | Admitting: Orthopedic Surgery

## 2016-12-15 ENCOUNTER — Ambulatory Visit (INDEPENDENT_AMBULATORY_CARE_PROVIDER_SITE_OTHER): Payer: Medicare Other | Admitting: Orthopedic Surgery

## 2016-12-15 VITALS — Ht 63.0 in | Wt 138.0 lb

## 2016-12-15 DIAGNOSIS — B351 Tinea unguium: Secondary | ICD-10-CM

## 2016-12-15 DIAGNOSIS — Z89431 Acquired absence of right foot: Secondary | ICD-10-CM | POA: Diagnosis not present

## 2016-12-15 NOTE — Progress Notes (Signed)
Office Visit Note   Patient: Steven Forbes           Date of Birth: [birth year]           MRN: [id number] Visit Date: 12/15/2016              Requested by: Shon Baton, MD [address] [place], [place] [address] PCP: Precious Reel, MD  Chief Complaint  Patient presents with  . Left Foot - Follow-up  . Right Foot - Follow-up      HPI: The patient is an occasional gentleman who presents today for evaluation of a transmetatarsal amputation on the right that is thickened and discolored callus. Requesting nail trim on the left. Have a small ulceration to the medial aspect of his right transmetatarsal amputation his wife has been applying ointment and doing dressing changes daily.  Assessment & Plan: Visit Diagnoses:  1. S/P transmetatarsal amputation of foot, right ([place])   2. Onychomycosis     Plan: Him they will return in 4 weeks for reevaluation of the ulcer to the right foot. Wife to continue with daily wound cleansing applied.of ointment. Weightbearing in custom orthotics and Shoes  Follow-Up Instructions: Return in about 4 weeks (around 01/12/2017).   Ortho Exam  Patient is alert, oriented, no adenopathy, well-dressed, normal affect, normal respiratory effort. Him right transmetatarsal amputation is well-healed. There is thickened callus medially and laterally this was trimmed with a 10 blade knife back to viable tissue. Medially over the first metatarsal there is an ulcer that is just 3 mm in diameter there is surrounding maceration this probes 3 mm deep. Does not probe to bone. There is no drainage appreciated today. No erythema no odor. The left foot has thickened and discolored onychomycotic nails 5 these were trimmed today without incident.  Imaging: No results found.  Labs: Lab Results  Component Value Date   HGBA1C 6.2 (H) 09/09/2013   HGBA1C  04/17/2007    5.5 (NOTE)   The ADA recommends the following therapeutic goals for glycemic   control related to Hgb A1C  measurement:   Goal of Therapy:   < 7.0% Hgb A1C   Action Suggested:  > 8.0% Hgb A1C   Ref:  Diabetes Care, 22, Suppl. 1, 1999   REPTSTATUS 09/15/2013 FINAL 09/09/2013   CULT  09/09/2013    NO GROWTH 5 DAYS Performed at [facility]    Orders:  No orders of the defined types were placed in this encounter.  No orders of the defined types were placed in this encounter.    Procedures: No procedures performed  Clinical Data: No additional findings.  ROS:  All other systems negative, except as noted in the HPI. Review of Systems  Constitutional: Negative for chills and fever.  Musculoskeletal: Negative for gait problem.  Skin: Positive for wound. Negative for color change.    Objective: Vital Signs: Ht _0  (1.6 m)   Wt 138 lb (62.6 kg)   BMI 24.45 kg/m   Specialty Comments:  No specialty comments available.  PMFS History: Patient Active Problem List   Diagnosis Date Noted  . Diabetes with neurologic complications ([place]) 98/33/8250  . Moderate dementia 01/21/2015  . Aortic stenosis 07/08/2014  . CAD (coronary artery disease) 01/07/2014  . Essential hypertension 01/07/2014  . Hyperlipidemia 01/07/2014  . End stage renal disease ([place]) 12/11/2013  . Protein-calorie malnutrition, severe ([place]) 09/11/2013  . Transaminitis 09/10/2013  . Respiratory distress 09/09/2013  . NSTEMI (non-ST elevated myocardial infarction) ([place]) 09/09/2013  .  ESRD on dialysis ([place]) 09/09/2013  . possible Pneumonia 09/09/2013  . Influenza B 09/09/2013  . Acute CHF ([place]) 09/09/2013  . Acute myocardial infarction, subendocardial infarction, initial episode of care ([place]) 09/09/2013  . Secondary hyperparathyroidism ([place]) 09/09/2013  . Atherosclerotic peripheral vascular disease ([place]) 09/09/2013  . GASTRIC POLYP 12/08/2008  . HEMORRHOIDS-INTERNAL 12/08/2008  . DIABETES MELLITUS 11/25/2008  . ANEMIA 11/25/2008  . COLONIC POLYPS, HX OF 11/25/2008   Past Medical History:  Diagnosis Date    . Anemia   . Asthma   . Dementia   . Diabetes mellitus    type 2  . ESRD (end stage renal disease) ([place])    Dialysis on MONDAY, [place] and FRIDAYS  . Flu 08/2014  . GERD (gastroesophageal reflux disease)   . History of blood transfusion   . HOH (hard of hearing)   . Hx of echocardiogram    a.  Echocardiogram (09/10/2013): EF 50-55%, normal wall motion, grade 2 diastolic dysfunction, mild aortic stenosis, trivial AI, MAC, moderate MR, mild LAE, moderate to severe TR, moderately increased PASP.  [name] Hx of non-ST elevation myocardial infarction (NSTEMI)    a. in setting of influenza, volume overload 08/2013 => Lexiscan Myoview (09/11/2013): Subtle ischemia in the anteroseptal wall, EF 44%. => med Rx recommended  . Hyperlipidemia   . Hypertension   . Shortness of breath    with exertion    Family History  Problem Relation Age of Onset  . Cancer Mother   . Hypertension Mother   . Cancer Father   . Diabetes Father   . Hypertension Father   . Diabetes Son   . Diabetes Daughter   . Diabetes Daughter   . Diabetes Cousin     Past Surgical History:  Procedure Laterality Date  . AV FISTULA PLACEMENT  2008   Left Upper Arm  . COLONOSCOPY    . EYE SURGERY Bilateral    cataract surgery  . LAPAROSCOPIC GASTROTOMY W/ REPAIR OF ULCER    . REVISON OF ARTERIOVENOUS FISTULA Left 11/21/2013   Procedure: REVISION OF ARTERIOVENOUS FISTULA;  Surgeon: Angelia Mould, MD;  Location: [facility];  Service: Vascular;  Laterality: Left;  . REVISON OF ARTERIOVENOUS FISTULA Left 02/18/2016   Procedure: excision of ulcerated skin over left brachio-cephalic AV fistula x 2;  Surgeon: [name] Misty, MD;  Location: [facility];  Service: Vascular;  Laterality: Left;  . TOE AMPUTATION Right    all toes   Social History   Occupational History  . Retired    Social History Main Topics  . Smoking status: Never Smoker  . Smokeless tobacco: Never Used  . Alcohol use No  . Drug use: No  . Sexual activity: Not  on file

## 2017-01-02 ENCOUNTER — Other Ambulatory Visit: Payer: Self-pay | Admitting: Cardiology

## 2017-01-03 NOTE — Telephone Encounter (Signed)
ASA refill refused. Patient has not seen MD since 06/2014

## 2017-01-16 ENCOUNTER — Inpatient Hospital Stay (HOSPITAL_COMMUNITY)
Admission: EM | Admit: 2017-01-16 | Discharge: 2017-01-19 | DRG: 193 | Disposition: A | Discharge status: Home Health | Payer: Medicare Other | Attending: Internal Medicine | Admitting: Internal Medicine

## 2017-01-16 DIAGNOSIS — I2510 Atherosclerotic heart disease of native coronary artery without angina pectoris: Secondary | ICD-10-CM | POA: Diagnosis present

## 2017-01-16 DIAGNOSIS — I120 Hypertensive chronic kidney disease with stage 5 chronic kidney disease or end stage renal disease: Secondary | ICD-10-CM | POA: Diagnosis present

## 2017-01-16 DIAGNOSIS — E11649 Type 2 diabetes mellitus with hypoglycemia without coma: Secondary | ICD-10-CM | POA: Diagnosis present

## 2017-01-16 DIAGNOSIS — Z79899 Other long term (current) drug therapy: Secondary | ICD-10-CM

## 2017-01-16 DIAGNOSIS — J189 Pneumonia, unspecified organism: Principal | ICD-10-CM | POA: Diagnosis present

## 2017-01-16 DIAGNOSIS — Z794 Long term (current) use of insulin: Secondary | ICD-10-CM

## 2017-01-16 DIAGNOSIS — G9341 Metabolic encephalopathy: Secondary | ICD-10-CM | POA: Diagnosis present

## 2017-01-16 DIAGNOSIS — H9190 Unspecified hearing loss, unspecified ear: Secondary | ICD-10-CM | POA: Diagnosis present

## 2017-01-16 DIAGNOSIS — E1122 Type 2 diabetes mellitus with diabetic chronic kidney disease: Secondary | ICD-10-CM | POA: Diagnosis present

## 2017-01-16 DIAGNOSIS — I350 Nonrheumatic aortic (valve) stenosis: Secondary | ICD-10-CM

## 2017-01-16 DIAGNOSIS — Z7982 Long term (current) use of aspirin: Secondary | ICD-10-CM

## 2017-01-16 DIAGNOSIS — R600 Localized edema: Secondary | ICD-10-CM | POA: Diagnosis present

## 2017-01-16 DIAGNOSIS — N2581 Secondary hyperparathyroidism of renal origin: Secondary | ICD-10-CM | POA: Diagnosis present

## 2017-01-16 DIAGNOSIS — N186 End stage renal disease: Secondary | ICD-10-CM

## 2017-01-16 DIAGNOSIS — J45909 Unspecified asthma, uncomplicated: Secondary | ICD-10-CM | POA: Diagnosis present

## 2017-01-16 DIAGNOSIS — G9340 Encephalopathy, unspecified: Secondary | ICD-10-CM

## 2017-01-16 DIAGNOSIS — E1140 Type 2 diabetes mellitus with diabetic neuropathy, unspecified: Secondary | ICD-10-CM | POA: Diagnosis present

## 2017-01-16 DIAGNOSIS — Z833 Family history of diabetes mellitus: Secondary | ICD-10-CM

## 2017-01-16 DIAGNOSIS — F0390 Unspecified dementia without behavioral disturbance: Secondary | ICD-10-CM | POA: Diagnosis present

## 2017-01-16 DIAGNOSIS — D631 Anemia in chronic kidney disease: Secondary | ICD-10-CM | POA: Diagnosis present

## 2017-01-16 DIAGNOSIS — Z992 Dependence on renal dialysis: Secondary | ICD-10-CM

## 2017-01-16 DIAGNOSIS — R531 Weakness: Secondary | ICD-10-CM

## 2017-01-16 DIAGNOSIS — I252 Old myocardial infarction: Secondary | ICD-10-CM

## 2017-01-16 DIAGNOSIS — Y95 Nosocomial condition: Secondary | ICD-10-CM | POA: Diagnosis present

## 2017-01-16 DIAGNOSIS — I10 Essential (primary) hypertension: Secondary | ICD-10-CM | POA: Diagnosis present

## 2017-01-16 DIAGNOSIS — E1149 Type 2 diabetes mellitus with other diabetic neurological complication: Secondary | ICD-10-CM | POA: Diagnosis present

## 2017-01-16 DIAGNOSIS — E785 Hyperlipidemia, unspecified: Secondary | ICD-10-CM | POA: Diagnosis present

## 2017-01-16 DIAGNOSIS — K219 Gastro-esophageal reflux disease without esophagitis: Secondary | ICD-10-CM | POA: Diagnosis present

## 2017-01-16 DIAGNOSIS — D649 Anemia, unspecified: Secondary | ICD-10-CM | POA: Diagnosis present

## 2017-01-16 DIAGNOSIS — Z8249 Family history of ischemic heart disease and other diseases of the circulatory system: Secondary | ICD-10-CM

## 2017-01-16 LAB — CBG MONITORING, ED: Glucose-Capillary: 77 mg/dL (ref 65–99)

## 2017-01-16 NOTE — ED Triage Notes (Signed)
Pt arrived via EMS from home. Family called EMS for pt not "eating well" since Saturday. Prior to Saturday, pt was able to communicate and move on his own. Family was able to get pt to eat small spoonfulls of food since Saturday. Pt has hx of CVA, Dementia, DM, HTN, and is a dialysis pt. Pt has dialysis port on right chest, which was last accessed on Friday, April 27. Pt dialysis schedule is M,W,F. Pt is confused and has stated his last name repeatedly since arrival. Pt is alert.

## 2017-01-16 NOTE — ED Provider Notes (Signed)
[facility] DEPT Provider Note   CSN: [id number] Arrival date & time: 01/16/17  2316   By signing my name below, I, Neta Mends, attest that this documentation has been prepared under the direction and in the presence of Ripley Fraise, MD . Electronically Signed: Neta Mends, ED Scribe. 01/16/2017. 11:50 PM    History   Chief Complaint Chief Complaint  Patient presents with  . Altered Mental Status   LEVEL 5 CAVEAT DUE TO AMS  The history is provided by a relative. No language interpreter was used.   HPI Comments:  Steven Forbes is a 83 y.o. male with PMHx of dementia, DM, and HTN who presents to the Emergency Department via EMS due to altered mental status x 2 weeks. Family reports that pt has not been eating well for the past 2 days, and has not had a full meal since then. They state that he has had worsening general weakness and has been talking less for the past 2 weeks, and that at baseline he is confused due to dementia. He was given fluids at home by EMS today which improved his general condition. Denies any pain, falls, or injuries, and denies any new medication. Family denies fever, vomiting, LOC.   Past Medical History:  Diagnosis Date  . Anemia   . Asthma   . Dementia   . Diabetes mellitus    type 2  . ESRD (end stage renal disease) ([place])    Dialysis on MONDAY, [place] and FRIDAYS  . Flu 08/2014  . GERD (gastroesophageal reflux disease)   . History of blood transfusion   . HOH (hard of hearing)   . Hx of echocardiogram    a.  Echocardiogram (09/10/2013): EF 50-55%, normal wall motion, grade 2 diastolic dysfunction, mild aortic stenosis, trivial AI, MAC, moderate MR, mild LAE, moderate to severe TR, moderately increased PASP.  [name] Hx of non-ST elevation myocardial infarction (NSTEMI)    a. in setting of influenza, volume overload 08/2013 => Lexiscan Myoview (09/11/2013): Subtle ischemia in the anteroseptal wall, EF 44%. => med Rx recommended  .  Hyperlipidemia   . Hypertension   . Shortness of breath    with exertion    Patient Active Problem List   Diagnosis Date Noted  . Diabetes with neurologic complications ([place]) 25/42/7062  . Moderate dementia 01/21/2015  . Aortic stenosis 07/08/2014  . CAD (coronary artery disease) 01/07/2014  . Essential hypertension 01/07/2014  . Hyperlipidemia 01/07/2014  . End stage renal disease ([place]) 12/11/2013  . Protein-calorie malnutrition, severe ([place]) 09/11/2013  . Transaminitis 09/10/2013  . Respiratory distress 09/09/2013  . NSTEMI (non-ST elevated myocardial infarction) ([place]) 09/09/2013  . ESRD on dialysis ([place]) 09/09/2013  . possible Pneumonia 09/09/2013  . Influenza B 09/09/2013  . Acute CHF ([place]) 09/09/2013  . Acute myocardial infarction, subendocardial infarction, initial episode of care ([place]) 09/09/2013  . Secondary hyperparathyroidism ([place]) 09/09/2013  . Atherosclerotic peripheral vascular disease ([place]) 09/09/2013  . GASTRIC POLYP 12/08/2008  . HEMORRHOIDS-INTERNAL 12/08/2008  . DIABETES MELLITUS 11/25/2008  . ANEMIA 11/25/2008  . COLONIC POLYPS, HX OF 11/25/2008    Past Surgical History:  Procedure Laterality Date  . AV FISTULA PLACEMENT  2008   Left Upper Arm  . COLONOSCOPY    . EYE SURGERY Bilateral    cataract surgery  . LAPAROSCOPIC GASTROTOMY W/ REPAIR OF ULCER    . REVISON OF ARTERIOVENOUS FISTULA Left 11/21/2013   Procedure: REVISION OF ARTERIOVENOUS FISTULA;  Surgeon: Angelia Mould, MD;  Location:  [facility] OR;  Service: Vascular;  Laterality: Left;  . REVISON OF ARTERIOVENOUS FISTULA Left 02/18/2016   Procedure: excision of ulcerated skin over left brachio-cephalic AV fistula x 2;  Surgeon: [name] Misty, MD;  Location: [facility];  Service: Vascular;  Laterality: Left;  . TOE AMPUTATION Right    all toes       Home Medications    Prior to Admission medications   Medication Sig Start Date End Date Taking? Authorizing Provider  ACCU-CHEK AVIVA PLUS test  strip  08/02/16   Historical Provider, MD  ACCU-CHEK SOFTCLIX LANCETS lancets  08/24/16   Historical Provider, MD  amLODipine (NORVASC) 5 MG tablet Take 5 mg by mouth daily. Reported on 03/15/2016 09/28/13   Historical Provider, MD  atorvastatin (LIPITOR) 80 MG tablet Take 80 mg by mouth daily.  09/23/13   Historical Provider, MD  calcium acetate (PHOSLO) 667 MG capsule  07/10/16   Historical Provider, MD  [facility] ASPIRIN LOW DOSE 81 MG EC tablet TAKE 1 TABLET (81 MG TOTAL) BY MOUTH DAILY. NEED OV. 11/28/16   Lelon Perla, MD  darbepoetin (ARANESP) 100 MCG/0.5ML SOLN injection Inject 0.5 mLs (100 mcg total) into the vein every Monday with hemodialysis. 09/13/13   Charlynne Cousins, MD  Insulin Isophane Human (HUMULIN N PEN [place]) Inject 5-10 Units into the skin 2 (two) times daily.     Historical Provider, MD  labetalol (NORMODYNE) 300 MG tablet Take 300 mg by mouth 3 (three) times daily.     Historical Provider, MD  memantine (NAMENDA) 5 MG tablet Take 5 mg by mouth daily.  12/23/14   Historical Provider, MD  omeprazole (PRILOSEC) 20 MG capsule Take 20 mg by mouth daily.      Historical Provider, MD  oxyCODONE-acetaminophen (ROXICET) 5-325 MG tablet Take 1 tablet by mouth every 8 (eight) hours as needed for moderate pain or severe pain. 02/18/16   Samantha J Rhyne, PA-C  PROAIR HFA 108 (90 Base) MCG/ACT inhaler Inhale 2 puffs into the lungs 3 (three) times daily.  12/14/15   Historical Provider, MD  risperiDONE (RISPERDAL) 0.5 MG tablet Take 1 mg by mouth at bedtime.  01/13/15   Historical Provider, MD    Family History Family History  Problem Relation Age of Onset  . Cancer Mother   . Hypertension Mother   . Cancer Father   . Diabetes Father   . Hypertension Father   . Diabetes Son   . Diabetes Daughter   . Diabetes Daughter   . Diabetes Cousin     Social History Social History  Substance Use Topics  . Smoking status: Never Smoker  . Smokeless tobacco: Never Used  . Alcohol use No      Allergies   Patient has no known allergies.   Review of Systems Review of Systems  Unable to perform ROS: Mental status change     Physical Exam Updated Vital Signs BP (!) 148/67 (BP Location: Right Arm)   Pulse 80   Temp 97.9 F (36.6 C) (Oral)   Resp 14   Physical Exam  Nursing note and vitals reviewed.  CONSTITUTIONAL: Elderly and frail HEAD: Normocephalic/atraumatic EYES: EOMI/PERRL ENMT: Mucous membranes dry, uvula midline NECK: supple no meningeal signs CV: S1/S2 noted, loud murmur noted LUNGS: Lungs are clear to auscultation bilaterally, no apparent distress ABDOMEN: soft, nontender, no rebound or guarding, bowel sounds noted throughout abdomen GU:no cva tenderness NEURO: Pt is awake/alert but nonverbal. Moves all extremities. Limited due to generalized weakness. No  facial droop.  EXTREMITIES: pulses normal/equal, full ROM SKIN: warm, color normal, dialysis catheter noted no right chest.  PSYCH: Unable to assess   ED Treatments / Results  DIAGNOSTIC STUDIES:  Oxygen Saturation is 100% on RA, normal by my interpretation.    COORDINATION OF CARE:  11:44 PM Will order CT of head, CXR, and blood work. Discussed treatment plan with pt at bedside and pt agreed to plan.   Labs (all labs ordered are listed, but only abnormal results are displayed) Labs Reviewed  CBC - Abnormal; Notable for the following:       Result Value   RBC 4.00 (*)    Hemoglobin 11.6 (*)    HCT 35.0 (*)    RDW 16.8 (*)    All other components within normal limits  COMPREHENSIVE METABOLIC PANEL - Abnormal; Notable for the following:    Creatinine, Ser 3.89 (*)    Calcium 6.5 (*)    Total Protein 4.6 (*)    Albumin 1.7 (*)    AST 44 (*)    GFR calc non Af Amer 13 (*)    GFR calc Af Amer 15 (*)    All other components within normal limits  ETHANOL  TROPONIN I  RAPID URINE DRUG SCREEN, HOSP PERFORMED  URINALYSIS, ROUTINE W REFLEX MICROSCOPIC  CBG MONITORING, ED    EKG   EKG Interpretation  Date/Time:  Tuesday Jan 17 2017 00:01:50 EDT Ventricular Rate:  80 PR Interval:    QRS Duration: 146 QT Interval:  437 QTC Calculation: 505 R Axis:   -83 Text Interpretation:  Sinus rhythm RBBB and LAFB Baseline wander in lead(s) I III aVR aVL aVF V1 V3 V4 Confirmed by Christy Gentles  MD, Taline Nass ([id number]) on 01/17/2017 12:10:05 AM       Radiology Dg Chest 2 View  Result Date: 01/17/2017 CLINICAL DATA:  Altered mental status with loss of appetite EXAM: CHEST  2 VIEW COMPARISON:  11/21/2013 FINDINGS: Right-sided dialysis catheter tip overlies the mid right atrium. There is mild cardiomegaly. Patchy posterior opacity, likely left lower lobe. No large effusion. Stable cardiomegaly with atherosclerosis. No pneumothorax. IMPRESSION: 1. Patchy posterior opacity, likely left base may reflect infiltrate. 2. Cardiomegaly. Electronically Signed   By: Donavan Foil M.D.   On: 01/17/2017 03:02   Ct Head Wo Contrast  Result Date: 01/17/2017 CLINICAL DATA:  Not eating well, altered mental status EXAM: CT HEAD WITHOUT CONTRAST TECHNIQUE: Contiguous axial images were obtained from the base of the skull through the vertex without intravenous contrast. COMPARISON:  12/02/2014 FINDINGS: Brain: No acute territorial infarction, hemorrhage or intracranial mass is visualized. Moderate periventricular and subcortical white matter hypodensity, consistent with small vessel ischemia, mildly progressed since the previous exam. Small focal hypodensity in the right basal ganglia may reflect small age indeterminate lacunar infarct. Mild to moderate atrophy. Nonenlarged ventricles given the presence of atrophy. Vascular: No hyperdense vessels.  Carotid artery calcifications. Skull: Heterogenous mineralization of the skullbase and mandible. Sinuses/Orbits: Remodeling of the sinuses consistent with chronic sinus disease. Mucosal thickening within the ethmoid and sphenoid sinuses. 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 No  acute orbital abnormality. Other: None IMPRESSION: 1. Negative for hemorrhage or focal mass lesion 2. Small focal hypodensity in the right basal ganglia may represent a age indeterminate lacunar infarct. 3. Moderate white matter small vessel ischemic changes, slightly progressed since 2015. Electronically Signed   By: Donavan Foil M.D.   On: 01/17/2017 03:08    Procedures Procedures  Medications Ordered in ED  Medications  ceFEPIme (MAXIPIME) 2 g in dextrose 5 % 50 mL IVPB (not administered)  vancomycin (VANCOCIN) 1,250 mg in sodium chloride 0.9 % 250 mL IVPB (not administered)  vancomycin (VANCOCIN) IVPB 750 mg/150 ml premix (not administered)     Initial Impression / Assessment and Plan / ED Course  I have reviewed the triage vital signs and the nursing notes.  Pertinent labs & imaging results that were available during my care of the patient were reviewed by me and considered in my medical decision making (see chart for details).     4:13 AM Pt in the ED for worsening generalized weakness, decreased PO CXR reveals possible pneumonia Given age/history and lack of PO intake, will admit for monitoring and IV antibiotics Patient/family agreeable Pt resting comfortably but arouses to voice and smiles  D/w Dr Hal Hope for admission   Final Clinical Impressions(s) / ED Diagnoses   Final diagnoses:  HCAP (healthcare-associated pneumonia)  Weakness    New Prescriptions New Prescriptions   No medications on file  I personally performed the services described in this documentation, which was scribed in my presence. The recorded information has been reviewed and is accurate.        Ripley Fraise, MD 01/17/17 [phone]

## 2017-01-17 ENCOUNTER — Emergency Department (HOSPITAL_COMMUNITY): Payer: Medicare Other

## 2017-01-17 ENCOUNTER — Encounter (HOSPITAL_COMMUNITY): Payer: Self-pay | Admitting: Internal Medicine

## 2017-01-17 DIAGNOSIS — K219 Gastro-esophageal reflux disease without esophagitis: Secondary | ICD-10-CM | POA: Diagnosis present

## 2017-01-17 DIAGNOSIS — D631 Anemia in chronic kidney disease: Secondary | ICD-10-CM | POA: Diagnosis present

## 2017-01-17 DIAGNOSIS — R531 Weakness: Secondary | ICD-10-CM | POA: Diagnosis present

## 2017-01-17 DIAGNOSIS — E0840 Diabetes mellitus due to underlying condition with diabetic neuropathy, unspecified: Secondary | ICD-10-CM | POA: Diagnosis not present

## 2017-01-17 DIAGNOSIS — E1122 Type 2 diabetes mellitus with diabetic chronic kidney disease: Secondary | ICD-10-CM | POA: Diagnosis present

## 2017-01-17 DIAGNOSIS — Z79899 Other long term (current) drug therapy: Secondary | ICD-10-CM | POA: Diagnosis not present

## 2017-01-17 DIAGNOSIS — J189 Pneumonia, unspecified organism: Secondary | ICD-10-CM | POA: Diagnosis not present

## 2017-01-17 DIAGNOSIS — I120 Hypertensive chronic kidney disease with stage 5 chronic kidney disease or end stage renal disease: Secondary | ICD-10-CM | POA: Diagnosis present

## 2017-01-17 DIAGNOSIS — I252 Old myocardial infarction: Secondary | ICD-10-CM | POA: Diagnosis not present

## 2017-01-17 DIAGNOSIS — I10 Essential (primary) hypertension: Secondary | ICD-10-CM | POA: Diagnosis not present

## 2017-01-17 DIAGNOSIS — Z992 Dependence on renal dialysis: Secondary | ICD-10-CM | POA: Diagnosis not present

## 2017-01-17 DIAGNOSIS — Z8249 Family history of ischemic heart disease and other diseases of the circulatory system: Secondary | ICD-10-CM | POA: Diagnosis not present

## 2017-01-17 DIAGNOSIS — E1140 Type 2 diabetes mellitus with diabetic neuropathy, unspecified: Secondary | ICD-10-CM | POA: Diagnosis present

## 2017-01-17 DIAGNOSIS — E11649 Type 2 diabetes mellitus with hypoglycemia without coma: Secondary | ICD-10-CM | POA: Diagnosis present

## 2017-01-17 DIAGNOSIS — J45909 Unspecified asthma, uncomplicated: Secondary | ICD-10-CM | POA: Diagnosis present

## 2017-01-17 DIAGNOSIS — N2581 Secondary hyperparathyroidism of renal origin: Secondary | ICD-10-CM | POA: Diagnosis present

## 2017-01-17 DIAGNOSIS — F0390 Unspecified dementia without behavioral disturbance: Secondary | ICD-10-CM | POA: Diagnosis present

## 2017-01-17 DIAGNOSIS — R600 Localized edema: Secondary | ICD-10-CM | POA: Diagnosis present

## 2017-01-17 DIAGNOSIS — G9341 Metabolic encephalopathy: Secondary | ICD-10-CM | POA: Diagnosis present

## 2017-01-17 DIAGNOSIS — I2510 Atherosclerotic heart disease of native coronary artery without angina pectoris: Secondary | ICD-10-CM | POA: Diagnosis not present

## 2017-01-17 DIAGNOSIS — N186 End stage renal disease: Secondary | ICD-10-CM | POA: Diagnosis not present

## 2017-01-17 DIAGNOSIS — H9190 Unspecified hearing loss, unspecified ear: Secondary | ICD-10-CM | POA: Diagnosis present

## 2017-01-17 DIAGNOSIS — Z7982 Long term (current) use of aspirin: Secondary | ICD-10-CM | POA: Diagnosis not present

## 2017-01-17 DIAGNOSIS — Y95 Nosocomial condition: Secondary | ICD-10-CM | POA: Diagnosis present

## 2017-01-17 DIAGNOSIS — G9340 Encephalopathy, unspecified: Secondary | ICD-10-CM | POA: Diagnosis not present

## 2017-01-17 DIAGNOSIS — Z794 Long term (current) use of insulin: Secondary | ICD-10-CM | POA: Diagnosis not present

## 2017-01-17 DIAGNOSIS — Z833 Family history of diabetes mellitus: Secondary | ICD-10-CM | POA: Diagnosis not present

## 2017-01-17 DIAGNOSIS — I350 Nonrheumatic aortic (valve) stenosis: Secondary | ICD-10-CM | POA: Diagnosis present

## 2017-01-17 DIAGNOSIS — E785 Hyperlipidemia, unspecified: Secondary | ICD-10-CM | POA: Diagnosis present

## 2017-01-17 LAB — SURGICAL PCR SCREEN
MRSA, PCR: NEGATIVE
STAPHYLOCOCCUS AUREUS: NEGATIVE

## 2017-01-17 LAB — COMPREHENSIVE METABOLIC PANEL
ALBUMIN: 1.7 g/dL — AB (ref 3.5–5.0)
ALBUMIN: 2.1 g/dL — AB (ref 3.5–5.0)
ALK PHOS: 121 U/L (ref 38–126)
ALK PHOS: 159 U/L — AB (ref 38–126)
ALT: 37 U/L (ref 17–63)
ALT: 50 U/L (ref 17–63)
ANION GAP: 5 (ref 5–15)
ANION GAP: 8 (ref 5–15)
AST: 44 U/L — ABNORMAL HIGH (ref 15–41)
AST: 46 U/L — AB (ref 15–41)
BILIRUBIN TOTAL: 0.8 mg/dL (ref 0.3–1.2)
BILIRUBIN TOTAL: 1 mg/dL (ref 0.3–1.2)
BUN: 12 mg/dL (ref 6–20)
BUN: 14 mg/dL (ref 6–20)
CO2: 24 mmol/L (ref 22–32)
CO2: 27 mmol/L (ref 22–32)
Calcium: 6.5 mg/dL — ABNORMAL LOW (ref 8.9–10.3)
Calcium: 7.9 mg/dL — ABNORMAL LOW (ref 8.9–10.3)
Chloride: 111 mmol/L (ref 101–111)
Chloride: 103 mmol/L (ref 101–111)
Creatinine, Ser: 3.89 mg/dL — ABNORMAL HIGH (ref 0.61–1.24)
Creatinine, Ser: 4.78 mg/dL — ABNORMAL HIGH (ref 0.61–1.24)
GFR calc Af Amer: 15 mL/min — ABNORMAL LOW (ref >60)
GFR calc Af Amer: 11 mL/min — ABNORMAL LOW (ref >60)
GFR calc non Af Amer: 13 mL/min — ABNORMAL LOW (ref >60)
GFR calc non Af Amer: 10 mL/min — ABNORMAL LOW (ref >60)
GLUCOSE: 65 mg/dL (ref 65–99)
GLUCOSE: 69 mg/dL (ref 65–99)
POTASSIUM: 5.1 mmol/L (ref 3.5–5.1)
Potassium: 4.9 mmol/L (ref 3.5–5.1)
SODIUM: 138 mmol/L (ref 135–145)
Sodium: 140 mmol/L (ref 135–145)
TOTAL PROTEIN: 4.6 g/dL — AB (ref 6.5–8.1)
Total Protein: 6 g/dL — ABNORMAL LOW (ref 6.5–8.1)

## 2017-01-17 LAB — CBC WITH DIFFERENTIAL/PLATELET
Basophils Absolute: 0 10*3/uL (ref 0.0–0.1)
Basophils Relative: 0 %
EOS PCT: 1 %
Eosinophils Absolute: 0.1 10*3/uL (ref 0.0–0.7)
HEMATOCRIT: 36 % — AB (ref 39.0–52.0)
Hemoglobin: 11.9 g/dL — ABNORMAL LOW (ref 13.0–17.0)
LYMPHS ABS: 1.8 10*3/uL (ref 0.7–4.0)
LYMPHS PCT: 26 %
MCH: 29.4 pg (ref 26.0–34.0)
MCHC: 33.1 g/dL (ref 30.0–36.0)
MCV: 88.9 fL (ref 78.0–100.0)
MONO ABS: 0.8 10*3/uL (ref 0.1–1.0)
MONOS PCT: 12 %
NEUTROS ABS: 4 10*3/uL (ref 1.7–7.7)
Neutrophils Relative %: 61 %
PLATELETS: 96 10*3/uL — AB (ref 150–400)
RBC: 4.05 MIL/uL — ABNORMAL LOW (ref 4.22–5.81)
RDW: 16.2 % — AB (ref 11.5–15.5)
WBC: 6.7 10*3/uL (ref 4.0–10.5)

## 2017-01-17 LAB — CBC
HEMATOCRIT: 35 % — AB (ref 39.0–52.0)
Hemoglobin: 11.6 g/dL — ABNORMAL LOW (ref 13.0–17.0)
MCH: 29 pg (ref 26.0–34.0)
MCHC: 33.1 g/dL (ref 30.0–36.0)
MCV: 87.5 fL (ref 78.0–100.0)
Platelets: 213 10*3/uL (ref 150–400)
RBC: 4 MIL/uL — ABNORMAL LOW (ref 4.22–5.81)
RDW: 16.8 % — AB (ref 11.5–15.5)
WBC: 6.3 10*3/uL (ref 4.0–10.5)

## 2017-01-17 LAB — ETHANOL: Alcohol, Ethyl (B): <5 mg/dL (ref <5)

## 2017-01-17 LAB — GLUCOSE, CAPILLARY
Glucose-Capillary: 202 mg/dL — ABNORMAL HIGH (ref 65–99)
Glucose-Capillary: 78 mg/dL (ref 65–99)
Glucose-Capillary: 92 mg/dL (ref 65–99)
Glucose-Capillary: 71 mg/dL (ref 65–99)
Glucose-Capillary: 103 mg/dL — ABNORMAL HIGH (ref 65–99)

## 2017-01-17 LAB — TROPONIN I: Troponin I: <0.03 ng/mL (ref <0.03)

## 2017-01-17 MED ORDER — DEXTROSE 5 % IV SOLN
2.0000 g | Freq: Once | INTRAVENOUS | Status: AC
  Administered 2017-01-17: 2 g via INTRAVENOUS
  Filled 2017-01-17: qty 2

## 2017-01-17 MED ORDER — DEXTROSE 50 % IV SOLN
INTRAVENOUS | Status: AC
  Administered 2017-01-17: 50 mL via INTRAVENOUS
  Filled 2017-01-17: qty 50

## 2017-01-17 MED ORDER — CALCIUM ACETATE (PHOS BINDER) 667 MG PO CAPS
667.0000 mg | ORAL_CAPSULE | Freq: Three times a day (TID) | ORAL | Status: DC
Start: 2017-01-17 — End: 2017-01-19
  Administered 2017-01-18 – 2017-01-19 (×3): 667 mg via ORAL
  Filled 2017-01-17 (×3): qty 1

## 2017-01-17 MED ORDER — RISPERIDONE 1 MG PO TABS
1.0000 mg | ORAL_TABLET | Freq: Every day | ORAL | Status: DC
  Administered 2017-01-17 – 2017-01-18 (×2): 1 mg via ORAL
  Filled 2017-01-17 (×3): qty 1

## 2017-01-17 MED ORDER — LABETALOL HCL 300 MG PO TABS
300.0000 mg | ORAL_TABLET | Freq: Three times a day (TID) | ORAL | Status: DC
  Administered 2017-01-17 – 2017-01-19 (×6): 300 mg via ORAL
  Filled 2017-01-17 (×3): qty 1
  Filled 2017-01-17 (×3): qty 2

## 2017-01-17 MED ORDER — OXYCODONE-ACETAMINOPHEN 5-325 MG PO TABS: 1.0000 | ORAL_TABLET | Freq: Three times a day (TID) | ORAL | Status: DC | PRN

## 2017-01-17 MED ORDER — ALBUTEROL SULFATE (2.5 MG/3ML) 0.083% IN NEBU: 3.0000 mL | INHALATION_SOLUTION | RESPIRATORY_TRACT | Status: DC | PRN

## 2017-01-17 MED ORDER — HYDRALAZINE HCL 20 MG/ML IJ SOLN
10.0000 mg | INTRAMUSCULAR | Status: DC | PRN
  Administered 2017-01-17: 10 mg via INTRAVENOUS
  Filled 2017-01-17: qty 1

## 2017-01-17 MED ORDER — MEMANTINE HCL 5 MG PO TABS
5.0000 mg | ORAL_TABLET | Freq: Every day | ORAL | Status: DC
Start: 2017-01-17 — End: 2017-01-19
  Administered 2017-01-17 – 2017-01-19 (×3): 5 mg via ORAL
  Filled 2017-01-17 (×3): qty 1

## 2017-01-17 MED ORDER — INSULIN ASPART 100 UNIT/ML ~~LOC~~ SOLN: 0.0000 [IU] | SUBCUTANEOUS | Status: DC

## 2017-01-17 MED ORDER — HEPARIN SODIUM (PORCINE) 5000 UNIT/ML IJ SOLN
5000.0000 [IU] | Freq: Three times a day (TID) | INTRAMUSCULAR | Status: DC
  Administered 2017-01-17 – 2017-01-19 (×8): 5000 [IU] via SUBCUTANEOUS
  Filled 2017-01-17 (×8): qty 1

## 2017-01-17 MED ORDER — ACETAMINOPHEN 650 MG RE SUPP: 650.0000 mg | Freq: Four times a day (QID) | RECTAL | Status: DC | PRN

## 2017-01-17 MED ORDER — VANCOMYCIN HCL 10 G IV SOLR
1250.0000 mg | Freq: Once | INTRAVENOUS | Status: AC
  Administered 2017-01-17: 1250 mg via INTRAVENOUS
  Filled 2017-01-17: qty 1250

## 2017-01-17 MED ORDER — ATORVASTATIN CALCIUM 80 MG PO TABS
80.0000 mg | ORAL_TABLET | Freq: Every day | ORAL | Status: DC
  Administered 2017-01-17 – 2017-01-19 (×2): 80 mg via ORAL
  Filled 2017-01-17 (×3): qty 1

## 2017-01-17 MED ORDER — DEXTROSE 5 % IV SOLN
1.0000 g | INTRAVENOUS | Status: DC
  Administered 2017-01-17: 1 g via INTRAVENOUS
  Filled 2017-01-17: qty 1

## 2017-01-17 MED ORDER — VANCOMYCIN HCL IN DEXTROSE 750-5 MG/150ML-% IV SOLN
750.0000 mg | INTRAVENOUS | Status: DC
Start: 2017-01-18 — End: 2017-01-18
  Filled 2017-01-17: qty 150

## 2017-01-17 MED ORDER — ASPIRIN EC 81 MG PO TBEC
81.0000 mg | DELAYED_RELEASE_TABLET | Freq: Every day | ORAL | Status: DC
  Administered 2017-01-17 – 2017-01-19 (×3): 81 mg via ORAL
  Filled 2017-01-17 (×3): qty 1

## 2017-01-17 MED ORDER — DEXTROSE 5 % IV SOLN
INTRAVENOUS | Status: DC
  Administered 2017-01-17: 21:00:00 via INTRAVENOUS

## 2017-01-17 MED ORDER — PANTOPRAZOLE SODIUM 40 MG PO TBEC
40.0000 mg | DELAYED_RELEASE_TABLET | Freq: Every day | ORAL | Status: DC
  Administered 2017-01-17 – 2017-01-19 (×3): 40 mg via ORAL
  Filled 2017-01-17 (×3): qty 1

## 2017-01-17 MED ORDER — DOXERCALCIFEROL 4 MCG/2ML IV SOLN
3.0000 ug | INTRAVENOUS | Status: DC
  Administered 2017-01-18: 3 ug via INTRAVENOUS

## 2017-01-17 MED ORDER — ACETAMINOPHEN 325 MG PO TABS: 650.0000 mg | ORAL_TABLET | Freq: Four times a day (QID) | ORAL | Status: DC | PRN

## 2017-01-17 MED ORDER — ALBUTEROL SULFATE (2.5 MG/3ML) 0.083% IN NEBU
3.0000 mL | INHALATION_SOLUTION | Freq: Three times a day (TID) | RESPIRATORY_TRACT | Status: DC
  Administered 2017-01-17: 3 mL via RESPIRATORY_TRACT
  Filled 2017-01-17: qty 3

## 2017-01-17 MED ORDER — INSULIN ASPART 100 UNIT/ML ~~LOC~~ SOLN: 0.0000 [IU] | Freq: Three times a day (TID) | SUBCUTANEOUS | Status: DC

## 2017-01-17 NOTE — ED Notes (Signed)
MD advised to hold off on in and out. Pt does not make much urine

## 2017-01-17 NOTE — H&P (Signed)
History and Physical    Zollie Pee ZGY:[id number] DOB: 09/18/1926 DOA: 01/16/2017  PCP: Precious Reel, MD  Patient coming from: Home.  Chief Complaint: Weakness.  HPI: Steven Forbes is a 83 y.o. male with history of dementia, ESRD on hemodialysis on Monday Wednesday Friday, hypertension, diabetes mellitus, chronic anemia, CAD who had new subclavian hemodialysis catheter placed yesterday was brought to the ER because of increasing lethargy and weakness over the last 3 days. Patient also has been having poor oral intake. Family denies any nausea vomiting diarrhea chest pain and productive cough fever or chills. Denies any change in medications.   ED Course: In the ER CT head was unremarkable. X-ray show possible infiltrates/opacity concerning for pneumonia. Patient was placed on antibiotics and admitted for further management. On exam patient follows commands and moves all extremities.  Review of Systems: As per HPI, rest all negative.   Past Medical History:  Diagnosis Date  . Anemia   . Asthma   . Dementia   . Diabetes mellitus    type 2  . ESRD (end stage renal disease) ([place])    Dialysis on MONDAY, [place] and FRIDAYS  . Flu 08/2014  . GERD (gastroesophageal reflux disease)   . History of blood transfusion   . HOH (hard of hearing)   . Hx of echocardiogram    a.  Echocardiogram (09/10/2013): EF 50-55%, normal wall motion, grade 2 diastolic dysfunction, mild aortic stenosis, trivial AI, MAC, moderate MR, mild LAE, moderate to severe TR, moderately increased PASP.  [name] Hx of non-ST elevation myocardial infarction (NSTEMI)    a. in setting of influenza, volume overload 08/2013 => Lexiscan Myoview (09/11/2013): Subtle ischemia in the anteroseptal wall, EF 44%. => med Rx recommended  . Hyperlipidemia   . Hypertension   . Shortness of breath    with exertion    Past Surgical History:  Procedure Laterality Date  . AV FISTULA PLACEMENT  2008   Left Upper Arm  . COLONOSCOPY      . EYE SURGERY Bilateral    cataract surgery  . LAPAROSCOPIC GASTROTOMY W/ REPAIR OF ULCER    . REVISON OF ARTERIOVENOUS FISTULA Left 11/21/2013   Procedure: REVISION OF ARTERIOVENOUS FISTULA;  Surgeon: Angelia Mould, MD;  Location: [facility];  Service: Vascular;  Laterality: Left;  . REVISON OF ARTERIOVENOUS FISTULA Left 02/18/2016   Procedure: excision of ulcerated skin over left brachio-cephalic AV fistula x 2;  Surgeon: [name] Misty, MD;  Location: [facility];  Service: Vascular;  Laterality: Left;  . TOE AMPUTATION Right    all toes     reports that he has never smoked. He has never used smokeless tobacco. He reports that he does not drink alcohol or use drugs.  No Known Allergies  Family History  Problem Relation Age of Onset  . Cancer Mother   . Hypertension Mother   . Cancer Father   . Diabetes Father   . Hypertension Father   . Diabetes Son   . Diabetes Daughter   . Diabetes Daughter   . Diabetes Cousin     Prior to Admission medications   Medication Sig Start Date End Date Taking? Authorizing Provider  ACCU-CHEK AVIVA PLUS test strip  08/02/16   Historical Provider, MD  ACCU-CHEK SOFTCLIX LANCETS lancets  08/24/16   Historical Provider, MD  amLODipine (NORVASC) 5 MG tablet Take 5 mg by mouth daily. Reported on 03/15/2016 09/28/13   Historical Provider, MD  atorvastatin (LIPITOR) 80 MG tablet Take 80  mg by mouth daily.  09/23/13   Historical Provider, MD  calcium acetate (PHOSLO) 667 MG capsule  07/10/16   Historical Provider, MD  CVS ASPIRIN LOW DOSE 81 MG EC tablet TAKE 1 TABLET (81 MG TOTAL) BY MOUTH DAILY. NEED OV. 11/28/16   Lelon Perla, MD  darbepoetin (ARANESP) 100 MCG/0.5ML SOLN injection Inject 0.5 mLs (100 mcg total) into the vein every Monday with hemodialysis. 09/13/13   Charlynne Cousins, MD  Insulin Isophane Human (HUMULIN N PEN [place]) Inject 5-10 Units into the skin 2 (two) times daily.     Historical Provider, MD  labetalol (NORMODYNE) 300 MG tablet Take 300  mg by mouth 3 (three) times daily.     Historical Provider, MD  memantine (NAMENDA) 5 MG tablet Take 5 mg by mouth daily.  12/23/14   Historical Provider, MD  omeprazole (PRILOSEC) 20 MG capsule Take 20 mg by mouth daily.      Historical Provider, MD  oxyCODONE-acetaminophen (ROXICET) 5-325 MG tablet Take 1 tablet by mouth every 8 (eight) hours as needed for moderate pain or severe pain. 02/18/16   Samantha J Rhyne, PA-C  PROAIR HFA 108 (90 Base) MCG/ACT inhaler Inhale 2 puffs into the lungs 3 (three) times daily.  12/14/15   Historical Provider, MD  risperiDONE (RISPERDAL) 0.5 MG tablet Take 1 mg by mouth at bedtime.  01/13/15   Historical Provider, MD    Physical Exam: Vitals:   01/16/17 2318 01/17/17 0130 01/17/17 0230 01/17/17 0345  BP: (!) 148/67 139/78 (!) 155/78 (!) 159/85  Pulse: 80 99  81  Resp: _0 Temp: 97.9 F (36.6 C)     TempSrc: Oral     SpO2:  90%  100%      Constitutional: Moderately built and nourished. Vitals:   01/16/17 2318 01/17/17 0130 01/17/17 0230 01/17/17 0345  BP: (!) 148/67 139/78 (!) 155/78 (!) 159/85  Pulse: 80 99  81  Resp: _1 Temp: 97.9 F (36.6 C)     TempSrc: Oral     SpO2:  90%  100%   Eyes: Anicteric. No pallor. ENMT: No discharge from the ears eyes nose or mouth. Neck: No mass felt. No neck rigidity. Respiratory: No rhonchi or crepitations. Cardiovascular: S1 and S2 heard no murmurs appreciated. Abdomen: Soft nontender bowel sounds present. No guarding or rigidity. Musculoskeletal: No edema. No joint effusion. Skin: No rash. Skin appears warm. Neurologic: Alert awake oriented to his name follows commands and moves all extremities. Psychiatric: Patient has dementia and is oriented to his name only.   Labs on Admission: I have personally reviewed following labs and imaging studies  CBC:  Recent Labs Lab 01/16/17 2337  WBC 6.3  HGB 11.6*  HCT 35.0*  MCV 87.5  PLT 390   Basic Metabolic Panel:  Recent Labs Lab  01/17/17 0255  NA 140  K 4.9  CL 111  CO2 24  GLUCOSE 65  BUN 12  CREATININE 3.89*  CALCIUM 6.5*   GFR: CrCl cannot be calculated (Unknown ideal weight.). Liver Function Tests:  Recent Labs Lab 01/17/17 0255  AST 44*  ALT 37  ALKPHOS 121  BILITOT 0.8  PROT 4.6*  ALBUMIN 1.7*   No results for input(s): LIPASE, AMYLASE in the last 168 hours. No results for input(s): AMMONIA in the last 168 hours. Coagulation Profile: No results for input(s): INR, PROTIME in the last 168 hours. Cardiac Enzymes:  Recent Labs Lab 01/17/17 0021  TROPONINI <  0.03   BNP (last 3 results) No results for input(s): PROBNP in the last 8760 hours. HbA1C: No results for input(s): HGBA1C in the last 72 hours. CBG:  Recent Labs Lab 01/17/17 0000  GLUCAP 77   Lipid Profile: No results for input(s): CHOL, HDL, LDLCALC, TRIG, CHOLHDL, LDLDIRECT in the last 72 hours. Thyroid Function Tests: No results for input(s): TSH, T4TOTAL, FREET4, T3FREE, THYROIDAB in the last 72 hours. Anemia Panel: No results for input(s): VITAMINB12, FOLATE, FERRITIN, TIBC, IRON, RETICCTPCT in the last 72 hours. Urine analysis: No results found for: COLORURINE, APPEARANCEUR, LABSPEC, PHURINE, GLUCOSEU, HGBUR, BILIRUBINUR, KETONESUR, PROTEINUR, UROBILINOGEN, NITRITE, LEUKOCYTESUR Sepsis Labs: _0 (procalcitonin:4,lacticidven:4) )No results found for this or any previous visit (from the past 240 hour(s)).   Radiological Exams on Admission: Dg Chest 2 View  Result Date: 01/17/2017 CLINICAL DATA:  Altered mental status with loss of appetite EXAM: CHEST  2 VIEW COMPARISON:  11/21/2013 FINDINGS: Right-sided dialysis catheter tip overlies the mid right atrium. There is mild cardiomegaly. Patchy posterior opacity, likely left lower lobe. No large effusion. Stable cardiomegaly with atherosclerosis. No pneumothorax. IMPRESSION: 1. Patchy posterior opacity, likely left base may reflect infiltrate. 2. Cardiomegaly.  Electronically Signed   By: Donavan Foil M.D.   On: 01/17/2017 03:02   Ct Head Wo Contrast  Result Date: 01/17/2017 CLINICAL DATA:  Not eating well, altered mental status EXAM: CT HEAD WITHOUT CONTRAST TECHNIQUE: Contiguous axial images were obtained from the base of the skull through the vertex without intravenous contrast. COMPARISON:  12/02/2014 FINDINGS: Brain: No acute territorial infarction, hemorrhage or intracranial mass is visualized. Moderate periventricular and subcortical white matter hypodensity, consistent with small vessel ischemia, mildly progressed since the previous exam. Small focal hypodensity in the right basal ganglia may reflect small age indeterminate lacunar infarct. Mild to moderate atrophy. Nonenlarged ventricles given the presence of atrophy. Vascular: No hyperdense vessels.  Carotid artery calcifications. Skull: Heterogenous mineralization of the skullbase and mandible. Sinuses/Orbits: Remodeling of the sinuses consistent with chronic sinus disease. Mucosal thickening within the ethmoid and sphenoid sinuses. 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 No acute orbital abnormality. Other: None IMPRESSION: 1. Negative for hemorrhage or focal mass lesion 2. Small focal hypodensity in the right basal ganglia may represent a age indeterminate lacunar infarct. 3. Moderate white matter small vessel ischemic changes, slightly progressed since 2015. Electronically Signed   By: Donavan Foil M.D.   On: 01/17/2017 03:08    EKG: Independently reviewed. Normal sinus rhythm with RBBB.  Assessment/Plan Principal Problem:   HCAP (healthcare-associated pneumonia) Active Problems:   ANEMIA   ESRD on dialysis [facility])   possible Pneumonia   CAD (coronary artery disease)   Essential hypertension   Aortic stenosis   Diabetes with neurologic complications ([place])    1. Healthcare associated pneumonia - likely causing his symptoms. Patient is placed on vancomycin and cefepime. Will get swallow  evaluation. If symptoms does not improve may get MRI of the brain. 2. ESRD on hemodialysis Monday Wednesday and Friday - family states patient did not misses dialysis. Consult nephrology for dialysis. 3. Diabetes mellitus type 2 - patient is usually on insulin which family had not given last 2 days due to poor oral intake. Will keep patient on sliding-scale and closely follow CBGs. 4. Hypertension - on labetalol. We'll also keep patient on when necessary IV hydralazine until patient can swallow reliably. 5. Chronic anemia probably from ESRD - follow CBC. 6. History of CAD - has not complained of any chest pain.  DVT prophylaxis: Heparin. Code Status: Full code.  Family Communication: Patient's daughter.  Disposition Plan: Home.  Consults called: None.  Admission status: Inpatient.    Rise Patience MD [facility] Pager [phone].  If 7PM-7AM, please contact night-coverage [email or web address] Password [email or web address]  01/17/2017, 5:13 AM

## 2017-01-17 NOTE — Evaluation (Signed)
Clinical/Bedside Swallow Evaluation Patient Details  Name: Steven Forbes MRN: [id number] Date of Birth: 02/11/1926  Today's Date: 01/17/2017 Time: SLP Start Time (ACUTE ONLY): 1208 SLP Stop Time (ACUTE ONLY): 1230 SLP Time Calculation (min) (ACUTE ONLY): 22 min  Past Medical History:  Past Medical History:  Diagnosis Date  . Anemia   . Asthma   . Dementia   . Diabetes mellitus    type 2  . ESRD (end stage renal disease) ([place])    Dialysis on MONDAY, [place] and FRIDAYS  . Flu 08/2014  . GERD (gastroesophageal reflux disease)   . History of blood transfusion   . HOH (hard of hearing)   . Hx of echocardiogram    a.  Echocardiogram (09/10/2013): EF 50-55%, normal wall motion, grade 2 diastolic dysfunction, mild aortic stenosis, trivial AI, MAC, moderate MR, mild LAE, moderate to severe TR, moderately increased PASP.  [name] Hx of non-ST elevation myocardial infarction (NSTEMI)    a. in setting of influenza, volume overload 08/2013 => Lexiscan Myoview (09/11/2013): Subtle ischemia in the anteroseptal wall, EF 44%. => med Rx recommended  . Hyperlipidemia   . Hypertension   . Shortness of breath    with exertion   Past Surgical History:  Past Surgical History:  Procedure Laterality Date  . AV FISTULA PLACEMENT  2008   Left Upper Arm  . COLONOSCOPY    . EYE SURGERY Bilateral    cataract surgery  . LAPAROSCOPIC GASTROTOMY W/ REPAIR OF ULCER    . REVISON OF ARTERIOVENOUS FISTULA Left 11/21/2013   Procedure: REVISION OF ARTERIOVENOUS FISTULA;  Surgeon: Angelia Mould, MD;  Location: [facility];  Service: Vascular;  Laterality: Left;  . REVISON OF ARTERIOVENOUS FISTULA Left 02/18/2016   Procedure: excision of ulcerated skin over left brachio-cephalic AV fistula x 2;  Surgeon: [name] Misty, MD;  Location: [place];  Service: Vascular;  Laterality: Left;  . TOE AMPUTATION Right    all toes   HPI:  82 y.o.malewith history of dementia, ESRD on hemodialysis on Monday Wednesday Friday,  hypertension, diabetes mellitus, chronic anemia, CAD who had new subclavian hemodialysis catheter placed 4/29 was brought to the ER because of increasing lethargy and weakness. Dx HCAP, metabolic encephalopathy.  Per son in law, pt has had more difficulty with eating/swallowing; he is eating less; holds food in his mouth frequently.     Assessment / Plan / Recommendation Clinical Impression  Pt presents with a dementia-related dysphagia primarily marked by significant oral holding of POs.  Occurs with purees and thin liquids, requiring max verbal cues and gentle palpation under the tongue in an effort to stimulate a swallow response.  Eventually, swallow is elicited after significant delay.  No overt s/s of aspiration, although pt is certainly at risk.  For now, recommend resuming a pureed/dysphagia 1 diet, thin liquids; full supervision and cues to swalllow.  Will follow for family education, diet toleration. D/W son in law.  SLP Visit Diagnosis: Dysphagia, unspecified (R13.10)    Aspiration Risk  Moderate aspiration risk    Diet Recommendation   dys 1, thin liquids   Medication Administration: Whole meds with liquid    Other  Recommendations Oral Care Recommendations: Oral care BID   Follow up Recommendations  (tba)      Frequency and Duration min 2x/week  1 week       Prognosis Prognosis for Safe Diet Advancement: Guarded      Swallow Study   General Date of Onset: 01/16/17 HPI: 83 y.o.malewith  history of dementia, ESRD on hemodialysis on Monday Wednesday Friday, hypertension, diabetes mellitus, chronic anemia, CAD who had new subclavian hemodialysis catheter placed 4/29 was brought to the ER because of increasing lethargy and weakness. Dx HCAP, metabolic encephalopathy.  Per son in law, pt has had more difficulty with eating/swallowing; he is eating less; holds food in his mouth frequently.   Type of Study: Bedside Swallow Evaluation Previous Swallow Assessment: no Diet Prior  to this Study: NPO Temperature Spikes Noted: No Respiratory Status: Room air (Bilateral decreased breath sounds at bases; few scattered cr) History of Recent Intubation: No Behavior/Cognition: Alert;Confused;Requires cueing Oral Cavity Assessment: Within Functional Limits Oral Cavity - Dentition: Dentures, top Vision: Functional for self-feeding Self-Feeding Abilities: Needs assist Patient Positioning: Upright in bed Baseline Vocal Quality: Normal Volitional Cough: Cognitively unable to elicit Volitional Swallow: Unable to elicit    Oral/Motor/Sensory Function Overall Oral Motor/Sensory Function: Other (comment) (possible mild left lower face asymmetry at rest)   Ice Chips Ice chips: Not tested   Thin Liquid Thin Liquid: Impaired Presentation: Cup;Straw Oral Phase Functional Implications: Oral residue;Oral holding Pharyngeal  Phase Impairments: Suspected delayed Swallow    Nectar Thick Nectar Thick Liquid: Not tested   Honey Thick Honey Thick Liquid: Not tested   Puree Puree: Impaired Presentation: Spoon Oral Phase Functional Implications: Oral residue;Right lateral sulci pocketing;Left lateral sulci pocketing Pharyngeal Phase Impairments: Suspected delayed Swallow   Solid   GO   Solid: Not tested        Steven Forbes 01/17/2017,12:37 PM

## 2017-01-17 NOTE — Progress Notes (Signed)
Patient arrived from ED with CAP. Alert. Family by the bedside. Telemetry applied. Skin assessment done.

## 2017-01-17 NOTE — Evaluation (Signed)
Physical Therapy Evaluation Patient Details Name: Eulis Salazar MRN: [id number] DOB: 1925/09/23 Today's Date: 01/17/2017   History of Present Illness  Jsaon Yoo is a 83 y.o. male with history of dementia, ESRD on hemodialysis on Monday Wednesday Friday, hypertension, diabetes mellitus, chronic anemia, CAD who had new subclavian hemodialysis catheter placed yesterday was brought to the ER because of increasing lethargy and weakness over the last 3 days. Patient also has been having poor oral intake.   Clinical Impression  Pt admitted with above diagnosis. Pt currently with functional limitations due to the deficits listed below (see PT Problem List). Spoke with pt's daughter regarding d/c. Case management to provide personal caregiver list. Pt will benefit from skilled PT to increase their independence and safety with mobility to allow discharge to the venue listed below.       Follow Up Recommendations Home health PT;Supervision/Assistance - 24 hour (if family wants to pursue, unsure with dementia)    Equipment Recommendations  Rolling walker with 5" wheels (son-in-law has one he can borrow)    Recommendations for Other Services       Precautions / Restrictions Precautions Precautions: Fall Restrictions Weight Bearing Restrictions: No      Mobility  Bed Mobility Overal bed mobility: Needs Assistance Bed Mobility: Supine to Sit;Sit to Supine     Supine to sit: Mod assist Sit to supine: Mod assist   General bed mobility comments: pt with no initiation of task, pt able to complete with tactile cues and modA for trunk elevation and LE management back into bed  Transfers Overall transfer level: Needs assistance Equipment used: Rolling walker (2 wheeled) Transfers: Sit to/from Stand Sit to Stand: Mod assist         General transfer comment: max directional verbal and tactile cues to achieve stand  Ambulation/Gait Ambulation/Gait assistance: Mod assist Ambulation  Distance (Feet): 20 Feet Assistive device: Rolling walker (2 wheeled) Gait Pattern/deviations: Step-to pattern;Decreased stride length;Shuffle;Narrow base of support Gait velocity: slow Gait velocity interpretation: Below normal speed for age/gender General Gait Details: pt with mild retropulsion, modA for walker management as pt isn't used to walker  Stairs            Wheelchair Mobility    Modified Rankin (Stroke Patients Only)       Balance Overall balance assessment: Needs assistance Sitting-balance support: Feet supported;No upper extremity supported Sitting balance-Leahy Scale: Fair     Standing balance support: Bilateral upper extremity supported Standing balance-Leahy Scale: Poor Standing balance comment: requires physical assist                             Pertinent Vitals/Pain Pain Assessment: No/denies pain    Home Living Family/patient expects to be discharged to:: Private residence Living Arrangements: Spouse/significant other Available Help at Discharge: Family;Available 24 hours/day (talking about hiring private caretaker) Type of Home: House Home Access: Level entry     Home Layout: One level Home Equipment: Cane - single point      Prior Function Level of Independence: Needs assistance   Gait / Transfers Assistance Needed: pt was walking without AD however required physical assist over the last 2 weeks  ADL's / Homemaking Assistance Needed: pt starting to require assist with dressing and bathing        Hand Dominance   Dominant Hand: Right    Extremity/Trunk Assessment   Upper Extremity Assessment Upper Extremity Assessment: Generalized weakness    Lower Extremity Assessment Lower  Extremity Assessment: Generalized weakness    Cervical / Trunk Assessment Cervical / Trunk Assessment: Kyphotic  Communication   Communication: HOH  Cognition Arousal/Alertness: Lethargic Behavior During Therapy: Flat affect Overall  Cognitive Status: History of cognitive impairments - at baseline                                        General Comments General comments (skin integrity, edema, etc.): spoke extensively with family regarding how personal caretakers work and that it would be private pay    Exercises     Assessment/Plan    PT Assessment Patient needs continued PT services  PT Problem List Decreased strength;Decreased range of motion;Decreased activity tolerance;Decreased balance;Decreased mobility;Decreased coordination;Decreased cognition;Decreased knowledge of use of DME;Decreased safety awareness       PT Treatment Interventions DME instruction;Gait training;Stair training;Functional mobility training;Therapeutic activities;Therapeutic exercise;Balance training    PT Goals (Current goals can be found in the Care Plan section)  Acute Rehab PT Goals Patient Stated Goal: didn't state PT Goal Formulation: With patient Time For Goal Achievement: 01/31/17 Potential to Achieve Goals: Good    Frequency Min 3X/week   Barriers to discharge Decreased caregiver support daughter melody discussing hiring personal caregiver    Co-evaluation               AM-PAC PT "6 Clicks" Daily Activity  Outcome Measure Difficulty turning over in bed (including adjusting bedclothes, sheets and blankets)?: A Little Difficulty moving from lying on back to sitting on the side of the bed? : A Little Difficulty sitting down on and standing up from a chair with arms (e.g., wheelchair, bedside commode, etc,.)?: A Little Help needed moving to and from a bed to chair (including a wheelchair)?: A Little Help needed walking in hospital room?: A Lot Help needed climbing 3-5 steps with a railing? : A Lot 6 Click Score: 16    End of Session Equipment Utilized During Treatment: Gait belt Activity Tolerance: Patient tolerated treatment well Patient left: in chair;with call bell/phone within reach;with  family/visitor present (SLP present) Nurse Communication: Mobility status PT Visit Diagnosis: Unsteadiness on feet (R26.81);Muscle weakness (generalized) (M62.81);Difficulty in walking, not elsewhere classified (R26.2)    Time: 4166-0630 PT Time Calculation (min) (ACUTE ONLY): 25 min   Charges:   PT Evaluation $PT Eval Moderate Complexity: 1 Procedure PT Treatments $Therapeutic Activity: 8-22 mins   PT G Codes:        Kittie Plater, PT, DPT Pager #: [phone] Office #: [phone]   [place] 01/17/2017, 5:13 PM

## 2017-01-17 NOTE — ED Notes (Signed)
Bladder scan volume- 87 ml. MD notified

## 2017-01-17 NOTE — ED Notes (Signed)
Patient transported to CT 

## 2017-01-17 NOTE — Progress Notes (Signed)
Patient ID: Steven Forbes, male   DOB: 1926-03-29, 83 y.o.   MRN: [id number]  PROGRESS NOTE    Steven Forbes  OZH:[id number] DOB: [birth year] DOA: 01/16/2017 PCP: Precious Reel, MD    Brief Narrative:  83 y.o. male with history of dementia, ESRD on hemodialysis on Monday Wednesday Friday, hypertension, diabetes mellitus, chronic anemia, CAD who had new subclavian hemodialysis catheter placed day before yesterday was brought to the ER because of increasing lethargy and weakness over the last 3 days. He was admitted for probable pneumonia and started on broad spectrum antibiotics. His last dialysis was yesterday.  Assessment & Plan:   Principal Problem:   HCAP (healthcare-associated pneumonia) Active Problems:   ANEMIA   ESRD on dialysis [facility])   possible Pneumonia   CAD (coronary artery disease)   Essential hypertension   Aortic stenosis   Diabetes with neurologic complications ([place])    1. Healthcare associated pneumonia - Continue Vanc and Cefepime for now. Follow up cultures. Vanc dosing per pharmacy. Will get swallow evaluation. If symptoms does not improve may get MRI of the brain. 2. Probable metabolic encephalopathy: monitor mental status; fall precautions. PT/OT eval 3. ESRD on hemodialysis Monday Wednesday and Friday - Nephrology consult called. 4. Diabetes mellitus type 2 - patient is usually on insulin which family had not given last 2 days due to poor oral intake. Will keep patient on sliding-scale and closely follow CBGs. 5. Hypertension - on labetalol. We'll also keep patient on when necessary IV hydralazine until patient can swallow reliably. 6. Chronic anemia probably from ESRD - follow CBC. 7. History of CAD - has not complained of any chest pain.  DVT prophylaxis: Heparin. Code Status: Full code.  Family Communication: Daughter present at bedside  Disposition Plan: Home in 2-3 days Consultants:   Nephrology  Procedures:None Antimicrobials: Vanc & Cefepime  01/16/17>>  Subjective: Patient seen and examined at bedside. He wakes up slightly, answers very few questions. Then goes back to sleep.  Objective: Vitals:   01/17/17 0230 01/17/17 0345 01/17/17 0558 01/17/17 0849  BP: (!) 155/78 (!) 159/85 (!) 174/64   Pulse:  81 74 98  Resp: 14 18 18 17   Temp:   98.3 F (36.8 C)   TempSrc:   Oral   SpO2:  100% 100% 98%  Weight:   59.7 kg (131 lb 9.8 oz)   Height:   5\' 3"  (1.6 m)    No intake or output data in the 24 hours ending 01/17/17 0855 Filed Weights   01/17/17 0558  Weight: 59.7 kg (131 lb 9.8 oz)    Examination:  General exam: Appears calm and comfortable  Respiratory system: Bilateral decreased breath sounds at bases; few scattered crackles Cardiovascular system: S1 & S2 heard, RRR.  Gastrointestinal system: Abdomen is nondistended, soft and nontender. No organomegaly or masses felt. Normal bowel sounds heard. Central nervous system: Sleepy; wakes up slightly. No focal neurological deficits. Extremities: No cyanosis, clubbing, edema Skin: No rashes, lesions or ulcers    Data Reviewed: I have personally reviewed following labs and imaging studies  CBC:  Recent Labs Lab 01/16/17 2337 01/17/17 0617  WBC 6.3 6.7  NEUTROABS  --  4.0  HGB 11.6* 11.9*  HCT 35.0* 36.0*  MCV 87.5 88.9  PLT 213 96*   Basic Metabolic Panel:  Recent Labs Lab 01/17/17 0255 01/17/17 0617  NA 140 138  K 4.9 5.1  CL 111 103  CO2 24 27  GLUCOSE 65 69  BUN 12 14  CREATININE  3.89* 4.78*  CALCIUM 6.5* 7.9*   GFR: Estimated Creatinine Clearance: 8.6 mL/min (A) (by C-G formula based on SCr of 4.78 mg/dL (H)). Liver Function Tests:  Recent Labs Lab 01/17/17 0255 01/17/17 0617  AST 44* 46*  ALT 37 50  ALKPHOS 121 159*  BILITOT 0.8 1.0  PROT 4.6* 6.0*  ALBUMIN 1.7* 2.1*   No results for input(s): LIPASE, AMYLASE in the last 168 hours. No results for input(s): AMMONIA in the last 168 hours. Coagulation Profile: No results for  input(s): INR, PROTIME in the last 168 hours. Cardiac Enzymes:  Recent Labs Lab 01/17/17 0021  TROPONINI <0.03   BNP (last 3 results) No results for input(s): PROBNP in the last 8760 hours. HbA1C: No results for input(s): HGBA1C in the last 72 hours. CBG:  Recent Labs Lab 01/17/17 0000 01/17/17 0634  GLUCAP 77 202*   Lipid Profile: No results for input(s): CHOL, HDL, LDLCALC, TRIG, CHOLHDL, LDLDIRECT in the last 72 hours. Thyroid Function Tests: No results for input(s): TSH, T4TOTAL, FREET4, T3FREE, THYROIDAB in the last 72 hours. Anemia Panel: No results for input(s): VITAMINB12, FOLATE, FERRITIN, TIBC, IRON, RETICCTPCT in the last 72 hours. Sepsis Labs: No results for input(s): PROCALCITON, LATICACIDVEN in the last 168 hours.  No results found for this or any previous visit (from the past 240 hour(s)).       Radiology Studies: Dg Chest 2 View  Result Date: 01/17/2017 CLINICAL DATA:  Altered mental status with loss of appetite EXAM: CHEST  2 VIEW COMPARISON:  11/21/2013 FINDINGS: Right-sided dialysis catheter tip overlies the mid right atrium. There is mild cardiomegaly. Patchy posterior opacity, likely left lower lobe. No large effusion. Stable cardiomegaly with atherosclerosis. No pneumothorax. IMPRESSION: 1. Patchy posterior opacity, likely left base may reflect infiltrate. 2. Cardiomegaly. Electronically Signed   By: Donavan Foil M.D.   On: 01/17/2017 03:02   Ct Head Wo Contrast  Result Date: 01/17/2017 CLINICAL DATA:  Not eating well, altered mental status EXAM: CT HEAD WITHOUT CONTRAST TECHNIQUE: Contiguous axial images were obtained from the base of the skull through the vertex without intravenous contrast. COMPARISON:  12/02/2014 FINDINGS: Brain: No acute territorial infarction, hemorrhage or intracranial mass is visualized. Moderate periventricular and subcortical white matter hypodensity, consistent with small vessel ischemia, mildly progressed since the previous  exam. Small focal hypodensity in the right basal ganglia may reflect small age indeterminate lacunar infarct. Mild to moderate atrophy. Nonenlarged ventricles given the presence of atrophy. Vascular: No hyperdense vessels.  Carotid artery calcifications. Skull: Heterogenous mineralization of the skullbase and mandible. Sinuses/Orbits: Remodeling of the sinuses consistent with chronic sinus disease. Mucosal thickening within the ethmoid and sphenoid sinuses. 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 No acute orbital abnormality. Other: None IMPRESSION: 1. Negative for hemorrhage or focal mass lesion 2. Small focal hypodensity in the right basal ganglia may represent a age indeterminate lacunar infarct. 3. Moderate white matter small vessel ischemic changes, slightly progressed since 2015. Electronically Signed   By: Donavan Foil M.D.   On: 01/17/2017 03:08        Scheduled Meds: . aspirin EC  81 mg Oral Daily  . atorvastatin  80 mg Oral Daily  . calcium acetate  667 mg Oral TID WC  . heparin  5,000 Units Subcutaneous Q8H  . insulin aspart  0-9 Units Subcutaneous TID WC  . labetalol  300 mg Oral TID  . memantine  5 mg Oral Daily  . pantoprazole  40 mg Oral Daily  . risperiDONE  1 mg Oral QHS   Continuous Infusions: . ceFEPime (MAXIPIME) IV    . [START ON 01/18/2017] vancomycin       LOS: 0 days      Aline August, MD [facility] Pager [phone] If 7PM-7AM, please contact night-coverage [email or web address] Password [email or web address] 01/17/2017, 8:55 AM

## 2017-01-17 NOTE — ED Notes (Signed)
Pt's CBG checked and resulted in 77. Pt's wife expressed that was low for him and that she wasn't comfortable with that number. She said that it can be 250 before bed then she will check it at about 5am and it will drop down to 50. Blanch Media RN told

## 2017-01-17 NOTE — Progress Notes (Signed)
Pharmacy Antibiotic Note  Steven Forbes is a 83 y.o. male admitted on 01/16/2017 with pneumonia.  Pharmacy has been consulted for Vancomycin dosing. CXR with infiltrate. WBC WNL. ESRD on HD MWF.  Plan: -Vancomycin 1250 mg IV x 1, then 750 mg IV qHD MWF -Cefepime 2g IV x 1 ordered, f/u additional gram negative coverage Trend WBC, temp, HD schedule F/U infectious work-up Drug levels as indicated  Temp (24hrs), Avg:97.9 F (36.6 C), Min:97.9 F (36.6 C), Max:97.9 F (36.6 C)   Recent Labs Lab 01/16/17 2337 01/17/17 0255  WBC 6.3  --   CREATININE  --  3.89*    CrCl cannot be calculated (Unknown ideal weight.).    No Known Allergies  Steven Forbes 01/17/2017 3:51 AM

## 2017-01-17 NOTE — ED Notes (Signed)
Report called to 2W

## 2017-01-17 NOTE — Consult Note (Signed)
[facility] Renal Consultation Note    Indication for Consultation:  Management of ESRD/hemodialysis; anemia, hypertension/volume and secondary hyperparathyroidism PCP: Precious Reel, MD   HPI: Steven Forbes is a 83 y.o. male with ESRD on hemodialysis MWF at [facility]. PMH significant for DMT2, HTN, HOH, HLD, dementia, SHPT, anemia of chronic disease. H/O R transmet amputation 07/19/11.   Patient thrombosed LUA AVF access 01/13/17. He had catheter placed 01/16/17 per Dr. Augustin Coupe at [facility]. Per son-in-law, patient appeared "to be slowing down" over weekend, not moving around as much as usual with poor appetite. Yesterday after [facility] insertion, patient became lethargic and family called 911. Paramedics administered IVF to patient with some improvement in lethargy. Later in the evening, patient appeared to become less responsive and 911 was called again, this time transporting patient to ED for evaluation of increasing lethargy, weakness, poor appetite. T-97.9 BP 148/67 HR 90 RR 14. WBC 6.7 HGB 11.9 HCT 36 PLT 96 K+ 5.1 Na 138 Co2 27 Albumin 2.1CXR showed Patchy posterior opacity left lower lobe. He has been started on vancomycin and cefepime per primary.   Last hemodialysis 01/16/17. Ran full treatment. Has shown signs of worsening dementia in HD over past year, at times trying to leave HD before HD is over, other times totally passive, knows his name and his wife only. He is compliant with HD prescription, rarely missed treatments. Anemia and BMD labs at goal.   Past Medical History:  Diagnosis Date  . Anemia   . Asthma   . Dementia   . Diabetes mellitus    type 2  . ESRD (end stage renal disease) ([place])    Dialysis on MONDAY, [place] and FRIDAYS  . Flu 08/2014  . GERD (gastroesophageal reflux disease)   . History of blood transfusion   . HOH (hard of hearing)   . Hx of echocardiogram    a.  Echocardiogram (09/10/2013): EF 50-55%, normal wall motion, grade 2  diastolic dysfunction, mild aortic stenosis, trivial AI, MAC, moderate MR, mild LAE, moderate to severe TR, moderately increased PASP.  [name] Hx of non-ST elevation myocardial infarction (NSTEMI)    a. in setting of influenza, volume overload 08/2013 => Lexiscan Myoview (09/11/2013): Subtle ischemia in the anteroseptal wall, EF 44%. => med Rx recommended  . Hyperlipidemia   . Hypertension   . Shortness of breath    with exertion   Past Surgical History:  Procedure Laterality Date  . AV FISTULA PLACEMENT  2008   Left Upper Arm  . COLONOSCOPY    . EYE SURGERY Bilateral    cataract surgery  . LAPAROSCOPIC GASTROTOMY W/ REPAIR OF ULCER    . REVISON OF ARTERIOVENOUS FISTULA Left 11/21/2013   Procedure: REVISION OF ARTERIOVENOUS FISTULA;  Surgeon: Angelia Mould, MD;  Location: [place];  Service: Vascular;  Laterality: Left;  . REVISON OF ARTERIOVENOUS FISTULA Left 02/18/2016   Procedure: excision of ulcerated skin over left brachio-cephalic AV fistula x 2;  Surgeon: [name] Misty, MD;  Location: [place];  Service: Vascular;  Laterality: Left;  . TOE AMPUTATION Right    all toes   Family History  Problem Relation Age of Onset  . Cancer Mother   . Hypertension Mother   . Cancer Father   . Diabetes Father   . Hypertension Father   . Diabetes Son   . Diabetes Daughter   . Diabetes Daughter   . Diabetes Cousin    Social History:  reports that he has never  smoked. He has never used smokeless tobacco. He reports that he does not drink alcohol or use drugs. No Known Allergies Prior to Admission medications   Medication Sig Start Date End Date Taking? Authorizing Provider  ACCU-CHEK AVIVA PLUS test strip  08/02/16   Historical Provider, MD  ACCU-CHEK SOFTCLIX LANCETS lancets  08/24/16   Historical Provider, MD  amLODipine (NORVASC) 5 MG tablet Take 5 mg by mouth daily. Reported on 03/15/2016 09/28/13   Historical Provider, MD  atorvastatin (LIPITOR) 80 MG tablet Take 80 mg by mouth daily.   09/23/13   Historical Provider, MD  calcium acetate (PHOSLO) 667 MG capsule  07/10/16   Historical Provider, MD  [facility] ASPIRIN LOW DOSE 81 MG EC tablet TAKE 1 TABLET (81 MG TOTAL) BY MOUTH DAILY. NEED OV. 11/28/16   Lelon Perla, MD  darbepoetin (ARANESP) 100 MCG/0.5ML SOLN injection Inject 0.5 mLs (100 mcg total) into the vein every Monday with hemodialysis. 09/13/13   Charlynne Cousins, MD  Insulin Isophane Human (HUMULIN N PEN [place]) Inject 5-10 Units into the skin 2 (two) times daily.     Historical Provider, MD  labetalol (NORMODYNE) 300 MG tablet Take 300 mg by mouth 3 (three) times daily.     Historical Provider, MD  memantine (NAMENDA) 5 MG tablet Take 5 mg by mouth daily.  12/23/14   Historical Provider, MD  omeprazole (PRILOSEC) 20 MG capsule Take 20 mg by mouth daily.      Historical Provider, MD  oxyCODONE-acetaminophen (ROXICET) 5-325 MG tablet Take 1 tablet by mouth every 8 (eight) hours as needed for moderate pain or severe pain. 02/18/16   Samantha J Rhyne, PA-C  PROAIR HFA 108 (90 Base) MCG/ACT inhaler Inhale 2 puffs into the lungs 3 (three) times daily.  12/14/15   Historical Provider, MD  risperiDONE (RISPERDAL) 0.5 MG tablet Take 1 mg by mouth at bedtime.  01/13/15   Historical Provider, MD   Current Facility-Administered Medications  Medication Dose Route Frequency Provider Last Rate Last Dose  . acetaminophen (TYLENOL) tablet 650 mg  650 mg Oral Q6H PRN Rise Patience, MD       Or  . acetaminophen (TYLENOL) suppository 650 mg  650 mg Rectal Q6H PRN Rise Patience, MD      . albuterol (PROVENTIL) (2.5 MG/3ML) 0.083% nebulizer solution 3 mL  3 mL Inhalation Q4H PRN Kshitiz Alekh, MD      . aspirin EC tablet 81 mg  81 mg Oral Daily Rise Patience, MD   81 mg at 01/17/17 1037  . atorvastatin (LIPITOR) tablet 80 mg  80 mg Oral Daily Rise Patience, MD   80 mg at 01/17/17 1037  . calcium acetate (PHOSLO) capsule 667 mg  667 mg Oral TID WC Rise Patience, MD       . ceFEPIme (MAXIPIME) 1 g in dextrose 5 % 50 mL IVPB  1 g Intravenous Q24H Rise Patience, MD      . heparin injection 5,000 Units  5,000 Units Subcutaneous Q8H Rise Patience, MD   5,000 Units at 01/17/17 0616  . hydrALAZINE (APRESOLINE) injection 10 mg  10 mg Intravenous Q4H PRN Rise Patience, MD   10 mg at 01/17/17 0616  . insulin aspart (novoLOG) injection 0-9 Units  0-9 Units Subcutaneous TID WC Rise Patience, MD      . labetalol (NORMODYNE) tablet 300 mg  300 mg Oral TID Rise Patience, MD   300 mg at 01/17/17  1037  . memantine (NAMENDA) tablet 5 mg  5 mg Oral Daily Rise Patience, MD   5 mg at 01/17/17 1037  . oxyCODONE-acetaminophen (PERCOCET/ROXICET) 5-325 MG per tablet 1 tablet  1 tablet Oral Q8H PRN Rise Patience, MD      . pantoprazole (PROTONIX) EC tablet 40 mg  40 mg Oral Daily Rise Patience, MD   40 mg at 01/17/17 1037  . risperiDONE (RISPERDAL) tablet 1 mg  1 mg Oral QHS Rise Patience, MD      . Derrill Memo ON 01/18/2017] vancomycin (VANCOCIN) IVPB 750 mg/150 ml premix  750 mg Intravenous Q M,W,F-HD Erenest Blank, [facility]       Labs: Basic Metabolic Panel:  Recent Labs Lab 01/17/17 0255 01/17/17 0617  NA 140 138  K 4.9 5.1  CL 111 103  CO2 24 27  GLUCOSE 65 69  BUN 12 14  CREATININE 3.89* 4.78*  CALCIUM 6.5* 7.9*   Liver Function Tests:  Recent Labs Lab 01/17/17 0255 01/17/17 0617  AST 44* 46*  ALT 37 50  ALKPHOS 121 159*  BILITOT 0.8 1.0  PROT 4.6* 6.0*  ALBUMIN 1.7* 2.1*   No results for input(s): LIPASE, AMYLASE in the last 168 hours. No results for input(s): AMMONIA in the last 168 hours. CBC:  Recent Labs Lab 01/16/17 2337 01/17/17 0617  WBC 6.3 6.7  NEUTROABS  --  4.0  HGB 11.6* 11.9*  HCT 35.0* 36.0*  MCV 87.5 88.9  PLT 213 96*   Cardiac Enzymes:  Recent Labs Lab 01/17/17 0021  TROPONINI <0.03   CBG:  Recent Labs Lab 01/17/17 0000 01/17/17 0634 01/17/17 1107  GLUCAP 77 202* 78    Iron Studies: No results for input(s): IRON, TIBC, TRANSFERRIN, FERRITIN in the last 72 hours. Studies/Results: Dg Chest 2 View  Result Date: 01/17/2017 CLINICAL DATA:  Altered mental status with loss of appetite EXAM: CHEST  2 VIEW COMPARISON:  11/21/2013 FINDINGS: Right-sided dialysis catheter tip overlies the mid right atrium. There is mild cardiomegaly. Patchy posterior opacity, likely left lower lobe. No large effusion. Stable cardiomegaly with atherosclerosis. No pneumothorax. IMPRESSION: 1. Patchy posterior opacity, likely left base may reflect infiltrate. 2. Cardiomegaly. Electronically Signed   By: Donavan Foil M.D.   On: 01/17/2017 03:02   Ct Head Wo Contrast  Result Date: 01/17/2017 CLINICAL DATA:  Not eating well, altered mental status EXAM: CT HEAD WITHOUT CONTRAST TECHNIQUE: Contiguous axial images were obtained from the base of the skull through the vertex without intravenous contrast. COMPARISON:  12/02/2014 FINDINGS: Brain: No acute territorial infarction, hemorrhage or intracranial mass is visualized. Moderate periventricular and subcortical white matter hypodensity, consistent with small vessel ischemia, mildly progressed since the previous exam. Small focal hypodensity in the right basal ganglia may reflect small age indeterminate lacunar infarct. Mild to moderate atrophy. Nonenlarged ventricles given the presence of atrophy. Vascular: No hyperdense vessels.  Carotid artery calcifications. Skull: Heterogenous mineralization of the skullbase and mandible. Sinuses/Orbits: Remodeling of the sinuses consistent with chronic sinus disease. Mucosal thickening within the ethmoid and sphenoid sinuses. 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 No acute orbital abnormality. Other: None IMPRESSION: 1. Negative for hemorrhage or focal mass lesion 2. Small focal hypodensity in the right basal ganglia may represent a age indeterminate lacunar infarct. 3. Moderate white matter small vessel ischemic changes,  slightly progressed since 2015. Electronically Signed   By: Donavan Foil M.D.   On: 01/17/2017 03:08    ROS: As per HPI otherwise negative.  Physical Exam: Vitals:   01/17/17 0230 01/17/17 0345 01/17/17 0558 01/17/17 0849  BP: (!) 155/78 (!) 159/85 (!) 174/64   Pulse:  81 74 98  Resp: _0 Temp:   98.3 F (36.8 C)   TempSrc:   Oral   SpO2:  100% 100% 98%  Weight:   59.7 kg (131 lb 9.8 oz)   Height:   _1  (1.6 m)      General: Elderly AA male in NAD.  Head: Normocephalic, atraumatic, sclera non-icteric, mucus membranes are moist Neck: Supple. Slight JVD @ 30 degrees. . Lungs: BBS with fine bibasilar crackles. No WOB. On RA.  Heart: RRR with S1 S2. 2/6 systolic M. SR on monitor.  Abdomen: Soft, non-tender, non-distended with normoactive bowel sounds. No rebound/guarding. No obvious abdominal masses. M-S:  Strength and tone appear normal for age. Lower extremities: BLE 2+ pitting edema R>L. R. Transmet amputation.  Neuro: Alert and oriented X 1. Moves all extremities spontaneously. Psych:  Responds to questions slowly. Mostly just smiles when asked questions. Nods affirmatively to all questions.  Dialysis Access: LUA AVF-thrombosed. RIJ TDC Drsg intact.    Dialysis Orders: [facility] MWF 4 hrs 180 NRe 61 kg 400/Auto 1.5 UF Profile 4 Linear Na 3.0 K/2.25 Ca  Heparin 5000 units IV TIW Hectorol 3 mcg IV TIW Venofer 50 mg IV q weekly (last dose 01/06/17 Fe 123 Ferritin 1173 01/11/17)  BMD meds:  Calcium acetate 667 mg PO TID AC (Last phos 3.1 Ca 8.5 C Ca 9.5 PTH 151)  Assessment/Plan: 1.  HCAP: Vanc and cefepime per primary.  2.  ESRD - MWF. Next HD tomorrow on schedule K+5.1  3.  Hypertension/volume  -Labetolol 300 mg PO ordered TID. Has been hypertensive since adm. Wt 59.7 slightly below EDW.  4.  Anemia  -HGB 11.9 No ESA needed. Hold weekly Fe.  5.  Metabolic bone disease -  Check renal function panel. Cont binders/VDRA. HD tomorrow. Re-weigh pt-2.3 liters UFG. Has LE  edema. Did receive IVF per paramedics, quantity not documented.  6.  Nutrition - Albumin 2.1. NPO at present. Plan for swallowing exam.  7. Dementia - recent onset over last 1 year  Rita H. Owens Shark, NP-C 01/17/2017, 12:02 PM  [employer] [phone]  Pt seen, examined, agree w assess/plan as above with additions as indicated.  Kelly Splinter MD [employer] pager [phone]    cell [phone] 01/17/2017, 4:20 PM

## 2017-01-18 DIAGNOSIS — G9340 Encephalopathy, unspecified: Secondary | ICD-10-CM

## 2017-01-18 DIAGNOSIS — I2510 Atherosclerotic heart disease of native coronary artery without angina pectoris: Secondary | ICD-10-CM

## 2017-01-18 DIAGNOSIS — J189 Pneumonia, unspecified organism: Principal | ICD-10-CM

## 2017-01-18 LAB — BASIC METABOLIC PANEL
ANION GAP: 8 (ref 5–15)
BUN: 19 mg/dL (ref 6–20)
CALCIUM: 8 mg/dL — AB (ref 8.9–10.3)
CO2: 24 mmol/L (ref 22–32)
CREATININE: 6.07 mg/dL — AB (ref 0.61–1.24)
Chloride: 105 mmol/L (ref 101–111)
GFR calc Af Amer: 9 mL/min — ABNORMAL LOW (ref >60)
GFR, EST NON AFRICAN AMERICAN: 7 mL/min — AB (ref >60)
GLUCOSE: 79 mg/dL (ref 65–99)
Potassium: 4.7 mmol/L (ref 3.5–5.1)
Sodium: 137 mmol/L (ref 135–145)

## 2017-01-18 LAB — RENAL FUNCTION PANEL
Albumin: 1.8 g/dL — ABNORMAL LOW (ref 3.5–5.0)
Anion gap: 7 (ref 5–15)
BUN: <5 mg/dL — ABNORMAL LOW (ref 6–20)
CO2: 26 mmol/L (ref 22–32)
Calcium: 7.3 mg/dL — ABNORMAL LOW (ref 8.9–10.3)
Chloride: 99 mmol/L — ABNORMAL LOW (ref 101–111)
Creatinine, Ser: 2.55 mg/dL — ABNORMAL HIGH (ref 0.61–1.24)
GFR calc Af Amer: 24 mL/min — ABNORMAL LOW (ref >60)
GFR calc non Af Amer: 21 mL/min — ABNORMAL LOW (ref >60)
Glucose, Bld: 71 mg/dL (ref 65–99)
Phosphorus: 1.9 mg/dL — ABNORMAL LOW (ref 2.5–4.6)
Potassium: 3.3 mmol/L — ABNORMAL LOW (ref 3.5–5.1)
Sodium: 132 mmol/L — ABNORMAL LOW (ref 135–145)

## 2017-01-18 LAB — CBC WITH DIFFERENTIAL/PLATELET
BASOS ABS: 0 10*3/uL (ref 0.0–0.1)
BASOS PCT: 0 %
EOS PCT: 1 %
Eosinophils Absolute: 0.1 10*3/uL (ref 0.0–0.7)
HCT: 32 % — ABNORMAL LOW (ref 39.0–52.0)
Hemoglobin: 10.8 g/dL — ABNORMAL LOW (ref 13.0–17.0)
Lymphocytes Relative: 20 %
Lymphs Abs: 1.5 10*3/uL (ref 0.7–4.0)
MCH: 29.5 pg (ref 26.0–34.0)
MCHC: 33.8 g/dL (ref 30.0–36.0)
MCV: 87.4 fL (ref 78.0–100.0)
MONO ABS: 0.9 10*3/uL (ref 0.1–1.0)
MONOS PCT: 11 %
Neutro Abs: 5.1 10*3/uL (ref 1.7–7.7)
Neutrophils Relative %: 68 %
PLATELETS: 102 10*3/uL — AB (ref 150–400)
RBC: 3.66 MIL/uL — ABNORMAL LOW (ref 4.22–5.81)
RDW: 16.3 % — AB (ref 11.5–15.5)
WBC: 7.5 10*3/uL (ref 4.0–10.5)

## 2017-01-18 LAB — GLUCOSE, CAPILLARY
GLUCOSE-CAPILLARY: 92 mg/dL (ref 65–99)
GLUCOSE-CAPILLARY: 93 mg/dL (ref 65–99)
GLUCOSE-CAPILLARY: 79 mg/dL (ref 65–99)
GLUCOSE-CAPILLARY: 70 mg/dL (ref 65–99)
Glucose-Capillary: 64 mg/dL — ABNORMAL LOW (ref 65–99)
Glucose-Capillary: 135 mg/dL — ABNORMAL HIGH (ref 65–99)
Glucose-Capillary: 68 mg/dL (ref 65–99)
Glucose-Capillary: 66 mg/dL (ref 65–99)
Glucose-Capillary: 61 mg/dL — ABNORMAL LOW (ref 65–99)
Glucose-Capillary: 142 mg/dL — ABNORMAL HIGH (ref 65–99)

## 2017-01-18 LAB — CBC
HCT: 32.2 % — ABNORMAL LOW (ref 39.0–52.0)
Hemoglobin: 10.7 g/dL — ABNORMAL LOW (ref 13.0–17.0)
MCH: 28.9 pg (ref 26.0–34.0)
MCHC: 33.2 g/dL (ref 30.0–36.0)
MCV: 87 fL (ref 78.0–100.0)
Platelets: 117 K/uL — ABNORMAL LOW (ref 150–400)
RBC: 3.7 MIL/uL — ABNORMAL LOW (ref 4.22–5.81)
RDW: 15.7 % — ABNORMAL HIGH (ref 11.5–15.5)
WBC: 8.6 K/uL (ref 4.0–10.5)

## 2017-01-18 LAB — AMMONIA: Ammonia: 35 umol/L (ref 9–35)

## 2017-01-18 LAB — HEPATITIS B SURFACE ANTIGEN: Hepatitis B Surface Ag: NEGATIVE

## 2017-01-18 MED ORDER — AMOXICILLIN-POT CLAVULANATE 500-125 MG PO TABS
1.0000 | ORAL_TABLET | Freq: Every day | ORAL | Status: DC
  Administered 2017-01-19: 500 mg via ORAL
  Filled 2017-01-18 (×2): qty 1

## 2017-01-18 MED ORDER — PENTAFLUOROPROP-TETRAFLUOROETH EX AERO: 1.0000 "application " | INHALATION_SPRAY | CUTANEOUS | Status: DC | PRN

## 2017-01-18 MED ORDER — LIDOCAINE-PRILOCAINE 2.5-2.5 % EX CREA: 1.0000 "application " | TOPICAL_CREAM | CUTANEOUS | Status: DC | PRN

## 2017-01-18 MED ORDER — DEXTROSE 50 % IV SOLN
25.0000 mL | Freq: Once | INTRAVENOUS | Status: AC
  Administered 2017-01-17: 50 mL via INTRAVENOUS
  Administered 2017-01-18: 25 mL via INTRAVENOUS

## 2017-01-18 MED ORDER — HEPARIN SODIUM (PORCINE) 1000 UNIT/ML DIALYSIS
5000.0000 [IU] | Freq: Once | INTRAMUSCULAR | Status: DC
  Filled 2017-01-18: qty 5

## 2017-01-18 MED ORDER — LIDOCAINE HCL (PF) 1 % IJ SOLN: 5.0000 mL | INTRAMUSCULAR | Status: DC | PRN

## 2017-01-18 MED ORDER — DEXTROSE 50 % IV SOLN
INTRAVENOUS | Status: AC
  Administered 2017-01-18: 25 mL
  Filled 2017-01-18: qty 50

## 2017-01-18 MED ORDER — DOXERCALCIFEROL 4 MCG/2ML IV SOLN
INTRAVENOUS | Status: AC
  Administered 2017-01-18: 3 ug via INTRAVENOUS
  Filled 2017-01-18: qty 2

## 2017-01-18 MED ORDER — AMOXICILLIN-POT CLAVULANATE 500-125 MG PO TABS
1.0000 | ORAL_TABLET | ORAL | Status: DC
  Administered 2017-01-18: 500 mg via ORAL
  Filled 2017-01-18: qty 1

## 2017-01-18 MED ORDER — DEXTROSE 50 % IV SOLN
INTRAVENOUS | Status: AC
  Administered 2017-01-18: 25 mL via INTRAVENOUS
  Filled 2017-01-18: qty 50

## 2017-01-18 MED ORDER — SODIUM CHLORIDE 0.9 % IV SOLN: 100.0000 mL | INTRAVENOUS | Status: DC | PRN

## 2017-01-18 MED ORDER — ALTEPLASE 2 MG IJ SOLR: 2.0000 mg | Freq: Once | INTRAMUSCULAR | Status: DC | PRN

## 2017-01-18 MED ORDER — HEPARIN SODIUM (PORCINE) 1000 UNIT/ML DIALYSIS: 1000.0000 [IU] | INTRAMUSCULAR | Status: DC | PRN

## 2017-01-18 NOTE — Progress Notes (Signed)
Hypoglycemic Event  CBG: 68  Treatment: D50 IV 25 mL  Symptoms: None  Follow-up CBG: Time: 0628 CBG Result:135  Possible Reasons for Event: Inadequate meal intake  Comments/MD notified:n/a    Steven Forbes

## 2017-01-18 NOTE — Progress Notes (Signed)
Patient ID: Steven Forbes, male   DOB: 1926-09-13, 83 y.o.   MRN: [id number]  PROGRESS NOTE    Quaran Kedzierski  ZCH:[id number] DOB: 03/09/1926 DOA: 01/16/2017 PCP: Precious Reel, MD    Brief Narrative:  83 y.o. male with ESRD on hemodialysis MWF at [facility]. PMH significant for DMT2, HTN, HOH, HLD, dementia, SHPT, anemia of chronic disease. H/O R transmet amputation 07/19/11.   Patient thrombosed LUA AVF access 01/13/17. He had catheter placed 01/16/17 per Dr. Augustin Coupe at [facility]. Per son-in-law, patient appeared "to be slowing down" over weekend, not moving around as much as usual with poor appetite. Yesterday after [facility] insertion, patient became lethargic and family called 911. Paramedics administered IVF to patient with some improvement in lethargy. Later in the evening, patient appeared to become less responsive and 911 was called again, this time transporting patient to ED for evaluation of increasing lethargy, weakness, poor appetite. Patient found to have pneumonia  Assessment & Plan:   Principal Problem:   HCAP (healthcare-associated pneumonia) Active Problems:   ANEMIA   ESRD on dialysis [facility])   possible Pneumonia   CAD (coronary artery disease)   Essential hypertension   Aortic stenosis   Diabetes with neurologic complications ([place])   Acute encephalopathy    1. Healthcare associated pneumonia - patient has received Vanc and Cefepime since 4/30. Will change to Augmentin. Blood culture no growth so far. MRSA PCR negative. Dysphagia 1 diet. If symptoms does not improve may get MRI of the brain. 2. Probable metabolic encephalopathy: CT head negative for hemorrhage or focal mass lesion. monitor mental status; fall precautions. PT/OT eval. Check ammonia level 3. ESRD on hemodialysis Monday Wednesday and Friday - Nephrology following 4. Diabetes mellitus type 2 - patient is usually on insulin which family had not given last 2 days due to poor oral intake. Will keep patient  on sliding-scale and Accu-Chek showed that the patient CBG has been low likely the setting of pneumonia. Continue D10 5. Hypertension - on labetalol. We'll also keep patient on when necessary IV hydralazine prn. 6. Chronic anemia probably from ESRD - follow CBC. 7. History of CAD - has not complained of any chest pain.  DVT prophylaxis: Heparin. Code Status: Full code.  Family Communication: Daughter present at bedside  Disposition Plan: Home in 2-3 days with home health Consultants:   Nephrology  Procedures:None Antimicrobials: Vanc & Cefepime 01/16/17>>5/1 Augmentin 5/2  Subjective:  Patient noted to be hypoglycemic yesterday and overnight placed on D10  Objective: Vitals:   01/17/17 0849 01/17/17 2000 01/17/17 2120 01/18/17 0430  BP:  (!) 143/61 (!) 142/62 (!) 158/67  Pulse: 98 77 77 79  Resp: 17 18  16   Temp:  98.4 F (36.9 C)  98.2 F (36.8 C)  TempSrc:  Oral  Oral  SpO2: 98% 100%  100%  Weight:    60 kg (132 lb 4.8 oz)  Height:        Intake/Output Summary (Last 24 hours) at 01/18/17 0822 Last data filed at 01/18/17 0550  Gross per 24 hour  Intake           438.09 ml  Output                1 ml  Net           437.09 ml   Filed Weights   01/17/17 0558 01/18/17 0430  Weight: 59.7 kg (131 lb 9.8 oz) 60 kg (132 lb 4.8 oz)    Examination:  General exam: Appears calm and comfortable  Respiratory system: Bilateral decreased breath sounds at bases; few scattered crackles Cardiovascular system: S1 & S2 heard, RRR.  Gastrointestinal system: Abdomen is nondistended, soft and nontender. No organomegaly or masses felt. Normal bowel sounds heard. Central nervous system: Sleepy; wakes up slightly. No focal neurological deficits. Extremities: No cyanosis, clubbing, edema Skin: No rashes, lesions or ulcers    Data Reviewed: I have personally reviewed following labs and imaging studies  CBC:  Recent Labs Lab 01/16/17 2337 01/17/17 0617 01/18/17 0443  WBC 6.3  6.7 7.5  NEUTROABS  --  4.0 5.1  HGB 11.6* 11.9* 10.8*  HCT 35.0* 36.0* 32.0*  MCV 87.5 88.9 87.4  PLT 213 96* 194*   Basic Metabolic Panel:  Recent Labs Lab 01/17/17 0255 01/17/17 0617 01/18/17 0443  NA 140 138 137  K 4.9 5.1 4.7  CL 111 103 105  CO2 24 27 24   GLUCOSE 65 69 79  BUN 12 14 19   CREATININE 3.89* 4.78* 6.07*  CALCIUM 6.5* 7.9* 8.0*   GFR: Estimated Creatinine Clearance: 6.8 mL/min (A) (by C-G formula based on SCr of 6.07 mg/dL (H)). Liver Function Tests:  Recent Labs Lab 01/17/17 0255 01/17/17 0617  AST 44* 46*  ALT 37 50  ALKPHOS 121 159*  BILITOT 0.8 1.0  PROT 4.6* 6.0*  ALBUMIN 1.7* 2.1*   No results for input(s): LIPASE, AMYLASE in the last 168 hours. No results for input(s): AMMONIA in the last 168 hours. Coagulation Profile: No results for input(s): INR, PROTIME in the last 168 hours. Cardiac Enzymes:  Recent Labs Lab 01/17/17 0021  TROPONINI <0.03   BNP (last 3 results) No results for input(s): PROBNP in the last 8760 hours. HbA1C: No results for input(s): HGBA1C in the last 72 hours. CBG:  Recent Labs Lab 01/18/17 0101 01/18/17 0120 01/18/17 0440 01/18/17 0556 01/18/17 0628  GLUCAP 93 92 79 68 135*   Lipid Profile: No results for input(s): CHOL, HDL, LDLCALC, TRIG, CHOLHDL, LDLDIRECT in the last 72 hours. Thyroid Function Tests: No results for input(s): TSH, T4TOTAL, FREET4, T3FREE, THYROIDAB in the last 72 hours. Anemia Panel: No results for input(s): VITAMINB12, FOLATE, FERRITIN, TIBC, IRON, RETICCTPCT in the last 72 hours. Sepsis Labs: No results for input(s): PROCALCITON, LATICACIDVEN in the last 168 hours.  Recent Results (from the past 240 hour(s))  Surgical PCR screen     Status: None   Collection Time: 01/17/17  7:00 AM  Result Value Ref Range Status   MRSA, PCR NEGATIVE NEGATIVE Final   Staphylococcus aureus NEGATIVE NEGATIVE Final    Comment:        The Xpert SA Assay (FDA approved for NASAL specimens in  patients over 3 years of age), is one component of a comprehensive surveillance program.  Test performance has been validated by [facility] for patients greater than or equal to 22 year old. It is not intended to diagnose infection nor to guide or monitor treatment.          Radiology Studies: Dg Chest 2 View  Result Date: 01/17/2017 CLINICAL DATA:  Altered mental status with loss of appetite EXAM: CHEST  2 VIEW COMPARISON:  11/21/2013 FINDINGS: Right-sided dialysis catheter tip overlies the mid right atrium. There is mild cardiomegaly. Patchy posterior opacity, likely left lower lobe. No large effusion. Stable cardiomegaly with atherosclerosis. No pneumothorax. IMPRESSION: 1. Patchy posterior opacity, likely left base may reflect infiltrate. 2. Cardiomegaly. Electronically Signed   By: Madie Reno.D.  On: 01/17/2017 03:02   Ct Head Wo Contrast  Result Date: 01/17/2017 CLINICAL DATA:  Not eating well, altered mental status EXAM: CT HEAD WITHOUT CONTRAST TECHNIQUE: Contiguous axial images were obtained from the base of the skull through the vertex without intravenous contrast. COMPARISON:  12/02/2014 FINDINGS: Brain: No acute territorial infarction, hemorrhage or intracranial mass is visualized. Moderate periventricular and subcortical white matter hypodensity, consistent with small vessel ischemia, mildly progressed since the previous exam. Small focal hypodensity in the right basal ganglia may reflect small age indeterminate lacunar infarct. Mild to moderate atrophy. Nonenlarged ventricles given the presence of atrophy. Vascular: No hyperdense vessels.  Carotid artery calcifications. Skull: Heterogenous mineralization of the skullbase and mandible. Sinuses/Orbits: Remodeling of the sinuses consistent with chronic sinus disease. Mucosal thickening within the ethmoid and sphenoid sinuses. 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 No acute orbital abnormality. Other: None IMPRESSION: 1. Negative  for hemorrhage or focal mass lesion 2. Small focal hypodensity in the right basal ganglia may represent a age indeterminate lacunar infarct. 3. Moderate white matter small vessel ischemic changes, slightly progressed since 2015. Electronically Signed   By: Donavan Foil M.D.   On: 01/17/2017 03:08        Scheduled Meds: . aspirin EC  81 mg Oral Daily  . atorvastatin  80 mg Oral Daily  . calcium acetate  667 mg Oral TID WC  . doxercalciferol  3 mcg Intravenous Q M,W,F-HD  . heparin  5,000 Units Subcutaneous Q8H  . insulin aspart  0-9 Units Subcutaneous Q4H  . labetalol  300 mg Oral TID  . memantine  5 mg Oral Daily  . pantoprazole  40 mg Oral Daily  . risperiDONE  1 mg Oral QHS   Continuous Infusions: . ceFEPime (MAXIPIME) IV Stopped (01/17/17 2152)  . dextrose 35 mL/hr at 01/17/17 2119  . vancomycin       LOS: 1 day      Reyne Dumas, MD [facility] Pager [phone] If 7PM-7AM, please contact night-coverage [email or web address] Password [email or web address] 01/18/2017, 8:22 AM

## 2017-01-18 NOTE — Progress Notes (Signed)
SLP Cancellation Note  Patient Details Name: Varnell Orvis MRN: [id number] DOB: 1925/11/03   Cancelled treatment:       Reason Eval/Treat Not Completed: Patient at procedure or test/unavailable   Juan Quam Laurice 01/18/2017, 10:50 AM

## 2017-01-18 NOTE — Progress Notes (Signed)
Hypoglycemic Event  CBG: 66 (2000); 61 (2225)  Treatment: 46mL D50 IV  Symptoms: none  Follow-up CBG: Time:142 CBG Result:142  Possible Reasons for Event: poor PO intake, ordered D5 infusion paused d/t loss of IV access  Comments/MD notified: [facility]

## 2017-01-18 NOTE — Progress Notes (Signed)
[facility] Progress Note   Subjective: no c/o's today  Vitals:   01/18/17 0955 01/18/17 1005 01/18/17 1030 01/18/17 1100  BP: (!) 163/74 (!) 154/73 136/68 105/68  Pulse: 77 76 77 70  Resp: 17     Temp: 98.9 F (37.2 C)     TempSrc: Oral     SpO2: 98%     Weight: 61.3 kg (135 lb 2.3 oz)     Height:        Inpatient medications: . amoxicillin-clavulanate  1 tablet Oral Daily  . amoxicillin-clavulanate  1 tablet Oral Q M,W,F-1800  . aspirin EC  81 mg Oral Daily  . atorvastatin  80 mg Oral Daily  . calcium acetate  667 mg Oral TID WC  . doxercalciferol  3 mcg Intravenous Q M,W,F-HD  . heparin  5,000 Units Subcutaneous Q8H  . heparin  5,000 Units Dialysis Once in dialysis  . insulin aspart  0-9 Units Subcutaneous Q4H  . labetalol  300 mg Oral TID  . memantine  5 mg Oral Daily  . pantoprazole  40 mg Oral Daily  . risperiDONE  1 mg Oral QHS   . sodium chloride    . sodium chloride    . dextrose 10 mL/hr at 01/18/17 0846   sodium chloride, sodium chloride, acetaminophen **OR** acetaminophen, albuterol, alteplase, heparin, hydrALAZINE, lidocaine (PF), lidocaine-prilocaine, oxyCODONE-acetaminophen, pentafluoroprop-tetrafluoroeth  Exam: Confused, not responding to questions No jvd Chest clear bilat , poor cooperation though RRR no mrg ABd soft ntnd no ascites Ext 1-2+ pitting edema bilat pretib/ pedal Neuro - won't respond to voice, not talking  Dialysis: [facility] 4h   61kg   400/A1.5   PF  3K/2/25 bath  Hep 5000   R IJ cath  - Hect 3 ug - Venofer 50/wk last 4/20 - last pth 151, Ca 8.5, P 3 on phoslo 1 ac tid      Assessment: 1. AMS - pt presented for not eating well and not communicating like he usually does.  Admitted with dx of PNA based on "patchy" LLL retrocard infiltrate.  No WBC/ fever. No CHF.  He remains nonverbal, staring off and moving his arms in the air slowly.  Not sure of his baseline.  2. Hx dementia 3. Possible PNA - on emp IV  abx 4. ESRD usual HD MWF 5. Volume - is at dry wt, +leg edema, not tolerating much UF on HD 6. DM2 per primary 7. HTN cont labetalol 8. Anemia of CKD - Hb 11, no esa needed 9. MBD of CKD - cont binder, vdra  Plan - as above, HD today, IV abx   Kelly Splinter MD [facility] pager [phone]   01/18/2017, 12:32 PM    Recent Labs Lab 01/17/17 0255 01/17/17 0617 01/18/17 0443  NA 140 138 137  K 4.9 5.1 4.7  CL 111 103 105  CO2 24 27 24   GLUCOSE 65 69 79  BUN 12 14 19   CREATININE 3.89* 4.78* 6.07*  CALCIUM 6.5* 7.9* 8.0*    Recent Labs Lab 01/17/17 0255 01/17/17 0617  AST 44* 46*  ALT 37 50  ALKPHOS 121 159*  BILITOT 0.8 1.0  PROT 4.6* 6.0*  ALBUMIN 1.7* 2.1*    Recent Labs Lab 01/16/17 2337 01/17/17 0617 01/18/17 0443  WBC 6.3 6.7 7.5  NEUTROABS  --  4.0 5.1  HGB 11.6* 11.9* 10.8*  HCT 35.0* 36.0* 32.0*  MCV 87.5 88.9 87.4  PLT 213 96* 102*   Iron/TIBC/Ferritin/ %Sat  Component Value Date/Time   IRON 31 (L) 05/27/2011 0757   TIBC 170 (L) 05/27/2011 0757   FERRITIN 840 (H) 05/27/2011 0757   IRONPCTSAT 18 (L) 05/27/2011 3010

## 2017-01-18 NOTE — Progress Notes (Signed)
OT Cancellation Note  Patient Details Name: Steven Forbes MRN: [id number] DOB: [birth year]   Cancelled Treatment:    Reason Eval/Treat Not Completed: Patient at procedure or test/ unavailable (currently off the floor). Will follow up for OT eval as time allows.  Binnie Kand M.S., OTR/L Pager: [phone]  01/18/2017, 9:10 AM

## 2017-01-19 DIAGNOSIS — E0840 Diabetes mellitus due to underlying condition with diabetic neuropathy, unspecified: Secondary | ICD-10-CM

## 2017-01-19 LAB — BASIC METABOLIC PANEL
ANION GAP: 9 (ref 5–15)
BUN: 10 mg/dL (ref 6–20)
CALCIUM: 7.9 mg/dL — AB (ref 8.9–10.3)
CO2: 25 mmol/L (ref 22–32)
Chloride: 98 mmol/L — ABNORMAL LOW (ref 101–111)
Creatinine, Ser: 4.33 mg/dL — ABNORMAL HIGH (ref 0.61–1.24)
GFR calc non Af Amer: 11 mL/min — ABNORMAL LOW (ref >60)
GFR, EST AFRICAN AMERICAN: 13 mL/min — AB (ref >60)
Glucose, Bld: 161 mg/dL — ABNORMAL HIGH (ref 65–99)
POTASSIUM: 4 mmol/L (ref 3.5–5.1)
Sodium: 132 mmol/L — ABNORMAL LOW (ref 135–145)

## 2017-01-19 LAB — GLUCOSE, CAPILLARY
GLUCOSE-CAPILLARY: 73 mg/dL (ref 65–99)
GLUCOSE-CAPILLARY: 148 mg/dL — AB (ref 65–99)
Glucose-Capillary: 106 mg/dL — ABNORMAL HIGH (ref 65–99)
Glucose-Capillary: 78 mg/dL (ref 65–99)

## 2017-01-19 MED ORDER — AMOXICILLIN-POT CLAVULANATE 500-125 MG PO TABS: ORAL_TABLET | ORAL | 0 refills | Status: AC

## 2017-01-19 MED ORDER — OMEPRAZOLE 20 MG PO CPDR: 20.0000 mg | DELAYED_RELEASE_CAPSULE | Freq: Every day | ORAL | 1 refills | Status: AC

## 2017-01-19 NOTE — Progress Notes (Signed)
    Durable Medical Equipment        Start     Ordered   01/19/17 1212  For home use only DME lightweight manual wheelchair with seat cushion  Once    Comments:  Patient suffers from esrd  which impairs their ability to perform daily activities like ADL in the home.  A cane or walker  will not resolve  issue with performing activities of daily living. A wheelchair will allow patient to safely perform daily activities. Patient is not able to propel themselves in the home using a standard weight wheelchair due to esrd /PNA . Patient can self propel in the lightweight wheelchair.  Accessories: elevating leg rests (ELRs), wheel locks, extensions and anti-tippers.   01/19/17 1211   01/19/17 1211  For home use only DME Walker rolling  Once    Question:  Patient needs a walker to treat with the following condition  Answer:  Pneumonia   01/19/17 1210   01/19/17 1211  For home use only DME wheelchair cushion (seat and back)  Once     01/19/17 1210   01/18/17 0821  For home use only DME Walker rolling  Once    Question:  Patient needs a walker to treat with the following condition  Answer:  ESRD (end stage renal disease) ([place])   01/18/17 0312

## 2017-01-19 NOTE — Progress Notes (Signed)
[facility] Progress Note   Subjective: much more alert, up in chair, still disoirented but making converstaion and animated today  Vitals:   01/18/17 1410 01/18/17 1500 01/18/17 2017 01/19/17 0509  BP: (!) 161/70 (!) 149/55 (!) 123/54 129/82  Pulse: 76 74 74 80  Resp: (!) 22  18 18   Temp: 98 F (36.7 C)  98.9 F (37.2 C) 98.1 F (36.7 C)  TempSrc: Oral  Oral Oral  SpO2: 100%  99% 100%  Weight: 59.6 kg (131 lb 6.3 oz)   59 kg (130 lb 1.1 oz)  Height:        Inpatient medications: . amoxicillin-clavulanate  1 tablet Oral Daily  . amoxicillin-clavulanate  1 tablet Oral Q M,W,F-1800  . aspirin EC  81 mg Oral Daily  . atorvastatin  80 mg Oral Daily  . calcium acetate  667 mg Oral TID WC  . doxercalciferol  3 mcg Intravenous Q M,W,F-HD  . heparin  5,000 Units Subcutaneous Q8H  . insulin aspart  0-9 Units Subcutaneous Q4H  . labetalol  300 mg Oral TID  . memantine  5 mg Oral Daily  . pantoprazole  40 mg Oral Daily  . risperiDONE  1 mg Oral QHS   . dextrose 10 mL/hr at 01/18/17 2220   acetaminophen **OR** acetaminophen, albuterol, hydrALAZINE, oxyCODONE-acetaminophen  Exam: Alert, up in bed No jvd Chest clear bilat , poor cooperation though RRR no mrg ABd soft ntnd no ascites Ext 1+ edema LE"s Neuro - awake, Ox 1  Dialysis: [facility] 4h   61kg   400/A1.5   PF  3K/2/25 bath  Hep 5000   R IJ cath  - Hect 3 ug - Venofer 50/wk last 4/20 - last pth 151, Ca 8.5, P 3 on phoslo 1 ac tid      Assessment: 1. AMS - much better w Rx of PNA.  Hx of dementia 2. PNA LLL - on IV abx 3. ESRD usual HD MWF 4. Volume - lowering dry wt for LE edema. CXR no edema 5. DM2 per primary 6. HTN cont labetalol 7. Anemia of CKD - Hb 11, no esa needed 8. MBD of CKD - cont binder, vdra  Plan - HD Friday, lower dry wt slightly more   Kelly Splinter MD [facility] pager [phone]   01/19/2017, 12:43 PM    Recent Labs Lab 01/17/17 0617 01/18/17 0443  01/18/17 1529  NA 138 137 132*  K 5.1 4.7 3.3*  CL 103 105 99*  CO2 27 24 26   GLUCOSE 69 79 71  BUN 14 19 <5*  CREATININE 4.78* 6.07* 2.55*  CALCIUM 7.9* 8.0* 7.3*  PHOS  --   --  1.9*    Recent Labs Lab 01/17/17 0255 01/17/17 0617 01/18/17 1529  AST 44* 46*  --   ALT 37 50  --   ALKPHOS 121 159*  --   BILITOT 0.8 1.0  --   PROT 4.6* 6.0*  --   ALBUMIN 1.7* 2.1* 1.8*    Recent Labs Lab 01/17/17 0617 01/18/17 0443 01/18/17 1529  WBC 6.7 7.5 8.6  NEUTROABS 4.0 5.1  --   HGB 11.9* 10.8* 10.7*  HCT 36.0* 32.0* 32.2*  MCV 88.9 87.4 87.0  PLT 96* 102* 117*   Iron/TIBC/Ferritin/ %Sat    Component Value Date/Time   IRON 31 (L) 05/27/2011 0757   TIBC 170 (L) 05/27/2011 0757   FERRITIN 840 (H) 05/27/2011 0757   IRONPCTSAT 18 (L) 05/27/2011 0757

## 2017-01-19 NOTE — Discharge Summary (Signed)
Physician Discharge Summary  Steven Forbes MRN: [id number] DOB/AGE: March 29, 1926 83 y.o.  PCP: Precious Reel, MD   Admit date: 01/16/2017 Discharge date: 01/19/2017  Discharge Diagnoses:    Principal Problem:   HCAP (healthcare-associated pneumonia) Active Problems:   ANEMIA   ESRD on dialysis [facility])   possible Pneumonia   CAD (coronary artery disease)   Essential hypertension   Aortic stenosis   Diabetes with neurologic complications ([place])   Acute encephalopathy    Follow-up recommendations Follow-up with PCP in 3-5 days , including all  additional recommended appointments as below Follow-up CBC, CMP in 3-5 days  Patient is being discharged with home health     Current Discharge Medication List    START taking these medications   Details  amoxicillin-clavulanate (AUGMENTIN) 500-125 MG tablet 1 tablet daily plus one tablet Monday Wednesday Friday Qty: 10 tablet, Refills: 0      CONTINUE these medications which have CHANGED   Details  omeprazole (PRILOSEC) 20 MG capsule Take 1 capsule (20 mg total) by mouth daily. Qty: 30 capsule, Refills: 1      CONTINUE these medications which have NOT CHANGED   Details  atorvastatin (LIPITOR) 80 MG tablet Take 80 mg by mouth daily.     calcium acetate (PHOSLO) 667 MG capsule Take 667 mg by mouth 3 (three) times daily with meals.     [facility] ASPIRIN LOW DOSE 81 MG EC tablet TAKE 1 TABLET (81 MG TOTAL) BY MOUTH DAILY. NEED OV. Qty: 15 tablet, Refills: 0    Insulin Isophane Human (HUMULIN N PEN [place]) Inject 5-10 Units into the skin 2 (two) times daily.     labetalol (NORMODYNE) 300 MG tablet Take 300 mg by mouth once.     memantine (NAMENDA) 5 MG tablet Take 5 mg by mouth daily.  Refills: 3    PROAIR HFA 108 (90 Base) MCG/ACT inhaler Inhale 2 puffs into the lungs 3 (three) times daily.     risperiDONE (RISPERDAL) 0.5 MG tablet Take 1 mg by mouth at bedtime.     darbepoetin (ARANESP) 100 MCG/0.5ML SOLN injection Inject 0.5 mLs  (100 mcg total) into the vein every Monday with hemodialysis. Qty: 4.2 mL      STOP taking these medications     oxyCODONE-acetaminophen (ROXICET) 5-325 MG tablet          Discharge Condition: Stable   Discharge Instructions Get Medicines reviewed and adjusted: Please take all your medications with you for your next visit with your Primary MD  Please request your Primary MD to go over all hospital tests and procedure/radiological results at the follow up, please ask your Primary MD to get all Hospital records sent to his/her office.  If you experience worsening of your admission symptoms, develop shortness of breath, life threatening emergency, suicidal or homicidal thoughts you must seek medical attention immediately by calling 911 or calling your MD immediately if symptoms less severe.  You must read complete instructions/literature along with all the possible adverse reactions/side effects for all the Medicines you take and that have been prescribed to you. Take any new Medicines after you have completely understood and accpet all the possible adverse reactions/side effects.   Do not drive when taking Pain medications.   Do not take more than prescribed Pain, Sleep and Anxiety Medications  Special Instructions: If you have smoked or chewed Tobacco in the last 2 yrs please stop smoking, stop any regular Alcohol and or any Recreational drug use.  Wear Seat belts while  driving.  Please note  You were cared for by a hospitalist during your hospital stay. Once you are discharged, your primary care physician will handle any further medical issues. Please note that NO REFILLS for any discharge medications will be authorized once you are discharged, as it is imperative that you return to your primary care physician (or establish a relationship with a primary care physician if you do not have one) for your aftercare needs so that they can reassess your need for medications and monitor  your lab values.     No Known Allergies    Disposition: 01-Home or Self Care   Consults:  Nephrology    Significant Diagnostic Studies:  Dg Chest 2 View  Result Date: 01/17/2017 CLINICAL DATA:  Altered mental status with loss of appetite EXAM: CHEST  2 VIEW COMPARISON:  11/21/2013 FINDINGS: Right-sided dialysis catheter tip overlies the mid right atrium. There is mild cardiomegaly. Patchy posterior opacity, likely left lower lobe. No large effusion. Stable cardiomegaly with atherosclerosis. No pneumothorax. IMPRESSION: 1. Patchy posterior opacity, likely left base may reflect infiltrate. 2. Cardiomegaly. Electronically Signed   By: Donavan Foil M.D.   On: 01/17/2017 03:02   Ct Head Wo Contrast  Result Date: 01/17/2017 CLINICAL DATA:  Not eating well, altered mental status EXAM: CT HEAD WITHOUT CONTRAST TECHNIQUE: Contiguous axial images were obtained from the base of the skull through the vertex without intravenous contrast. COMPARISON:  12/02/2014 FINDINGS: Brain: No acute territorial infarction, hemorrhage or intracranial mass is visualized. Moderate periventricular and subcortical white matter hypodensity, consistent with small vessel ischemia, mildly progressed since the previous exam. Small focal hypodensity in the right basal ganglia may reflect small age indeterminate lacunar infarct. Mild to moderate atrophy. Nonenlarged ventricles given the presence of atrophy. Vascular: No hyperdense vessels.  Carotid artery calcifications. Skull: Heterogenous mineralization of the skullbase and mandible. Sinuses/Orbits: Remodeling of the sinuses consistent with chronic sinus disease. Mucosal thickening within the ethmoid and sphenoid sinuses. 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 No acute orbital abnormality. Other: None IMPRESSION: 1. Negative for hemorrhage or focal mass lesion 2. Small focal hypodensity in the right basal ganglia may represent a age indeterminate lacunar infarct. 3. Moderate white  matter small vessel ischemic changes, slightly progressed since 2015. Electronically Signed   By: Donavan Foil M.D.   On: 01/17/2017 03:08        Filed Weights   01/18/17 0955 01/18/17 1410 01/19/17 0509  Weight: 61.3 kg (135 lb 2.3 oz) 59.6 kg (131 lb 6.3 oz) 59 kg (130 lb 1.1 oz)     Microbiology: Recent Results (from the past 240 hour(s))  Surgical PCR screen     Status: None   Collection Time: 01/17/17  7:00 AM  Result Value Ref Range Status   MRSA, PCR NEGATIVE NEGATIVE Final   Staphylococcus aureus NEGATIVE NEGATIVE Final    Comment:        The Xpert SA Assay (FDA approved for NASAL specimens in patients over 47 years of age), is one component of a comprehensive surveillance program.  Test performance has been validated by [facility] for patients greater than or equal to 26 year old. It is not intended to diagnose infection nor to guide or monitor treatment.        Blood Culture    Component Value Date/Time   SDES BLOOD RIGHT ARM 09/09/2013 0045   SPECREQUEST BOTTLES DRAWN AEROBIC AND ANAEROBIC 5CC EACH 09/09/2013 0045   CULT  09/09/2013 0045    NO  GROWTH 5 DAYS Performed at [facility] 09/15/2013 FINAL 09/09/2013 0045      Labs: Results for orders placed or performed during the hospital encounter of 01/16/17 (from the past 48 hour(s))  Glucose, capillary     Status: None   Collection Time: 01/17/17  4:24 PM  Result Value Ref Range   Glucose-Capillary 92 65 - 99 mg/dL   Comment 1 Notify RN    Comment 2 Document in Chart   Glucose, capillary     Status: None   Collection Time: 01/17/17  8:06 PM  Result Value Ref Range   Glucose-Capillary 71 65 - 99 mg/dL  Glucose, capillary     Status: Abnormal   Collection Time: 01/17/17  9:58 PM  Result Value Ref Range   Glucose-Capillary 103 (H) 65 - 99 mg/dL  Glucose, capillary     Status: None   Collection Time: 01/18/17  1:01 AM  Result Value Ref Range   Glucose-Capillary 93 65 -  99 mg/dL  Glucose, capillary     Status: None   Collection Time: 01/18/17  1:20 AM  Result Value Ref Range   Glucose-Capillary 92 65 - 99 mg/dL  Glucose, capillary     Status: None   Collection Time: 01/18/17  4:40 AM  Result Value Ref Range   Glucose-Capillary 79 65 - 99 mg/dL  CBC with Differential/Platelet     Status: Abnormal   Collection Time: 01/18/17  4:43 AM  Result Value Ref Range   WBC 7.5 4.0 - 10.5 K/uL   RBC 3.66 (L) 4.22 - 5.81 MIL/uL   Hemoglobin 10.8 (L) 13.0 - 17.0 g/dL   HCT 32.0 (L) 39.0 - 52.0 %   MCV 87.4 78.0 - 100.0 fL   MCH 29.5 26.0 - 34.0 pg   MCHC 33.8 30.0 - 36.0 g/dL   RDW 16.3 (H) 11.5 - 15.5 %   Platelets 102 (L) 150 - 400 K/uL    Comment: CONSISTENT WITH PREVIOUS RESULT   Neutrophils Relative % 68 %   Neutro Abs 5.1 1.7 - 7.7 K/uL   Lymphocytes Relative 20 %   Lymphs Abs 1.5 0.7 - 4.0 K/uL   Monocytes Relative 11 %   Monocytes Absolute 0.9 0.1 - 1.0 K/uL   Eosinophils Relative 1 %   Eosinophils Absolute 0.1 0.0 - 0.7 K/uL   Basophils Relative 0 %   Basophils Absolute 0.0 0.0 - 0.1 K/uL  Basic metabolic panel     Status: Abnormal   Collection Time: 01/18/17  4:43 AM  Result Value Ref Range   Sodium 137 135 - 145 mmol/L   Potassium 4.7 3.5 - 5.1 mmol/L   Chloride 105 101 - 111 mmol/L   CO2 24 22 - 32 mmol/L   Glucose, Bld 79 65 - 99 mg/dL   BUN 19 6 - 20 mg/dL   Creatinine, Ser 6.07 (H) 0.61 - 1.24 mg/dL   Calcium 8.0 (L) 8.9 - 10.3 mg/dL   GFR calc non Af Amer 7 (L) >60 mL/min   GFR calc Af Amer 9 (L) >60 mL/min    Comment: (NOTE) The eGFR has been calculated using the CKD EPI equation. This calculation has not been validated in all clinical situations. eGFR's persistently <60 mL/min signify possible Chronic Kidney Disease.    Anion gap 8 5 - 15  Glucose, capillary     Status: None   Collection Time: 01/18/17  5:56 AM  Result Value Ref Range   Glucose-Capillary 68  65 - 99 mg/dL  Glucose, capillary     Status: Abnormal    Collection Time: 01/18/17  6:28 AM  Result Value Ref Range   Glucose-Capillary 135 (H) 65 - 99 mg/dL  Hepatitis B surface antigen     Status: None   Collection Time: 01/18/17 10:14 AM  Result Value Ref Range   Hepatitis B Surface Ag Negative Negative    Comment: RESULT CALLED TO, READ BACK BY AND VERIFIED WITH: MELISSA HARRIET,RN (IN DIALYSIS) AT 1413 01/18/17 BY ZBEECH. (NOTE) A courtesy copy of this report has been sent to [facility], [phone]. STAT RESULT FAXED TO [phone] / CALLED TO ZELDA BEECH, LAB  01-18-2017 1346 HRS Performed At: [facility] [place], [place] [id number] Lindon Romp MD BH:[id number]   Ammonia     Status: None   Collection Time: 01/18/17  3:29 PM  Result Value Ref Range   Ammonia 35 9 - 35 umol/L  Renal function panel     Status: Abnormal   Collection Time: 01/18/17  3:29 PM  Result Value Ref Range   Sodium 132 (L) 135 - 145 mmol/L   Potassium 3.3 (L) 3.5 - 5.1 mmol/L    Comment: DELTA CHECK NOTED   Chloride 99 (L) 101 - 111 mmol/L   CO2 26 22 - 32 mmol/L   Glucose, Bld 71 65 - 99 mg/dL   BUN <5 (L) 6 - 20 mg/dL   Creatinine, Ser 2.55 (H) 0.61 - 1.24 mg/dL    Comment: DIALYSIS   Calcium 7.3 (L) 8.9 - 10.3 mg/dL   Phosphorus 1.9 (L) 2.5 - 4.6 mg/dL   Albumin 1.8 (L) 3.5 - 5.0 g/dL   GFR calc non Af Amer 21 (L) >60 mL/min   GFR calc Af Amer 24 (L) >60 mL/min    Comment: (NOTE) The eGFR has been calculated using the CKD EPI equation. This calculation has not been validated in all clinical situations. eGFR's persistently <60 mL/min signify possible Chronic Kidney Disease.    Anion gap 7 5 - 15  CBC     Status: Abnormal   Collection Time: 01/18/17  3:29 PM  Result Value Ref Range   WBC 8.6 4.0 - 10.5 K/uL   RBC 3.70 (L) 4.22 - 5.81 MIL/uL   Hemoglobin 10.7 (L) 13.0 - 17.0 g/dL   HCT 32.2 (L) 39.0 - 52.0 %   MCV 87.0 78.0 - 100.0 fL   MCH 28.9 26.0 - 34.0 pg   MCHC 33.2 30.0 - 36.0 g/dL   RDW 15.7 (H)  11.5 - 15.5 %   Platelets 117 (L) 150 - 400 K/uL    Comment: CONSISTENT WITH PREVIOUS RESULT  Glucose, capillary     Status: None   Collection Time: 01/18/17  3:36 PM  Result Value Ref Range   Glucose-Capillary 70 65 - 99 mg/dL  Glucose, capillary     Status: None   Collection Time: 01/18/17  8:01 PM  Result Value Ref Range   Glucose-Capillary 66 65 - 99 mg/dL   Comment 1 Notify RN    Comment 2 Document in Chart   Glucose, capillary     Status: Abnormal   Collection Time: 01/18/17 10:25 PM  Result Value Ref Range   Glucose-Capillary 61 (L) 65 - 99 mg/dL  Glucose, capillary     Status: Abnormal   Collection Time: 01/18/17 10:53 PM  Result Value Ref Range   Glucose-Capillary 142 (H) 65 - 99 mg/dL  Glucose, capillary  Status: Abnormal   Collection Time: 01/19/17 12:11 AM  Result Value Ref Range   Glucose-Capillary 106 (H) 65 - 99 mg/dL  Glucose, capillary     Status: None   Collection Time: 01/19/17  4:04 AM  Result Value Ref Range   Glucose-Capillary 78 65 - 99 mg/dL  Glucose, capillary     Status: None   Collection Time: 01/19/17  7:36 AM  Result Value Ref Range   Glucose-Capillary 73 65 - 99 mg/dL  Glucose, capillary     Status: Abnormal   Collection Time: 01/19/17 11:40 AM  Result Value Ref Range   Glucose-Capillary 148 (H) 65 - 99 mg/dL     Lipid Panel     Component Value Date/Time   CHOL 113 07/08/2014 1150   TRIG 88 07/08/2014 1150   HDL 42 07/08/2014 1150   CHOLHDL 2.7 07/08/2014 1150   VLDL 18 07/08/2014 1150   LDLCALC 53 07/08/2014 1150     Lab Results  Component Value Date   HGBA1C 6.2 (H) 09/09/2013   HGBA1C  04/17/2007    5.5 (NOTE)   The ADA recommends the following therapeutic goals for glycemic   control related to Hgb A1C measurement:   Goal of Therapy:   < 7.0% Hgb A1C   Action Suggested:  > 8.0% Hgb A1C   Ref:  Diabetes Care, 33, Suppl. 1, 1999        HPI :   83 y.o.malewith ESRD on hemodialysis MWF at [facility]. PMH significant for DMT2, HTN, HOH, HLD, dementia, SHPT, anemia of chronic disease. H/O R transmet amputation 07/19/11.  Patient thrombosed LUA AVF access 01/13/17. He had catheter placed 01/16/17 per Dr. Augustin Coupe at [facility]. Per son-in-law, patient appeared "to be slowing down" over weekend, notmoving around as much as usualwith poor appetite. Yesterday after [facility] insertion, patient became lethargic and family called 911. Paramedics administered IVF to patient with some improvement in lethargy. Later in the evening, patient appeared to become less responsive and 911 was called again, this time transporting patient to ED for evaluation of increasing lethargy, weakness, poor appetite. Patient found to have pneumonia  HOSPITAL COURSE:  1. Healthcare associated pneumonia - patient has received Vanc and Cefepime since 4/30.  Changed to Augmentin for another 5 days. Blood culture no growth so far. MRSA PCR negative. Dysphagia 1 diet.  Probable metabolic encephalopathy: CT head negative for hemorrhage or focal mass lesion. monitor mental status; fall precautions. PT/OT eval recommended home health. Ammonia level okay 2. ESRD on hemodialysis Monday Wednesday and Friday - Nephrology following 3. Diabetes mellitus type 2 - patient is usually on insulin which family had not given last 2 days due to poor oral intake. Accu-Cheks remain borderline low 4. Hypertension - on labetalol.   5. Chronic anemia probably from ESRD - follow CBC. Hemoglobin is 10.7 prior to discharge. Platelet count 117 6. History of CAD - has not complained of any chest pain.    Discharge Exam:  Blood pressure 129/82, pulse 80, temperature 98.1 F (36.7 C), temperature source Oral, resp. rate 18, height _0  (1.6 m), weight 59 kg (130 lb 1.1 oz), SpO2 100 %.  General exam: Appears calm and comfortable  Respiratory system: Bilateral decreased breath sounds at bases; few scattered crackles Cardiovascular system: S1 & S2 heard, RRR.   Gastrointestinal system: Abdomen is nondistended, soft and nontender. No organomegaly or masses felt. Normal bowel sounds heard. Central nervous system: Sleepy; wakes up slightly. No focal neurological deficits.  Extremities: No cyanosis, clubbing, edema Skin: No rashes, lesions or ulcers     Follow-up Trail Follow up.   Why:  rolling walker arranged - to be delivered to room prior to discharge Contact information: [place] [address] [phone]        Precious Reel, MD. Call.   Specialty:  Internal Medicine Why:  Hospital follow-up in 3-5 days Contact information: [place] [address] [phone]           Signed: Reyne Dumas 01/19/2017, 12:17 PM        Time spent >45 mins

## 2017-01-19 NOTE — Care Management Note (Signed)
Case Management Note  Patient Details  Name: Steven Forbes MRN: [id number] Date of Birth: 1926-06-26  Subjective/Objective:                    Action/Plan: Plan is to d/c to home today.  Expected Discharge Date:  01/19/17               Expected Discharge Plan:  Home/Self Care  In-House Referral:     Discharge planning Services  CM Consult  Post Acute Care Choice:  Durable Medical Equipment Choice offered to:  Spouse  DME Arranged:  [facility] DME Agency:  [place]., referral made with Santiago Glad @ [phone]  [facility] Arranged:  PT, Nurse's Aide (refused OT,RN, Respiratory therapy) [place] Agency:  [facility] [place], referral made with [phone]  Status of Service:  Completed, signed off  If discussed at [place] of Stay Meetings, dates discussed:    Additional Comments:  Sharin Mons, RN 01/19/2017, 3:23 PM

## 2017-01-19 NOTE — Progress Notes (Signed)
Physical Therapy Treatment Patient Details Name: Steven Forbes MRN: [id number] DOB: 09/19/1926 Today's Date: 01/19/2017    History of Present Illness Steven Forbes is a 83 y.o. male with history of dementia, ESRD on hemodialysis on Monday Wednesday Friday, hypertension, diabetes mellitus, chronic anemia, CAD who had new subclavian hemodialysis catheter placed yesterday was brought to the ER because of increasing lethargy and weakness over the last 3 days. Patient also has been having poor oral intake.     PT Comments    Updated home equipment recommendations to include w/c. W/C will be needed for pt/family to manage longer distances.   Follow Up Recommendations  Home health PT;Supervision/Assistance - 24 hour     Equipment Recommendations  Rolling walker with 5" wheels;Wheelchair (measurements PT);Wheelchair cushion (measurements PT)    Recommendations for Other Services       Precautions / Restrictions Precautions Precautions: Fall    Mobility  Bed Mobility         Supine to sit: Mod assist     General bed mobility comments: No initiation, verbal cues throughout for sequencing. Use of bed pad to scoot EOB. Increased time to attain sitting balance. Pt initially retropulsive. Placing RW in front of pt worked as Environmental health practitioner cue to [employer] forward.  Transfers   Equipment used: Rolling walker (2 wheeled)   Sit to Stand: Mod assist         General transfer comment: max directional verbal and tactile cues to achieve stand, increased time to stabilize initial standing balance due to retropulsive.  Ambulation/Gait Ambulation/Gait assistance: Mod assist Ambulation Distance (Feet): 15 Feet Assistive device: Rolling walker (2 wheeled) Gait Pattern/deviations: Step-through pattern;Decreased stride length;Shuffle;Trunk flexed;Narrow base of support Gait velocity: slow Gait velocity interpretation: Below normal speed for age/gender General Gait Details: assist with balance and RW  management   Stairs            Wheelchair Mobility    Modified Rankin (Stroke Patients Only)       Balance   Sitting-balance support: Feet supported;No upper extremity supported Sitting balance-Leahy Scale: Fair     Standing balance support: During functional activity;Bilateral upper extremity supported Standing balance-Leahy Scale: Poor Standing balance comment: requires physical assist                            Cognition Arousal/Alertness: Awake/alert Behavior During Therapy: Flat affect Overall Cognitive Status: History of cognitive impairments - at baseline                                        Exercises      General Comments        Pertinent Vitals/Pain Pain Assessment: Faces Faces Pain Scale: No hurt    Home Living                      Prior Function            PT Goals (current goals can now be found in the care plan section) Acute Rehab PT Goals Patient Stated Goal: didn't state PT Goal Formulation: With patient Time For Goal Achievement: 01/31/17 Potential to Achieve Goals: Good Progress towards PT goals: Progressing toward goals    Frequency    Min 3X/week      PT Plan Equipment recommendations need to be updated    Co-evaluation  AM-PAC PT "6 Clicks" Daily Activity  Outcome Measure  Difficulty turning over in bed (including adjusting bedclothes, sheets and blankets)?: A Lot Difficulty moving from lying on back to sitting on the side of the bed? : Total Difficulty sitting down on and standing up from a chair with arms (e.g., wheelchair, bedside commode, etc,.)?: Total Help needed moving to and from a bed to chair (including a wheelchair)?: A Lot Help needed walking in hospital room?: A Lot Help needed climbing 3-5 steps with a railing? : A Lot 6 Click Score: 10    End of Session Equipment Utilized During Treatment: Gait belt Activity Tolerance: Patient tolerated  treatment well Patient left: in chair;with call bell/phone within reach;with family/visitor present Nurse Communication: Mobility status PT Visit Diagnosis: Unsteadiness on feet (R26.81);Muscle weakness (generalized) (M62.81);Difficulty in walking, not elsewhere classified (R26.2)     Time: 4540-9811 PT Time Calculation (min) (ACUTE ONLY): 25 min  Charges:  $Gait Training: 8-22 mins $Therapeutic Activity: 8-22 mins                    G Codes:       Lorrin Goodell, PT  Office # [phone] Pager [phone]    Lorriane Shire 01/19/2017, 10:21 AM

## 2017-01-19 NOTE — Discharge Instructions (Signed)
Hold insulin for CBG less than 150  Meds with pured applesauce  Aspiration precautions

## 2017-01-19 NOTE — Evaluation (Signed)
Occupational Therapy Evaluation Patient Details Name: Steven Forbes MRN: [id number] DOB: 19-Jun-1926 Today's Date: 01/19/2017    History of Present Illness Steven Forbes is a 83 y.o. male with history of dementia, ESRD on hemodialysis on Monday Wednesday Friday, hypertension, diabetes mellitus, chronic anemia, CAD who had new subclavian hemodialysis catheter placed yesterday was brought to the ER because of increasing lethargy and weakness over the last 3 days. Patient also has been having poor oral intake.    Clinical Impression   Per wife pt required assist with ADL PTA. Currently pt overall max assist for ADL and sit to stand with strong posterior lean in standing. Unable to progress mobility today due to impaired balance and weakness. Pt planning to d/c home with 24/7 supervision from family. Recommending HHOT and [place] aide for follow up to maximize independence and safety with ADL and functional mobility upon return home. Pt would benefit from continued skilled OT to address established goals.    Follow Up Recommendations  Home health OT;Supervision/Assistance - 24 hour;Other (comment) ([place] aide)    Equipment Recommendations  3 in 1 bedside commode    Recommendations for Other Services       Precautions / Restrictions Precautions Precautions: Fall Restrictions Weight Bearing Restrictions: No      Mobility Bed Mobility         Supine to sit: Mod assist     General bed mobility comments: Pt OOB in chair  Transfers Overall transfer level: Needs assistance Equipment used: Rolling walker (2 wheeled) Transfers: Sit to/from Stand Sit to Stand: Max assist         General transfer comment: Max assist with max cues to stand from chair x1. Pt with strong posterior lean in standing.    Balance Overall balance assessment: Needs assistance Sitting-balance support: Feet supported;Bilateral upper extremity supported Sitting balance-Leahy Scale: Poor   Postural control:  Posterior lean Standing balance support: Bilateral upper extremity supported Standing balance-Leahy Scale: Poor Standing balance comment: requires physical assist                           ADL either performed or assessed with clinical judgement   ADL Overall ADL's : Needs assistance/impaired     Grooming: Moderate assistance;Sitting   Upper Body Bathing: Maximal assistance;Sitting   Lower Body Bathing: Maximal assistance;Sit to/from stand   Upper Body Dressing : Moderate assistance;Sitting   Lower Body Dressing: Maximal assistance;Sit to/from stand                 General ADL Comments: Max assist for sit to stand from chair with increased time. Pt with posterior lean making transfers unsafe at this time.     Vision         Perception     Praxis      Pertinent Vitals/Pain Pain Assessment: No/denies pain Faces Pain Scale: No hurt     Hand Dominance Right   Extremity/Trunk Assessment Upper Extremity Assessment Upper Extremity Assessment: Generalized weakness   Lower Extremity Assessment Lower Extremity Assessment: Defer to PT evaluation   Cervical / Trunk Assessment Cervical / Trunk Assessment: Kyphotic   Communication Communication Communication: HOH   Cognition Arousal/Alertness: Awake/alert Behavior During Therapy: Flat affect Overall Cognitive Status: History of cognitive impairments - at baseline  General Comments       Exercises     Shoulder Instructions      Home Living Family/patient expects to be discharged to:: Private residence Living Arrangements: Spouse/significant other Available Help at Discharge: Family;Available 24 hours/day Type of Home: House Home Access: Level entry     Home Layout: One level     Bathroom Shower/Tub: Teacher, early years/pre: Standard     Home Equipment: Cane - single point          Prior Functioning/Environment Level of  Independence: Needs assistance  Gait / Transfers Assistance Needed: pt was walking without AD however required physical assist over the last 2 weeks ADL's / Homemaking Assistance Needed: wife assists with sponge bathing and dressing            OT Problem List: Decreased strength;Decreased activity tolerance;Impaired balance (sitting and/or standing);Decreased cognition;Decreased safety awareness      OT Treatment/Interventions: Self-care/ADL training;Energy conservation;DME and/or AE instruction;Therapeutic activities;Patient/family education;Balance training    OT Goals(Current goals can be found in the care plan section) Acute Rehab OT Goals Patient Stated Goal: home today OT Goal Formulation: With patient/family Time For Goal Achievement: 02/02/17 Potential to Achieve Goals: Fair ADL Goals Pt Will Perform Eating: with set-up;with supervision;sitting Pt Will Perform Grooming: with min assist;sitting Pt Will Transfer to Toilet: with min guard assist;ambulating;bedside commode (over toilet)  OT Frequency: Min 2X/week   Barriers to D/C:            Co-evaluation              AM-PAC PT "6 Clicks" Daily Activity     Outcome Measure Help from another person eating meals?: A Little Help from another person taking care of personal grooming?: A Lot Help from another person toileting, which includes using toliet, bedpan, or urinal?: Total Help from another person bathing (including washing, rinsing, drying)?: A Lot Help from another person to put on and taking off regular upper body clothing?: A Lot Help from another person to put on and taking off regular lower body clothing?: Total 6 Click Score: 11   End of Session Equipment Utilized During Treatment: Gait belt;Rolling walker  Activity Tolerance: Patient tolerated treatment well Patient left: in chair;with call bell/phone within reach;with family/visitor present  OT Visit Diagnosis: Unsteadiness on feet (R26.81);Other  abnormalities of gait and mobility (R26.89);Muscle weakness (generalized) (M62.81)                Time: 8416-6063 OT Time Calculation (min): 16 min Charges:  OT General Charges $OT Visit: 1 Procedure OT Evaluation $OT Eval Moderate Complexity: 1 Procedure G-Codes:     Rayma Hegg A. Ulice Brilliant, M.S., OTR/L Pager: [place] 01/19/2017, 1:47 PM

## 2017-02-22 ENCOUNTER — Encounter: Payer: Self-pay | Admitting: Vascular Surgery

## 2017-02-28 ENCOUNTER — Ambulatory Visit (INDEPENDENT_AMBULATORY_CARE_PROVIDER_SITE_OTHER): Payer: Medicare Other | Admitting: Vascular Surgery

## 2017-02-28 ENCOUNTER — Encounter: Payer: Self-pay | Admitting: Vascular Surgery

## 2017-02-28 VITALS — BP 137/74 | HR 84 | Temp 98.3°F | Resp 16 | Ht 63.0 in | Wt 124.0 lb

## 2017-02-28 DIAGNOSIS — Z992 Dependence on renal dialysis: Secondary | ICD-10-CM

## 2017-02-28 DIAGNOSIS — N186 End stage renal disease: Secondary | ICD-10-CM

## 2017-02-28 NOTE — Progress Notes (Signed)
Vascular and Vein Specialist of [facility]  Patient name: Steven Forbes MRN: [id number] DOB: 03/16/1926 Sex: male  REASON FOR VISIT: Discuss access for hemodialysis  HPI: Steven Forbes is a 83 y.o. male known to our practice from prior access placement. 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 He is subsequently thrombosed this and is now being dialyzed via right IJ catheter. Family reports this is been present for proximally 6 weeks and is having no difficulty with hemodialysis. He is here today discuss other options for dialysis access. He has severe dementia and has had so for years. He is having marked overall clinical deterioration with the barely able to walk and is unable to communicate. He is here with his wife and daughter.  Past Medical History:  Diagnosis Date  . Anemia   . Asthma   . Dementia   . Diabetes mellitus    type 2  . ESRD (end stage renal disease) ([place])    Dialysis on MONDAY, [place] and FRIDAYS  . Flu 08/2014  . GERD (gastroesophageal reflux disease)   . History of blood transfusion   . HOH (hard of hearing)   . Hx of echocardiogram    a.  Echocardiogram (09/10/2013): EF 50-55%, normal wall motion, grade 2 diastolic dysfunction, mild aortic stenosis, trivial AI, MAC, moderate MR, mild LAE, moderate to severe TR, moderately increased PASP.  [name] Hx of non-ST elevation myocardial infarction (NSTEMI)    a. in setting of influenza, volume overload 08/2013 => Lexiscan Myoview (09/11/2013): Subtle ischemia in the anteroseptal wall, EF 44%. => med Rx recommended  . Hyperlipidemia   . Hypertension   . Shortness of breath    with exertion    Family History  Problem Relation Age of Onset  . Cancer Mother   . Hypertension Mother   . Cancer Father   . Diabetes Father   . Hypertension Father   . Diabetes Son   . Diabetes Daughter   . Diabetes Daughter     . Diabetes Cousin     SOCIAL HISTORY: Social History  Substance Use Topics  . Smoking status: Never Smoker  . Smokeless tobacco: Never Used  . Alcohol use No    No Known Allergies  Current Outpatient Prescriptions  Medication Sig Dispense Refill  . atorvastatin (LIPITOR) 80 MG tablet Take 80 mg by mouth daily.     . calcium acetate (PHOSLO) 667 MG capsule Take 667 mg by mouth 3 (three) times daily with meals.     . CV[facility] ASPIRIN LOW DOSE 81 MG EC tablet TAKE 1 TABLET (81 MG TOTAL) BY MOUTH DAILY. NEED OV. 15 tablet 0  . darbepoetin (ARANESP) 100 MCG/0.5ML SOLN injection Inject 0.5 mLs (100 mcg total) into the vein every Monday with hemodialysis. 4.2 mL   . Insulin Isophane Human (HUMULIN N PEN [place]) Inject 5-10 Units into the skin 2 (two) times daily.     [name] labetalol (NORMODYNE) 300 MG tablet Take 300 mg by mouth once.     . memantine (NAMENDA) 5 MG tablet Take 5 mg by mouth daily.   3  . omeprazole (PRILOSEC) 20 MG capsule Take 1 capsule (20 mg total) by mouth daily. 30 capsule 1  . PROAIR HFA 108 (90 Base) MCG/ACT inhaler Inhale 2 puffs into the lungs 3 (three) times daily.     . risperiDONE (RISPERDAL) 0.5 MG tablet Take 1 mg by mouth at  bedtime.      No current facility-administered medications for this visit.     REVIEW OF SYSTEMS:  _0  denotes positive finding, _1  denotes negative finding Cardiac  Comments:  Chest pain or chest pressure:    Shortness of breath upon exertion:    Short of breath when lying flat:    Irregular heart rhythm:        Vascular    Pain in calf, thigh, or hip brought on by ambulation:    Pain in feet at night that wakes you up from your sleep:     Blood clot in your veins:    Leg swelling:           PHYSICAL EXAM: Vitals:   02/28/17 1045  BP: 137/74  Pulse: 84  Resp: 16  Temp: 98.3 F (36.8 C)  TempSrc: Oral  SpO2: 99%  Weight: 124 lb (56.2 kg)  Height: _2  (1.6 m)    GENERAL: The patient is a well-nourished male, in no acute  distress. The vital signs are documented above. CARDIOVASCULAR: Left upper arm aneurysmal AV fistula. There is a pulse in the lower aneurysmal segment and thrombosis of the upper aneurysmal segment. He does have a 1-2+ right radial pulse with small surface veins PULMONARY: There is good air exchange  MUSCULOSKELETAL: There are no major deformities or cyanosis. NEUROLOGIC: No focal weakness or paresthesias are detected. SKIN: There are no ulcers or rashes noted. PSYCHIATRIC: The patient has a normal affect.    MEDICAL ISSUES: Overall clinical decline in this 83 year old gentleman. Severe dementia, weight loss and known severe coronary disease. I certainly would not recommend new access placement. He has had no difficulty with his catheter either with infection or with flow. Would recommend lifelong use of his catheter and would only consider upper extremity access if he begins having difficulty with this. We are available as needed for further treatment options    Rosetta Posner, MD [facility] Vascular and Vein Specialists of [facility] Tel [phone] Pager [phone]

## 2017-03-16 ENCOUNTER — Emergency Department (HOSPITAL_COMMUNITY): Payer: Medicare Other

## 2017-03-16 ENCOUNTER — Inpatient Hospital Stay (HOSPITAL_COMMUNITY)
Admission: EM | Admit: 2017-03-16 | Discharge: 2017-03-21 | DRG: 469 | Disposition: A | Discharge status: Skilled Nursing Facility | Payer: Medicare Other | Attending: Internal Medicine | Admitting: Internal Medicine

## 2017-03-16 ENCOUNTER — Ambulatory Visit (INDEPENDENT_AMBULATORY_CARE_PROVIDER_SITE_OTHER): Payer: Medicare Other | Admitting: Orthopedic Surgery

## 2017-03-16 ENCOUNTER — Encounter (HOSPITAL_COMMUNITY): Payer: Self-pay | Admitting: Emergency Medicine

## 2017-03-16 DIAGNOSIS — Y92009 Unspecified place in unspecified non-institutional (private) residence as the place of occurrence of the external cause: Secondary | ICD-10-CM

## 2017-03-16 DIAGNOSIS — L97511 Non-pressure chronic ulcer of other part of right foot limited to breakdown of skin: Secondary | ICD-10-CM

## 2017-03-16 DIAGNOSIS — Z89431 Acquired absence of right foot: Secondary | ICD-10-CM

## 2017-03-16 DIAGNOSIS — E1122 Type 2 diabetes mellitus with diabetic chronic kidney disease: Secondary | ICD-10-CM | POA: Diagnosis present

## 2017-03-16 DIAGNOSIS — I10 Essential (primary) hypertension: Secondary | ICD-10-CM | POA: Diagnosis present

## 2017-03-16 DIAGNOSIS — N186 End stage renal disease: Secondary | ICD-10-CM | POA: Diagnosis present

## 2017-03-16 DIAGNOSIS — S72002A Fracture of unspecified part of neck of left femur, initial encounter for closed fracture: Principal | ICD-10-CM

## 2017-03-16 DIAGNOSIS — R05 Cough: Secondary | ICD-10-CM

## 2017-03-16 DIAGNOSIS — R059 Cough, unspecified: Secondary | ICD-10-CM

## 2017-03-16 DIAGNOSIS — E1149 Type 2 diabetes mellitus with other diabetic neurological complication: Secondary | ICD-10-CM | POA: Diagnosis present

## 2017-03-16 DIAGNOSIS — Z419 Encounter for procedure for purposes other than remedying health state, unspecified: Secondary | ICD-10-CM

## 2017-03-16 DIAGNOSIS — F03B0 Unspecified dementia, moderate, without behavioral disturbance, psychotic disturbance, mood disturbance, and anxiety: Secondary | ICD-10-CM | POA: Diagnosis present

## 2017-03-16 DIAGNOSIS — W1830XA Fall on same level, unspecified, initial encounter: Secondary | ICD-10-CM | POA: Diagnosis present

## 2017-03-16 DIAGNOSIS — D638 Anemia in other chronic diseases classified elsewhere: Secondary | ICD-10-CM | POA: Diagnosis present

## 2017-03-16 DIAGNOSIS — I252 Old myocardial infarction: Secondary | ICD-10-CM

## 2017-03-16 DIAGNOSIS — F0390 Unspecified dementia without behavioral disturbance: Secondary | ICD-10-CM | POA: Diagnosis present

## 2017-03-16 DIAGNOSIS — Z992 Dependence on renal dialysis: Secondary | ICD-10-CM

## 2017-03-16 DIAGNOSIS — Z96649 Presence of unspecified artificial hip joint: Secondary | ICD-10-CM

## 2017-03-16 DIAGNOSIS — K219 Gastro-esophageal reflux disease without esophagitis: Secondary | ICD-10-CM | POA: Diagnosis present

## 2017-03-16 DIAGNOSIS — Z79899 Other long term (current) drug therapy: Secondary | ICD-10-CM

## 2017-03-16 LAB — CBC WITH DIFFERENTIAL/PLATELET
Basophils Absolute: 0 10*3/uL (ref 0.0–0.1)
Basophils Relative: 0 %
EOS ABS: 0 10*3/uL (ref 0.0–0.7)
Eosinophils Relative: 0 %
HEMATOCRIT: 34.7 % — AB (ref 39.0–52.0)
HEMOGLOBIN: 11.1 g/dL — AB (ref 13.0–17.0)
LYMPHS ABS: 1.4 10*3/uL (ref 0.7–4.0)
LYMPHS PCT: 10 %
MCH: 28.2 pg (ref 26.0–34.0)
MCHC: 32 g/dL (ref 30.0–36.0)
MCV: 88.1 fL (ref 78.0–100.0)
MONOS PCT: 6 %
Monocytes Absolute: 0.8 10*3/uL (ref 0.1–1.0)
NEUTROS PCT: 85 %
Neutro Abs: 12 10*3/uL — ABNORMAL HIGH (ref 1.7–7.7)
Platelets: 141 10*3/uL — ABNORMAL LOW (ref 150–400)
RBC: 3.94 MIL/uL — AB (ref 4.22–5.81)
RDW: 17.4 % — ABNORMAL HIGH (ref 11.5–15.5)
WBC: 14.1 10*3/uL — AB (ref 4.0–10.5)

## 2017-03-16 LAB — BASIC METABOLIC PANEL
Anion gap: 10 (ref 5–15)
BUN: 11 mg/dL (ref 6–20)
CHLORIDE: 100 mmol/L — AB (ref 101–111)
CO2: 26 mmol/L (ref 22–32)
Calcium: 8.2 mg/dL — ABNORMAL LOW (ref 8.9–10.3)
Creatinine, Ser: 3.7 mg/dL — ABNORMAL HIGH (ref 0.61–1.24)
GFR calc Af Amer: 15 mL/min — ABNORMAL LOW (ref >60)
GFR calc non Af Amer: 13 mL/min — ABNORMAL LOW (ref >60)
GLUCOSE: 128 mg/dL — AB (ref 65–99)
POTASSIUM: 3.4 mmol/L — AB (ref 3.5–5.1)
SODIUM: 136 mmol/L (ref 135–145)

## 2017-03-16 MED ORDER — FENTANYL CITRATE (PF) 100 MCG/2ML IJ SOLN
50.0000 ug | Freq: Once | INTRAMUSCULAR | Status: AC
  Administered 2017-03-16: 50 ug via INTRAVENOUS
  Filled 2017-03-16: qty 2

## 2017-03-16 NOTE — Progress Notes (Signed)
Office Visit Note   Patient: Steven Forbes           Date of Birth: May 21, 1926           MRN: [id number] Visit Date: 03/16/2017              Requested by: Shon Baton, [place] [place], [place] [id number] PCP: Shon Baton, MD  No chief complaint on file.     HPI: Patient is a 83 year old gentleman status post right transmetatarsal amputation with ulceration medially and laterally. Patient's wife states that he has progressive weakness and was recently hospitalized for pneumonia needs assistance to get up from a sitting to standing position and has increasing dementia.  Assessment & Plan: Visit Diagnoses:  1. S/P transmetatarsal amputation of foot, right ([place])   2. Ulcer of right foot, limited to breakdown of skin ([place])     Plan: Ulcer debridement of skin and soft tissue patient's wife will use soap and water antibiotic ointment and a Band-Aid daily. Patient is currently essentially ambulating in a wheelchair.  Follow-Up Instructions: Return in about 3 months (around 06/16/2017).   Ortho Exam  Patient is alert, oriented, no adenopathy, well-dressed, normal affect, normal respiratory effort. Examination patient needs assistance when 2 people get from a sitting to standing position. He is not using assistive device. Examination of his right foot he has recurrent ulceration over the medial lateral column of the transmetatarsal amputation there is no ascending cellulitis. After informed consent a 10 blade knife was used to debride the skin and soft tissue from the medial and lateral ulcer. The ulcer extended down to soft tissue. After debridement the ulcer was 10 mm in diameter and 3 mm deep silver nitrate  is used for hemostasis Iodosorb and a Band-Aid was applied the medial ulcer,  the lateral ulcer is 5 x 10 and 1 mm deep.  Imaging: No results found.  Labs: Lab Results  Component Value Date   HGBA1C 6.2 (H) 09/09/2013   HGBA1C  04/17/2007    5.5 (NOTE)   The ADA  recommends the following therapeutic goals for glycemic   control related to Hgb A1C measurement:   Goal of Therapy:   < 7.0% Hgb A1C   Action Suggested:  > 8.0% Hgb A1C   Ref:  Diabetes Care, 22, Suppl. 1, 1999   REPTSTATUS 09/15/2013 FINAL 09/09/2013   CULT  09/09/2013    NO GROWTH 5 DAYS Performed at [facility]    Orders:  No orders of the defined types were placed in this encounter.  No orders of the defined types were placed in this encounter.    Procedures: No procedures performed  Clinical Data: No additional findings.  ROS:  All other systems negative, except as noted in the HPI. Review of Systems  Objective: Vital Signs: There were no vitals taken for this visit.  Specialty Comments:  No specialty comments available.  PMFS History: Patient Active Problem List   Diagnosis Date Noted  . Ulcer of right foot, limited to breakdown of skin ([place]) 03/16/2017  . Acute encephalopathy   . HCAP (healthcare-associated pneumonia) 01/17/2017  . Diabetes with neurologic complications ([place]) 93/23/5573  . Moderate dementia 01/21/2015  . Aortic stenosis 07/08/2014  . CAD (coronary artery disease) 01/07/2014  . Essential hypertension 01/07/2014  . Hyperlipidemia 01/07/2014  . End stage renal disease ([place]) 12/11/2013  . Protein-calorie malnutrition, severe ([place]) 09/11/2013  . Transaminitis 09/10/2013  . Respiratory distress 09/09/2013  . NSTEMI (non-ST elevated  myocardial infarction) ([place]) 09/09/2013  . ESRD on dialysis ([place]) 09/09/2013  . possible Pneumonia 09/09/2013  . Influenza B 09/09/2013  . Acute CHF ([place]) 09/09/2013  . Acute myocardial infarction, subendocardial infarction, initial episode of care ([place]) 09/09/2013  . Secondary hyperparathyroidism ([place]) 09/09/2013  . Atherosclerotic peripheral vascular disease ([place]) 09/09/2013  . GASTRIC POLYP 12/08/2008  . HEMORRHOIDS-INTERNAL 12/08/2008  . DIABETES MELLITUS 11/25/2008  . ANEMIA 11/25/2008  . COLONIC  POLYPS, HX OF 11/25/2008   Past Medical History:  Diagnosis Date  . Anemia   . Asthma   . Dementia   . Diabetes mellitus    type 2  . ESRD (end stage renal disease) ([place])    Dialysis on MONDAY, [place] and FRIDAYS  . Flu 08/2014  . GERD (gastroesophageal reflux disease)   . History of blood transfusion   . HOH (hard of hearing)   . Hx of echocardiogram    a.  Echocardiogram (09/10/2013): EF 50-55%, normal wall motion, grade 2 diastolic dysfunction, mild aortic stenosis, trivial AI, MAC, moderate MR, mild LAE, moderate to severe TR, moderately increased PASP.  [name] Hx of non-ST elevation myocardial infarction (NSTEMI)    a. in setting of influenza, volume overload 08/2013 => Lexiscan Myoview (09/11/2013): Subtle ischemia in the anteroseptal wall, EF 44%. => med Rx recommended  . Hyperlipidemia   . Hypertension   . Shortness of breath    with exertion    Family History  Problem Relation Age of Onset  . Cancer Mother   . Hypertension Mother   . Cancer Father   . Diabetes Father   . Hypertension Father   . Diabetes Son   . Diabetes Daughter   . Diabetes Daughter   . Diabetes Cousin     Past Surgical History:  Procedure Laterality Date  . AV FISTULA PLACEMENT  2008   Left Upper Arm  . COLONOSCOPY    . EYE SURGERY Bilateral    cataract surgery  . LAPAROSCOPIC GASTROTOMY W/ REPAIR OF ULCER    . REVISON OF ARTERIOVENOUS FISTULA Left 11/21/2013   Procedure: REVISION OF ARTERIOVENOUS FISTULA;  Surgeon: Angelia Mould, MD;  Location: [place];  Service: Vascular;  Laterality: Left;  . REVISON OF ARTERIOVENOUS FISTULA Left 02/18/2016   Procedure: excision of ulcerated skin over left brachio-cephalic AV fistula x 2;  Surgeon: [name] Misty, MD;  Location: [place];  Service: Vascular;  Laterality: Left;  . TOE AMPUTATION Right    all toes   Social History   Occupational History  . Retired    Social History Main Topics  . Smoking status: Never Smoker  . Smokeless tobacco:  Never Used  . Alcohol use No  . Drug use: No  . Sexual activity: Not on file

## 2017-03-16 NOTE — ED Provider Notes (Signed)
[facility] DEPT Provider Note   CSN: [id number] Arrival date & time: 03/16/17  2135     History   Chief Complaint Chief Complaint  Patient presents with  . Fall  . Hip Pain    HPI Steven Forbes is a 83 y.o. male.  Patient with past medical history of dementia, HTN, HL, diabetes, ESRD on HD (Monday, Wednesday, Friday), presents to the emergency department with chief complaint of hip pain.  History is limited secondary to dementia. Some history is provided by family members, who report that the patient had an unwitnessed fall at home today. He lives at home with his wife. Patient complains of left hip pain. He has been unable to ambulate or bear weight since the fall. Level V caveat applies secondary to dementia.   The history is provided by the patient. No language interpreter was used.    Past Medical History:  Diagnosis Date  . Anemia   . Asthma   . Dementia   . Diabetes mellitus    type 2  . ESRD (end stage renal disease) ([place])    Dialysis on MONDAY, [place] and FRIDAYS  . Flu 08/2014  . GERD (gastroesophageal reflux disease)   . History of blood transfusion   . HOH (hard of hearing)   . Hx of echocardiogram    a.  Echocardiogram (09/10/2013): EF 50-55%, normal wall motion, grade 2 diastolic dysfunction, mild aortic stenosis, trivial AI, MAC, moderate MR, mild LAE, moderate to severe TR, moderately increased PASP.  [name] Hx of non-ST elevation myocardial infarction (NSTEMI)    a. in setting of influenza, volume overload 08/2013 => Lexiscan Myoview (09/11/2013): Subtle ischemia in the anteroseptal wall, EF 44%. => med Rx recommended  . Hyperlipidemia   . Hypertension   . Shortness of breath    with exertion    Patient Active Problem List   Diagnosis Date Noted  . Ulcer of right foot, limited to breakdown of skin ([place]) 03/16/2017  . Acute encephalopathy   . HCAP (healthcare-associated pneumonia) 01/17/2017  . Diabetes with neurologic complications ([place])  99/37/1696  . Moderate dementia 01/21/2015  . Aortic stenosis 07/08/2014  . CAD (coronary artery disease) 01/07/2014  . Essential hypertension 01/07/2014  . Hyperlipidemia 01/07/2014  . End stage renal disease ([place]) 12/11/2013  . Protein-calorie malnutrition, severe ([place]) 09/11/2013  . Transaminitis 09/10/2013  . Respiratory distress 09/09/2013  . NSTEMI (non-ST elevated myocardial infarction) ([place]) 09/09/2013  . ESRD on dialysis ([place]) 09/09/2013  . possible Pneumonia 09/09/2013  . Influenza B 09/09/2013  . Acute CHF ([place]) 09/09/2013  . Acute myocardial infarction, subendocardial infarction, initial episode of care ([place]) 09/09/2013  . Secondary hyperparathyroidism ([place]) 09/09/2013  . Atherosclerotic peripheral vascular disease ([place]) 09/09/2013  . GASTRIC POLYP 12/08/2008  . HEMORRHOIDS-INTERNAL 12/08/2008  . DIABETES MELLITUS 11/25/2008  . ANEMIA 11/25/2008  . COLONIC POLYPS, HX OF 11/25/2008    Past Surgical History:  Procedure Laterality Date  . AV FISTULA PLACEMENT  2008   Left Upper Arm  . COLONOSCOPY    . EYE SURGERY Bilateral    cataract surgery  . LAPAROSCOPIC GASTROTOMY W/ REPAIR OF ULCER    . REVISON OF ARTERIOVENOUS FISTULA Left 11/21/2013   Procedure: REVISION OF ARTERIOVENOUS FISTULA;  Surgeon: Angelia Mould, MD;  Location: [facility];  Service: Vascular;  Laterality: Left;  . REVISON OF ARTERIOVENOUS FISTULA Left 02/18/2016   Procedure: excision of ulcerated skin over left brachio-cephalic AV fistula x 2;  Surgeon: [name] Misty, MD;  Location: [facility]  OR;  Service: Vascular;  Laterality: Left;  . TOE AMPUTATION Right    all toes       Home Medications    Prior to Admission medications   Medication Sig Start Date End Date Taking? Authorizing Provider  atorvastatin (LIPITOR) 80 MG tablet Take 80 mg by mouth daily.  09/23/13   [provider]  calcium acetate (PHOSLO) 667 MG capsule Take 667 mg by mouth 3 (three) times daily with meals.  07/10/16    [provider]  [facility] ASPIRIN LOW DOSE 81 MG EC tablet TAKE 1 TABLET (81 MG TOTAL) BY MOUTH DAILY. NEED OV. 11/28/16   Lelon Perla, MD  darbepoetin (ARANESP) 100 MCG/0.5ML SOLN injection Inject 0.5 mLs (100 mcg total) into the vein every Monday with hemodialysis. 09/13/13   Charlynne Cousins, MD  Insulin Isophane Human (HUMULIN N PEN [place]) Inject 5-10 Units into the skin 2 (two) times daily.     [provider]  labetalol (NORMODYNE) 300 MG tablet Take 300 mg by mouth once.     [provider]  memantine (NAMENDA) 5 MG tablet Take 5 mg by mouth daily.  12/23/14   [provider]  omeprazole (PRILOSEC) 20 MG capsule Take 1 capsule (20 mg total) by mouth daily. 01/19/17   Reyne Dumas, MD  PROAIR HFA 108 (90 Base) MCG/ACT inhaler Inhale 2 puffs into the lungs 3 (three) times daily.  12/14/15   [provider]  risperiDONE (RISPERDAL) 0.5 MG tablet Take 1 mg by mouth at bedtime.  01/13/15   [provider]    Family History Family History  Problem Relation Age of Onset  . Cancer Mother   . Hypertension Mother   . Cancer Father   . Diabetes Father   . Hypertension Father   . Diabetes Son   . Diabetes Daughter   . Diabetes Daughter   . Diabetes Cousin     Social History Social History  Substance Use Topics  . Smoking status: Never Smoker  . Smokeless tobacco: Never Used  . Alcohol use No     Allergies   Patient has no known allergies.   Review of Systems Review of Systems  Unable to perform ROS: Dementia     Physical Exam Updated Vital Signs Ht _0  (1.6 m)   Wt 54.4 kg (120 lb)   BMI 21.26 kg/m   Physical Exam  Constitutional: He is oriented to person, place, and time. He appears well-developed and well-nourished.  HENT:  Head: Normocephalic and atraumatic.  Eyes: Conjunctivae and EOM are normal. Pupils are equal, round, and reactive to light. Right eye exhibits no discharge. Left eye exhibits no discharge. No  scleral icterus.  Neck: Normal range of motion. Neck supple. No JVD present.  Cardiovascular: Normal rate, regular rhythm and normal heart sounds.  Exam reveals no gallop and no friction rub.   No murmur heard. Pulmonary/Chest: Effort normal and breath sounds normal. No respiratory distress. He has no wheezes. He has no rales. He exhibits no tenderness.  Abdominal: Soft. He exhibits no distension and no mass. There is no tenderness. There is no rebound and no guarding.  Musculoskeletal: Normal range of motion. He exhibits edema. He exhibits no tenderness.  Left hip tender to palpation LLE appears shortened and externally rotated ROM and strength deferred Mild peripheral edema RLE remarkable for remote transmetatarsal amputation  Neurological: He is alert and oriented to person, place, and time.  Skin: Skin is warm and dry.  Psychiatric: He has a normal mood and affect. His behavior is normal. Judgment and thought content normal.  Nursing note and vitals reviewed.    ED Treatments / Results  Labs (all labs ordered are listed, but only abnormal results are displayed) Labs Reviewed  CBC WITH DIFFERENTIAL/PLATELET - Abnormal; Notable for the following:       Result Value   WBC 14.1 (*)    RBC 3.94 (*)    Hemoglobin 11.1 (*)    HCT 34.7 (*)    RDW 17.4 (*)    Platelets 141 (*)    Neutro Abs 12.0 (*)    All other components within normal limits  BASIC METABOLIC PANEL - Abnormal; Notable for the following:    Potassium 3.4 (*)    Chloride 100 (*)    Glucose, Bld 128 (*)    Creatinine, Ser 3.70 (*)    Calcium 8.2 (*)    GFR calc non Af Amer 13 (*)    GFR calc Af Amer 15 (*)    All other components within normal limits    EKG  EKG Interpretation None       Radiology Dg Chest 1 View  Result Date: 03/16/2017 CLINICAL DATA:  Decreased mobility after an unwitnessed fall at home. EXAM: CHEST 1 VIEW COMPARISON:  01/17/2017 FINDINGS: Right jugular dual-lumen catheter extends  into the right atrium. Mild cardiomegaly, unchanged. The lungs are clear. The pulmonary vasculature is normal. No pleural effusion. IMPRESSION: Stable cardiomegaly.  No acute findings. Electronically Signed   By: Andreas Newport M.D.   On: 03/16/2017 22:50   Ct Head Wo Contrast  Result Date: 03/16/2017 CLINICAL DATA:  83 year old male with under witnessed fall at home. Altered mental status. EXAM: CT HEAD WITHOUT CONTRAST CT CERVICAL SPINE WITHOUT CONTRAST TECHNIQUE: Multidetector CT imaging of the head and cervical spine was performed following the standard protocol without intravenous contrast. Multiplanar CT image reconstructions of the cervical spine were also generated. COMPARISON:  01/17/2017 and prior head CTs FINDINGS: CT HEAD FINDINGS Brain: No evidence of acute infarction, hemorrhage, hydrocephalus, extra-axial collection or mass lesion/mass effect. Atrophy and chronic small-vessel white matter ischemic changes noted. Vascular: Intracranial atherosclerotic calcifications noted. Skull: Normal. Negative for fracture or focal lesion. Sinuses/Orbits: No acute finding. Other: None. CT CERVICAL SPINE FINDINGS Alignment: 2 mm anterolisthesis of C4 on C5 is degenerative. No acute subluxation noted. Skull base and vertebrae: No acute fracture. No primary bone lesion or focal pathologic process. Soft tissues and spinal canal: No prevertebral fluid or swelling. No visible canal hematoma. Disc levels:  Severe degenerative disc disease from C4-C7 noted. Upper chest: Negative. Other: None IMPRESSION: No evidence of acute intracranial abnormality. Atrophy and chronic small-vessel white matter ischemic changes. No static evidence of acute injury to the cervical spine. Severe degenerative disc disease from C4-C7. Electronically Signed   By: Margarette Canada M.D.   On: 03/16/2017 23:18   Ct Cervical Spine Wo Contrast  Result Date: 03/16/2017 CLINICAL DATA:  83 year old male with under witnessed fall at home. Altered  mental status. EXAM: CT HEAD WITHOUT CONTRAST CT CERVICAL SPINE WITHOUT CONTRAST TECHNIQUE: Multidetector CT imaging of the head and cervical spine was performed following the standard protocol without intravenous contrast. Multiplanar CT image reconstructions of the cervical spine were also generated. COMPARISON:  01/17/2017 and prior head CTs FINDINGS: CT HEAD FINDINGS Brain: No evidence of acute infarction, hemorrhage, hydrocephalus, extra-axial collection or mass lesion/mass effect. Atrophy and chronic small-vessel white matter ischemic changes noted. Vascular: Intracranial atherosclerotic calcifications  noted. Skull: Normal. Negative for fracture or focal lesion. Sinuses/Orbits: No acute finding. Other: None. CT CERVICAL SPINE FINDINGS Alignment: 2 mm anterolisthesis of C4 on C5 is degenerative. No acute subluxation noted. Skull base and vertebrae: No acute fracture. No primary bone lesion or focal pathologic process. Soft tissues and spinal canal: No prevertebral fluid or swelling. No visible canal hematoma. Disc levels:  Severe degenerative disc disease from C4-C7 noted. Upper chest: Negative. Other: None IMPRESSION: No evidence of acute intracranial abnormality. Atrophy and chronic small-vessel white matter ischemic changes. No static evidence of acute injury to the cervical spine. Severe degenerative disc disease from C4-C7. Electronically Signed   By: Margarette Canada M.D.   On: 03/16/2017 23:18   Dg Hip Unilat W Or W/o Pelvis Min 4 Views Left  Result Date: 03/16/2017 CLINICAL DATA:  Fall EXAM: DG HIP (WITH OR WITHOUT PELVIS) 4+V LEFT COMPARISON:  None. FINDINGS: There is a transverse fracture of the left femoral neck with approximately 2 cm of overriding. The femoral head remains situated in the acetabulum. No pelvic diastasis. No acute abnormality of the right hip. IMPRESSION: Left femoral neck transverse fracture Electronically Signed   By: Ulyses Jarred M.D.   On: 03/16/2017 22:50     Procedures Procedures (including critical care time)  Medications Ordered in ED Medications  fentaNYL (SUBLIMAZE) injection 50 mcg (not administered)     Initial Impression / Assessment and Plan / ED Course  I have reviewed the triage vital signs and the nursing notes.  Pertinent labs & imaging results that were available during my care of the patient were reviewed by me and considered in my medical decision making (see chart for details).     Patient with unwitnessed fall earlier today. Left hip is remarkable for femoral neck fracture. No other apparent injuries.  Patient discussed with Dr. Percell Miller from orthopedics, who will see the patient morning.  Appreciate Dr. Alcario Drought for admitting the patient.  Final Clinical Impressions(s) / ED Diagnoses   Final diagnoses:  Closed fracture of neck of left femur, initial encounter [facility])    New Prescriptions New Prescriptions   No medications on file     Montine Circle, Hershal Coria 03/17/17 0105    Davonna Belling, MD 03/19/17 [phone]

## 2017-03-16 NOTE — ED Triage Notes (Signed)
Per EMS, patient had unwitnessed fall at home.  Found on back. No LOC.  Patient does have significant dementia. Family noticed patient was less mobile and more hesitant to stand or move around on left leg.  No shortening or rotation noted. Patient is on dialysis.  Next dialysis is tomorrow.  Catheter on right shoulder.  154/82, 77 HR, 100 % RA, CBG 210, RR 16.

## 2017-03-17 ENCOUNTER — Inpatient Hospital Stay (HOSPITAL_COMMUNITY): Payer: Medicare Other

## 2017-03-17 DIAGNOSIS — E0840 Diabetes mellitus due to underlying condition with diabetic neuropathy, unspecified: Secondary | ICD-10-CM

## 2017-03-17 DIAGNOSIS — R195 Other fecal abnormalities: Secondary | ICD-10-CM | POA: Diagnosis not present

## 2017-03-17 DIAGNOSIS — F0390 Unspecified dementia without behavioral disturbance: Secondary | ICD-10-CM | POA: Diagnosis present

## 2017-03-17 DIAGNOSIS — Z992 Dependence on renal dialysis: Secondary | ICD-10-CM | POA: Diagnosis not present

## 2017-03-17 DIAGNOSIS — E1122 Type 2 diabetes mellitus with diabetic chronic kidney disease: Secondary | ICD-10-CM | POA: Diagnosis present

## 2017-03-17 DIAGNOSIS — R011 Cardiac murmur, unspecified: Secondary | ICD-10-CM | POA: Diagnosis not present

## 2017-03-17 DIAGNOSIS — W1830XA Fall on same level, unspecified, initial encounter: Secondary | ICD-10-CM | POA: Diagnosis present

## 2017-03-17 DIAGNOSIS — I10 Essential (primary) hypertension: Secondary | ICD-10-CM

## 2017-03-17 DIAGNOSIS — S72002A Fracture of unspecified part of neck of left femur, initial encounter for closed fracture: Secondary | ICD-10-CM | POA: Diagnosis present

## 2017-03-17 DIAGNOSIS — N186 End stage renal disease: Secondary | ICD-10-CM | POA: Diagnosis present

## 2017-03-17 DIAGNOSIS — D638 Anemia in other chronic diseases classified elsewhere: Secondary | ICD-10-CM | POA: Diagnosis present

## 2017-03-17 DIAGNOSIS — I252 Old myocardial infarction: Secondary | ICD-10-CM | POA: Diagnosis not present

## 2017-03-17 DIAGNOSIS — D72829 Elevated white blood cell count, unspecified: Secondary | ICD-10-CM | POA: Diagnosis not present

## 2017-03-17 DIAGNOSIS — K219 Gastro-esophageal reflux disease without esophagitis: Secondary | ICD-10-CM | POA: Diagnosis present

## 2017-03-17 DIAGNOSIS — Z79899 Other long term (current) drug therapy: Secondary | ICD-10-CM | POA: Diagnosis not present

## 2017-03-17 DIAGNOSIS — Y92009 Unspecified place in unspecified non-institutional (private) residence as the place of occurrence of the external cause: Secondary | ICD-10-CM | POA: Diagnosis not present

## 2017-03-17 LAB — ECHOCARDIOGRAM COMPLETE
AO mean calculated velocity dopler: 196 cm/s
AOPV: 0.39 m/s
AV Area VTI index: 0.76 cm2/m2
AV Area mean vel: 0.99 cm2
AV Mean grad: 18 mmHg
AV area mean vel ind: 0.64 cm2/m2
AV vel: 1.19
AVA: 1.19 cm2
AVAREAVTI: 1.11 cm2
AVCELMEANRAT: 0.35
AVPG: 35 mmHg
AVPKVEL: 295 cm/s
CHL CUP AV PEAK INDEX: 0.71
FS: 25 % — AB (ref 28–44)
Height: 63 in
IVS/LV PW RATIO, ED: 0.9
LA ID, A-P, ES: 38 mm
LA diam index: 2.44 cm/m2
LA vol A4C: 50.5 ml
LA vol: 55.3 mL
LAVOLIN: 35.4 mL/m2
LDCA: 2.84 cm2
LEFT ATRIUM END SYS DIAM: 38 mm
LV PW d: 10 mm — AB (ref 0.6–1.1)
LVOT VTI: 23.5 cm
LVOT peak VTI: 0.42 cm
LVOT peak grad rest: 5 mmHg
LVOT peak vel: 115 cm/s
LVOTD: 19 mm
LVOTSV: 67 mL
MRPISAEROA: 0.09 cm2
MV pk A vel: 180 m/s
MV pk E vel: 109 m/s
MVPG: 5 mmHg
P 1/2 time: 282 ms
RV LATERAL S' VELOCITY: 8.81 cm/s
RV sys press: 32 mmHg
Reg peak vel: 271 cm/s
TR max vel: 271 cm/s
VTI: 226 cm
VTI: 56 cm
Valve area index: 0.76
Weight: 1920 oz

## 2017-03-17 LAB — RENAL FUNCTION PANEL
Albumin: 1.7 g/dL — ABNORMAL LOW (ref 3.5–5.0)
Anion gap: 10 (ref 5–15)
BUN: 15 mg/dL (ref 6–20)
CO2: 26 mmol/L (ref 22–32)
Calcium: 8 mg/dL — ABNORMAL LOW (ref 8.9–10.3)
Chloride: 99 mmol/L — ABNORMAL LOW (ref 101–111)
Creatinine, Ser: 4.57 mg/dL — ABNORMAL HIGH (ref 0.61–1.24)
GFR calc Af Amer: 12 mL/min — ABNORMAL LOW (ref >60)
GFR calc non Af Amer: 10 mL/min — ABNORMAL LOW (ref >60)
Glucose, Bld: 138 mg/dL — ABNORMAL HIGH (ref 65–99)
Phosphorus: 3.6 mg/dL (ref 2.5–4.6)
Potassium: 2.9 mmol/L — ABNORMAL LOW (ref 3.5–5.1)
Sodium: 135 mmol/L (ref 135–145)

## 2017-03-17 LAB — CBC
HCT: 31.6 % — ABNORMAL LOW (ref 39.0–52.0)
Hemoglobin: 10.2 g/dL — ABNORMAL LOW (ref 13.0–17.0)
MCH: 28.2 pg (ref 26.0–34.0)
MCHC: 32.3 g/dL (ref 30.0–36.0)
MCV: 87.3 fL (ref 78.0–100.0)
Platelets: 145 K/uL — ABNORMAL LOW (ref 150–400)
RBC: 3.62 MIL/uL — ABNORMAL LOW (ref 4.22–5.81)
RDW: 17.2 % — ABNORMAL HIGH (ref 11.5–15.5)
WBC: 13.6 K/uL — ABNORMAL HIGH (ref 4.0–10.5)

## 2017-03-17 LAB — GLUCOSE, CAPILLARY
GLUCOSE-CAPILLARY: 118 mg/dL — AB (ref 65–99)
Glucose-Capillary: 92 mg/dL (ref 65–99)
Glucose-Capillary: 85 mg/dL (ref 65–99)

## 2017-03-17 LAB — CBG MONITORING, ED: Glucose-Capillary: 107 mg/dL — ABNORMAL HIGH (ref 65–99)

## 2017-03-17 MED ORDER — LABETALOL HCL 5 MG/ML IV SOLN
5.0000 mg | INTRAVENOUS | Status: DC | PRN
  Filled 2017-03-17: qty 4

## 2017-03-17 MED ORDER — BOOST / RESOURCE BREEZE PO LIQD
1.0000 | Freq: Three times a day (TID) | ORAL | Status: DC
  Administered 2017-03-18 – 2017-03-21 (×10): 1 via ORAL

## 2017-03-17 MED ORDER — PANTOPRAZOLE SODIUM 40 MG PO TBEC
40.0000 mg | DELAYED_RELEASE_TABLET | Freq: Every day | ORAL | Status: DC
  Administered 2017-03-19 – 2017-03-21 (×3): 40 mg via ORAL
  Filled 2017-03-17 (×3): qty 1

## 2017-03-17 MED ORDER — HYDROCODONE-ACETAMINOPHEN 5-325 MG PO TABS
ORAL_TABLET | ORAL | Status: AC
  Administered 2017-03-17: 2 via ORAL
  Filled 2017-03-17: qty 2

## 2017-03-17 MED ORDER — LIDOCAINE HCL (PF) 1 % IJ SOLN: 5.0000 mL | INTRAMUSCULAR | Status: DC | PRN

## 2017-03-17 MED ORDER — SODIUM CHLORIDE 0.9 % IV SOLN: 100.0000 mL | INTRAVENOUS | Status: DC | PRN

## 2017-03-17 MED ORDER — HEPARIN SODIUM (PORCINE) 1000 UNIT/ML DIALYSIS: 1000.0000 [IU] | INTRAMUSCULAR | Status: DC | PRN

## 2017-03-17 MED ORDER — PENTAFLUOROPROP-TETRAFLUOROETH EX AERO: 1.0000 "application " | INHALATION_SPRAY | CUTANEOUS | Status: DC | PRN

## 2017-03-17 MED ORDER — WHITE PETROLATUM GEL
Status: AC
  Administered 2017-03-17: 12:00:00
  Filled 2017-03-17: qty 1

## 2017-03-17 MED ORDER — ALBUTEROL SULFATE (2.5 MG/3ML) 0.083% IN NEBU
3.0000 mL | INHALATION_SOLUTION | Freq: Three times a day (TID) | RESPIRATORY_TRACT | Status: DC
  Administered 2017-03-17: 3 mL via RESPIRATORY_TRACT
  Filled 2017-03-17 (×2): qty 3

## 2017-03-17 MED ORDER — CALCIUM ACETATE (PHOS BINDER) 667 MG PO CAPS
667.0000 mg | ORAL_CAPSULE | Freq: Three times a day (TID) | ORAL | Status: DC
  Administered 2017-03-17 – 2017-03-20 (×6): 667 mg via ORAL
  Filled 2017-03-17 (×6): qty 1

## 2017-03-17 MED ORDER — LIDOCAINE-PRILOCAINE 2.5-2.5 % EX CREA: 1.0000 "application " | TOPICAL_CREAM | CUTANEOUS | Status: DC | PRN

## 2017-03-17 MED ORDER — ALTEPLASE 2 MG IJ SOLR
2.0000 mg | Freq: Once | INTRAMUSCULAR | Status: DC | PRN
Start: 2017-03-17 — End: 2017-03-17

## 2017-03-17 MED ORDER — MORPHINE SULFATE (PF) 4 MG/ML IV SOLN
0.5000 mg | INTRAVENOUS | Status: DC | PRN
  Administered 2017-03-17: 0.52 mg via INTRAVENOUS
  Filled 2017-03-17: qty 1

## 2017-03-17 MED ORDER — MEMANTINE HCL 10 MG PO TABS
5.0000 mg | ORAL_TABLET | Freq: Every day | ORAL | Status: DC
  Filled 2017-03-17 (×2): qty 1

## 2017-03-17 MED ORDER — POLYETHYLENE GLYCOL 3350 17 G PO PACK: 17.0000 g | PACK | Freq: Every day | ORAL | Status: DC | PRN

## 2017-03-17 MED ORDER — ATORVASTATIN CALCIUM 80 MG PO TABS
80.0000 mg | ORAL_TABLET | Freq: Every day | ORAL | Status: DC
  Administered 2017-03-19 – 2017-03-21 (×3): 80 mg via ORAL
  Filled 2017-03-17 (×3): qty 1

## 2017-03-17 MED ORDER — ALBUTEROL SULFATE (2.5 MG/3ML) 0.083% IN NEBU: 3.0000 mL | INHALATION_SOLUTION | Freq: Four times a day (QID) | RESPIRATORY_TRACT | Status: DC | PRN

## 2017-03-17 MED ORDER — HYDROCODONE-ACETAMINOPHEN 5-325 MG PO TABS
1.0000 | ORAL_TABLET | Freq: Four times a day (QID) | ORAL | Status: DC | PRN
  Administered 2017-03-17: 2 via ORAL
  Administered 2017-03-17: 1 via ORAL
  Filled 2017-03-17: qty 2

## 2017-03-17 MED ORDER — ASPIRIN EC 325 MG PO TBEC: 325.0000 mg | DELAYED_RELEASE_TABLET | Freq: Every day | ORAL | Status: DC

## 2017-03-17 NOTE — ED Notes (Signed)
US at bedside

## 2017-03-17 NOTE — Consult Note (Signed)
ORTHOPAEDIC CONSULTATION  REQUESTING PHYSICIAN: Bonnielee Haff, MD  Chief Complaint: Left femoral neck hip fracture  HPI: Steven Forbes is a 83 y.o. male who presents with left hip fracture s/p mechanical fall PTA.  The patient has advanced dementia, ESRD, aortic stenosis.  Patient was not medically optimized for surgery today and patient's family was unsure whether to pursue surgery or palliative care.  They decided to pursue surgery.  Dr. Percell Miller asked for my assistance in this matter.  Past Medical History:  Diagnosis Date  . Anemia   . Asthma   . Dementia   . Diabetes mellitus    type 2  . ESRD (end stage renal disease) ([place])    Dialysis on MONDAY, [place] and FRIDAYS  . Flu 08/2014  . GERD (gastroesophageal reflux disease)   . History of blood transfusion   . HOH (hard of hearing)   . Hx of echocardiogram    a.  Echocardiogram (09/10/2013): EF 50-55%, normal wall motion, grade 2 diastolic dysfunction, mild aortic stenosis, trivial AI, MAC, moderate MR, mild LAE, moderate to severe TR, moderately increased PASP.  [name] Hx of non-ST elevation myocardial infarction (NSTEMI)    a. in setting of influenza, volume overload 08/2013 => Lexiscan Myoview (09/11/2013): Subtle ischemia in the anteroseptal wall, EF 44%. => med Rx recommended  . Hyperlipidemia   . Hypertension   . Shortness of breath    with exertion   Past Surgical History:  Procedure Laterality Date  . AV FISTULA PLACEMENT  2008   Left Upper Arm  . COLONOSCOPY    . EYE SURGERY Bilateral    cataract surgery  . LAPAROSCOPIC GASTROTOMY W/ REPAIR OF ULCER    . REVISON OF ARTERIOVENOUS FISTULA Left 11/21/2013   Procedure: REVISION OF ARTERIOVENOUS FISTULA;  Surgeon: Angelia Mould, MD;  Location: [facility];  Service: Vascular;  Laterality: Left;  . REVISON OF ARTERIOVENOUS FISTULA Left 02/18/2016   Procedure: excision of ulcerated skin over left brachio-cephalic AV fistula x 2;  Surgeon: [name] Misty, MD;  Location:  [facility];  Service: Vascular;  Laterality: Left;  . TOE AMPUTATION Right    all toes   Social History   Social History  . Marital status: Married    Spouse name: N/A  . Number of children: 7  . Years of education: N/A   Occupational History  . Retired    Social History Main Topics  . Smoking status: Never Smoker  . Smokeless tobacco: Never Used  . Alcohol use No  . Drug use: No  . Sexual activity: Not Asked   Other Topics Concern  . None   Social History Narrative  . None   Family History  Problem Relation Age of Onset  . Cancer Mother   . Hypertension Mother   . Cancer Father   . Diabetes Father   . Hypertension Father   . Diabetes Son   . Diabetes Daughter   . Diabetes Daughter   . Diabetes Cousin    No Known Allergies Prior to Admission medications   Medication Sig Start Date End Date Taking? Authorizing Provider  atorvastatin (LIPITOR) 80 MG tablet Take 80 mg by mouth daily.  09/23/13  Yes [provider]  calcium acetate (PHOSLO) 667 MG capsule Take 667 mg by mouth 3 (three) times daily with meals.  07/10/16  Yes [provider]  [facility]S ASPIRIN LOW DOSE 81 MG EC tablet TAKE 1 TABLET (81 MG TOTAL) BY MOUTH DAILY. NEED OV. 11/28/16  Yes Lelon Perla, MD  darbepoetin (ARANESP) 100 MCG/0.5ML SOLN injection Inject 0.5 mLs (100 mcg total) into the vein every Monday with hemodialysis. 09/13/13  Yes Charlynne Cousins, MD  labetalol (NORMODYNE) 300 MG tablet Take 300 mg by mouth as needed (for high blood pressure).    Yes [provider]  memantine (NAMENDA) 5 MG tablet Take 5 mg by mouth daily.  12/23/14  Yes [provider]  omeprazole (PRILOSEC) 20 MG capsule Take 1 capsule (20 mg total) by mouth daily. 01/19/17  Yes Reyne Dumas, MD  risperiDONE (RISPERDAL) 0.5 MG tablet Take 1 mg by mouth at bedtime.  01/13/15  Yes [provider]  PROAIR HFA 108 (90 Base) MCG/ACT inhaler Inhale 2 puffs into the lungs 3 (three) times daily.   12/14/15   [provider]   Dg Chest 1 View  Result Date: 03/16/2017 CLINICAL DATA:  Decreased mobility after an unwitnessed fall at home. EXAM: CHEST 1 VIEW COMPARISON:  01/17/2017 FINDINGS: Right jugular dual-lumen catheter extends into the right atrium. Mild cardiomegaly, unchanged. The lungs are clear. The pulmonary vasculature is normal. No pleural effusion. IMPRESSION: Stable cardiomegaly.  No acute findings. Electronically Signed   By: Andreas Newport M.D.   On: 03/16/2017 22:50   Ct Head Wo Contrast  Result Date: 03/16/2017 CLINICAL DATA:  83 year old male with under witnessed fall at home. Altered mental status. EXAM: CT HEAD WITHOUT CONTRAST CT CERVICAL SPINE WITHOUT CONTRAST TECHNIQUE: Multidetector CT imaging of the head and cervical spine was performed following the standard protocol without intravenous contrast. Multiplanar CT image reconstructions of the cervical spine were also generated. COMPARISON:  01/17/2017 and prior head CTs FINDINGS: CT HEAD FINDINGS Brain: No evidence of acute infarction, hemorrhage, hydrocephalus, extra-axial collection or mass lesion/mass effect. Atrophy and chronic small-vessel white matter ischemic changes noted. Vascular: Intracranial atherosclerotic calcifications noted. Skull: Normal. Negative for fracture or focal lesion. Sinuses/Orbits: No acute finding. Other: None. CT CERVICAL SPINE FINDINGS Alignment: 2 mm anterolisthesis of C4 on C5 is degenerative. No acute subluxation noted. Skull base and vertebrae: No acute fracture. No primary bone lesion or focal pathologic process. Soft tissues and spinal canal: No prevertebral fluid or swelling. No visible canal hematoma. Disc levels:  Severe degenerative disc disease from C4-C7 noted. Upper chest: Negative. Other: None IMPRESSION: No evidence of acute intracranial abnormality. Atrophy and chronic small-vessel white matter ischemic changes. No static evidence of acute injury to the cervical spine.  Severe degenerative disc disease from C4-C7. Electronically Signed   By: Margarette Canada M.D.   On: 03/16/2017 23:18   Ct Cervical Spine Wo Contrast  Result Date: 03/16/2017 CLINICAL DATA:  83 year old male with under witnessed fall at home. Altered mental status. EXAM: CT HEAD WITHOUT CONTRAST CT CERVICAL SPINE WITHOUT CONTRAST TECHNIQUE: Multidetector CT imaging of the head and cervical spine was performed following the standard protocol without intravenous contrast. Multiplanar CT image reconstructions of the cervical spine were also generated. COMPARISON:  01/17/2017 and prior head CTs FINDINGS: CT HEAD FINDINGS Brain: No evidence of acute infarction, hemorrhage, hydrocephalus, extra-axial collection or mass lesion/mass effect. Atrophy and chronic small-vessel white matter ischemic changes noted. Vascular: Intracranial atherosclerotic calcifications noted. Skull: Normal. Negative for fracture or focal lesion. Sinuses/Orbits: No acute finding. Other: None. CT CERVICAL SPINE FINDINGS Alignment: 2 mm anterolisthesis of C4 on C5 is degenerative. No acute subluxation noted. Skull base and vertebrae: No acute fracture. No primary bone lesion or focal pathologic process. Soft tissues and spinal canal: No prevertebral fluid  or swelling. No visible canal hematoma. Disc levels:  Severe degenerative disc disease from C4-C7 noted. Upper chest: Negative. Other: None IMPRESSION: No evidence of acute intracranial abnormality. Atrophy and chronic small-vessel white matter ischemic changes. No static evidence of acute injury to the cervical spine. Severe degenerative disc disease from C4-C7. Electronically Signed   By: Margarette Canada M.D.   On: 03/16/2017 23:18   Dg Hip Unilat W Or W/o Pelvis Min 4 Views Left  Result Date: 03/16/2017 CLINICAL DATA:  Fall EXAM: DG HIP (WITH OR WITHOUT PELVIS) 4+V LEFT COMPARISON:  None. FINDINGS: There is a transverse fracture of the left femoral neck with approximately 2 cm of overriding. The  femoral head remains situated in the acetabulum. No pelvic diastasis. No acute abnormality of the right hip. IMPRESSION: Left femoral neck transverse fracture Electronically Signed   By: Ulyses Jarred M.D.   On: 03/16/2017 22:50    All pertinent xrays, MRI, CT independently reviewed and interpreted  Positive ROS: All other systems have been reviewed and were otherwise negative with the exception of those mentioned in the HPI and as above.  Physical Exam: General: no acute distress Cardiovascular: No pedal edema Respiratory: No cyanosis, no use of accessory musculature GI: No organomegaly, abdomen is soft and non-tender Skin: No lesions in the area of chief complaint Neurologic: Sensation intact distally Psychiatric: Patient has advanced dementia Lymphatic: No axillary or cervical lymphadenopathy  MUSCULOSKELETAL:  - pain with movement of the hip and extremity - skin intact - NVI distally - compartments soft  Assessment: Left femoral neck hip fracture  Plan: - discussed the r/b/a to partial hip replacement with family at bedside - they understand 20-30% risk of 1 year mortality - patient is at mod-high risk for surgery - family wishes to proceed with surgery - NPO after midnight - surgery scheduled for 7:30 am tomrorow   Thank you for the consult and the opportunity to see Mr. Princess Perna Eduard Roux, MD [place] 10:38 PM

## 2017-03-17 NOTE — ED Notes (Signed)
Explained delay in bed assignment to family, who verbalized understanding. Pt resting comfortably in bed at this time, no complaints.

## 2017-03-17 NOTE — Consult Note (Signed)
[facility] Renal Consultation Note    Indication for Consultation:  Management of ESRD/hemodialysis; anemia, hypertension/volume and secondary hyperparathyroidism PCP: Dr. Shon Baton  HPI: Steven Forbes is a 83 y.o. male with ESRD on hemodialysis MWF at [facility]. PMH significant for DMT2, Aortic stenosis, advanced dementia, amputation of toes R foot. Patient presented to ED 03/16/17 after he sustained mechanical fall at home. He complained of L hip pain on arrival, was unable to bear wt post fall. Xray of L hip showed Left femoral neck transverse fracture. Orthopedics has been consulted but patient has multiple co-morbid conditions which increase risk of surgery. Patient's family to have meeting to discuss whether they wish to pursue aggressive management or non-operative palliative management.   Patient is currently sleeping, slightly difficult to arouse but smiles at me and nods then falls back to sleep. He is unable to contribute to H & P. Wife is not present-2 daughters with pt at present. Patient noted by primary to have loud systolic M. 2 D echo has been ordered.    Past Medical History:  Diagnosis Date  . Anemia   . Asthma   . Dementia   . Diabetes mellitus    type 2  . ESRD (end stage renal disease) ([place])    Dialysis on MONDAY, [place] and FRIDAYS  . Flu 08/2014  . GERD (gastroesophageal reflux disease)   . History of blood transfusion   . HOH (hard of hearing)   . Hx of echocardiogram    a.  Echocardiogram (09/10/2013): EF 50-55%, normal wall motion, grade 2 diastolic dysfunction, mild aortic stenosis, trivial AI, MAC, moderate MR, mild LAE, moderate to severe TR, moderately increased PASP.  [name] Hx of non-ST elevation myocardial infarction (NSTEMI)    a. in setting of influenza, volume overload 08/2013 => Lexiscan Myoview (09/11/2013): Subtle ischemia in the anteroseptal wall, EF 44%. => med Rx recommended  . Hyperlipidemia   . Hypertension   . Shortness of breath     with exertion   Past Surgical History:  Procedure Laterality Date  . AV FISTULA PLACEMENT  2008   Left Upper Arm  . COLONOSCOPY    . EYE SURGERY Bilateral    cataract surgery  . LAPAROSCOPIC GASTROTOMY W/ REPAIR OF ULCER    . REVISON OF ARTERIOVENOUS FISTULA Left 11/21/2013   Procedure: REVISION OF ARTERIOVENOUS FISTULA;  Surgeon: Angelia Mould, MD;  Location: [place];  Service: Vascular;  Laterality: Left;  . REVISON OF ARTERIOVENOUS FISTULA Left 02/18/2016   Procedure: excision of ulcerated skin over left brachio-cephalic AV fistula x 2;  Surgeon: [name] Misty, MD;  Location: [place];  Service: Vascular;  Laterality: Left;  . TOE AMPUTATION Right    all toes   Family History  Problem Relation Age of Onset  . Cancer Mother   . Hypertension Mother   . Cancer Father   . Diabetes Father   . Hypertension Father   . Diabetes Son   . Diabetes Daughter   . Diabetes Daughter   . Diabetes Cousin    Social History:  reports that he has never smoked. He has never used smokeless tobacco. He reports that he does not drink alcohol or use drugs. No Known Allergies Prior to Admission medications   Medication Sig Start Date End Date Taking? Authorizing Provider  atorvastatin (LIPITOR) 80 MG tablet Take 80 mg by mouth daily.  09/23/13  Yes [provider]  calcium acetate (PHOSLO) 667 MG capsule Take 667 mg by mouth  3 (three) times daily with meals.  07/10/16  Yes [provider]  [facility] ASPIRIN LOW DOSE 81 MG EC tablet TAKE 1 TABLET (81 MG TOTAL) BY MOUTH DAILY. NEED OV. 11/28/16  Yes Lelon Perla, MD  darbepoetin (ARANESP) 100 MCG/0.5ML SOLN injection Inject 0.5 mLs (100 mcg total) into the vein every Monday with hemodialysis. 09/13/13  Yes Charlynne Cousins, MD  labetalol (NORMODYNE) 300 MG tablet Take 300 mg by mouth as needed (for high blood pressure).    Yes [provider]  memantine (NAMENDA) 5 MG tablet Take 5 mg by mouth daily.  12/23/14  Yes [provider]  omeprazole (PRILOSEC) 20 MG capsule Take 1 capsule (20 mg total) by mouth daily. 01/19/17  Yes Reyne Dumas, MD  risperiDONE (RISPERDAL) 0.5 MG tablet Take 1 mg by mouth at bedtime.  01/13/15  Yes [provider]  PROAIR HFA 108 (90 Base) MCG/ACT inhaler Inhale 2 puffs into the lungs 3 (three) times daily.  12/14/15   [provider]   Current Facility-Administered Medications  Medication Dose Route Frequency Provider Last Rate Last Dose  . albuterol (PROVENTIL) (2.5 MG/3ML) 0.083% nebulizer solution 3 mL  3 mL Inhalation TID Etta Quill, DO      . aspirin EC tablet 325 mg  325 mg Oral Daily Alcario Drought, Jared M, DO      . atorvastatin (LIPITOR) tablet 80 mg  80 mg Oral Daily Jennette Kettle M, DO      . calcium acetate (PHOSLO) capsule 667 mg  667 mg Oral TID WC Etta Quill, DO      . HYDROcodone-acetaminophen (NORCO/VICODIN) 5-325 MG per tablet 1-2 tablet  1-2 tablet Oral Q6H PRN Etta Quill, DO      . labetalol (NORMODYNE,TRANDATE) injection 5 mg  5 mg Intravenous Q2H PRN Etta Quill, DO      . memantine [facility]) tablet 5 mg  5 mg Oral Daily Jennette Kettle M, DO      . morphine 4 MG/ML injection 0.52 mg  0.52 mg Intravenous Q2H PRN Etta Quill, DO   0.52 mg at 03/17/17 1131  . pantoprazole (PROTONIX) EC tablet 40 mg  40 mg Oral Daily Alcario Drought, Jared M, DO      . polyethylene glycol (MIRALAX / GLYCOLAX) packet 17 g  17 g Oral Daily PRN Etta Quill, DO       Labs: Basic Metabolic Panel:  Recent Labs Lab 03/16/17 2317  NA 136  K 3.4*  CL 100*  CO2 26  GLUCOSE 128*  BUN 11  CREATININE 3.70*  CALCIUM 8.2*   Liver Function Tests: No results for input(s): AST, ALT, ALKPHOS, BILITOT, PROT, ALBUMIN in the last 168 hours. No results for input(s): LIPASE, AMYLASE in the last 168 hours. No results for input(s): AMMONIA in the last 168 hours. CBC:  Recent Labs Lab 03/16/17 2317  WBC 14.1*  NEUTROABS 12.0*  HGB 11.1*  HCT 34.7*   MCV 88.1  PLT 141*   Cardiac Enzymes: No results for input(s): CKTOTAL, CKMB, CKMBINDEX, TROPONINI in the last 168 hours. CBG:  Recent Labs Lab 03/17/17 0401  GLUCAP 107*   Iron Studies: No results for input(s): IRON, TIBC, TRANSFERRIN, FERRITIN in the last 72 hours. Studies/Results: Dg Chest 1 View  Result Date: 03/16/2017 CLINICAL DATA:  Decreased mobility after an unwitnessed fall at home. EXAM: CHEST 1 VIEW COMPARISON:  01/17/2017 FINDINGS: Right jugular dual-lumen catheter extends into the right atrium. Mild cardiomegaly, unchanged.  The lungs are clear. The pulmonary vasculature is normal. No pleural effusion. IMPRESSION: Stable cardiomegaly.  No acute findings. Electronically Signed   By: Andreas Newport M.D.   On: 03/16/2017 22:50   Ct Head Wo Contrast  Result Date: 03/16/2017 CLINICAL DATA:  83 year old male with under witnessed fall at home. Altered mental status. EXAM: CT HEAD WITHOUT CONTRAST CT CERVICAL SPINE WITHOUT CONTRAST TECHNIQUE: Multidetector CT imaging of the head and cervical spine was performed following the standard protocol without intravenous contrast. Multiplanar CT image reconstructions of the cervical spine were also generated. COMPARISON:  01/17/2017 and prior head CTs FINDINGS: CT HEAD FINDINGS Brain: No evidence of acute infarction, hemorrhage, hydrocephalus, extra-axial collection or mass lesion/mass effect. Atrophy and chronic small-vessel white matter ischemic changes noted. Vascular: Intracranial atherosclerotic calcifications noted. Skull: Normal. Negative for fracture or focal lesion. Sinuses/Orbits: No acute finding. Other: None. CT CERVICAL SPINE FINDINGS Alignment: 2 mm anterolisthesis of C4 on C5 is degenerative. No acute subluxation noted. Skull base and vertebrae: No acute fracture. No primary bone lesion or focal pathologic process. Soft tissues and spinal canal: No prevertebral fluid or swelling. No visible canal hematoma. Disc levels:  Severe  degenerative disc disease from C4-C7 noted. Upper chest: Negative. Other: None IMPRESSION: No evidence of acute intracranial abnormality. Atrophy and chronic small-vessel white matter ischemic changes. No static evidence of acute injury to the cervical spine. Severe degenerative disc disease from C4-C7. Electronically Signed   By: Margarette Canada M.D.   On: 03/16/2017 23:18   Ct Cervical Spine Wo Contrast  Result Date: 03/16/2017 CLINICAL DATA:  83 year old male with under witnessed fall at home. Altered mental status. EXAM: CT HEAD WITHOUT CONTRAST CT CERVICAL SPINE WITHOUT CONTRAST TECHNIQUE: Multidetector CT imaging of the head and cervical spine was performed following the standard protocol without intravenous contrast. Multiplanar CT image reconstructions of the cervical spine were also generated. COMPARISON:  01/17/2017 and prior head CTs FINDINGS: CT HEAD FINDINGS Brain: No evidence of acute infarction, hemorrhage, hydrocephalus, extra-axial collection or mass lesion/mass effect. Atrophy and chronic small-vessel white matter ischemic changes noted. Vascular: Intracranial atherosclerotic calcifications noted. Skull: Normal. Negative for fracture or focal lesion. Sinuses/Orbits: No acute finding. Other: None. CT CERVICAL SPINE FINDINGS Alignment: 2 mm anterolisthesis of C4 on C5 is degenerative. No acute subluxation noted. Skull base and vertebrae: No acute fracture. No primary bone lesion or focal pathologic process. Soft tissues and spinal canal: No prevertebral fluid or swelling. No visible canal hematoma. Disc levels:  Severe degenerative disc disease from C4-C7 noted. Upper chest: Negative. Other: None IMPRESSION: No evidence of acute intracranial abnormality. Atrophy and chronic small-vessel white matter ischemic changes. No static evidence of acute injury to the cervical spine. Severe degenerative disc disease from C4-C7. Electronically Signed   By: Margarette Canada M.D.   On: 03/16/2017 23:18   Dg Hip  Unilat W Or W/o Pelvis Min 4 Views Left  Result Date: 03/16/2017 CLINICAL DATA:  Fall EXAM: DG HIP (WITH OR WITHOUT PELVIS) 4+V LEFT COMPARISON:  None. FINDINGS: There is a transverse fracture of the left femoral neck with approximately 2 cm of overriding. The femoral head remains situated in the acetabulum. No pelvic diastasis. No acute abnormality of the right hip. IMPRESSION: Left femoral neck transverse fracture Electronically Signed   By: Ulyses Jarred M.D.   On: 03/16/2017 22:50    ROS: As per HPI otherwise negative.   Physical Exam: Vitals:   03/17/17 0400 03/17/17 0445 03/17/17 0500 03/17/17 0600  BP: (!) 148/57  [name])  96/46 (!) 113/44  Pulse: 72  73 73  Resp: 10  15 17   Temp:  97.8 F (36.6 C)    TempSrc:  Oral    SpO2: 99%  97% 98%  Weight:      Height:         General: Elderly patient, somewhat lethargic, NAD Head: Normocephalic, atraumatic, sclera non-icteric, mucus membranes are moist Neck: Supple. JVD not elevated. Lungs: Clear bilaterally to auscultation without wheezes, rales, or rhonchi. Breathing is unlabored. Heart: RRR with S1 S2. 3/6 Systolic M.  Abdomen: Soft, non-tender, non-distended with normoactive bowel sounds. No rebound/guarding. No obvious abdominal masses. M-S:  Strength and tone appear normal for age. Lower extremities: slight external rotation of LLE. Amputated toes R foot. Trace LE edema.  Neuro: a Moves all extremities spontaneously. Psych:  Responds to questions appropriately with a normal affect. Dialysis Access:  Dialysis Orders: [facility] MWF 4 hrs 180 NRe 400/Auto 1.5 56 kg 3.0 K/ 2.25 Ca UF profile 4 RIJ TDC placed 01/16/17 per Dr. Augustin Coupe -Heparin 5000 units IV TIW -Hectorol 2 mcg IV TIW -Mircera 75 mcg IV Q 2 weeks (Last dose 03/15/17 Last HGB 10.4 03/15/17)   Assessment/Plan: 1.  L displaced Femoral Neck Fx: per primary. Ortho consulted. Family meeting to decide surgical vs palliative approach.  2.  ESRD - MWF HD due today-holding off  awaiting decision as to whether pt is going to surgery. K+ 3.4 3.  Hypertension/volume  - CXR unremarkable. No evidence of volume overload on exam. BP slightly on soft side-may be due to pain meds. Wt 54.4 kg which is under OP EDW. Run even with HD.  4.  Anemia  - HGB 11.1 today. ESA dose given 03/15/17. Follow HGB 5.  Metabolic bone disease - cont binder/VDRA 6.  Nutrition - renal diet, renal vit/nepro 7.  DM: per primary  Rita H. Owens Shark, NP-C 03/17/2017, 12:19 PM  [facility] Bee[facility] [phone]  Pt seen, examined and agree w A/P as above. ESRD pt with significant dementia and fall with hip fracture.  Ortho consulted. I spoke w/ family and encouraged a conservative approach as a viable and reasonable option given pt's multiple and significant comorbidities.  Will follow.  Kelly Splinter MD [employer] pager [phone]   03/17/2017, 12:57 PM

## 2017-03-17 NOTE — Progress Notes (Signed)
Called pt's wife to inform her that his surgery will be tomorrow at 0730 and that Dr. Erlinda Hong will be performing the surgery.  Also informed daughter who is at bedside.

## 2017-03-17 NOTE — Progress Notes (Signed)
[facility] PROGRESS NOTE  Agamjot Kilgallon LXB:[id number] DOB: [birth year] DOA: 03/16/2017  PCP: Shon Baton, MD  Brief History/Interval Summary: 83 year old African-American male with a past medical history of end-stage renal disease on hemodialysis on Monday, Wednesday, Friday, advanced dementia, history of transmetatarsal amputation of the right foot, presented to the emergency department after he was unable to bear weight on the left leg after having a fall at home. Found to have a left hip fracture. Hospitalized for further management.  Reason for Visit: Left hip fracture  Consultants: Orthopedics. Nephrology.  Procedures:  Transthoracic echocardiogram Study Conclusions  - Left ventricle: The cavity size was normal. Systolic function was   normal. The estimated ejection fraction was in the range of 50%   to 55%. There is hypokinesis of the basal inferior and   inferolateral walls. - Aortic valve: Valve mobility was restricted. There was moderate   stenosis. There was moderate regurgitation. Mean gradient (S): 18   mm Hg. Peak gradient (S): 35 mm Hg. Valve area (VTI): 1.19 cm^2.   Valve area (Vmax): 1.11 cm^2. Valve area (Vmean): 0.99 cm^2. - Aortic root: The aortic root was normal in size. - Mitral valve: Calcified annulus. Mildly thickened leaflets .   There was moderate to severe regurgitation. - Right ventricle: Systolic function was normal. - Tricuspid valve: There was mild regurgitation. - Pulmonary arteries: Systolic pressure was mildly increased. PA   peak pressure: 32 mm Hg (S). - Line: A venous catheter was visualized in the superior vena cava,   with its tip in the right atrium. No abnormal features noted.   Antibiotics: None  Subjective/Interval History: Patient open his eyes but does not really communicate much. Patient does have advanced dementia, so this could be his baseline.  ROS: Unable to do  Objective:  Vital Signs  Vitals:   03/17/17  0400 03/17/17 0445 03/17/17 0500 03/17/17 0600  BP: (!) 148/57  (!) 96/46 (!) 113/44  Pulse: 72  73 73  Resp: 10  15 17   Temp:  97.8 F (36.6 C)    TempSrc:  Oral    SpO2: 99%  97% 98%  Weight:      Height:       No intake or output data in the 24 hours ending 03/17/17 1127 Filed Weights   03/16/17 2136  Weight: 54.4 kg (120 lb)    General appearance: Somnolent but arousable. Does not appear to be in any distress Resp: clear to auscultation bilaterally Cardio: S1, S2 is normal, regular. Systolic murmur appreciated over the aortic area. GI: soft, non-tender; bowel sounds normal; no masses,  no organomegaly Extremities: Extremities not manipulated due to fracture Neurologic: No focal deficits. He has dementia.  Lab Results:  Data Reviewed: I have personally reviewed following labs and imaging studies  CBC:  Recent Labs Lab 03/16/17 2317  WBC 14.1*  NEUTROABS 12.0*  HGB 11.1*  HCT 34.7*  MCV 88.1  PLT 141*    Basic Metabolic Panel:  Recent Labs Lab 03/16/17 2317  NA 136  K 3.4*  CL 100*  CO2 26  GLUCOSE 128*  BUN 11  CREATININE 3.70*  CALCIUM 8.2*    GFR: Estimated Creatinine Clearance: 10.6 mL/min (A) (by C-G formula based on SCr of 3.7 mg/dL (H)).  CBG:  Recent Labs Lab 03/17/17 0401  GLUCAP 107*    Radiology Studies: Dg Chest 1 View  Result Date: 03/16/2017 CLINICAL DATA:  Decreased mobility after an unwitnessed fall at home. EXAM: CHEST 1 VIEW COMPARISON:  01/17/2017 FINDINGS: Right jugular dual-lumen catheter extends into the right atrium. Mild cardiomegaly, unchanged. The lungs are clear. The pulmonary vasculature is normal. No pleural effusion. IMPRESSION: Stable cardiomegaly.  No acute findings. Electronically Signed   By: Andreas Newport M.D.   On: 03/16/2017 22:50   Ct Head Wo Contrast  Result Date: 03/16/2017 CLINICAL DATA:  83 year old male with under witnessed fall at home. Altered mental status. EXAM: CT HEAD WITHOUT CONTRAST CT  CERVICAL SPINE WITHOUT CONTRAST TECHNIQUE: Multidetector CT imaging of the head and cervical spine was performed following the standard protocol without intravenous contrast. Multiplanar CT image reconstructions of the cervical spine were also generated. COMPARISON:  01/17/2017 and prior head CTs FINDINGS: CT HEAD FINDINGS Brain: No evidence of acute infarction, hemorrhage, hydrocephalus, extra-axial collection or mass lesion/mass effect. Atrophy and chronic small-vessel white matter ischemic changes noted. Vascular: Intracranial atherosclerotic calcifications noted. Skull: Normal. Negative for fracture or focal lesion. Sinuses/Orbits: No acute finding. Other: None. CT CERVICAL SPINE FINDINGS Alignment: 2 mm anterolisthesis of C4 on C5 is degenerative. No acute subluxation noted. Skull base and vertebrae: No acute fracture. No primary bone lesion or focal pathologic process. Soft tissues and spinal canal: No prevertebral fluid or swelling. No visible canal hematoma. Disc levels:  Severe degenerative disc disease from C4-C7 noted. Upper chest: Negative. Other: None IMPRESSION: No evidence of acute intracranial abnormality. Atrophy and chronic small-vessel white matter ischemic changes. No static evidence of acute injury to the cervical spine. Severe degenerative disc disease from C4-C7. Electronically Signed   By: Margarette Canada M.D.   On: 03/16/2017 23:18   Ct Cervical Spine Wo Contrast  Result Date: 03/16/2017 CLINICAL DATA:  83 year old male with under witnessed fall at home. Altered mental status. EXAM: CT HEAD WITHOUT CONTRAST CT CERVICAL SPINE WITHOUT CONTRAST TECHNIQUE: Multidetector CT imaging of the head and cervical spine was performed following the standard protocol without intravenous contrast. Multiplanar CT image reconstructions of the cervical spine were also generated. COMPARISON:  01/17/2017 and prior head CTs FINDINGS: CT HEAD FINDINGS Brain: No evidence of acute infarction, hemorrhage,  hydrocephalus, extra-axial collection or mass lesion/mass effect. Atrophy and chronic small-vessel white matter ischemic changes noted. Vascular: Intracranial atherosclerotic calcifications noted. Skull: Normal. Negative for fracture or focal lesion. Sinuses/Orbits: No acute finding. Other: None. CT CERVICAL SPINE FINDINGS Alignment: 2 mm anterolisthesis of C4 on C5 is degenerative. No acute subluxation noted. Skull base and vertebrae: No acute fracture. No primary bone lesion or focal pathologic process. Soft tissues and spinal canal: No prevertebral fluid or swelling. No visible canal hematoma. Disc levels:  Severe degenerative disc disease from C4-C7 noted. Upper chest: Negative. Other: None IMPRESSION: No evidence of acute intracranial abnormality. Atrophy and chronic small-vessel white matter ischemic changes. No static evidence of acute injury to the cervical spine. Severe degenerative disc disease from C4-C7. Electronically Signed   By: Margarette Canada M.D.   On: 03/16/2017 23:18   Dg Hip Unilat W Or W/o Pelvis Min 4 Views Left  Result Date: 03/16/2017 CLINICAL DATA:  Fall EXAM: DG HIP (WITH OR WITHOUT PELVIS) 4+V LEFT COMPARISON:  None. FINDINGS: There is a transverse fracture of the left femoral neck with approximately 2 cm of overriding. The femoral head remains situated in the acetabulum. No pelvic diastasis. No acute abnormality of the right hip. IMPRESSION: Left femoral neck transverse fracture Electronically Signed   By: Ulyses Jarred M.D.   On: 03/16/2017 22:50     Medications:  Scheduled: . albuterol  3 mL Inhalation TID  .  aspirin EC  325 mg Oral Daily  . atorvastatin  80 mg Oral Daily  . calcium acetate  667 mg Oral TID WC  . memantine  5 mg Oral Daily  . pantoprazole  40 mg Oral Daily  . white petrolatum       Continuous:  OEH:[id number], labetalol, morphine injection, polyethylene glycol  Assessment/Plan:  Principal Problem:   Left displaced femoral neck  fracture (HCC) Active Problems:   ESRD on dialysis [facility])   Essential hypertension   Moderate dementia   Diabetes with neurologic complications (HCC)    Left hip fracture. Thought to be secondary to mechanical fall. Orthopedics has been consulted. Continue nothing by mouth status for possible surgery. Nephrology notified, as today is his dialysis day. Patient's EKG, chest x-ray reviewed. EKG was read by computer as atrial fibrillation, however P waves are noted. Could be sinus arrhythmia. Right bundle branch block is old. Patient was found to have a loud murmur on examination. Previous echocardiograms showed mild aortic stenosis. So an echocardiogram was ordered to be done urgently. It has been done. Shows normal systolic function. Some wall motion abnormality noted. Moderate aortic stenosis is present. Patient does have advanced dementia and so unable to elicit symptomatology from him. Previous cardiology notes reviewed. Patient used to be followed by cardiology. He had a nuclear stress test in 2014, which showed some reversibility. However, in view of patient's advanced dementia he is not really a candidate for aggressive cardiac workup. He will be moderate to somewhat high risk for experiencing cardiac complications with surgery. However, at the same time he does not need any further testing. He may proceed to surgery. He will need to be monitored on telemetry after surgery. Patient is at risk for delirium postoperatively.  Moderate aortic stenosis with likely underlying coronary artery disease. See above. Not a candidate for aggressive evaluation and interventions.  End-stage renal disease on hemodialysis. Nephrology has been consulted. He is dialyzed on Monday, Wednesday, Friday. He has a dialysis catheter in his right chest.  Advanced dementia. He used to be followed by neurology. Was thought to have moderate dementia about 2 years ago, but apparently this has gotten worse. During his last  hospitalization a few months ago he was not responding much either, not answering questions. So, this has progressed. Was on Namenda and Risperdal at home.  History of Essential hypertension. Monitor blood pressures closely. Apparently not on any home medications for same.  Diabetes mellitus type 2. Apparently not on home insulin. Monitor CBGs. SSI.  DVT Prophylaxis: To be determined postoperatively    Code Status: Full code. This will need to be discussed again with family. Family Communication: No family at bedside  Disposition Plan: Management as outlined above.    LOS: 0 days   [facility] Pager [phone] 03/17/2017, 11:27 AM  If 7PM-7AM, please contact night-coverage at [email or web address], password [facility]

## 2017-03-17 NOTE — Progress Notes (Signed)
  Echocardiogram 2D Echocardiogram has been performed.  Steven Forbes 03/17/2017, 9:01 AM

## 2017-03-17 NOTE — ED Notes (Signed)
Attempted report 

## 2017-03-17 NOTE — H&P (Signed)
History and Physical    Steven Forbes JOA:[id number] DOB: 09-16-1926 DOA: 03/16/2017  PCP: Shon Baton, MD  Patient coming from: Home  I have personally briefly reviewed patient's old medical records in [place]  Chief Complaint: Unable to bear weight on L hip post fall  HPI: Steven Forbes is a 83 y.o. male with medical history significant of ESRD dialysis MWF, advanced dementia, recent transmetatarsal amputation of R foot.  Patient had fall at home today.  Has been unable to bear weight on L hip since fall.  Pain better at rest, worse with movement.  Lives at home with wife at baseline.  ED Course: L hip femoral neck fx.   Review of Systems: As per HPI otherwise 10 point review of systems negative.   Past Medical History:  Diagnosis Date  . Anemia   . Asthma   . Dementia   . Diabetes mellitus    type 2  . ESRD (end stage renal disease) ([place])    Dialysis on MONDAY, [place] and FRIDAYS  . Flu 08/2014  . GERD (gastroesophageal reflux disease)   . History of blood transfusion   . HOH (hard of hearing)   . Hx of echocardiogram    a.  Echocardiogram (09/10/2013): EF 50-55%, normal wall motion, grade 2 diastolic dysfunction, mild aortic stenosis, trivial AI, MAC, moderate MR, mild LAE, moderate to severe TR, moderately increased PASP.  [name] Hx of non-ST elevation myocardial infarction (NSTEMI)    a. in setting of influenza, volume overload 08/2013 => Lexiscan Myoview (09/11/2013): Subtle ischemia in the anteroseptal wall, EF 44%. => med Rx recommended  . Hyperlipidemia   . Hypertension   . Shortness of breath    with exertion    Past Surgical History:  Procedure Laterality Date  . AV FISTULA PLACEMENT  2008   Left Upper Arm  . COLONOSCOPY    . EYE SURGERY Bilateral    cataract surgery  . LAPAROSCOPIC GASTROTOMY W/ REPAIR OF ULCER    . REVISON OF ARTERIOVENOUS FISTULA Left 11/21/2013   Procedure: REVISION OF ARTERIOVENOUS FISTULA;  Surgeon: Angelia Mould,  MD;  Location: [facility];  Service: Vascular;  Laterality: Left;  . REVISON OF ARTERIOVENOUS FISTULA Left 02/18/2016   Procedure: excision of ulcerated skin over left brachio-cephalic AV fistula x 2;  Surgeon: [name] Misty, MD;  Location: [facility];  Service: Vascular;  Laterality: Left;  . TOE AMPUTATION Right    all toes     reports that he has never smoked. He has never used smokeless tobacco. He reports that he does not drink alcohol or use drugs.  No Known Allergies  Family History  Problem Relation Age of Onset  . Cancer Mother   . Hypertension Mother   . Cancer Father   . Diabetes Father   . Hypertension Father   . Diabetes Son   . Diabetes Daughter   . Diabetes Daughter   . Diabetes Cousin      Prior to Admission medications   Medication Sig Start Date End Date Taking? Authorizing Provider  atorvastatin (LIPITOR) 80 MG tablet Take 80 mg by mouth daily.  09/23/13  Yes [provider]  calcium acetate (PHOSLO) 667 MG capsule Take 667 mg by mouth 3 (three) times daily with meals.  07/10/16  Yes [provider]  [facility] ASPIRIN LOW DOSE 81 MG EC tablet TAKE 1 TABLET (81 MG TOTAL) BY MOUTH DAILY. NEED OV. 11/28/16  Yes Crenshaw, Denice Bors, MD  darbepoetin Kyra Searles)  100 MCG/0.5ML SOLN injection Inject 0.5 mLs (100 mcg total) into the vein every Monday with hemodialysis. 09/13/13  Yes Charlynne Cousins, MD  labetalol (NORMODYNE) 300 MG tablet Take 300 mg by mouth as needed (for high blood pressure).    Yes [provider]  memantine (NAMENDA) 5 MG tablet Take 5 mg by mouth daily.  12/23/14  Yes [provider]  omeprazole (PRILOSEC) 20 MG capsule Take 1 capsule (20 mg total) by mouth daily. 01/19/17  Yes Reyne Dumas, MD  risperiDONE (RISPERDAL) 0.5 MG tablet Take 1 mg by mouth at bedtime.  01/13/15  Yes [provider]  PROAIR HFA 108 (90 Base) MCG/ACT inhaler Inhale 2 puffs into the lungs 3 (three) times daily.  12/14/15   [provider]     Physical Exam: Vitals:   03/17/17 0030 03/17/17 0045 03/17/17 0100 03/17/17 0115  BP: (!) 165/68 (!) 162/92  (!) 158/70  Pulse: 72 65 81 78  Resp:   11 10  SpO2: 100% 99% 96% 100%  Weight:      Height:        Constitutional: NAD, calm, comfortable Eyes: PERRL, lids and conjunctivae normal ENMT: Mucous membranes are moist. Posterior pharynx clear of any exudate or lesions.Normal dentition.  Neck: normal, supple, no masses, no thyromegaly Respiratory: clear to auscultation bilaterally, no wheezing, no crackles. Normal respiratory effort. No accessory muscle use.  Cardiovascular: Regular rate and rhythm, no murmurs / rubs / gallops. No extremity edema. 2+ pedal pulses. No carotid bruits.  Abdomen: no tenderness, no masses palpated. No hepatosplenomegaly. Bowel sounds positive.  Musculoskeletal: RLE s/p transmetatarsal amputation, L hip TTP, LLE shortened and externally rotated. Skin: no rashes, lesions, ulcers. No induration Neurologic: CN 2-12 grossly intact. Sensation intact, DTR normal. Strength 5/5 in all 4.  Psychiatric: Demented, sedated due to fentanyl, but denying pain right now.   Labs on Admission: I have personally reviewed following labs and imaging studies  CBC:  Recent Labs Lab 03/16/17 2317  WBC 14.1*  NEUTROABS 12.0*  HGB 11.1*  HCT 34.7*  MCV 88.1  PLT 638*   Basic Metabolic Panel:  Recent Labs Lab 03/16/17 2317  NA 136  K 3.4*  CL 100*  CO2 26  GLUCOSE 128*  BUN 11  CREATININE 3.70*  CALCIUM 8.2*   GFR: Estimated Creatinine Clearance: 10.6 mL/min (A) (by C-G formula based on SCr of 3.7 mg/dL (H)). Liver Function Tests: No results for input(s): AST, ALT, ALKPHOS, BILITOT, PROT, ALBUMIN in the last 168 hours. No results for input(s): LIPASE, AMYLASE in the last 168 hours. No results for input(s): AMMONIA in the last 168 hours. Coagulation Profile: No results for input(s): INR, PROTIME in the last 168 hours. Cardiac Enzymes: No results  for input(s): CKTOTAL, CKMB, CKMBINDEX, TROPONINI in the last 168 hours. BNP (last 3 results) No results for input(s): PROBNP in the last 8760 hours. HbA1C: No results for input(s): HGBA1C in the last 72 hours. CBG: No results for input(s): GLUCAP in the last 168 hours. Lipid Profile: No results for input(s): CHOL, HDL, LDLCALC, TRIG, CHOLHDL, LDLDIRECT in the last 72 hours. Thyroid Function Tests: No results for input(s): TSH, T4TOTAL, FREET4, T3FREE, THYROIDAB in the last 72 hours. Anemia Panel: No results for input(s): VITAMINB12, FOLATE, FERRITIN, TIBC, IRON, RETICCTPCT in the last 72 hours. Urine analysis: No results found for: COLORURINE, APPEARANCEUR, LABSPEC, PHURINE, GLUCOSEU, HGBUR, BILIRUBINUR, KETONESUR, PROTEINUR, UROBILINOGEN, NITRITE, LEUKOCYTESUR  Radiological Exams on Admission: Dg Chest 1 View  Result Date: 03/16/2017  CLINICAL DATA:  Decreased mobility after an unwitnessed fall at home. EXAM: CHEST 1 VIEW COMPARISON:  01/17/2017 FINDINGS: Right jugular dual-lumen catheter extends into the right atrium. Mild cardiomegaly, unchanged. The lungs are clear. The pulmonary vasculature is normal. No pleural effusion. IMPRESSION: Stable cardiomegaly.  No acute findings. Electronically Signed   By: Andreas Newport M.D.   On: 03/16/2017 22:50   Ct Head Wo Contrast  Result Date: 03/16/2017 CLINICAL DATA:  83 year old male with under witnessed fall at home. Altered mental status. EXAM: CT HEAD WITHOUT CONTRAST CT CERVICAL SPINE WITHOUT CONTRAST TECHNIQUE: Multidetector CT imaging of the head and cervical spine was performed following the standard protocol without intravenous contrast. Multiplanar CT image reconstructions of the cervical spine were also generated. COMPARISON:  01/17/2017 and prior head CTs FINDINGS: CT HEAD FINDINGS Brain: No evidence of acute infarction, hemorrhage, hydrocephalus, extra-axial collection or mass lesion/mass effect. Atrophy and chronic small-vessel white  matter ischemic changes noted. Vascular: Intracranial atherosclerotic calcifications noted. Skull: Normal. Negative for fracture or focal lesion. Sinuses/Orbits: No acute finding. Other: None. CT CERVICAL SPINE FINDINGS Alignment: 2 mm anterolisthesis of C4 on C5 is degenerative. No acute subluxation noted. Skull base and vertebrae: No acute fracture. No primary bone lesion or focal pathologic process. Soft tissues and spinal canal: No prevertebral fluid or swelling. No visible canal hematoma. Disc levels:  Severe degenerative disc disease from C4-C7 noted. Upper chest: Negative. Other: None IMPRESSION: No evidence of acute intracranial abnormality. Atrophy and chronic small-vessel white matter ischemic changes. No static evidence of acute injury to the cervical spine. Severe degenerative disc disease from C4-C7. Electronically Signed   By: Margarette Canada M.D.   On: 03/16/2017 23:18   Ct Cervical Spine Wo Contrast  Result Date: 03/16/2017 CLINICAL DATA:  83 year old male with under witnessed fall at home. Altered mental status. EXAM: CT HEAD WITHOUT CONTRAST CT CERVICAL SPINE WITHOUT CONTRAST TECHNIQUE: Multidetector CT imaging of the head and cervical spine was performed following the standard protocol without intravenous contrast. Multiplanar CT image reconstructions of the cervical spine were also generated. COMPARISON:  01/17/2017 and prior head CTs FINDINGS: CT HEAD FINDINGS Brain: No evidence of acute infarction, hemorrhage, hydrocephalus, extra-axial collection or mass lesion/mass effect. Atrophy and chronic small-vessel white matter ischemic changes noted. Vascular: Intracranial atherosclerotic calcifications noted. Skull: Normal. Negative for fracture or focal lesion. Sinuses/Orbits: No acute finding. Other: None. CT CERVICAL SPINE FINDINGS Alignment: 2 mm anterolisthesis of C4 on C5 is degenerative. No acute subluxation noted. Skull base and vertebrae: No acute fracture. No primary bone lesion or focal  pathologic process. Soft tissues and spinal canal: No prevertebral fluid or swelling. No visible canal hematoma. Disc levels:  Severe degenerative disc disease from C4-C7 noted. Upper chest: Negative. Other: None IMPRESSION: No evidence of acute intracranial abnormality. Atrophy and chronic small-vessel white matter ischemic changes. No static evidence of acute injury to the cervical spine. Severe degenerative disc disease from C4-C7. Electronically Signed   By: Margarette Canada M.D.   On: 03/16/2017 23:18   Dg Hip Unilat W Or W/o Pelvis Min 4 Views Left  Result Date: 03/16/2017 CLINICAL DATA:  Fall EXAM: DG HIP (WITH OR WITHOUT PELVIS) 4+V LEFT COMPARISON:  None. FINDINGS: There is a transverse fracture of the left femoral neck with approximately 2 cm of overriding. The femoral head remains situated in the acetabulum. No pelvic diastasis. No acute abnormality of the right hip. IMPRESSION: Left femoral neck transverse fracture Electronically Signed   By: Cletus Gash.D.  On: 03/16/2017 22:50    EKG: Independently reviewed.  Assessment/Plan Principal Problem:   Left displaced femoral neck fracture (HCC) Active Problems:   ESRD on dialysis [facility])   Essential hypertension   Moderate dementia   Diabetes with neurologic complications (HCC)    1. L displaced femoral neck fx -  1. Ortho to see in AM 2. NPO for possible surgery (TBD) 3. Hip fx pathway 4. Pain control per pathway 2. ESRD - 1. Left message with nephrology for routine dialysis while inpatient, typically dialyzed MWF 3. HTN - currently on no home meds 1. Will add PRN labetalol 4. DM - currently on no home insulin, BGL tends to drop overnight when not eating per family 1. Q4H CBGs for now 2. Add SSI if needed 5. Dementia - Continue namenda  DVT prophylaxis: ASA / SCDs for now Code Status: Full code for now (did start to get family thinking about making him DNR in near future and they are also willing to talk to pal care given his  poor long term prognosis due to above diseases) Family Communication: Daughters at bedside Disposition Plan: TBD Consults called: Ortho - Dr. Marlou Sa will see in AM per EDP Admission status: Admit to inpatient   Etta Quill DO [facility] Pager [phone]  If 7AM-7PM, please contact day team taking care of patient [email or web address] Password [email or web address]  03/17/2017, 1:36 AM

## 2017-03-17 NOTE — Progress Notes (Signed)
Dialysis treatment completed.  500 mL ultrafiltrated.  0 mL net fluid removal.  Patient status unchanged. Lung sounds diminished to ausculation in all fields. No edema. Cardiac: NSR.  Cleansed RIJ catheter with chlorhexidine.  Disconnected lines and flushed ports with saline per protocol.  Ports locked with heparin and capped per protocol.    Report given to bedside, RN Stanton Kidney.

## 2017-03-17 NOTE — Consult Note (Signed)
ORTHOPAEDIC CONSULTATION  REQUESTING PHYSICIAN: Bonnielee Haff, MD  Chief Complaint: Left kip pain after a fall.  Assessment: Principal Problem:   Left displaced femoral neck fracture (HCC) Active Problems:   ESRD on dialysis [facility])   Essential hypertension   Moderate dementia   Diabetes with neurologic complications (HCC)  Left Femoral neck fracture: -Surgical option would be hemiarthroplasty, but patient has multiple medical comorbidies which would significantly increase the surgical risk.  The family would like to convene and with the patient consider his best option and goals.  They will discuss with Dr. Alain Marion later today.   He normally would have dialysis today (M/W/F).    HPI: Steven Forbes is a 84 y.o. male with multiple medical comorbidites  Including ESRD who complains of left hip pain after a fall.  Presented to ED where XR showed transverse fracture of the left femoral neck.  Orthopedics was consulted for evaluation.  His wife makes medical decisions for him due to dementia.   Daughters say that there are some in the family that would prefer non-operative / palliative management and others who would support surgical option.  History provided by 2 of his 5 daughters as he did not respond to questions - reportedly just had pain medication.  Previously ambulatory with shuffling gait.  He has a walker but does not use it.  Past Medical History:  Diagnosis Date  . Anemia   . Asthma   . Dementia   . Diabetes mellitus    type 2  . ESRD (end stage renal disease) ([place])    Dialysis on MONDAY, [place] and FRIDAYS  . Flu 08/2014  . GERD (gastroesophageal reflux disease)   . History of blood transfusion   . HOH (hard of hearing)   . Hx of echocardiogram    a.  Echocardiogram (09/10/2013): EF 50-55%, normal wall motion, grade 2 diastolic dysfunction, mild aortic stenosis, trivial AI, MAC, moderate MR, mild LAE, moderate to severe TR, moderately increased PASP.  [name]  Hx of non-ST elevation myocardial infarction (NSTEMI)    a. in setting of influenza, volume overload 08/2013 => Lexiscan Myoview (09/11/2013): Subtle ischemia in the anteroseptal wall, EF 44%. => med Rx recommended  . Hyperlipidemia   . Hypertension   . Shortness of breath    with exertion   Past Surgical History:  Procedure Laterality Date  . AV FISTULA PLACEMENT  2008   Left Upper Arm  . COLONOSCOPY    . EYE SURGERY Bilateral    cataract surgery  . LAPAROSCOPIC GASTROTOMY W/ REPAIR OF ULCER    . REVISON OF ARTERIOVENOUS FISTULA Left 11/21/2013   Procedure: REVISION OF ARTERIOVENOUS FISTULA;  Surgeon: Angelia Mould, MD;  Location: [facility];  Service: Vascular;  Laterality: Left;  . REVISON OF ARTERIOVENOUS FISTULA Left 02/18/2016   Procedure: excision of ulcerated skin over left brachio-cephalic AV fistula x 2;  Surgeon: [name] Misty, MD;  Location: [place] [facility];  Service: Vascular;  Laterality: Left;  . TOE AMPUTATION Right    all toes   Social History   Social History  . Marital status: Married    Spouse name: N/A  . Number of children: 7  . Years of education: N/A   Occupational History  . Retired    Social History Main Topics  . Smoking status: Never Smoker  . Smokeless tobacco: Never Used  . Alcohol use No  . Drug use: No  . Sexual activity: Not Asked   Other Topics  Concern  . None   Social History Narrative  . None   Family History  Problem Relation Age of Onset  . Cancer Mother   . Hypertension Mother   . Cancer Father   . Diabetes Father   . Hypertension Father   . Diabetes Son   . Diabetes Daughter   . Diabetes Daughter   . Diabetes Cousin    No Known Allergies Prior to Admission medications   Medication Sig Start Date End Date Taking? Authorizing Provider  atorvastatin (LIPITOR) 80 MG tablet Take 80 mg by mouth daily.  09/23/13  Yes [provider]  calcium acetate (PHOSLO) 667 MG capsule Take 667 mg by mouth 3 (three) times daily with  meals.  07/10/16  Yes [provider]  [facility]S ASPIRIN LOW DOSE 81 MG EC tablet TAKE 1 TABLET (81 MG TOTAL) BY MOUTH DAILY. NEED OV. 11/28/16  Yes Lelon Perla, MD  darbepoetin (ARANESP) 100 MCG/0.5ML SOLN injection Inject 0.5 mLs (100 mcg total) into the vein every Monday with hemodialysis. 09/13/13  Yes Charlynne Cousins, MD  labetalol (NORMODYNE) 300 MG tablet Take 300 mg by mouth as needed (for high blood pressure).    Yes [provider]  memantine (NAMENDA) 5 MG tablet Take 5 mg by mouth daily.  12/23/14  Yes [provider]  omeprazole (PRILOSEC) 20 MG capsule Take 1 capsule (20 mg total) by mouth daily. 01/19/17  Yes Reyne Dumas, MD  risperiDONE (RISPERDAL) 0.5 MG tablet Take 1 mg by mouth at bedtime.  01/13/15  Yes [provider]  PROAIR HFA 108 (90 Base) MCG/ACT inhaler Inhale 2 puffs into the lungs 3 (three) times daily.  12/14/15   [provider]   Dg Chest 1 View  Result Date: 03/16/2017 CLINICAL DATA:  Decreased mobility after an unwitnessed fall at home. EXAM: CHEST 1 VIEW COMPARISON:  01/17/2017 FINDINGS: Right jugular dual-lumen catheter extends into the right atrium. Mild cardiomegaly, unchanged. The lungs are clear. The pulmonary vasculature is normal. No pleural effusion. IMPRESSION: Stable cardiomegaly.  No acute findings. Electronically Signed   By: Andreas Newport M.D.   On: 03/16/2017 22:50   Ct Head Wo Contrast  Result Date: 03/16/2017 CLINICAL DATA:  83 year old male with under witnessed fall at home. Altered mental status. EXAM: CT HEAD WITHOUT CONTRAST CT CERVICAL SPINE WITHOUT CONTRAST TECHNIQUE: Multidetector CT imaging of the head and cervical spine was performed following the standard protocol without intravenous contrast. Multiplanar CT image reconstructions of the cervical spine were also generated. COMPARISON:  01/17/2017 and prior head CTs FINDINGS: CT HEAD FINDINGS Brain: No evidence of acute infarction, hemorrhage,  hydrocephalus, extra-axial collection or mass lesion/mass effect. Atrophy and chronic small-vessel white matter ischemic changes noted. Vascular: Intracranial atherosclerotic calcifications noted. Skull: Normal. Negative for fracture or focal lesion. Sinuses/Orbits: No acute finding. Other: None. CT CERVICAL SPINE FINDINGS Alignment: 2 mm anterolisthesis of C4 on C5 is degenerative. No acute subluxation noted. Skull base and vertebrae: No acute fracture. No primary bone lesion or focal pathologic process. Soft tissues and spinal canal: No prevertebral fluid or swelling. No visible canal hematoma. Disc levels:  Severe degenerative disc disease from C4-C7 noted. Upper chest: Negative. Other: None IMPRESSION: No evidence of acute intracranial abnormality. Atrophy and chronic small-vessel white matter ischemic changes. No static evidence of acute injury to the cervical spine. Severe degenerative disc disease from C4-C7. Electronically Signed   By: Margarette Canada M.D.   On: 03/16/2017 23:18   Ct Cervical Spine Wo  Contrast  Result Date: 03/16/2017 CLINICAL DATA:  83 year old male with under witnessed fall at home. Altered mental status. EXAM: CT HEAD WITHOUT CONTRAST CT CERVICAL SPINE WITHOUT CONTRAST TECHNIQUE: Multidetector CT imaging of the head and cervical spine was performed following the standard protocol without intravenous contrast. Multiplanar CT image reconstructions of the cervical spine were also generated. COMPARISON:  01/17/2017 and prior head CTs FINDINGS: CT HEAD FINDINGS Brain: No evidence of acute infarction, hemorrhage, hydrocephalus, extra-axial collection or mass lesion/mass effect. Atrophy and chronic small-vessel white matter ischemic changes noted. Vascular: Intracranial atherosclerotic calcifications noted. Skull: Normal. Negative for fracture or focal lesion. Sinuses/Orbits: No acute finding. Other: None. CT CERVICAL SPINE FINDINGS Alignment: 2 mm anterolisthesis of C4 on C5 is degenerative. No  acute subluxation noted. Skull base and vertebrae: No acute fracture. No primary bone lesion or focal pathologic process. Soft tissues and spinal canal: No prevertebral fluid or swelling. No visible canal hematoma. Disc levels:  Severe degenerative disc disease from C4-C7 noted. Upper chest: Negative. Other: None IMPRESSION: No evidence of acute intracranial abnormality. Atrophy and chronic small-vessel white matter ischemic changes. No static evidence of acute injury to the cervical spine. Severe degenerative disc disease from C4-C7. Electronically Signed   By: Margarette Canada M.D.   On: 03/16/2017 23:18   Dg Hip Unilat W Or W/o Pelvis Min 4 Views Left  Result Date: 03/16/2017 CLINICAL DATA:  Fall EXAM: DG HIP (WITH OR WITHOUT PELVIS) 4+V LEFT COMPARISON:  None. FINDINGS: There is a transverse fracture of the left femoral neck with approximately 2 cm of overriding. The femoral head remains situated in the acetabulum. No pelvic diastasis. No acute abnormality of the right hip. IMPRESSION: Left femoral neck transverse fracture Electronically Signed   By: Ulyses Jarred M.D.   On: 03/16/2017 22:50    Positive ROS: All other systems have been reviewed and were otherwise negative with the exception of those mentioned in the HPI and as above.  Objective: Labs cbc  Recent Labs  03/16/17 2317  WBC 14.1*  HGB 11.1*  HCT 34.7*  PLT 141*    Labs inflam No results for input(s): CRP in the last 72 hours.  Invalid input(s): ESR  Labs coag No results for input(s): INR, PTT in the last 72 hours.  Invalid input(s): PT   Recent Labs  03/16/17 2317  NA 136  K 3.4*  CL 100*  CO2 26  GLUCOSE 128*  BUN 11  CREATININE 3.70*  CALCIUM 8.2*    Physical Exam: Vitals:   03/17/17 0500 03/17/17 0600  BP: (!) 96/46 (!) 113/44  Pulse: 73 73  Resp: 15 17  Temp:     General: No acute distress.  Not interactive. Mental status: Does not respond to commands.  Follows movement with eyes Neurologic: No  gross focal findings or movement disorder appreciated. Respiratory: No cyanosis, no use of accessory musculature Cardiovascular: No pedal edema GI: Abdomen is soft and non-tender, non-distended. Skin: Warm and dry.  No lesions in the area of chief complaint  Extremities: Warm w/o edema Psychiatric: Patient is not competent for consent with normal mood and affect  MUSCULOSKELETAL:  Left LE: flexed at hip and knee.  Foot warm.   Does not respond to commands. RLE: chronic transmetatarsal amputation healed w/o abnormality. Other extremities are atraumatic with painless ROM and NVI.   Prudencio Burly III PA-C 03/17/2017 12:08 PM

## 2017-03-17 NOTE — Progress Notes (Signed)
Patient arrived to unit by bed.  Reviewed treatment plan and this RN agrees with plan.  Report received from bedside RN, Stanton Kidney.  Consent obtained.  Patient Alert,.   Lung sounds diminished to ausculation in all fields. No edema. Cardiac:  NSr.  Removed caps and cleansed RIj catheter with chlorhedxidine.  Aspirated ports of heparin and flushed them with saline per protocol.  Connected and secured lines, initiated treatment at 1533.  UF Goal of 500 mL and net fluid removal 0 L.  Will continue to monitor.

## 2017-03-17 NOTE — ED Notes (Signed)
Updated family on plan of care. Pt moving in bed independently without assistance.

## 2017-03-17 NOTE — Progress Notes (Signed)
Initial Nutrition Assessment  DOCUMENTATION CODES:   Severe malnutrition in context of chronic illness  INTERVENTION:   -RD will follow for diet advancement and supplement as appropriate. Family would like to trial Boost Breeze supplements when diet is advancement  NUTRITION DIAGNOSIS:   Malnutrition (Severe) related to chronic illness (ESRD on HD, dementia) as evidenced by percent weight loss, energy intake < 75% for > or equal to 1 month, severe depletion of body fat, moderate depletion of body fat, moderate depletions of muscle mass, severe depletion of muscle mass.  GOAL:   Patient will meet greater than or equal to 90% of their needs  MONITOR:   PO intake, Supplement acceptance, Diet advancement, Labs, Weight trends, Skin, I & O's  REASON FOR ASSESSMENT:   Consult Hip fracture protocol  ASSESSMENT:   Steven Forbes is a 83 y.o. male with medical history significant of ESRD dialysis MWF, advanced dementia, recent transmetatarsal amputation of R foot.  Patient had fall at home today.  Has been unable to bear weight on L hip since fall.  Pain better at rest, worse with movement.  Pt admitted with lt hip fx. Pt awaiting orthopedics evaluation for potential surgery.   Hx obtained from pt family at bedside. They describe a general decline in health over the past 6 months, mainly related to poor appetite and weight loss, which they suspect is related to pt's dementia. Pt typically consumes high fat "soul food cooking", however, is now much more selective about what he eats- mainly minced meats and mashed potatoes. Daughters estimate pt consumes about 50% less of what he normally does, but still is offered 3 meals per day. Pt also consumes Ensure supplements, however, pt typically gets diarrhea while consuming them, and are interested in trialing other supplement options.   Pt daughters estimate pt has lost about 40# in the past 6 months. Per wt hx, pt has experienced a 15.4% wt loss  over the past 6 months, which is significant for time frame.   Nutrition-Focused physical exam completed. Findings are moderate to severe fat depletion, moderate to severe muscle depletion, and no edema.   Pt family very knowledgeable about the role of good nutrition to support healing. Family with no further questions at this time, however, expressed appreciation for RD visit.   Medications include Phoslo.   Labs reviewed: K: 3.4, CBGS: 107.   Diet Order:  Diet NPO time specified  Skin:  Reviewed, no issues  Last BM:  PTA  Height:   Ht Readings from Last 1 Encounters:  03/16/17 5\' 3"  (1.6 m)    Weight:   Wt Readings from Last 1 Encounters:  03/16/17 120 lb (54.4 kg)    Ideal Body Weight:  56.4 kg  BMI:  Body mass index is 21.26 kg/m.  Estimated Nutritional Needs:   Kcal:  1400-1600  Protein:  70-85 grams  Fluid:  per MD  EDUCATION NEEDS:   Education needs addressed  Steven Forbes A. Jimmye Norman, RD, LDN, CDE Pager: [phone] After hours Pager: [phone]

## 2017-03-18 ENCOUNTER — Inpatient Hospital Stay (HOSPITAL_COMMUNITY): Payer: Medicare Other

## 2017-03-18 ENCOUNTER — Inpatient Hospital Stay (HOSPITAL_COMMUNITY): Payer: Medicare Other | Admitting: Anesthesiology

## 2017-03-18 ENCOUNTER — Encounter (HOSPITAL_COMMUNITY): Payer: Self-pay | Admitting: Anesthesiology

## 2017-03-18 ENCOUNTER — Encounter (HOSPITAL_COMMUNITY)
Admission: EM | Disposition: A | Discharge status: Skilled Nursing Facility | Payer: Self-pay | Source: Home / Self Care | Attending: Internal Medicine

## 2017-03-18 DIAGNOSIS — N186 End stage renal disease: Secondary | ICD-10-CM

## 2017-03-18 DIAGNOSIS — Z992 Dependence on renal dialysis: Secondary | ICD-10-CM

## 2017-03-18 DIAGNOSIS — S72002A Fracture of unspecified part of neck of left femur, initial encounter for closed fracture: Secondary | ICD-10-CM

## 2017-03-18 DIAGNOSIS — F0390 Unspecified dementia without behavioral disturbance: Secondary | ICD-10-CM

## 2017-03-18 HISTORY — PX: ANTERIOR APPROACH HEMI HIP ARTHROPLASTY: SHX6690

## 2017-03-18 LAB — GLUCOSE, CAPILLARY
GLUCOSE-CAPILLARY: 220 mg/dL — AB (ref 65–99)
GLUCOSE-CAPILLARY: 98 mg/dL (ref 65–99)
Glucose-Capillary: 59 mg/dL — ABNORMAL LOW (ref 65–99)
Glucose-Capillary: 130 mg/dL — ABNORMAL HIGH (ref 65–99)
Glucose-Capillary: 90 mg/dL (ref 65–99)
Glucose-Capillary: 84 mg/dL (ref 65–99)
Glucose-Capillary: 180 mg/dL — ABNORMAL HIGH (ref 65–99)

## 2017-03-18 LAB — SURGICAL PCR SCREEN
MRSA, PCR: NEGATIVE
Staphylococcus aureus: NEGATIVE

## 2017-03-18 LAB — BASIC METABOLIC PANEL
ANION GAP: 6 (ref 5–15)
BUN: 5 mg/dL — ABNORMAL LOW (ref 6–20)
CALCIUM: 7.7 mg/dL — AB (ref 8.9–10.3)
CO2: 28 mmol/L (ref 22–32)
Chloride: 100 mmol/L — ABNORMAL LOW (ref 101–111)
Creatinine, Ser: 2.52 mg/dL — ABNORMAL HIGH (ref 0.61–1.24)
GFR, EST AFRICAN AMERICAN: 25 mL/min — AB (ref >60)
GFR, EST NON AFRICAN AMERICAN: 21 mL/min — AB (ref >60)
Glucose, Bld: 79 mg/dL (ref 65–99)
POTASSIUM: 3.4 mmol/L — AB (ref 3.5–5.1)
Sodium: 134 mmol/L — ABNORMAL LOW (ref 135–145)

## 2017-03-18 LAB — CBC
HEMATOCRIT: 32.1 % — AB (ref 39.0–52.0)
HEMATOCRIT: 34.3 % — AB (ref 39.0–52.0)
Hemoglobin: 10.7 g/dL — ABNORMAL LOW (ref 13.0–17.0)
Hemoglobin: 11.3 g/dL — ABNORMAL LOW (ref 13.0–17.0)
MCH: 28.9 pg (ref 26.0–34.0)
MCH: 28.6 pg (ref 26.0–34.0)
MCHC: 33.3 g/dL (ref 30.0–36.0)
MCHC: 32.9 g/dL (ref 30.0–36.0)
MCV: 86.8 fL (ref 78.0–100.0)
MCV: 86.8 fL (ref 78.0–100.0)
Platelets: 169 10*3/uL (ref 150–400)
Platelets: 136 10*3/uL — ABNORMAL LOW (ref 150–400)
RBC: 3.7 MIL/uL — AB (ref 4.22–5.81)
RBC: 3.95 MIL/uL — ABNORMAL LOW (ref 4.22–5.81)
RDW: 17.1 % — AB (ref 11.5–15.5)
RDW: 16.9 % — AB (ref 11.5–15.5)
WBC: 12.4 10*3/uL — AB (ref 4.0–10.5)
WBC: 14.9 10*3/uL — ABNORMAL HIGH (ref 4.0–10.5)

## 2017-03-18 LAB — CREATININE, SERUM
Creatinine, Ser: 3.07 mg/dL — ABNORMAL HIGH (ref 0.61–1.24)
GFR calc non Af Amer: 17 mL/min — ABNORMAL LOW (ref >60)
GFR, EST AFRICAN AMERICAN: 19 mL/min — AB (ref >60)

## 2017-03-18 SURGERY — HEMIARTHROPLASTY, HIP, DIRECT ANTERIOR APPROACH, FOR FRACTURE
Anesthesia: General | Laterality: Left

## 2017-03-18 MED ORDER — ACETAMINOPHEN 650 MG RE SUPP
650.0000 mg | Freq: Four times a day (QID) | RECTAL | Status: DC | PRN
  Administered 2017-03-19: 650 mg via RECTAL
  Filled 2017-03-18 (×2): qty 1

## 2017-03-18 MED ORDER — SODIUM CHLORIDE 0.9 % IV SOLN
INTRAVENOUS | Status: DC | PRN
  Administered 2017-03-18: 07:00:00 via INTRAVENOUS

## 2017-03-18 MED ORDER — FENTANYL CITRATE (PF) 100 MCG/2ML IJ SOLN: 25.0000 ug | INTRAMUSCULAR | Status: DC | PRN

## 2017-03-18 MED ORDER — ALUM & MAG HYDROXIDE-SIMETH 200-200-20 MG/5ML PO SUSP: 30.0000 mL | ORAL | Status: DC | PRN

## 2017-03-18 MED ORDER — METOCLOPRAMIDE HCL 5 MG/ML IJ SOLN
5.0000 mg | Freq: Three times a day (TID) | INTRAMUSCULAR | Status: DC | PRN
Start: 2017-03-18 — End: 2017-03-21

## 2017-03-18 MED ORDER — HYDROCODONE-ACETAMINOPHEN 5-325 MG PO TABS
1.0000 | ORAL_TABLET | Freq: Four times a day (QID) | ORAL | Status: DC | PRN
  Administered 2017-03-18: 1 via ORAL
  Filled 2017-03-18: qty 1

## 2017-03-18 MED ORDER — OXYCODONE HCL 5 MG PO TABS
5.0000 mg | ORAL_TABLET | ORAL | Status: DC | PRN
  Administered 2017-03-18 – 2017-03-21 (×6): 10 mg via ORAL
  Filled 2017-03-18 (×7): qty 2

## 2017-03-18 MED ORDER — SUCCINYLCHOLINE CHLORIDE 20 MG/ML IJ SOLN
INTRAMUSCULAR | Status: DC | PRN
  Administered 2017-03-18: 60 mg via INTRAVENOUS

## 2017-03-18 MED ORDER — MENTHOL 3 MG MT LOZG: 1.0000 | LOZENGE | OROMUCOSAL | Status: DC | PRN

## 2017-03-18 MED ORDER — CEFAZOLIN SODIUM-DEXTROSE 2-3 GM-% IV SOLR
2.0000 g | Freq: Once | INTRAVENOUS | Status: AC
  Administered 2017-03-18: 2 g via INTRAVENOUS
  Filled 2017-03-18: qty 50

## 2017-03-18 MED ORDER — MEMANTINE HCL 10 MG PO TABS
5.0000 mg | ORAL_TABLET | Freq: Every day | ORAL | Status: DC
  Administered 2017-03-18 – 2017-03-21 (×4): 5 mg via ORAL
  Filled 2017-03-18 (×4): qty 1

## 2017-03-18 MED ORDER — DEXTROSE 50 % IV SOLN
INTRAVENOUS | Status: AC
  Administered 2017-03-18: 50 mL
  Filled 2017-03-18: qty 50

## 2017-03-18 MED ORDER — EPHEDRINE 5 MG/ML INJ
INTRAVENOUS | Status: AC
  Filled 2017-03-18: qty 10

## 2017-03-18 MED ORDER — CEFAZOLIN SODIUM-DEXTROSE 2-4 GM/100ML-% IV SOLN
2.0000 g | Freq: Four times a day (QID) | INTRAVENOUS | Status: AC
  Administered 2017-03-18 (×2): 2 g via INTRAVENOUS
  Filled 2017-03-18 (×3): qty 100

## 2017-03-18 MED ORDER — HYDROCODONE-ACETAMINOPHEN 7.5-325 MG PO TABS: 1.0000 | ORAL_TABLET | Freq: Four times a day (QID) | ORAL | 0 refills | Status: AC | PRN

## 2017-03-18 MED ORDER — PHENYLEPHRINE HCL 10 MG/ML IJ SOLN
INTRAVENOUS | Status: DC | PRN
  Administered 2017-03-18: 50 ug/min via INTRAVENOUS

## 2017-03-18 MED ORDER — 0.9 % SODIUM CHLORIDE (POUR BTL) OPTIME
TOPICAL | Status: DC | PRN
  Administered 2017-03-18: 1000 mL

## 2017-03-18 MED ORDER — EPHEDRINE SULFATE-NACL 50-0.9 MG/10ML-% IV SOSY
PREFILLED_SYRINGE | INTRAVENOUS | Status: DC | PRN
  Administered 2017-03-18: 10 mg via INTRAVENOUS

## 2017-03-18 MED ORDER — METHOCARBAMOL 1000 MG/10ML IJ SOLN: 500.0000 mg | Freq: Four times a day (QID) | INTRAVENOUS | Status: DC | PRN

## 2017-03-18 MED ORDER — CEFAZOLIN SODIUM 1 G IJ SOLR
INTRAMUSCULAR | Status: AC
  Filled 2017-03-18: qty 20

## 2017-03-18 MED ORDER — GLUCAGON HCL RDNA (DIAGNOSTIC) 1 MG IJ SOLR
INTRAMUSCULAR | Status: AC
  Filled 2017-03-18: qty 1

## 2017-03-18 MED ORDER — FENTANYL CITRATE (PF) 250 MCG/5ML IJ SOLN
INTRAMUSCULAR | Status: AC
  Filled 2017-03-18: qty 5

## 2017-03-18 MED ORDER — SODIUM CHLORIDE 0.9 % IJ SOLN
INTRAMUSCULAR | Status: AC
  Filled 2017-03-18: qty 10

## 2017-03-18 MED ORDER — MORPHINE SULFATE (PF) 2 MG/ML IV SOLN: 0.5000 mg | INTRAVENOUS | Status: DC | PRN

## 2017-03-18 MED ORDER — ONDANSETRON HCL 4 MG/2ML IJ SOLN
INTRAMUSCULAR | Status: DC | PRN
  Administered 2017-03-18: 4 mg via INTRAVENOUS

## 2017-03-18 MED ORDER — ENOXAPARIN SODIUM 30 MG/0.3ML ~~LOC~~ SOLN
30.0000 mg | SUBCUTANEOUS | Status: DC
Start: 2017-03-19 — End: 2017-03-21
  Administered 2017-03-19 – 2017-03-21 (×3): 30 mg via SUBCUTANEOUS
  Filled 2017-03-18 (×3): qty 0.3

## 2017-03-18 MED ORDER — PHENOL 1.4 % MT LIQD: 1.0000 | OROMUCOSAL | Status: DC | PRN

## 2017-03-18 MED ORDER — ENOXAPARIN SODIUM 30 MG/0.3ML ~~LOC~~ SOLN: 30.0000 mg | SUBCUTANEOUS | 0 refills | Status: DC

## 2017-03-18 MED ORDER — PROPOFOL 10 MG/ML IV BOLUS
INTRAVENOUS | Status: AC
  Filled 2017-03-18: qty 20

## 2017-03-18 MED ORDER — FENTANYL CITRATE (PF) 100 MCG/2ML IJ SOLN
INTRAMUSCULAR | Status: DC | PRN
  Administered 2017-03-18 (×2): 50 ug via INTRAVENOUS

## 2017-03-18 MED ORDER — SODIUM CHLORIDE 0.9 % IR SOLN
Status: DC | PRN
  Administered 2017-03-18: 3000 mL

## 2017-03-18 MED ORDER — PROPOFOL 10 MG/ML IV BOLUS
INTRAVENOUS | Status: DC | PRN
  Administered 2017-03-18: 60 mg via INTRAVENOUS

## 2017-03-18 MED ORDER — SODIUM CHLORIDE 0.9 % IV SOLN
1000.0000 mg | INTRAVENOUS | Status: AC
  Administered 2017-03-18: 1000 mg via INTRAVENOUS
  Filled 2017-03-18: qty 10

## 2017-03-18 MED ORDER — METHOCARBAMOL 500 MG PO TABS
500.0000 mg | ORAL_TABLET | Freq: Four times a day (QID) | ORAL | Status: DC | PRN
  Administered 2017-03-21: 500 mg via ORAL
  Filled 2017-03-18 (×2): qty 1

## 2017-03-18 MED ORDER — LIDOCAINE 2% (20 MG/ML) 5 ML SYRINGE
INTRAMUSCULAR | Status: AC
  Filled 2017-03-18: qty 5

## 2017-03-18 MED ORDER — ACETAMINOPHEN 325 MG PO TABS
650.0000 mg | ORAL_TABLET | Freq: Four times a day (QID) | ORAL | Status: DC | PRN
  Filled 2017-03-18: qty 2

## 2017-03-18 MED ORDER — SUCCINYLCHOLINE CHLORIDE 200 MG/10ML IV SOSY
PREFILLED_SYRINGE | INTRAVENOUS | Status: AC
  Filled 2017-03-18: qty 10

## 2017-03-18 MED ORDER — SODIUM CHLORIDE 0.9 % IV SOLN
INTRAVENOUS | Status: DC
  Administered 2017-03-18 (×2): via INTRAVENOUS

## 2017-03-18 MED ORDER — ONDANSETRON HCL 4 MG PO TABS: 4.0000 mg | ORAL_TABLET | Freq: Four times a day (QID) | ORAL | Status: DC | PRN

## 2017-03-18 MED ORDER — METOCLOPRAMIDE HCL 5 MG PO TABS: 5.0000 mg | ORAL_TABLET | Freq: Three times a day (TID) | ORAL | Status: DC | PRN

## 2017-03-18 MED ORDER — ONDANSETRON HCL 4 MG/2ML IJ SOLN
INTRAMUSCULAR | Status: AC
  Filled 2017-03-18: qty 2

## 2017-03-18 MED ORDER — ONDANSETRON HCL 4 MG/2ML IJ SOLN: 4.0000 mg | Freq: Four times a day (QID) | INTRAMUSCULAR | Status: DC | PRN

## 2017-03-18 SURGICAL SUPPLY — 44 items
BLADE SAGITTAL (BLADE) ×3
BLADE SAW THK.89X75X18XSGTL (BLADE) ×1 IMPLANT
CAPT HIP HEMI 2 ×2 IMPLANT
COVER SURGICAL LIGHT HANDLE (MISCELLANEOUS) ×3 IMPLANT
DRAPE HIP W/POCKET STRL (DRAPE) ×3 IMPLANT
DRAPE IMP U-DRAPE 54X76 (DRAPES) ×3 IMPLANT
DRAPE INCISE IOBAN 85X60 (DRAPES) ×6 IMPLANT
DRAPE U-SHAPE 47X51 STRL (DRAPES) ×3 IMPLANT
DRESSING MEPILEX BORDER 6X8 (GAUZE/BANDAGES/DRESSINGS) IMPLANT
DRSG MEPILEX BORDER 4X8 (GAUZE/BANDAGES/DRESSINGS) ×3 IMPLANT
DRSG MEPILEX BORDER 6X8 (GAUZE/BANDAGES/DRESSINGS) ×3
DURAPREP 26ML APPLICATOR (WOUND CARE) ×3 IMPLANT
ELECT BLADE 6.5 EXT (BLADE) IMPLANT
ELECT CAUTERY BLADE 6.4 (BLADE) ×3 IMPLANT
ELECT REM PT RETURN 9FT ADLT (ELECTROSURGICAL) ×3
ELECTRODE REM PT RTRN 9FT ADLT (ELECTROSURGICAL) ×1 IMPLANT
EVACUATOR 1/8 PVC DRAIN (DRAIN) IMPLANT
GLOVE BIOGEL PI IND STRL 7.5 (GLOVE) ×1 IMPLANT
GLOVE BIOGEL PI IND STRL 8 (GLOVE) ×1 IMPLANT
GLOVE BIOGEL PI INDICATOR 7.5 (GLOVE) ×2
GLOVE BIOGEL PI INDICATOR 8 (GLOVE) ×2
GLOVE ORTHO TXT STRL SZ7.5 (GLOVE) ×6 IMPLANT
GOWN STRL REUS W/ TWL LRG LVL3 (GOWN DISPOSABLE) ×2 IMPLANT
GOWN STRL REUS W/ TWL XL LVL3 (GOWN DISPOSABLE) ×4 IMPLANT
GOWN STRL REUS W/TWL LRG LVL3 (GOWN DISPOSABLE) ×6
GOWN STRL REUS W/TWL XL LVL3 (GOWN DISPOSABLE) ×12
KIT BASIN OR (CUSTOM PROCEDURE TRAY) ×3 IMPLANT
KIT ROOM TURNOVER OR (KITS) ×3 IMPLANT
MANIFOLD NEPTUNE II (INSTRUMENTS) ×3 IMPLANT
NS IRRIG 1000ML POUR BTL (IV SOLUTION) ×3 IMPLANT
PACK TOTAL JOINT (CUSTOM PROCEDURE TRAY) ×3 IMPLANT
PACK UNIVERSAL I (CUSTOM PROCEDURE TRAY) ×3 IMPLANT
PAD ARMBOARD 7.5X6 YLW CONV (MISCELLANEOUS) ×6 IMPLANT
STAPLER VISISTAT 35W (STAPLE) ×3 IMPLANT
SUT ETHIBOND 2 V 37 (SUTURE) ×2 IMPLANT
SUT ETHIBOND NAB CT1 #1 30IN (SUTURE) ×6 IMPLANT
SUT VIC AB 1 CT1 27 (SUTURE) ×6
SUT VIC AB 1 CT1 27XBRD ANBCTR (SUTURE) IMPLANT
SUT VIC AB 1 CTB1 27 (SUTURE) ×6 IMPLANT
SUT VIC AB 2-0 CT1 27 (SUTURE) ×9
SUT VIC AB 2-0 CT1 TAPERPNT 27 (SUTURE) ×2 IMPLANT
TOWEL OR 17X24 6PK STRL BLUE (TOWEL DISPOSABLE) ×3 IMPLANT
TOWEL OR 17X26 10 PK STRL BLUE (TOWEL DISPOSABLE) ×3 IMPLANT
TRAY FOLEY W/METER SILVER 16FR (SET/KITS/TRAYS/PACK) IMPLANT

## 2017-03-18 NOTE — Anesthesia Preprocedure Evaluation (Addendum)
Anesthesia Evaluation  Patient identified by MRN, date of birth, ID band Patient awake    Reviewed: Allergy & Precautions, NPO status , Patient's Chart, lab work & pertinent test results  Airway Mallampati: II  TM Distance: >3 FB Neck ROM: Full    Dental  (+) Edentulous Upper, Edentulous Lower   Pulmonary shortness of breath, asthma ,    breath sounds clear to auscultation       Cardiovascular hypertension, Pt. on medications + CAD, + Past MI, + Peripheral Vascular Disease and +CHF  + Valvular Problems/Murmurs AS and AI  Rhythm:Regular + Systolic murmurs    Neuro/Psych PSYCHIATRIC DISORDERS dementia    GI/Hepatic Neg liver ROS, GERD  Medicated and Controlled,  Endo/Other  diabetes, Type 2  Renal/GU ESRF and DialysisRenal diseaseDialysis yesterday     Musculoskeletal   Abdominal   Peds  Hematology  (+) anemia ,   Anesthesia Other Findings Mild AS, mean 36mmHg, mod AI, EF 55%  Reproductive/Obstetrics                            Anesthesia Physical Anesthesia Plan  ASA: III  Anesthesia Plan: General   Post-op Pain Management:    Induction: Intravenous  PONV Risk Score and Plan: 2 and Ondansetron  Airway Management Planned: Oral ETT  Additional Equipment: None  Intra-op Plan:   Post-operative Plan: Extubation in OR  Informed Consent: I have reviewed the patients History and Physical, chart, labs and discussed the procedure including the risks, benefits and alternatives for the proposed anesthesia with the patient or authorized representative who has indicated his/her understanding and acceptance.   Dental advisory given  Plan Discussed with: CRNA, Anesthesiologist and Surgeon  Anesthesia Plan Comments:        Anesthesia Quick Evaluation

## 2017-03-18 NOTE — Progress Notes (Signed)
[facility] Progress Note  Subjective: sp surg this am  Vitals:   03/18/17 1026 03/18/17 1040 03/18/17 1041 03/18/17 1110  BP: (!) 156/70  (!) 143/68 (!) 144/62  Pulse: 73  75 80  Resp: 13  13 16   Temp:  97.3 F (36.3 C)  97.7 F (36.5 C)  TempSrc:    Axillary  SpO2: 100%  100% 100%  Weight:      Height:        Inpatient medications: . atorvastatin  80 mg Oral Daily  . calcium acetate  667 mg Oral TID WC  . [START ON 03/19/2017] enoxaparin (LOVENOX) injection  30 mg Subcutaneous Q24H  . feeding supplement  1 Container Oral TID BM  . memantine  5 mg Oral Daily  . pantoprazole  40 mg Oral Daily   . sodium chloride 125 mL/hr at 03/18/17 1159  .  ceFAZolin (ANCEF) IV     acetaminophen **OR** acetaminophen, albuterol, alum & mag hydroxide-simeth, HYDROcodone-acetaminophen, labetalol, menthol-cetylpyridinium **OR** phenol, methocarbamol **OR** [DISCONTINUED] methocarbamol (ROBAXIN)  IV, metoCLOPramide **OR** metoCLOPramide (REGLAN) injection, morphine injection, ondansetron **OR** ondansetron (ZOFRAN) IV, oxyCODONE, polyethylene glycol  Exam: Awake, groggy, pleasant ly confused No jvd Chest CTA bilat  RRR 2/6 SEM no RG Abd soft ntnd no mass or ascites Ext toe amps R foot.  No LE edema NF, O x1 only R  IJ TDC   General:  Head: Normocephalic, atraumatic, sclera non-icteric, mucus membranes are moist Neck: Supple. JVD not elevated. Lungs: Clear bilaterally to auscultation without wheezes, rales, or rhonchi. Breathing is unlabored. Heart: RRR with S1 S2. 3/6 Systolic M.  Abdomen: Soft, non-tender, non-distended with normoactive bowel sounds. No rebound/guarding. No obvious abdominal masses. M-S:  Strength and tone appear normal for age. Lower extremities: slight external rotation of LLE. Amputated toes R foot. Trace LE edema.  Neuro: a Moves all extremities spontaneously. Psych:  Responds to questions appropriately with a normal affect. Dialysis  Access:  Dialysis: [facility] MWF 4h  400/A1.5  56kg  3K/2.25 bath  P4  Hep 5000 R IJ TDC (Dr Augustin Coupe 01/16/17) -Hectorol 2 mcg IV TIW -Mircera 75 mcg IV Q 2 weeks (Last dose 03/15/17 Last HGB 10.4 03/15/17)   Assess: 1.  L displaced Femoral Neck Fx: went to OR for repair today.  2.  ESRD - MWF HD. Had HD yesterday evening. 3.  Hypertension/volume  - CXR unremarkable. No evidence of volume overload on exam. Under dry wt a bit. BP's 140/80, stable.  4.  Anemia  - HGB 11.1 today. ESA dose given 03/15/17. Follow HGB 5.  Metabolic bone disease - cont binder/VDRA 6.  Nutrition - renal diet, renal vit/nepro 7.  DM: per primary  Plan - no new suggestions, will follow.    Kelly Splinter MD [facility] pager [phone]   03/18/2017, 4:58 PM    Recent Labs Lab 03/16/17 2317 03/17/17 1544 03/18/17 0326 03/18/17 1201  NA 136 135 134*  --   K 3.4* 2.9* 3.4*  --   CL 100* 99* 100*  --   CO2 26 26 28   --   GLUCOSE 128* 138* 79  --   BUN 11 15 5*  --   CREATININE 3.70* 4.57* 2.52* 3.07*  CALCIUM 8.2* 8.0* 7.7*  --   PHOS  --  3.6  --   --     Recent Labs Lab 03/17/17 1544  ALBUMIN 1.7*    Recent Labs Lab 03/16/17 2317 03/17/17 1544 03/18/17 0326 03/18/17 1201  WBC 14.1* 13.6* 12.4* 14.9*  NEUTROABS 12.0*  --   --   --   HGB 11.1* 10.2* 10.7* 11.3*  HCT 34.7* 31.6* 32.1* 34.3*  MCV 88.1 87.3 86.8 86.8  PLT 141* 145* 169 136*   Iron/TIBC/Ferritin/ %Sat    Component Value Date/Time   IRON 31 (L) 05/27/2011 0757   TIBC 170 (L) 05/27/2011 0757   FERRITIN 840 (H) 05/27/2011 0757   IRONPCTSAT 18 (L) 05/27/2011 0757

## 2017-03-18 NOTE — Progress Notes (Signed)
Patient CBG is 59. Dextrose given and recheck in 15 minutes.

## 2017-03-18 NOTE — Discharge Instructions (Signed)
° ° °  1. Change dressings as needed °2. May shower but keep incisions covered and dry °3. Take lovenox to prevent blood clots °4. Take stool softeners as needed °5. Take pain meds as needed ° °

## 2017-03-18 NOTE — Anesthesia Procedure Notes (Signed)
Procedure Name: Intubation Date/Time: 03/18/2017 7:49 AM Performed by: Kyung Rudd Pre-anesthesia Checklist: Patient identified, Emergency Drugs available, Suction available and Patient being monitored Patient Re-evaluated:Patient Re-evaluated prior to inductionOxygen Delivery Method: Circle system utilized Preoxygenation: Pre-oxygenation with 100% oxygen Intubation Type: IV induction Ventilation: Mask ventilation without difficulty Laryngoscope Size: Mac and 4 Grade View: Grade I Tube type: Oral Tube size: 7.5 mm Number of attempts: 1 Airway Equipment and Method: Stylet Placement Confirmation: ETT inserted through vocal cords under direct vision,  positive ETCO2 and breath sounds checked- equal and bilateral Secured at: 22 cm Tube secured with: Tape Dental Injury: Teeth and Oropharynx as per pre-operative assessment

## 2017-03-18 NOTE — Op Note (Signed)
ANTERIOR APPROACH HEMI HIP ARTHROPLASTY LEFT  Procedure Note Steven Forbes   [id number]  Pre-op Diagnosis: Hip Fracture     Post-op Diagnosis: same   Operative Procedures  1. Prosthetic replacement for femoral neck fracture. CPT [phone]  Personnel  Surgeon(s): Leandrew Koyanagi, MD   Anesthesia: general  Prosthesis: Depuy Femur: corail KA 13 Head: 47 mm size: +5 Bearing Type: bipolar  Hip Hemiarthroplasty (Anterior Approach) Op Note:  After informed consent was obtained and the operative extremity marked in the holding area, the patient was brought back to the operating room and placed supine on the HANA table. Next, the operative extremity was prepped and draped in normal sterile fashion. Surgical timeout occurred verifying patient identification, surgical site, surgical procedure and administration of antibiotics.  A modified anterior Smith-Peterson approach to the hip was performed, using the interval between tensor fascia lata and sartorius.  Dissection was carried bluntly down onto the anterior hip capsule. The lateral femoral circumflex vessels were identified and coagulated. A capsulotomy was performed and the capsular flaps tagged for later repair.  Fluoroscopy was utilized to prepare for the femoral neck cut. The neck osteotomy was performed. The femoral head was removed and found a 47 mm head was the appropriate fit.    We then turned our attention to the femur.  After placing the femoral hook, the leg was taken to externally rotated, extended and adducted position taking care to perform soft tissue releases to allow for adequate mobilization of the femur. Soft tissue was cleared from the shoulder of the greater trochanter and the hook elevator used to improve exposure of the proximal femur. Sequential broaching performed up to a size 13. Trial neck and head were placed. The leg was brought back up to neutral and the construct reduced. The position and sizing of components, offset and  leg lengths were checked using fluoroscopy. Stability of the construct was checked in extension and external rotation without any subluxation or impingement of prosthesis. We dislocated the prosthesis, dropped the leg back into position, removed trial components, and irrigated copiously. The final stem and head was then placed, the leg brought back up, the system reduced and fluoroscopy used to verify positioning.  We irrigated, obtained hemostasis and closed the capsule using #2 ethibond suture.  The fascia was closed with #1 vicryl plus, the deep fat layer was closed with 0 vicryl, the subcutaneous layers closed with 2.0 Vicryl Plus and the skin closed with staples. A sterile dressing was applied. The patient was awakened in the operating room and taken to recovery in stable condition. All sponge, needle, and instrument counts were correct at the end of the case.   Position: supine  Complications: none.  Time Out: performed   Drains/Packing: none  Estimated blood loss: 100  Returned to Recovery Room: in good condition.   Antibiotics: yes   Mechanical VTE (DVT) Prophylaxis: sequential compression devices, TED thigh-high  Chemical VTE (DVT) Prophylaxis: lovenox  Fluid Replacement: Crystalloid: see anesthesia record  Specimens Removed: 1 to pathology   Sponge and Instrument Count Correct? yes   PACU: portable radiograph - low AP   Admission: inpatient status, start PT & OT POD#1  Plan/RTC: Return in 2 weeks for staple removal. Return in 6 weeks to see MD.  Weight Bearing/Load Lower Extremity: full  Hip precautions: none Suture Removal: 10-14 days  Betadine to incision twice daily once dressing is removed on POD#7  N. Eduard Roux, MD [place] 9:14 AM  Implant Name Type Inv. Item Serial No. Manufacturer Lot No. LRB No. Used  STEM CORAIL KA13 - [id number] Stem STEM CORAIL KA13  DEPUY SYNTHES  Left 1  HIP BALL ARTICU DEPUY - [id number] Hips HIP BALL  ARTICU DEPUY  DEPUY SYNTHES  Left 1  BIPOLAR DEPUY 47MM - [id number] Hips BIPOLAR DEPUY 47MM   DEPUY SYNTHES   Left 1

## 2017-03-18 NOTE — Progress Notes (Signed)
[facility] PROGRESS NOTE  Nyquan Selbe TKP:[id number] DOB: 1926-05-04 DOA: 03/16/2017  PCP: Shon Baton, MD  Brief History/Interval Summary: 83 year old African-American male with a past medical history of end-stage renal disease on hemodialysis on Monday, Wednesday, Friday, advanced dementia, history of transmetatarsal amputation of the right foot, presented to the emergency department after he was unable to bear weight on the left leg after having a fall at home. Found to have a left hip fracture. Hospitalized for further management.  Reason for Visit: Left hip fracture  Consultants: Orthopedics. Nephrology.  Procedures:  Transthoracic echocardiogram Study Conclusions  - Left ventricle: The cavity size was normal. Systolic function was   normal. The estimated ejection fraction was in the range of 50%   to 55%. There is hypokinesis of the basal inferior and   inferolateral walls. - Aortic valve: Valve mobility was restricted. There was moderate   stenosis. There was moderate regurgitation. Mean gradient (S): 18   mm Hg. Peak gradient (S): 35 mm Hg. Valve area (VTI): 1.19 cm^2.   Valve area (Vmax): 1.11 cm^2. Valve area (Vmean): 0.99 cm^2. - Aortic root: The aortic root was normal in size. - Mitral valve: Calcified annulus. Mildly thickened leaflets .   There was moderate to severe regurgitation. - Right ventricle: Systolic function was normal. - Tricuspid valve: There was mild regurgitation. - Pulmonary arteries: Systolic pressure was mildly increased. PA   peak pressure: 32 mm Hg (S). - Line: A venous catheter was visualized in the superior vena cava,   with its tip in the right atrium. No abnormal features noted.  Prosthetic replacement for left hip fracture 6/30  Antibiotics: None  Subjective/Interval History: Patient seen at PACU. He is awake. Smiles. Does not answer questions, presumably due to his dementia. Follows some commands.  ROS: Unable to  do  Objective:  Vital Signs  Vitals:   03/18/17 1026 03/18/17 1040 03/18/17 1041 03/18/17 1110  BP: (!) 156/70  (!) 143/68 (!) 144/62  Pulse: 73  75 80  Resp: 13  13 16   Temp:  97.3 F (36.3 C)  97.7 F (36.5 C)  TempSrc:    Axillary  SpO2: 100%  100% 100%  Weight:      Height:        Intake/Output Summary (Last 24 hours) at 03/18/17 1206 Last data filed at 03/18/17 1000  Gross per 24 hour  Intake              325 ml  Output              101 ml  Net              224 ml   Filed Weights   03/16/17 2136 03/17/17 1524 03/17/17 1833  Weight: 54.4 kg (120 lb) 54.5 kg (120 lb 2.4 oz) 54.5 kg (120 lb 2.4 oz)    General appearance: Awake, alert. No distress Resp: Clear to auscultation bilaterally Cardio: S1, S2 is normal. Regular. Systolic murmur appreciated over the aortic area. GI: Abdomen is soft. Nontender, nondistended. Bowel sounds are present. No masses or organomegaly Extremities: Surgical site covered with dressing. No obvious bruising noted. Neurologic: No focal deficits. He has dementia.  Lab Results:  Data Reviewed: I have personally reviewed following labs and imaging studies  CBC:  Recent Labs Lab 03/16/17 2317 03/17/17 1544 03/18/17 0326  WBC 14.1* 13.6* 12.4*  NEUTROABS 12.0*  --   --   HGB 11.1* 10.2* 10.7*  HCT 34.7* 31.6* 32.1*  MCV 88.1 87.3 86.8  PLT 141* 145* 453    Basic Metabolic Panel:  Recent Labs Lab 03/16/17 2317 03/17/17 1544 03/18/17 0326  NA 136 135 134*  K 3.4* 2.9* 3.4*  CL 100* 99* 100*  CO2 26 26 28   GLUCOSE 128* 138* 79  BUN 11 15 5*  CREATININE 3.70* 4.57* 2.52*  CALCIUM 8.2* 8.0* 7.7*  PHOS  --  3.6  --     GFR: Estimated Creatinine Clearance: 15.6 mL/min (A) (by C-G formula based on SCr of 2.52 mg/dL (H)).  CBG:  Recent Labs Lab 03/18/17 0202 03/18/17 0615 03/18/17 0652 03/18/17 1025 03/18/17 1112  GLUCAP 98 59* 130* 90 84    Radiology Studies: Dg Chest 1 View  Result Date: 03/16/2017 CLINICAL  DATA:  Decreased mobility after an unwitnessed fall at home. EXAM: CHEST 1 VIEW COMPARISON:  01/17/2017 FINDINGS: Right jugular dual-lumen catheter extends into the right atrium. Mild cardiomegaly, unchanged. The lungs are clear. The pulmonary vasculature is normal. No pleural effusion. IMPRESSION: Stable cardiomegaly.  No acute findings. Electronically Signed   By: Andreas Newport M.D.   On: 03/16/2017 22:50   Ct Head Wo Contrast  Result Date: 03/16/2017 CLINICAL DATA:  83 year old male with under witnessed fall at home. Altered mental status. EXAM: CT HEAD WITHOUT CONTRAST CT CERVICAL SPINE WITHOUT CONTRAST TECHNIQUE: Multidetector CT imaging of the head and cervical spine was performed following the standard protocol without intravenous contrast. Multiplanar CT image reconstructions of the cervical spine were also generated. COMPARISON:  01/17/2017 and prior head CTs FINDINGS: CT HEAD FINDINGS Brain: No evidence of acute infarction, hemorrhage, hydrocephalus, extra-axial collection or mass lesion/mass effect. Atrophy and chronic small-vessel white matter ischemic changes noted. Vascular: Intracranial atherosclerotic calcifications noted. Skull: Normal. Negative for fracture or focal lesion. Sinuses/Orbits: No acute finding. Other: None. CT CERVICAL SPINE FINDINGS Alignment: 2 mm anterolisthesis of C4 on C5 is degenerative. No acute subluxation noted. Skull base and vertebrae: No acute fracture. No primary bone lesion or focal pathologic process. Soft tissues and spinal canal: No prevertebral fluid or swelling. No visible canal hematoma. Disc levels:  Severe degenerative disc disease from C4-C7 noted. Upper chest: Negative. Other: None IMPRESSION: No evidence of acute intracranial abnormality. Atrophy and chronic small-vessel white matter ischemic changes. No static evidence of acute injury to the cervical spine. Severe degenerative disc disease from C4-C7. Electronically Signed   By: Margarette Canada M.D.   On:  03/16/2017 23:18   Ct Cervical Spine Wo Contrast  Result Date: 03/16/2017 CLINICAL DATA:  83 year old male with under witnessed fall at home. Altered mental status. EXAM: CT HEAD WITHOUT CONTRAST CT CERVICAL SPINE WITHOUT CONTRAST TECHNIQUE: Multidetector CT imaging of the head and cervical spine was performed following the standard protocol without intravenous contrast. Multiplanar CT image reconstructions of the cervical spine were also generated. COMPARISON:  01/17/2017 and prior head CTs FINDINGS: CT HEAD FINDINGS Brain: No evidence of acute infarction, hemorrhage, hydrocephalus, extra-axial collection or mass lesion/mass effect. Atrophy and chronic small-vessel white matter ischemic changes noted. Vascular: Intracranial atherosclerotic calcifications noted. Skull: Normal. Negative for fracture or focal lesion. Sinuses/Orbits: No acute finding. Other: None. CT CERVICAL SPINE FINDINGS Alignment: 2 mm anterolisthesis of C4 on C5 is degenerative. No acute subluxation noted. Skull base and vertebrae: No acute fracture. No primary bone lesion or focal pathologic process. Soft tissues and spinal canal: No prevertebral fluid or swelling. No visible canal hematoma. Disc levels:  Severe degenerative disc disease from C4-C7 noted. Upper chest: Negative. Other: None  IMPRESSION: No evidence of acute intracranial abnormality. Atrophy and chronic small-vessel white matter ischemic changes. No static evidence of acute injury to the cervical spine. Severe degenerative disc disease from C4-C7. Electronically Signed   By: Margarette Canada M.D.   On: 03/16/2017 23:18   Pelvis Portable  Result Date: 03/18/2017 CLINICAL DATA:  Left hip surgery. EXAM: DG C-ARM 61-120 MIN; PORTABLE PELVIS 1-2 VIEWS COMPARISON:  03/16/2017 FINDINGS: Bipolar hip prosthesis in place. The femoral component is well seated. No complicating features. The pubic symphysis is intact. No definite pelvic fractures. IMPRESSION: Bipolar type left hip prosthesis  in good position without complicating features. Electronically Signed   By: Marijo Sanes M.D.   On: 03/18/2017 10:19   Dg C-arm 1-60 Min  Result Date: 03/18/2017 CLINICAL DATA:  Left hip surgery. EXAM: DG C-ARM 61-120 MIN; PORTABLE PELVIS 1-2 VIEWS COMPARISON:  03/16/2017 FINDINGS: Bipolar hip prosthesis in place. The femoral component is well seated. No complicating features. The pubic symphysis is intact. No definite pelvic fractures. IMPRESSION: Bipolar type left hip prosthesis in good position without complicating features. Electronically Signed   By: Marijo Sanes M.D.   On: 03/18/2017 10:19   Dg Hip Operative Unilat W Or W/o Pelvis Left  Result Date: 03/18/2017 CLINICAL DATA:  83 year old male undergoing left anterior hip replacement for femoral neck fracture EXAM: OPERATIVE LEFT HIP (WITH PELVIS IF PERFORMED) 2 VIEWS TECHNIQUE: Fluoroscopic spot image(s) were submitted for interpretation post-operatively. COMPARISON:  Preoperative radiographs 03/16/2017 FINDINGS: Intraoperative radiographs demonstrates in progress left hip arthroplasty. No evidence of immediate hardware complication. IMPRESSION: In progress left hip arthroplasty without evidence of complication. Electronically Signed   By: Jacqulynn Cadet M.D.   On: 03/18/2017 10:17   Dg Hip Unilat W Or W/o Pelvis Min 4 Views Left  Result Date: 03/16/2017 CLINICAL DATA:  Fall EXAM: DG HIP (WITH OR WITHOUT PELVIS) 4+V LEFT COMPARISON:  None. FINDINGS: There is a transverse fracture of the left femoral neck with approximately 2 cm of overriding. The femoral head remains situated in the acetabulum. No pelvic diastasis. No acute abnormality of the right hip. IMPRESSION: Left femoral neck transverse fracture Electronically Signed   By: Ulyses Jarred M.D.   On: 03/16/2017 22:50     Medications:  Scheduled: . atorvastatin  80 mg Oral Daily  . calcium acetate  667 mg Oral TID WC  . [START ON 03/19/2017] enoxaparin (LOVENOX) injection  30 mg  Subcutaneous Q24H  . feeding supplement  1 Container Oral TID BM  . memantine  5 mg Oral Daily  . pantoprazole  40 mg Oral Daily   Continuous: . sodium chloride 125 mL/hr at 03/18/17 1159  .  ceFAZolin (ANCEF) IV     QTM:[id number] **OR** acetaminophen, albuterol, alum & mag hydroxide-simeth, HYDROcodone-acetaminophen, labetalol, menthol-cetylpyridinium **OR** phenol, methocarbamol **OR** [DISCONTINUED] methocarbamol (ROBAXIN)  IV, metoCLOPramide **OR** metoCLOPramide (REGLAN) injection, morphine injection, ondansetron **OR** ondansetron (ZOFRAN) IV, oxyCODONE, polyethylene glycol  Assessment/Plan:  Principal Problem:   Left displaced femoral neck fracture (HCC) Active Problems:   ESRD on dialysis ([place])   Essential hypertension   Moderate dementia   Diabetes with neurologic complications (HCC)    Left hip fracture. Thought to be secondary to mechanical fall. Seen by orthopedics. Please see my progress note from yesterday regarding surgical risk. Patient was thought to be moderate to high risk. This was discussed with the family. They wanted to proceed with surgery. Patient seemed to have tolerated surgery well. He will need to be monitored on telemetry. Pain control.  Patient is at risk for delirium postoperatively.  Moderate aortic stenosis with likely underlying coronary artery disease. He is to be stable postoperatively. Not a candidate for aggressive evaluation and interventions.  End-stage renal disease on hemodialysis. He is dialyzed on Monday, Wednesday, Friday. He has a dialysis catheter in his right chest. Nephrology is following. Dialyzed yesterday.  Advanced dementia. He used to be followed by neurology. Was thought to have moderate dementia about 2 years ago, but apparently this has gotten worse. During his last hospitalization a few months ago he was not responding much either, not answering questions. So, this has progressed. Is on Namenda and Risperdal at  home.  History of Essential hypertension. Monitor blood pressures closely. Apparently not on any home medications for same.  Diabetes mellitus type 2. Apparently not on insulin at home . Monitor CBGs. SSI.  DVT Prophylaxis: To be determined postoperatively    Code Status: Full code. Family Communication: No family at bedside  Disposition Plan: Management as outlined above.    LOS: 1 day   [facility] Pager [phone] 03/18/2017, 12:06 PM  If 7PM-7AM, please contact night-coverage at [email or web address], password [facility]

## 2017-03-18 NOTE — Transfer of Care (Signed)
Immediate Anesthesia Transfer of Care Note  Patient: Steven Forbes  Procedure(s) Performed: Procedure(s): ANTERIOR APPROACH HEMI HIP ARTHROPLASTY LEFT (Left)  Patient Location: PACU  Anesthesia Type:General  Level of Consciousness: awake and alert   Airway & Oxygen Therapy: Patient Spontanous Breathing and Patient connected to nasal cannula oxygen  Post-op Assessment: Report given to RN, Post -op Vital signs reviewed and stable and Patient moving all extremities X 4  Post vital signs: Reviewed and stable  Last Vitals:  Vitals:   03/17/17 1933 03/18/17 0554  BP: (!) 176/76 (!) 145/64  Pulse: 81 95  Resp:  16  Temp: 36.7 C 37.1 C    Last Pain:  Vitals:   03/18/17 0554  TempSrc: Oral  PainSc:       Patients Stated Pain Goal: 0 (55/37/48 2707)  Complications: No apparent anesthesia complications

## 2017-03-19 DIAGNOSIS — D638 Anemia in other chronic diseases classified elsewhere: Secondary | ICD-10-CM

## 2017-03-19 LAB — CBC
HEMATOCRIT: 30.5 % — AB (ref 39.0–52.0)
Hemoglobin: 9.7 g/dL — ABNORMAL LOW (ref 13.0–17.0)
MCH: 27.7 pg (ref 26.0–34.0)
MCHC: 31.8 g/dL (ref 30.0–36.0)
MCV: 87.1 fL (ref 78.0–100.0)
PLATELETS: 182 10*3/uL (ref 150–400)
RBC: 3.5 MIL/uL — AB (ref 4.22–5.81)
RDW: 16.7 % — ABNORMAL HIGH (ref 11.5–15.5)
WBC: 13.7 10*3/uL — ABNORMAL HIGH (ref 4.0–10.5)

## 2017-03-19 LAB — BASIC METABOLIC PANEL
Anion gap: 8 (ref 5–15)
BUN: 11 mg/dL (ref 6–20)
CALCIUM: 7.5 mg/dL — AB (ref 8.9–10.3)
CHLORIDE: 101 mmol/L (ref 101–111)
CO2: 23 mmol/L (ref 22–32)
CREATININE: 3.86 mg/dL — AB (ref 0.61–1.24)
GFR calc non Af Amer: 13 mL/min — ABNORMAL LOW (ref >60)
GFR, EST AFRICAN AMERICAN: 15 mL/min — AB (ref >60)
Glucose, Bld: 156 mg/dL — ABNORMAL HIGH (ref 65–99)
Potassium: 3.2 mmol/L — ABNORMAL LOW (ref 3.5–5.1)
SODIUM: 132 mmol/L — AB (ref 135–145)

## 2017-03-19 LAB — GLUCOSE, CAPILLARY
Glucose-Capillary: 151 mg/dL — ABNORMAL HIGH (ref 65–99)
Glucose-Capillary: 139 mg/dL — ABNORMAL HIGH (ref 65–99)
Glucose-Capillary: 163 mg/dL — ABNORMAL HIGH (ref 65–99)

## 2017-03-19 NOTE — Progress Notes (Signed)
   Subjective:  Patient reports pain as mild.  A lot more alert since surgery.  Family states doesn't seem to be in pain.  Objective:   VITALS:   Vitals:   03/18/17 1110 03/18/17 1948 03/18/17 2339 03/19/17 0400  BP: (!) 144/62 (!) 164/68 140/66 (!) 155/63  Pulse: 80 79 93 80  Resp: 16     Temp: 97.7 F (36.5 C) 98.9 F (37.2 C) 98.5 F (36.9 C) 98 F (36.7 C)  TempSrc: Axillary Oral Oral Oral  SpO2: 100% 100% 100% 100%  Weight:      Height:        Dressing c/d/i Thigh stable Leg lengths equal   Lab Results  Component Value Date   WBC 13.7 (H) 03/19/2017   HGB 9.7 (L) 03/19/2017   HCT 30.5 (L) 03/19/2017   MCV 87.1 03/19/2017   PLT 182 03/19/2017     Assessment/Plan:  1 Day Post-Op   - Expected postop acute blood loss anemia - will monitor for symptoms - Up with PT/OT - DVT ppx - SCDs, ambulation, lovenox - WBAT operative extremity, no hip precautions - Pain control - Discharge planning - SNF pending - stable from ortho stand point  Eduard Roux 03/19/2017, 8:02 AM [phone]

## 2017-03-19 NOTE — Progress Notes (Signed)
Subjective:  In bedside chair / daughter in room no sig pain  Currently / for Hd tomor    Objective Vital signs in last 24 hours: Vitals:   03/18/17 1110 03/18/17 1948 03/18/17 2339 03/19/17 0400  BP: (!) 144/62 (!) 164/68 140/66 (!) 155/63  Pulse: 80 79 93 80  Resp: 16     Temp: 97.7 F (36.5 C) 98.9 F (37.2 C) 98.5 F (36.9 C) 98 F (36.7 C)  TempSrc: Axillary Oral Oral Oral  SpO2: 100% 100% 100% 100%  Weight:      Height:       Weight change:   Physical Exam: General: thin elderly BM  pleasantly confused ( about baseline  [facility] when I see him) Lungs:2/6 SEM no RG Card :RRR 1/6 SEM  No rub or gal Abd: soft ,Nt, ND Ext :toe amps R foot.  No LE edema Dialysis Access:R  IJ [facility]  Dialysis: [place] MWF 4h  400/A1.5  56kg  3K/2.25 bath  P4  Hep 5000 R IJ [facility] (Dr Augustin Coupe 01/16/17) -Hectorol 2 mcg IV TIW -Mircera 75 mcg IV Q 2 weeks (Last dose 03/15/17 Last HGB 10.4 03/15/17)  Problem/Plan: 1. L displaced Femoral Neck Fx: went to OR for repair 12/16/16 S/P prosthetic replacement for femoral neck fracture 2. ESRD - MWF HD. Next HD in Am 3. Hypertension/volume - CXR unremarkable. No evidence of volume overload on exam. Under dry wt a bit. BP's  stable.  4. Anemia - HGB 11.1> 9.7 psot op drop as expected/ fu trend   ESA dose given 00/76/22. 5. Metabolic bone disease - cont binder/VDRA 6. Nutrition - renal diet, renal vit/nepro 7. DM type 2 : per primary  8. HO Mod AOS  9. HO Advanced Dementia- will need NHP for rehab  Ernest Haber, PA-C [facility] Beeper [phone] 03/19/2017,10:41 AM  LOS: 2 days   Pt seen, examined and agree w A/P as above.  Kelly Splinter MD [facility] pager [phone]   03/19/2017, 12:26 PM    Labs: Basic Metabolic Panel:  Recent Labs Lab 03/17/17 1544 03/18/17 0326 03/18/17 1201 03/19/17 0417  NA 135 134*  --  132*  K 2.9* 3.4*  --  3.2*  CL 99* 100*  --  101  CO2 26 28  --  23  GLUCOSE  138* 79  --  156*  BUN 15 5*  --  11  CREATININE 4.57* 2.52* 3.07* 3.86*  CALCIUM 8.0* 7.7*  --  7.5*  PHOS 3.6  --   --   --    Liver Function Tests:  Recent Labs Lab 03/17/17 1544  ALBUMIN 1.7*   No results for input(s): LIPASE, AMYLASE in the last 168 hours. No results for input(s): AMMONIA in the last 168 hours. CBC:  Recent Labs Lab 03/16/17 2317 03/17/17 1544 03/18/17 0326 03/18/17 1201 03/19/17 0417  WBC 14.1* 13.6* 12.4* 14.9* 13.7*  NEUTROABS 12.0*  --   --   --   --   HGB 11.1* 10.2* 10.7* 11.3* 9.7*  HCT 34.7* 31.6* 32.1* 34.3* 30.5*  MCV 88.1 87.3 86.8 86.8 87.1  PLT 141* 145* 169 136* 182   Cardiac Enzymes: No results for input(s): CKTOTAL, CKMB, CKMBINDEX, TROPONINI in the last 168 hours. CBG:  Recent Labs Lab 03/18/17 1025 03/18/17 1112 03/18/17 2003 03/18/17 2342 03/19/17 0506  GLUCAP 90 84 180* 220* 151*    Studies/Results: Pelvis Portable  Result Date: 03/18/2017 CLINICAL DATA:  Left hip surgery.  EXAM: DG C-ARM 61-120 MIN; PORTABLE PELVIS 1-2 VIEWS COMPARISON:  03/16/2017 FINDINGS: Bipolar hip prosthesis in place. The femoral component is well seated. No complicating features. The pubic symphysis is intact. No definite pelvic fractures. IMPRESSION: Bipolar type left hip prosthesis in good position without complicating features. Electronically Signed   By: Marijo Sanes M.D.   On: 03/18/2017 10:19   Dg C-arm 1-60 Min  Result Date: 03/18/2017 CLINICAL DATA:  Left hip surgery. EXAM: DG C-ARM 61-120 MIN; PORTABLE PELVIS 1-2 VIEWS COMPARISON:  03/16/2017 FINDINGS: Bipolar hip prosthesis in place. The femoral component is well seated. No complicating features. The pubic symphysis is intact. No definite pelvic fractures. IMPRESSION: Bipolar type left hip prosthesis in good position without complicating features. Electronically Signed   By: Marijo Sanes M.D.   On: 03/18/2017 10:19   Dg Hip Operative Unilat W Or W/o Pelvis Left  Result Date:  03/18/2017 CLINICAL DATA:  83 year old male undergoing left anterior hip replacement for femoral neck fracture EXAM: OPERATIVE LEFT HIP (WITH PELVIS IF PERFORMED) 2 VIEWS TECHNIQUE: Fluoroscopic spot image(s) were submitted for interpretation post-operatively. COMPARISON:  Preoperative radiographs 03/16/2017 FINDINGS: Intraoperative radiographs demonstrates in progress left hip arthroplasty. No evidence of immediate hardware complication. IMPRESSION: In progress left hip arthroplasty without evidence of complication. Electronically Signed   By: Jacqulynn Cadet M.D.   On: 03/18/2017 10:17   Medications:  . atorvastatin  80 mg Oral Daily  . calcium acetate  667 mg Oral TID WC  . enoxaparin (LOVENOX) injection  30 mg Subcutaneous Q24H  . feeding supplement  1 Container Oral TID BM  . memantine  5 mg Oral Daily  . pantoprazole  40 mg Oral Daily

## 2017-03-19 NOTE — Evaluation (Signed)
Occupational Therapy Evaluation Patient Details Name: Steven Forbes MRN: [id number] DOB: 09-07-1926 Today's Date: 03/19/2017    History of Present Illness Pt is a 83 yo male with c/o of L hip pain after mechanical fall. dx with L femoral neck fx. s/p L femoral neck prosthetic placement, WBAT. PMH significant for ESRD, R transmetatarsal amp of R foot, and dementia.    Clinical Impression   PTA Pt getting assist from wife for bathing/dressing, and supervision for mobility with no DME. Pt is currently max A for ADL and max +2 for transfers (very strong posterior lean). Pt will require skilled OT in the acute setting to maximize safety and independence in ADL and functional transfers. Pt will require SNF level therapy upon dc to return to PLOF. Next session to focus on toilet transfers and seated grooming.    Follow Up Recommendations  SNF;Supervision/Assistance - 24 hour    Equipment Recommendations  Other (comment) (defer to next venue)    Recommendations for Other Services       Precautions / Restrictions Precautions Precautions: Fall Restrictions Weight Bearing Restrictions: Yes LLE Weight Bearing: Weight bearing as tolerated      Mobility Bed Mobility Overal bed mobility: Needs Assistance Bed Mobility: Supine to Sit     Supine to sit: Max assist;+2 for physical assistance     General bed mobility comments: max A for trunk to upright and LE management to floor, vc for anterior lean at EoB to come to sitting requiring modAx1 to support posterior lean, vc for LE movement to EoB and hand placement on bed rail to assist.   Transfers Overall transfer level: Needs assistance Equipment used: 2 person hand held assist Transfers: Stand Pivot Transfers   Stand pivot transfers: Max assist;+2 physical assistance       General transfer comment: Max Ax2 for power up and maintaining upright for pivot to chair, vc for hand placement on PT elbows to assist in steadying, pt limited by  pain and unable to take steps to recliner     Balance Overall balance assessment: Needs assistance Sitting-balance support: Bilateral upper extremity supported;Feet supported Sitting balance-Leahy Scale: Zero Sitting balance - Comments: increased posterior lean requiring mod-maxA from therapy to maintain sitting Postural control: Posterior lean Standing balance support: Bilateral upper extremity supported Standing balance-Leahy Scale: Zero Standing balance comment: requires maxA for maintaining upright                           ADL either performed or assessed with clinical judgement   ADL Overall ADL's : Needs assistance/impaired Eating/Feeding: Set up;Bed level;Sitting   Grooming: Minimal assistance;Sitting Grooming Details (indicate cue type and reason): assist for thoroughness Upper Body Bathing: Maximal assistance   Lower Body Bathing: Total assistance   Upper Body Dressing : Moderate assistance   Lower Body Dressing: Total assistance   Toilet Transfer: Maximal assistance;+2 for physical assistance;+2 for safety/equipment Toilet Transfer Details (indicate cue type and reason): 2 person hand held assist - no RW for safety Toileting- Clothing Manipulation and Hygiene: Total assistance       Functional mobility during ADLs: Maximal assistance;+2 for physical assistance;+2 for safety/equipment;Cueing for safety;Cueing for sequencing       Vision         Perception     Praxis      Pertinent Vitals/Pain Pain Assessment: Faces Faces Pain Scale: Hurts even more Pain Location: L hip with movement  Pain Descriptors / Indicators: Grimacing;Guarding;Moaning Pain Intervention(s):  Monitored during session;Repositioned;RN gave pain meds during session     Hand Dominance Right   Extremity/Trunk Assessment Upper Extremity Assessment Upper Extremity Assessment: Generalized weakness   Lower Extremity Assessment Lower Extremity Assessment: Defer to PT  evaluation LLE Deficits / Details: L prosthetic femoral neck placement s/p L fx, L hip ROM and strength limited by pain, L knee and ankle ROM WFL, strength grossly 3+/5 LLE: Unable to fully assess due to pain LLE Sensation: history of peripheral neuropathy       Communication Communication Communication: HOH   Cognition Arousal/Alertness: Awake/alert Behavior During Therapy: WFL for tasks assessed/performed (became tearful after transferring to chair) Overall Cognitive Status: History of cognitive impairments - at baseline                                 General Comments: daughter present and confirms that his dementia is at a baseline level   General Comments  Pt daughter present during session and able to provide information on prior level of ability, Pt with VSS throughout session.     Exercises     Shoulder Instructions      Home Living Family/patient expects to be discharged to:: Skilled nursing facility                                        Prior Functioning/Environment Level of Independence: Needs assistance  Gait / Transfers Assistance Needed: pt was walking without AD however required physical assist over the last 2 weeks ADL's / Homemaking Assistance Needed: wife assists with sponge bathing and dressing   Comments: Pt mumbling at baseline - can be hard to understand        OT Problem List: Decreased strength;Decreased range of motion;Decreased activity tolerance;Impaired balance (sitting and/or standing);Decreased safety awareness;Decreased knowledge of use of DME or AE;Pain      OT Treatment/Interventions: Self-care/ADL training;Therapeutic exercise;DME and/or AE instruction;Therapeutic activities;Patient/family education;Balance training    OT Goals(Current goals can be found in the care plan section) Acute Rehab OT Goals Patient Stated Goal: none stated ADL Goals Pt Will Perform Grooming: with set-up;sitting Pt Will Transfer  to Toilet: with min assist;stand pivot transfer;bedside commode (with RW) Pt Will Perform Toileting - Clothing Manipulation and hygiene: with min guard assist;sit to/from stand Additional ADL Goal #1: Pt will perform bed mobility at min A level prior to ADL  OT Frequency: Min 2X/week   Barriers to D/C:            Co-evaluation PT/OT/SLP Co-Evaluation/Treatment: Yes Reason for Co-Treatment: Necessary to address cognition/behavior during functional activity;For patient/therapist safety;To address functional/ADL transfers PT goals addressed during session: Mobility/safety with mobility OT goals addressed during session: ADL's and self-care      AM-PAC PT "6 Clicks" Daily Activity     Outcome Measure Help from another person eating meals?: A Little Help from another person taking care of personal grooming?: A Little Help from another person toileting, which includes using toliet, bedpan, or urinal?: A Lot Help from another person bathing (including washing, rinsing, drying)?: A Lot Help from another person to put on and taking off regular upper body clothing?: A Lot Help from another person to put on and taking off regular lower body clothing?: Total 6 Click Score: 13   End of Session Equipment Utilized During Treatment: Gait belt Nurse Communication: Mobility status  Activity Tolerance: Patient tolerated treatment well Patient left: in chair;with call bell/phone within reach;with chair alarm set;with family/visitor present;with nursing/sitter in room  OT Visit Diagnosis: Unsteadiness on feet (R26.81);Other abnormalities of gait and mobility (R26.89);Muscle weakness (generalized) (M62.81);History of falling (Z91.81);Pain Pain - Right/Left: Left Pain - part of body: Hip                Time: 0721-8288 OT Time Calculation (min): 42 min Charges:  OT General Charges $OT Visit: 1 Procedure OT Evaluation $OT Eval Moderate Complexity: 1 Procedure G-Codes:     Hulda Humphrey  OTR/L [place] 03/19/2017, 11:20 AM

## 2017-03-19 NOTE — Progress Notes (Signed)
[facility] PROGRESS NOTE  Steven Forbes JJK:[id number] DOB: [birth year] DOA: 03/16/2017  PCP: Shon Baton, MD  Brief History/Interval Summary: 83 year old African-American male with a past medical history of end-stage renal disease on hemodialysis on Monday, Wednesday, Friday, advanced dementia, history of transmetatarsal amputation of the right foot, presented to the emergency department after he was unable to bear weight on the left leg after having a fall at home. Found to have a left hip fracture. Hospitalized for further management.  Reason for Visit: Left hip fracture  Consultants: Orthopedics. Nephrology.  Procedures:  Transthoracic echocardiogram Study Conclusions  - Left ventricle: The cavity size was normal. Systolic function was   normal. The estimated ejection fraction was in the range of 50%   to 55%. There is hypokinesis of the basal inferior and   inferolateral walls. - Aortic valve: Valve mobility was restricted. There was moderate   stenosis. There was moderate regurgitation. Mean gradient (S): 18   mm Hg. Peak gradient (S): 35 mm Hg. Valve area (VTI): 1.19 cm^2.   Valve area (Vmax): 1.11 cm^2. Valve area (Vmean): 0.99 cm^2. - Aortic root: The aortic root was normal in size. - Mitral valve: Calcified annulus. Mildly thickened leaflets .   There was moderate to severe regurgitation. - Right ventricle: Systolic function was normal. - Tricuspid valve: There was mild regurgitation. - Pulmonary arteries: Systolic pressure was mildly increased. PA   peak pressure: 32 mm Hg (S). - Line: A venous catheter was visualized in the superior vena cava,   with its tip in the right atrium. No abnormal features noted.  Prosthetic replacement for left hip fracture 6/30  Antibiotics: None  Subjective/Interval History: Patient lying on the bed. Smiling. Denies any pain. His daughter is at the bedside.   ROS: Unable to do due to dementia  Objective:  Vital  Signs  Vitals:   03/18/17 1110 03/18/17 1948 03/18/17 2339 03/19/17 0400  BP: (!) 144/62 (!) 164/68 140/66 (!) 155/63  Pulse: 80 79 93 80  Resp: 16     Temp: 97.7 F (36.5 C) 98.9 F (37.2 C) 98.5 F (36.9 C) 98 F (36.7 C)  TempSrc: Axillary Oral Oral Oral  SpO2: 100% 100% 100% 100%  Weight:      Height:        Intake/Output Summary (Last 24 hours) at 03/19/17 0752 Last data filed at 03/18/17 1000  Gross per 24 hour  Intake              225 ml  Output              100 ml  Net              125 ml   Filed Weights   03/16/17 2136 03/17/17 1524 03/17/17 1833  Weight: 54.4 kg (120 lb) 54.5 kg (120 lb 2.4 oz) 54.5 kg (120 lb 2.4 oz)    General appearance: Awake, alert. No distress. Resp: Clear to auscultation bilaterally Cardio: S1, S2 is normal, regular. Systolic murmur appreciated over the aortic area. GI: Abdomen is soft. Nontender. Nondistended. Bowel sounds are present. No masses or organomegaly Extremities: No bruising noted over the left hip area Neurologic: No focal deficits.  Lab Results:  Data Reviewed: I have personally reviewed following labs and imaging studies  CBC:  Recent Labs Lab 03/16/17 2317 03/17/17 1544 03/18/17 0326 03/18/17 1201 03/19/17 0417  WBC 14.1* 13.6* 12.4* 14.9* 13.7*  NEUTROABS 12.0*  --   --   --   --  HGB 11.1* 10.2* 10.7* 11.3* 9.7*  HCT 34.7* 31.6* 32.1* 34.3* 30.5*  MCV 88.1 87.3 86.8 86.8 87.1  PLT 141* 145* 169 136* 161    Basic Metabolic Panel:  Recent Labs Lab 03/16/17 2317 03/17/17 1544 03/18/17 0326 03/18/17 1201 03/19/17 0417  NA 136 135 134*  --  132*  K 3.4* 2.9* 3.4*  --  3.2*  CL 100* 99* 100*  --  101  CO2 26 26 28   --  23  GLUCOSE 128* 138* 79  --  156*  BUN 11 15 5*  --  11  CREATININE 3.70* 4.57* 2.52* 3.07* 3.86*  CALCIUM 8.2* 8.0* 7.7*  --  7.5*  PHOS  --  3.6  --   --   --     GFR: Estimated Creatinine Clearance: 10.2 mL/min (A) (by C-G formula based on SCr of 3.86 mg/dL  (H)).  CBG:  Recent Labs Lab 03/18/17 1025 03/18/17 1112 03/18/17 2003 03/18/17 2342 03/19/17 0506  GLUCAP 90 84 180* 220* 151*    Radiology Studies: Pelvis Portable  Result Date: 03/18/2017 CLINICAL DATA:  Left hip surgery. EXAM: DG C-ARM 61-120 MIN; PORTABLE PELVIS 1-2 VIEWS COMPARISON:  03/16/2017 FINDINGS: Bipolar hip prosthesis in place. The femoral component is well seated. No complicating features. The pubic symphysis is intact. No definite pelvic fractures. IMPRESSION: Bipolar type left hip prosthesis in good position without complicating features. Electronically Signed   By: Marijo Sanes M.D.   On: 03/18/2017 10:19   Dg C-arm 1-60 Min  Result Date: 03/18/2017 CLINICAL DATA:  Left hip surgery. EXAM: DG C-ARM 61-120 MIN; PORTABLE PELVIS 1-2 VIEWS COMPARISON:  03/16/2017 FINDINGS: Bipolar hip prosthesis in place. The femoral component is well seated. No complicating features. The pubic symphysis is intact. No definite pelvic fractures. IMPRESSION: Bipolar type left hip prosthesis in good position without complicating features. Electronically Signed   By: Marijo Sanes M.D.   On: 03/18/2017 10:19   Dg Hip Operative Unilat W Or W/o Pelvis Left  Result Date: 03/18/2017 CLINICAL DATA:  83 year old male undergoing left anterior hip replacement for femoral neck fracture EXAM: OPERATIVE LEFT HIP (WITH PELVIS IF PERFORMED) 2 VIEWS TECHNIQUE: Fluoroscopic spot image(s) were submitted for interpretation post-operatively. COMPARISON:  Preoperative radiographs 03/16/2017 FINDINGS: Intraoperative radiographs demonstrates in progress left hip arthroplasty. No evidence of immediate hardware complication. IMPRESSION: In progress left hip arthroplasty without evidence of complication. Electronically Signed   By: Jacqulynn Cadet M.D.   On: 03/18/2017 10:17     Medications:  Scheduled: . atorvastatin  80 mg Oral Daily  . calcium acetate  667 mg Oral TID WC  . enoxaparin (LOVENOX) injection  30  mg Subcutaneous Q24H  . feeding supplement  1 Container Oral TID BM  . memantine  5 mg Oral Daily  . pantoprazole  40 mg Oral Daily   Continuous:  WRU:[id number] **OR** acetaminophen, albuterol, alum & mag hydroxide-simeth, HYDROcodone-acetaminophen, labetalol, menthol-cetylpyridinium **OR** phenol, methocarbamol **OR** [DISCONTINUED] methocarbamol (ROBAXIN)  IV, metoCLOPramide **OR** metoCLOPramide (REGLAN) injection, morphine injection, ondansetron **OR** ondansetron (ZOFRAN) IV, oxyCODONE, polyethylene glycol  Assessment/Plan:  Principal Problem:   Left displaced femoral neck fracture (HCC) Active Problems:   ESRD on dialysis ([place])   Essential hypertension   Moderate dementia   Diabetes with neurologic complications (HCC)    Left hip fracture status post left hip replacement Thought to be secondary to mechanical fall. Seen by orthopedics. Patient underwent surgery to replace his left hip. Seems to be stable. PT and OT evaluation. Pain control.  Discussed with orthopedics. Lovenox for DVT prophylaxis.  Moderate aortic stenosis with likely underlying coronary artery disease. He is to be stable postoperatively. Not a candidate for aggressive evaluation and interventions.  End-stage renal disease on hemodialysis. He is dialyzed on Monday, Wednesday, Friday. He has a dialysis catheter in his right chest. Nephrology is following. Dialyzed 6/29. Next dialysis tomorrow.  Advanced dementia. He used to be followed by neurology. Was thought to have moderate dementia about 2 years ago, but apparently this has gotten worse. During his last hospitalization a few months ago he was not responding much either, not answering questions. So, this appears to have progressed. Is on Namenda and Risperdal at home.  History of Essential hypertension. Monitor blood pressures closely. Apparently not on any home medications for same.  Diabetes mellitus type 2. Apparently not on insulin at home .  Monitor CBGs. SSI.  Anemia of chronic disease with a component of acute blood loss. Hemoglobin has dropped some. To be anticipated with surgery. Continue to monitor. Transfuse as needed.  DVT Prophylaxis: Lovenox Code Status: Full code. Family Communication: Discussed in detail with patient's daughter was at the bedside. CODE STATUS remains the same. Disposition Plan: Management as outlined above. Will likely need to go to skilled nursing facility.    LOS: 2 days   [place] Pager [phone] 03/19/2017, 7:52 AM  If 7PM-7AM, please contact night-coverage at [email or web address], password [facility]

## 2017-03-19 NOTE — Evaluation (Signed)
Physical Therapy Evaluation Patient Details Name: Steven Forbes MRN: [id number] DOB: 1926/03/11 Today's Date: 03/19/2017   History of Present Illness  Pt is a 83 yo male with c/o of L hip pain after mechanical fall. dx with L femoral neck fx. s/p L femoral neck prosthetic placement, WBAT. PMH significant for ESRD, R transmetatarsal amp of R foot, and dementia.   Clinical Impression  Patient is s/p above surgery resulting in functional limitations due to the deficits listed below (see PT Problem List). Pt is currently maxAx2 for bed mobility and stand pivot transfer to recliner. Pt is limited by L hip pain. Patient will benefit from skilled PT to increase their independence and safety with mobility to allow discharge to the venue listed below.       Follow Up Recommendations SNF    Equipment Recommendations  None recommended by PT    Recommendations for Other Services OT consult     Precautions / Restrictions Precautions Precautions: Fall Restrictions Weight Bearing Restrictions: Yes LLE Weight Bearing: Weight bearing as tolerated      Mobility  Bed Mobility Overal bed mobility: Needs Assistance Bed Mobility: Supine to Sit     Supine to sit: Max assist;+2 for physical assistance     General bed mobility comments: max A for trunk to upright and LE management to floor, vc for anterior lean at EoB to come to sitting requiring modAx1 to support posterior lean, vc for LE movement to EoB and hand placement on bed rail to assist.   Transfers Overall transfer level: Needs assistance Equipment used: 2 person hand held assist Transfers: Stand Pivot Transfers   Stand pivot transfers: Max assist;+2 physical assistance       General transfer comment: Max Ax2 for power up and maintaining upright for pivot to chair, vc for hand placement on PT elbows to assist in steadying, pt limited by pain and unable to take steps to recliner   Ambulation/Gait Ambulation/Gait assistance:  (pain  limiting ability to take steps to chair )               Balance Overall balance assessment: Needs assistance Sitting-balance support: Bilateral upper extremity supported;Feet supported Sitting balance-Leahy Scale: Zero Sitting balance - Comments: increased posterior lean requiring mod-maxA from therapy to maintain sitting Postural control: Posterior lean Standing balance support: Bilateral upper extremity supported Standing balance-Leahy Scale: Zero Standing balance comment: requires maxA for maintaining uprighti                             Pertinent Vitals/Pain Pain Assessment: Faces Faces Pain Scale: Hurts even more Pain Location: L hip with movement  Pain Descriptors / Indicators: Grimacing;Guarding;Moaning Pain Intervention(s): Patient requesting pain meds-RN notified;Premedicated before session;Monitored during session  VSS    Home Living Family/patient expects to be discharged to:: Skilled nursing facility                      Prior Function Level of Independence: Needs assistance   Gait / Transfers Assistance Needed: pt was walking without AD however required physical assist over the last 2 weeks  ADL's / Homemaking Assistance Needed: wife assists with sponge bathing and dressing        Hand Dominance   Dominant Hand: Right    Extremity/Trunk Assessment   Upper Extremity Assessment Upper Extremity Assessment: Defer to OT evaluation    Lower Extremity Assessment Lower Extremity Assessment: LLE deficits/detail LLE Deficits / Details:  L prosthetic femoral neck placement s/p L fx, L hip ROM and strength limited by pain, L knee and ankle ROM WFL, strength grossly 3+/5 LLE: Unable to fully assess due to pain LLE Sensation: history of peripheral neuropathy       Communication   Communication: HOH  Cognition Arousal/Alertness: Awake/alert Behavior During Therapy: WFL for tasks assessed/performed (became tearful after transferring to  chair) Overall Cognitive Status: History of cognitive impairments - at baseline                                 General Comments: daughter present and confirms that his dementia is at a baseline level      General Comments General comments (skin integrity, edema, etc.): Pt daughter present during session and able to provide information on prior level of ability, Pt with VSS throughout session.         Assessment/Plan    PT Assessment Patient needs continued PT services  PT Problem List Decreased range of motion;Decreased activity tolerance;Decreased strength;Decreased balance;Decreased mobility;Decreased knowledge of use of DME;Decreased safety awareness;Decreased cognition;Decreased knowledge of precautions;Impaired sensation;Pain       PT Treatment Interventions DME instruction;Gait training;Functional mobility training;Therapeutic activities;Therapeutic exercise;Balance training;Patient/family education    PT Goals (Current goals can be found in the Care Plan section)  Acute Rehab PT Goals Patient Stated Goal: non PT Goal Formulation: With patient/family Time For Goal Achievement: 04/02/17 Potential to Achieve Goals: Fair    Frequency Min 3X/week        Co-evaluation PT/OT/SLP Co-Evaluation/Treatment: Yes Reason for Co-Treatment: Necessary to address cognition/behavior during functional activity PT goals addressed during session: Mobility/safety with mobility         AM-PAC PT "6 Clicks" Daily Activity  Outcome Measure Difficulty turning over in bed (including adjusting bedclothes, sheets and blankets)?: Total Difficulty moving from lying on back to sitting on the side of the bed? : Total Difficulty sitting down on and standing up from a chair with arms (e.g., wheelchair, bedside commode, etc,.)?: Total Help needed moving to and from a bed to chair (including a wheelchair)?: Total Help needed walking in hospital room?: Total Help needed climbing 3-5  steps with a railing? : Total 6 Click Score: 6    End of Session Equipment Utilized During Treatment: Gait belt Activity Tolerance: Patient limited by pain Patient left: in chair;with call bell/phone within reach;with nursing/sitter in room;with family/visitor present Nurse Communication: Mobility status;Patient requests pain meds PT Visit Diagnosis: Unsteadiness on feet (R26.81);Other abnormalities of gait and mobility (R26.89);History of falling (Z91.81);Difficulty in walking, not elsewhere classified (R26.2);Pain Pain - Right/Left: Left Pain - part of body: Hip    Time: 0912-0951 PT Time Calculation (min) (ACUTE ONLY): 39 min   Charges:   PT Evaluation $PT Eval Low Complexity: 1 Procedure PT Treatments $Therapeutic Activity: 8-22 mins   PT G Codes:        Hjalmer Iovino B. Migdalia Dk PT, DPT Acute Rehabilitation  [phone] Pager [phone]    [place] 03/19/2017, 10:36 AM

## 2017-03-20 ENCOUNTER — Encounter (HOSPITAL_COMMUNITY): Payer: Self-pay | Admitting: Orthopaedic Surgery

## 2017-03-20 DIAGNOSIS — R195 Other fecal abnormalities: Secondary | ICD-10-CM

## 2017-03-20 DIAGNOSIS — D72829 Elevated white blood cell count, unspecified: Secondary | ICD-10-CM

## 2017-03-20 LAB — BASIC METABOLIC PANEL
Anion gap: 9 (ref 5–15)
BUN: 16 mg/dL (ref 6–20)
CHLORIDE: 103 mmol/L (ref 101–111)
CO2: 23 mmol/L (ref 22–32)
CREATININE: 4.81 mg/dL — AB (ref 0.61–1.24)
Calcium: 7.5 mg/dL — ABNORMAL LOW (ref 8.9–10.3)
GFR calc non Af Amer: 10 mL/min — ABNORMAL LOW (ref >60)
GFR, EST AFRICAN AMERICAN: 11 mL/min — AB (ref >60)
Glucose, Bld: 85 mg/dL (ref 65–99)
Potassium: 3 mmol/L — ABNORMAL LOW (ref 3.5–5.1)
Sodium: 135 mmol/L (ref 135–145)

## 2017-03-20 LAB — CBC
HEMATOCRIT: 29.3 % — AB (ref 39.0–52.0)
Hemoglobin: 9.6 g/dL — ABNORMAL LOW (ref 13.0–17.0)
MCH: 28.3 pg (ref 26.0–34.0)
MCHC: 32.8 g/dL (ref 30.0–36.0)
MCV: 86.4 fL (ref 78.0–100.0)
Platelets: 172 10*3/uL (ref 150–400)
RBC: 3.39 MIL/uL — ABNORMAL LOW (ref 4.22–5.81)
RDW: 17 % — ABNORMAL HIGH (ref 11.5–15.5)
WBC: 22.4 10*3/uL — ABNORMAL HIGH (ref 4.0–10.5)

## 2017-03-20 LAB — GLUCOSE, CAPILLARY
GLUCOSE-CAPILLARY: 80 mg/dL (ref 65–99)
Glucose-Capillary: 108 mg/dL — ABNORMAL HIGH (ref 65–99)
Glucose-Capillary: 133 mg/dL — ABNORMAL HIGH (ref 65–99)
Glucose-Capillary: 262 mg/dL — ABNORMAL HIGH (ref 65–99)
Glucose-Capillary: 68 mg/dL (ref 65–99)

## 2017-03-20 MED ORDER — DOXERCALCIFEROL 4 MCG/2ML IV SOLN
2.0000 ug | INTRAVENOUS | Status: DC
  Filled 2017-03-20: qty 2

## 2017-03-20 NOTE — Progress Notes (Signed)
Subjective:  In bed- non verbal- coughing- looked in mouth and has blue mass on roof of mouth- old pills ?? Cannot drink thru straw to dislodge    Objective Vital signs in last 24 hours: Vitals:   03/19/17 0400 03/19/17 1534 03/19/17 2145 03/20/17 0424  BP: (!) 155/63 126/60 (!) 130/49 127/70  Pulse: 80 66 76 84  Resp:  16    Temp: 98 F (36.7 C) 98.4 F (36.9 C) 98.1 F (36.7 C) 98.4 F (36.9 C)  TempSrc: Oral Oral Oral Oral  SpO2: 100% 100% 99% 100%  Weight:      Height:       Weight change:   Physical Exam: General: thin elderly BM  pleasantly confused ( about baseline  MS form [facility] when I see him) Lungs:2/6 SEM no RG Card :RRR 1/6 SEM  No rub or gal Abd: soft ,Nt, ND Ext :toe amps R foot.  No LE edema Dialysis Access:R  IJ TDC- has left AVF with good thrill and bruit   Dialysis: [facility] MWF 4h  400/A1.5  56kg  3K/2.25 bath  P4  Hep 5000 R IJ TDC (Dr Augustin Coupe 01/16/17) -Hectorol 2 mcg IV TIW -Mircera 75 mcg IV Q 2 weeks (Last dose 03/15/17 Last HGB 10.4 03/15/17)  Problem/Plan: 1. L displaced Femoral Neck Fx: went to OR for repair 03/18/17 S/P prosthetic replacement for femoral neck fracture 2. ESRD - MWF HD. Next HD today- has AVF- not sure why not using ?? Using PC 3. Hypertension/volume - CXR unremarkable. No evidence of volume overload on exam. Under dry wt Forbes bit. BP's  Stable- min to mod goal with HD today.  4. Anemia - HGB 11.1> 9.7 psot op drop as expected/ fu trend   ESA dose given 34/19/62. 5. Metabolic bone disease - cont VDRA- hold binder- not really eating and phos 3.6 6. Nutrition - renal diet, renal vit/nepro 7. DM type 2 : per primary  8. HO Mod AOS  9. HO Advanced Dementia- will need NHP for rehab- not sure how well this is going to work- pt very debilitated  Steven Forbes   03/20/2017, 10:10 AM    Labs: Basic Metabolic Panel:  Recent Labs Lab 03/17/17 1544 03/18/17 0326 03/18/17 1201 03/19/17 0417 03/20/17 0423  NA 135  134*  --  132* 135  K 2.9* 3.4*  --  3.2* 3.0*  CL 99* 100*  --  101 103  CO2 26 28  --  23 23  GLUCOSE 138* 79  --  156* 85  BUN 15 5*  --  11 16  CREATININE 4.57* 2.52* 3.07* 3.86* 4.81*  CALCIUM 8.0* 7.7*  --  7.5* 7.5*  PHOS 3.6  --   --   --   --    Liver Function Tests:  Recent Labs Lab 03/17/17 1544  ALBUMIN 1.7*   No results for input(s): LIPASE, AMYLASE in the last 168 hours. No results for input(s): AMMONIA in the last 168 hours. CBC:  Recent Labs Lab 03/16/17 2317 03/17/17 1544 03/18/17 0326 03/18/17 1201 03/19/17 0417 03/20/17 0423  WBC 14.1* 13.6* 12.4* 14.9* 13.7* 22.4*  NEUTROABS 12.0*  --   --   --   --   --   HGB 11.1* 10.2* 10.7* 11.3* 9.7* 9.6*  HCT 34.7* 31.6* 32.1* 34.3* 30.5* 29.3*  MCV 88.1 87.3 86.8 86.8 87.1 86.4  PLT 141* 145* 169 136* 182 172   Cardiac Enzymes: No results for input(s): CKTOTAL, CKMB, CKMBINDEX,  TROPONINI in the last 168 hours. CBG:  Recent Labs Lab 03/19/17 0506 03/19/17 1150 03/19/17 2119 03/20/17 0024 03/20/17 0428  GLUCAP 151* 139* 163* 133* 80    Studies/Results: No results found. Medications:  . atorvastatin  80 mg Oral Daily  . calcium acetate  667 mg Oral TID WC  . enoxaparin (LOVENOX) injection  30 mg Subcutaneous Q24H  . feeding supplement  1 Container Oral TID BM  . memantine  5 mg Oral Daily  . pantoprazole  40 mg Oral Daily

## 2017-03-20 NOTE — Care Management (Addendum)
Case manager spoke with patient, his wife and daughter, Steven Forbes. She asks that she be called at [phone] with bed selections for placement. They want patient to be able to be close to home so [name] can visit frequently and so that he can go to his regular dialysis center on [address]. Case manager gave this information to social worker covering patient today.

## 2017-03-20 NOTE — Progress Notes (Signed)
[facility] PROGRESS NOTE  Steven Forbes FFM:[id number] DOB: 1926-05-11 DOA: 03/16/2017  PCP: Shon Baton, MD  Brief History/Interval Summary: 83 year old African-American male with a past medical history of end-stage renal disease on hemodialysis on Monday, Wednesday, Friday, advanced dementia, history of transmetatarsal amputation of the right foot, presented to the emergency department after he was unable to bear weight on the left leg after having a fall at home. Found to have a left hip fracture. Hospitalized for further management.  Reason for Visit: Left hip fracture  Consultants: Orthopedics. Nephrology.  Procedures:  Transthoracic echocardiogram Study Conclusions  - Left ventricle: The cavity size was normal. Systolic function was   normal. The estimated ejection fraction was in the range of 50%   to 55%. There is hypokinesis of the basal inferior and   inferolateral walls. - Aortic valve: Valve mobility was restricted. There was moderate   stenosis. There was moderate regurgitation. Mean gradient (S): 18   mm Hg. Peak gradient (S): 35 mm Hg. Valve area (VTI): 1.19 cm^2.   Valve area (Vmax): 1.11 cm^2. Valve area (Vmean): 0.99 cm^2. - Aortic root: The aortic root was normal in size. - Mitral valve: Calcified annulus. Mildly thickened leaflets .   There was moderate to severe regurgitation. - Right ventricle: Systolic function was normal. - Tricuspid valve: There was mild regurgitation. - Pulmonary arteries: Systolic pressure was mildly increased. PA   peak pressure: 32 mm Hg (S). - Line: A venous catheter was visualized in the superior vena cava,   with its tip in the right atrium. No abnormal features noted.  Prosthetic replacement for left hip fracture 6/30  Antibiotics: None  Subjective/Interval History: Patient lying on the bed. He denies any complaints. He is confused and distracted, which is his baseline. Smiling. His daughter is at the bedside  ROS:  Unable to do due to dementia  Objective:  Vital Signs  Vitals:   03/19/17 0400 03/19/17 1534 03/19/17 2145 03/20/17 0424  BP: (!) 155/63 126/60 (!) 130/49 127/70  Pulse: 80 66 76 84  Resp:  16    Temp: 98 F (36.7 C) 98.4 F (36.9 C) 98.1 F (36.7 C) 98.4 F (36.9 C)  TempSrc: Oral Oral Oral Oral  SpO2: 100% 100% 99% 100%  Weight:      Height:       No intake or output data in the 24 hours ending 03/20/17 0751 Filed Weights   03/16/17 2136 03/17/17 1524 03/17/17 1833  Weight: 54.4 kg (120 lb) 54.5 kg (120 lb 2.4 oz) 54.5 kg (120 lb 2.4 oz)    General appearance: Awake, alert. Distracted. No distress. Resp: Clear to auscultation bilaterally. Cardio: S1, S2 is normal, regular. Systolic murmur appreciated over the aortic area GI: Abdomen is soft. Nontender, nondistended. Bowel sounds are present. No masses, organomegaly Extremities: No bruising noted over the left hip area Neurologic: No focal deficits.  Lab Results:  Data Reviewed: I have personally reviewed following labs and imaging studies  CBC:  Recent Labs Lab 03/16/17 2317 03/17/17 1544 03/18/17 0326 03/18/17 1201 03/19/17 0417 03/20/17 0423  WBC 14.1* 13.6* 12.4* 14.9* 13.7* 22.4*  NEUTROABS 12.0*  --   --   --   --   --   HGB 11.1* 10.2* 10.7* 11.3* 9.7* 9.6*  HCT 34.7* 31.6* 32.1* 34.3* 30.5* 29.3*  MCV 88.1 87.3 86.8 86.8 87.1 86.4  PLT 141* 145* 169 136* 182 570    Basic Metabolic Panel:  Recent Labs Lab 03/16/17 2317 03/17/17  1544 03/18/17 0326 03/18/17 1201 03/19/17 0417 03/20/17 0423  NA 136 135 134*  --  132* 135  K 3.4* 2.9* 3.4*  --  3.2* 3.0*  CL 100* 99* 100*  --  101 103  CO2 26 26 28   --  23 23  GLUCOSE 128* 138* 79  --  156* 85  BUN 11 15 5*  --  11 16  CREATININE 3.70* 4.57* 2.52* 3.07* 3.86* 4.81*  CALCIUM 8.2* 8.0* 7.7*  --  7.5* 7.5*  PHOS  --  3.6  --   --   --   --     GFR: Estimated Creatinine Clearance: 8.2 mL/min (A) (by C-G formula based on SCr of 4.81 mg/dL  (H)).  CBG:  Recent Labs Lab 03/19/17 0506 03/19/17 1150 03/19/17 2119 03/20/17 0024 03/20/17 0428  GLUCAP 151* 139* 163* 133* 80    Radiology Studies: Pelvis Portable  Result Date: 03/18/2017 CLINICAL DATA:  Left hip surgery. EXAM: DG C-ARM 61-120 MIN; PORTABLE PELVIS 1-2 VIEWS COMPARISON:  03/16/2017 FINDINGS: Bipolar hip prosthesis in place. The femoral component is well seated. No complicating features. The pubic symphysis is intact. No definite pelvic fractures. IMPRESSION: Bipolar type left hip prosthesis in good position without complicating features. Electronically Signed   By: Marijo Sanes M.D.   On: 03/18/2017 10:19   Dg C-arm 1-60 Min  Result Date: 03/18/2017 CLINICAL DATA:  Left hip surgery. EXAM: DG C-ARM 61-120 MIN; PORTABLE PELVIS 1-2 VIEWS COMPARISON:  03/16/2017 FINDINGS: Bipolar hip prosthesis in place. The femoral component is well seated. No complicating features. The pubic symphysis is intact. No definite pelvic fractures. IMPRESSION: Bipolar type left hip prosthesis in good position without complicating features. Electronically Signed   By: Marijo Sanes M.D.   On: 03/18/2017 10:19   Dg Hip Operative Unilat W Or W/o Pelvis Left  Result Date: 03/18/2017 CLINICAL DATA:  83 year old male undergoing left anterior hip replacement for femoral neck fracture EXAM: OPERATIVE LEFT HIP (WITH PELVIS IF PERFORMED) 2 VIEWS TECHNIQUE: Fluoroscopic spot image(s) were submitted for interpretation post-operatively. COMPARISON:  Preoperative radiographs 03/16/2017 FINDINGS: Intraoperative radiographs demonstrates in progress left hip arthroplasty. No evidence of immediate hardware complication. IMPRESSION: In progress left hip arthroplasty without evidence of complication. Electronically Signed   By: Jacqulynn Cadet M.D.   On: 03/18/2017 10:17     Medications:  Scheduled: . atorvastatin  80 mg Oral Daily  . calcium acetate  667 mg Oral TID WC  . enoxaparin (LOVENOX) injection   30 mg Subcutaneous Q24H  . feeding supplement  1 Container Oral TID BM  . memantine  5 mg Oral Daily  . pantoprazole  40 mg Oral Daily   Continuous:  WEX:[id number] **OR** acetaminophen, albuterol, alum & mag hydroxide-simeth, HYDROcodone-acetaminophen, labetalol, menthol-cetylpyridinium **OR** phenol, methocarbamol **OR** [DISCONTINUED] methocarbamol (ROBAXIN)  IV, metoCLOPramide **OR** metoCLOPramide (REGLAN) injection, morphine injection, ondansetron **OR** ondansetron (ZOFRAN) IV, oxyCODONE  Assessment/Plan:  Principal Problem:   Left displaced femoral neck fracture (HCC) Active Problems:   ESRD on dialysis ([place])   Essential hypertension   Moderate dementia   Diabetes with neurologic complications (HCC)    Left hip fracture status post left hip replacement Thought to be secondary to mechanical fall. Seen by orthopedics. Patient underwent surgery to replace his left hip. Seems to be stable. PT and OT evaluation. Pain control. Lovenox for DVT prophylaxis. We will need to go to skilled nursing facility for rehabilitation.  Moderate aortic stenosis with likely underlying coronary artery disease. He is to be  stable postoperatively. Not a candidate for aggressive evaluation and interventions.  Leukocytosis Patient is afebrile. Reason for this sudden increase in WBC is not entirely clear. No diarrhea has been noted, although he does have loose stools at baseline. Repeat labs tomorrow.  Loose Stool Noted to have loose stool this morning. Per daughter, he has 1-2 loose bowel movements every day. Not noted to be on any laxatives. Abdomen is benign. Continue to monitor for now.  End-stage renal disease on hemodialysis. He is dialyzed on Monday, Wednesday, Friday. He has a dialysis catheter in his right chest. Nephrology is following. Dialyzed 6/29. Next dialysis today.  Advanced dementia. He used to be followed by neurology. Was thought to have moderate dementia about 2 years ago,  but apparently this has gotten worse. During his last hospitalization a few months ago he was not responding much either, not answering questions. So, this appears to have progressed. Is on Namenda and Risperdal at home.  History of Essential hypertension. Monitor blood pressures closely. Apparently not on any home medications for same.  Diabetes mellitus type 2. Apparently not on insulin at home. Monitor CBGs. SSI.  Anemia of chronic disease with a component of acute blood loss. Hemoglobin has dropped some. To be anticipated with surgery. Stable this morning. Continue to monitor.   DVT Prophylaxis: Lovenox Code Status: Full code. Family Communication: Discussed in detail with patient's daughter. Disposition Plan: Management as outlined above. Social worker to see to start initiating paperwork for skilled nursing facility placement. Anticipate discharge tomorrow.    LOS: 3 days   [facility] Pager [phone] 03/20/2017, 7:51 AM  If 7PM-7AM, please contact night-coverage at [email or web address], password [facility]

## 2017-03-20 NOTE — Anesthesia Postprocedure Evaluation (Addendum)
Anesthesia Post Note  Patient: Steven Forbes  Procedure(s) Performed: Procedure(s) (LRB): ANTERIOR APPROACH HEMI HIP ARTHROPLASTY LEFT (Left)     Patient location during evaluation: PACU Anesthesia Type: General Level of consciousness: awake and patient cooperative Pain management: pain level controlled Vital Signs Assessment: post-procedure vital signs reviewed and stable Respiratory status: spontaneous breathing, nonlabored ventilation, respiratory function stable and patient connected to nasal cannula oxygen Cardiovascular status: blood pressure returned to baseline and stable Postop Assessment: no signs of nausea or vomiting Anesthetic complications: no    Last Vitals:  Vitals:   03/19/17 2145 03/20/17 0424  BP: (!) 130/49 127/70  Pulse: 76 84  Resp:    Temp: 36.7 C 36.9 C    Last Pain:  Vitals:   03/20/17 0424  TempSrc: Oral  PainSc:                  Adalai Perl

## 2017-03-20 NOTE — NC FL2 (Signed)
[place] LEVEL OF CARE SCREENING TOOL     IDENTIFICATION  Patient Name: Steven Forbes Birthdate: 1926-03-22 Sex: male Admission Date (Current Location): 03/16/2017  [facility] [place] [facility] [place] Number:  Herbalist and Address:  [facility], [place] [address] [place], [place] [address]      Provider Number: [id number]  Attending Physician Name and Address:  Bonnielee Haff, MD  Relative Name and Phone Number:       Current Level of Care: Hospital Recommended Level of Care: [place] Prior Approval Number:    Date Approved/Denied:   PASRR Number: [id number]  Discharge Plan: SNF    Current Diagnoses: Patient Active Problem List   Diagnosis Date Noted  . Left displaced femoral neck fracture ([place]) 03/17/2017  . Ulcer of right foot, limited to breakdown of skin ([place]) 03/16/2017  . Acute encephalopathy   . HCAP (healthcare-associated pneumonia) 01/17/2017  . Diabetes with neurologic complications ([place]) 81/06/3158  . Moderate dementia 01/21/2015  . Aortic stenosis 07/08/2014  . CAD (coronary artery disease) 01/07/2014  . Essential hypertension 01/07/2014  . Hyperlipidemia 01/07/2014  . End stage renal disease ([place]) 12/11/2013  . Protein-calorie malnutrition, severe ([place]) 09/11/2013  . Transaminitis 09/10/2013  . Respiratory distress 09/09/2013  . NSTEMI (non-ST elevated myocardial infarction) ([place]) 09/09/2013  . ESRD on dialysis ([place]) 09/09/2013  . possible Pneumonia 09/09/2013  . Influenza B 09/09/2013  . Acute CHF ([place]) 09/09/2013  . Acute myocardial infarction, subendocardial infarction, initial episode of care ([place]) 09/09/2013  . Secondary hyperparathyroidism ([place]) 09/09/2013  . Atherosclerotic peripheral vascular disease ([place]) 09/09/2013  . GASTRIC POLYP 12/08/2008  . HEMORRHOIDS-INTERNAL 12/08/2008  . DIABETES MELLITUS 11/25/2008  . ANEMIA 11/25/2008  . COLONIC POLYPS, HX OF 11/25/2008    Orientation  RESPIRATION BLADDER Height & Weight     Self  Normal Continent Weight: 120 lb 2.4 oz (54.5 kg) Height:  5\' 3"  (160 cm)  BEHAVIORAL SYMPTOMS/MOOD NEUROLOGICAL BOWEL NUTRITION STATUS      Incontinent Diet (see DC summary)  AMBULATORY STATUS COMMUNICATION OF NEEDS Skin   Extensive Assist Verbally Surgical wounds (located on hip- foam dressing)                       Personal Care Assistance Level of Assistance  Bathing, Dressing Bathing Assistance: Maximum assistance   Dressing Assistance: Maximum assistance     Functional Limitations Info             SPECIAL CARE FACTORS FREQUENCY  PT (By licensed PT), OT (By licensed OT)     PT Frequency: 5/wk OT Frequency: 5/wk            Contractures      Additional Factors Info  Code Status, Allergies Code Status Info: FULL Allergies Info: NKA           Current Medications (03/20/2017):  This is the current hospital active medication list Current Facility-Administered Medications  Medication Dose Route Frequency Provider Last Rate Last Dose  . acetaminophen (TYLENOL) tablet 650 mg  650 mg Oral Q6H PRN Leandrew Koyanagi, MD       Or  . acetaminophen (TYLENOL) suppository 650 mg  650 mg Rectal Q6H PRN Leandrew Koyanagi, MD   650 mg at 03/19/17 0112  . albuterol (PROVENTIL) (2.5 MG/3ML) 0.083% nebulizer solution 3 mL  3 mL Inhalation Q6H PRN Bonnielee Haff, MD      . atorvastatin (LIPITOR) tablet 80 mg  80 mg  Oral Daily Etta Quill, DO   80 mg at 03/20/17 7001  . doxercalciferol (HECTOROL) injection 2 mcg  2 mcg Intravenous Q M,W,F-HD Corliss Parish, MD      . enoxaparin (LOVENOX) injection 30 mg  30 mg Subcutaneous Q24H Leandrew Koyanagi, MD   30 mg at 03/20/17 0902  . feeding supplement (BOOST / RESOURCE BREEZE) liquid 1 Container  1 Container Oral TID BM Bonnielee Haff, MD   1 Container at 03/20/17 [phone]  . HYDROcodone-acetaminophen (NORCO/VICODIN) 5-325 MG per tablet 1-2 tablet  1-2 tablet Oral Q6H PRN Leandrew Koyanagi, MD    1 tablet at 03/18/17 1742  . labetalol (NORMODYNE,TRANDATE) injection 5 mg  5 mg Intravenous Q2H PRN Etta Quill, DO      . memantine [facility]) tablet 5 mg  5 mg Oral Daily Bonnielee Haff, MD   5 mg at 03/20/17 0855  . menthol-cetylpyridinium (CEPACOL) lozenge 3 mg  1 lozenge Oral PRN Leandrew Koyanagi, MD       Or  . phenol (CHLORASEPTIC) mouth spray 1 spray  1 spray Mouth/Throat PRN Leandrew Koyanagi, MD      . methocarbamol (ROBAXIN) tablet 500 mg  500 mg Oral Q6H PRN Leandrew Koyanagi, MD      . metoCLOPramide (REGLAN) tablet 5-10 mg  5-10 mg Oral Q8H PRN Leandrew Koyanagi, MD       Or  . metoCLOPramide (REGLAN) injection 5-10 mg  5-10 mg Intravenous Q8H PRN Leandrew Koyanagi, MD      . morphine 4 MG/ML injection 0.52 mg  0.52 mg Intravenous Q2H PRN Etta Quill, DO   0.52 mg at 03/17/17 1131  . ondansetron (ZOFRAN) tablet 4 mg  4 mg Oral Q6H PRN Leandrew Koyanagi, MD       Or  . ondansetron [facility]) injection 4 mg  4 mg Intravenous Q6H PRN Leandrew Koyanagi, MD      . oxyCODONE (Oxy IR/ROXICODONE) immediate release tablet 5-10 mg  5-10 mg Oral Q4H PRN Leandrew Koyanagi, MD   10 mg at 03/20/17 0854  . pantoprazole (PROTONIX) EC tablet 40 mg  40 mg Oral Daily Jennette Kettle M, DO   40 mg at 03/20/17 4967     Discharge Medications: Please see discharge summary for a list of discharge medications.  Relevant Imaging Results:  Relevant Lab Results:   Additional Information SS#: [id number], pt gets dialysis MWF at [facility], [place] [facility] [place] [facility]

## 2017-03-20 NOTE — Progress Notes (Signed)
PT Cancellation Note  Patient Details Name: Steven Forbes MRN: [id number] DOB: Apr 16, 1926   Cancelled Treatment:    Reason Eval/Treat Not Completed: (P) Patient at procedure or test/unavailable (Pt off unit for hemodialysis will f/u per POC.  )   Kacy Hegna Eli Hose 03/20/2017, 2:41 PM  Governor [place]ooks, PTA pager [phone]

## 2017-03-20 NOTE — Progress Notes (Signed)
Patient stable from ortho stand point  N. Eduard Roux, MD [place] 7:32 AM

## 2017-03-21 ENCOUNTER — Inpatient Hospital Stay (HOSPITAL_COMMUNITY): Payer: Medicare Other

## 2017-03-21 ENCOUNTER — Encounter (HOSPITAL_COMMUNITY): Payer: Self-pay | Admitting: General Practice

## 2017-03-21 LAB — BASIC METABOLIC PANEL
Anion gap: 7 (ref 5–15)
BUN: 7 mg/dL (ref 6–20)
CO2: 27 mmol/L (ref 22–32)
Calcium: 7.3 mg/dL — ABNORMAL LOW (ref 8.9–10.3)
Chloride: 100 mmol/L — ABNORMAL LOW (ref 101–111)
Creatinine, Ser: 2.8 mg/dL — ABNORMAL HIGH (ref 0.61–1.24)
GFR calc Af Amer: 22 mL/min — ABNORMAL LOW (ref >60)
GFR, EST NON AFRICAN AMERICAN: 19 mL/min — AB (ref >60)
GLUCOSE: 112 mg/dL — AB (ref 65–99)
POTASSIUM: 3.6 mmol/L (ref 3.5–5.1)
Sodium: 134 mmol/L — ABNORMAL LOW (ref 135–145)

## 2017-03-21 LAB — GLUCOSE, CAPILLARY
Glucose-Capillary: 156 mg/dL — ABNORMAL HIGH (ref 65–99)
Glucose-Capillary: 196 mg/dL — ABNORMAL HIGH (ref 65–99)
Glucose-Capillary: 77 mg/dL (ref 65–99)
Glucose-Capillary: 99 mg/dL (ref 65–99)

## 2017-03-21 LAB — CBC
HEMATOCRIT: 29 % — AB (ref 39.0–52.0)
Hemoglobin: 9.5 g/dL — ABNORMAL LOW (ref 13.0–17.0)
MCH: 28.7 pg (ref 26.0–34.0)
MCHC: 32.8 g/dL (ref 30.0–36.0)
MCV: 87.6 fL (ref 78.0–100.0)
PLATELETS: 175 10*3/uL (ref 150–400)
RBC: 3.31 MIL/uL — AB (ref 4.22–5.81)
RDW: 17.8 % — AB (ref 11.5–15.5)
WBC: 21.2 10*3/uL — ABNORMAL HIGH (ref 4.0–10.5)

## 2017-03-21 MED ORDER — DOXERCALCIFEROL 4 MCG/2ML IV SOLN: 2.0000 ug | INTRAVENOUS | Status: AC

## 2017-03-21 MED ORDER — ACETAMINOPHEN 325 MG PO TABS: 650.0000 mg | ORAL_TABLET | Freq: Four times a day (QID) | ORAL | Status: AC | PRN

## 2017-03-21 MED ORDER — METHOCARBAMOL 500 MG PO TABS: 500.0000 mg | ORAL_TABLET | Freq: Four times a day (QID) | ORAL | 0 refills | Status: AC | PRN

## 2017-03-21 MED ORDER — BOOST / RESOURCE BREEZE PO LIQD: 1.0000 | Freq: Three times a day (TID) | ORAL | 0 refills | Status: AC

## 2017-03-21 NOTE — Care Management Important Message (Signed)
Important Message  Patient Details  Name: Steven Forbes MRN: [id number] Date of Birth: 15-Jan-1926   Medicare Important Message Given:  Yes    Yousuf Ager Montine Circle 03/21/2017, 12:55 PM

## 2017-03-21 NOTE — Clinical Social Work Note (Signed)
Clinical Social Work Assessment  Patient Details  Name: Steven Forbes MRN: [id number] Date of Birth: 17-Aug-1926  Date of referral:  03/21/17               Reason for consult:  Facility Placement                Permission sought to share information with:  Facility Art therapist granted to share information::  Yes, Verbal Permission Granted  Name::     daughter  Agency::  SNF  Relationship::     Contact Information:     Housing/Transportation Living arrangements for the past 2 months:  Single Family Home Source of Information:  Adult Children Patient Interpreter Needed:  None Criminal Activity/Legal Involvement Pertinent to Current Situation/Hospitalization:  No - Comment as needed Significant Relationships:  Adult Children, Spouse, Other Family Members Lives with:  Spouse Do you feel safe going back to the place where you live?  No Need for family participation in patient care:  Yes (Comment)  Care giving concerns:  Patient resided with spouse at home prior to hospitalization. Patient not safe to return home at this time.  Social Worker assessment / plan:  CSW met with daughter and patient at bedside. CSW explained her role. CSW discussed SNF options/placement. CSW had permission to send out offers. Family wants SNF that is close to home so spouse can visit regularly.  FL2 complete. Passr obtained. Offers sent out  Employment status:  Retired Nurse, adult PT Recommendations:  [place] / Referral to community resources:  [place]  Patient/Family's Response to care:  Psychologist, prison and probation services of CSW discussing SNF offers. No issues with care.  Patient/Family's Understanding of and Emotional Response to Diagnosis, Current Treatment, and Prognosis:  Patient/Family has good understanding of diagnosis, current treatment, and prognosis. They are hopeful that rehabilitation will address  physical impairment. No issues or concerns at this time.  Emotional Assessment Appearance:  Appears stated age Attitude/Demeanor/Rapport:  Unresponsive Affect (typically observed):  Flat Orientation:  Oriented to Self Alcohol / Substance use:  Not Applicable Psych involvement (Current and /or in the community):  No (Comment)  Discharge Needs  Concerns to be addressed:  Care Coordination Readmission within the last 30 days:  No Current discharge risk:  Dependent with Mobility, Physical Impairment Barriers to Discharge:  No Barriers Identified   Normajean Baxter, LCSW 03/21/2017, 10:01 AM

## 2017-03-21 NOTE — Social Work (Signed)
Clinical Social Worker facilitated patient discharge including contacting patient family and facility to confirm patient discharge plans.  Clinical information faxed to facility and family agreeable with plan.    CSW arranged ambulance transport via [facility] to [facility].    RN to call [phone] to give report prior to discharge. Patient going to Room 128b.  Clinical Social Worker will sign off for now as social work intervention is no longer needed. Please consult [place] again if new need arises.  Elissa Hefty, LCSW Clinical Social Worker [phone]

## 2017-03-21 NOTE — Progress Notes (Signed)
[facility] Progress Note   Dialysis Orders: [facility] MWF 4h 400/A1.5 56kg 3K/2.25 bath P4 Hep 5000 R IJ TDC (Dr [facility] Forbes 01/16/17) -[facility] 2 mcg IV TIW -[facility] 75 mcg IV Q 2 weeks (Last dose 03/15/17 Last HGB 10.4 03/15/17)  Assessment/Plan: 1. S/p prosthetic replacement for fem neck fx 6/30 - slow recovery 2. ESRD -MWF K 3.6 - intake poor - had been on 3 K bath - change to 4 K at d/c due to poor intake - with weekly K 3. Anemia - hgb 9.5 - post op drop - stable- due for ESA redose next week 4. Secondary hyperparathyroidism - holding binder - cont VDRA 5. BP/volume - BP ok, no volume excess - at prior edw 6. Nutrition - losing weight, not eating FTT overall alb 1.7 prior to surgery 7. Leukocytosis WBC 21.2 K - ^ past two days, + cough, CXR neg today - ^ risk of aspiration/afebrile - also has TDC 8. Dementia - advanced - contributing to decline in functional status 9..Goals of care - if patient does not perk up , need to consider palliative care consult/family meeting with dialysis physician 10. Disp - to return to SNF today  Steven Jacobson, PA-C [facility] [phone] 03/21/2017,10:19 AM  LOS: 4 days    Patient seen and examined, agree with above note with above modifications. Pt not really responsive- plan is for him to go back to SNF.  I feel he will do poorly unfortunately- suspect he will either die at SNF or be back to hosp  - palliative care would be an appropriate option for him - family not really seeing it Steven Parish, MD 03/21/2017     Subjective:   Will not open eyes when I talk to him. Did not eat breakfast  Objective Vitals:   03/20/17 1600 03/20/17 1625 03/20/17 2211 03/21/17 0656  BP: (!) 98/50 (!) 103/52 111/61 126/60  Pulse: 95 91 93 74  Resp: 18 18 16 16   Temp:  98.2 F (36.8 C) 98.9 F (37.2 C) 98.7 F (37.1 C)  TempSrc:  Oral Axillary Axillary  SpO2:  100% 98% 98%  Weight:  56.4 kg (124 lb 5.4 oz)     Height:       Physical Exam General:seems comfortable Heart: RRR Lungs: no rales  Abdomen: soft NT Extremities: no LE edema Dialysis Access:  Right IJ [facility]   Additional Objective Labs: Basic Metabolic Panel:  Recent Labs Lab 03/17/17 1544  03/19/17 0417 03/20/17 0423 03/21/17 0604  NA 135  < > 132* 135 134*  K 2.9*  < > 3.2* 3.0* 3.6  CL 99*  < > 101 103 100*  CO2 26  < > 23 23 27   GLUCOSE 138*  < > 156* 85 112*  BUN 15  < > 11 16 7   CREATININE 4.57*  < > 3.86* 4.81* 2.80*  CALCIUM 8.0*  < > 7.5* 7.5* 7.3*  PHOS 3.6  --   --   --   --   < > = values in this interval not displayed. Liver Function Tests:  Recent Labs Lab 03/17/17 1544  ALBUMIN 1.7*   No results for input(s): LIPASE, AMYLASE in the last 168 hours. CBC:  Recent Labs Lab 03/16/17 2317  03/18/17 0326 03/18/17 1201 03/19/17 0417 03/20/17 0423 03/21/17 0604  WBC 14.1*  < > 12.4* 14.9* 13.7* 22.4* 21.2*  NEUTROABS 12.0*  --   --   --   --   --   --  HGB 11.1*  < > 10.7* 11.3* 9.7* 9.6* 9.5*  HCT 34.7*  < > 32.1* 34.3* 30.5* 29.3* 29.0*  MCV 88.1  < > 86.8 86.8 87.1 86.4 87.6  PLT 141*  < > 169 136* 182 172 175  < > = values in this interval not displayed. Blood Culture    Component Value Date/Time   SDES BLOOD RIGHT ARM 09/09/2013 0045   SPECREQUEST BOTTLES DRAWN AEROBIC AND ANAEROBIC 5CC EACH 09/09/2013 0045   CULT  09/09/2013 0045    NO GROWTH 5 DAYS Performed at [facility] 09/15/2013 FINAL 09/09/2013 0045    Cardiac Enzymes: No results for input(s): CKTOTAL, CKMB, CKMBINDEX, TROPONINI in the last 168 hours. CBG:  Recent Labs Lab 03/20/17 1707 03/20/17 2211 03/21/17 0004 03/21/17 0237 03/21/17 0655  GLUCAP 68 262* 196* 156* 99   Iron Studies: No results for input(s): IRON, TIBC, TRANSFERRIN, FERRITIN in the last 72 hours. Lab Results  Component Value Date   INR 1.24 09/09/2013   INR 1.01 07/19/2011   INR 0.96 05/26/2011   Studies/Results: Dg  Chest Port 1 View  Result Date: 03/21/2017 CLINICAL DATA:  Persistent cough for several days, history of pneumonia, hypertension, end- stage renal disease on hemodialysis, asthma, type II diabetes mellitus EXAM: PORTABLE CHEST 1 VIEW COMPARISON:  Portable exam 0748 hours compared to 03/16/2017 FINDINGS: RIGHT jugular dual-lumen central venous catheter with tip projecting over RIGHT atrium. Normal heart size. Calcified and elongated thoracic aorta. Prominent RIGHT hilum likely due to enlarged central pulmonary arteries, unchanged since 2014. Bibasilar atelectasis. No acute infiltrate, pleural effusion or pneumothorax. Bones unremarkable. IMPRESSION: Bibasilar atelectasis. Enlarged central pulmonary arteries, chronic, question pulmonary artery hypertension. Aortic Atherosclerosis (ICD10-I70.0). Electronically Signed   By: [facility] Forbes M.D.   On: 03/21/2017 08:09   Medications:  . atorvastatin  80 mg Oral Daily  . doxercalciferol  2 mcg Intravenous Q M,W,F-HD  . enoxaparin (LOVENOX) injection  30 mg Subcutaneous Q24H  . feeding supplement  1 Container Oral TID BM  . memantine  5 mg Oral Daily  . pantoprazole  40 mg Oral Daily

## 2017-03-21 NOTE — Progress Notes (Signed)
Nutrition Follow-up  DOCUMENTATION CODES:   Severe malnutrition in context of chronic illness  INTERVENTION:  1. Continue Boost Breeze po TID, each supplement provides 250 kcal and 9 grams of protein  NUTRITION DIAGNOSIS:   Malnutrition (Severe) related to chronic illness (ESRD on HD, dementia) as evidenced by percent weight loss, energy intake < 75% for > or equal to 1 month, severe depletion of body fat, moderate depletion of body fat, moderate depletions of muscle mass, severe depletion of muscle mass. -ongoing  GOAL:   Patient will meet greater than or equal to 90% of their needs -progressing  MONITOR:   PO intake, Supplement acceptance, Diet advancement, Labs, Weight trends, Skin, I & O's  ASSESSMENT:   Steven Forbes is a 83 y.o. male with medical history significant of ESRD dialysis MWF, advanced dementia, recent transmetatarsal amputation of R foot.  Patient had fall at home today.  Has been unable to bear weight on L hip since fall.  Pain better at rest, worse with movement.  Patient continues to be selective with PO intake, food choices.  Ate 25% of chicken and mashed potatoes for lunch.  Consuming boost breeze that is being provided, patient likes. Discharging today. Labs and medications reviewed: Na 134 Pantoprazole, Hectorol  Diet Order:  DIET - DYS 1 Room service appropriate? Yes; Fluid consistency: Thin  Skin:  Reviewed, no issues  Last BM:  PTA  Height:   Ht Readings from Last 1 Encounters:  03/16/17 5\' 3"  (1.6 m)    Weight:   Wt Readings from Last 1 Encounters:  03/20/17 124 lb 5.4 oz (56.4 kg)    Ideal Body Weight:  56.4 kg  BMI:  Body mass index is 22.03 kg/m.  Estimated Nutritional Needs:   Kcal:  1400-1600  Protein:  70-85 grams  Fluid:  per MD  EDUCATION NEEDS:   Education needs addressed  Satira Anis. Steven Thelander, MS, RD LDN Inpatient Clinical Dietitian Pager [phone]

## 2017-03-21 NOTE — Social Work (Signed)
CSW met with family to discuss offers. Family needed time to review. CSW will f/u as patient being dc today.

## 2017-03-21 NOTE — Discharge Summary (Signed)
[facility]  Physician Discharge Summary   Patient ID: Steven Forbes MRN: [id number] DOB/AGE: 12/04/1925 83 y.o.  Admit date: 03/16/2017 Discharge date: 03/21/2017  PCP: Shon Baton, MD  DISCHARGE DIAGNOSES:  Principal Problem:   Left displaced femoral neck fracture ([place]) Active Problems:   ESRD on dialysis [facility])   Essential hypertension   Moderate dementia   Diabetes with neurologic complications ([place])   RECOMMENDATIONS FOR OUTPATIENT FOLLOW UP: 1. CBC and basic metabolic panel early next week 2. Continue with usual hemodialysis schedule of Monday, Wednesday and Friday 3. Consider palliative medicine consult if patient does not progress 4. Monitor CBGs twice a day   DISCHARGE CONDITION: fair  Diet recommendation: Dysphagia 1 Diet  Filed Weights   03/17/17 1833 03/20/17 1219 03/20/17 1625  Weight: 54.5 kg (120 lb 2.4 oz) 56.6 kg (124 lb 12.5 oz) 56.4 kg (124 lb 5.4 oz)    INITIAL HISTORY: 83 year old African-American male with a past medical history of end-stage renal disease on hemodialysis on Monday, Wednesday, Friday, advanced dementia, history of transmetatarsal amputation of the right foot, presented to the emergency department after he was unable to bear weight on the left leg after having a fall at home. Found to have a left hip fracture. Hospitalized for further management.  Consultants: Orthopedics. Nephrology.  Procedures:  Transthoracic echocardiogram Study Conclusions  - Left ventricle: The cavity size was normal. Systolic function was normal. The estimated ejection fraction was in the range of 50% to 55%. There is hypokinesis of the basal inferior and inferolateral walls. - Aortic valve: Valve mobility was restricted. There was moderate stenosis. There was moderate regurgitation. Mean gradient (S): 18 mm Hg. Peak gradient (S): 35 mm Hg. Valve area (VTI): 1.19 cm^2. Valve area (Vmax): 1.11 cm^2. Valve area (Vmean): 0.99 cm^2. -  Aortic root: The aortic root was normal in size. - Mitral valve: Calcified annulus. Mildly thickened leaflets . There was moderate to severe regurgitation. - Right ventricle: Systolic function was normal. - Tricuspid valve: There was mild regurgitation. - Pulmonary arteries: Systolic pressure was mildly increased. PA peak pressure: 32 mm Hg (S). - Line: A venous catheter was visualized in the superior vena cava, with its tip in the right atrium. No abnormal features noted.  Prosthetic replacement for left hip fracture 6/30  Hemodialysis   HOSPITAL COURSE:   Left hip fracture status post left hip replacement Thought to be secondary to mechanical fall. Seen by orthopedics. Patient underwent surgery to replace his left hip. Patient was stable postoperatively. Patient seen by physical and occupational therapy. Skilled nursing facility for rehabilitation as recommended. Lovenox for DVT prophylaxis. Pain control.  Moderate aortic stenosis with likely underlying coronary artery disease. He is to be stable postoperatively. Not a candidate for aggressive evaluation and interventions.  Leukocytosis Patient is afebrile. Reason for this increase in WBC is not entirely clear. No diarrhea has been noted, although he does have loose stools at baseline. CBC slightly better today. He remains afebrile. Chest x-ray was done as the patient's daughter mentioned that he had been coughing. It shows only by basilar atelectasis. WBC could be elevated due to postsurgical stress. Will recommend repeating his blood work early next week.  Loose Stool Patient has 1-2 loose stools every day per his daughter. No increase in frequency has been noted. No watery diarrhea. Abdomen is benign. Monitor while he is on narcotic pain medications.  End-stage renal disease on hemodialysis. He is dialyzed on Monday, Wednesday, Friday. He has a dialysis catheter in his  right chest. Nephrology was following in the  hospital. He was dialyzed as per usual schedule. He may resume his outpatient dialysis schedule.   Advanced dementia. He used to be followed by neurology. Was thought to have moderate dementia about 2 years ago, but apparently this has gotten worse. During his last hospitalization a few months ago he was not responding much either, not answering questions. So, this appears to have progressed. Is on Namenda and Risperdal. Change diet to dysphagia 1 based on previous speech therapy evaluation.  History of Essential hypertension. Monitor blood pressures closely. Apparently not on any home medications for same.  Diabetes mellitus type 2. Monitor CBGs. Not on any medications at home.  Anemia of chronic disease with a component of acute blood loss. Hemoglobin did decrease due to surgery. But stable. No evidence for overt bleeding.   Overall, stable. Okay for discharge to skilled nursing facility today.   PERTINENT LABS:  The results of significant diagnostics from this hospitalization (including imaging, microbiology, ancillary and laboratory) are listed below for reference.    Microbiology: Recent Results (from the past 240 hour(s))  Surgical pcr screen     Status: None   Collection Time: 03/18/17  1:18 AM  Result Value Ref Range Status   MRSA, PCR NEGATIVE NEGATIVE Final   Staphylococcus aureus NEGATIVE NEGATIVE Final    Comment:        The Xpert SA Assay (FDA approved for NASAL specimens in patients over 81 years of age), is one component of a comprehensive surveillance program.  Test performance has been validated by [facility] for patients greater than or equal to 81 year old. It is not intended to diagnose infection nor to guide or monitor treatment.      Labs: Basic Metabolic Panel:  Recent Labs Lab 03/17/17 1544 03/18/17 0326 03/18/17 1201 03/19/17 0417 03/20/17 0423 03/21/17 0604  NA 135 134*  --  132* 135 134*  K 2.9* 3.4*  --  3.2* 3.0* 3.6  CL 99*  100*  --  101 103 100*  CO2 26 28  --  23 23 27   GLUCOSE 138* 79  --  156* 85 112*  BUN 15 5*  --  11 16 7   CREATININE 4.57* 2.52* 3.07* 3.86* 4.81* 2.80*  CALCIUM 8.0* 7.7*  --  7.5* 7.5* 7.3*  PHOS 3.6  --   --   --   --   --    Liver Function Tests:  Recent Labs Lab 03/17/17 1544  ALBUMIN 1.7*   CBC:  Recent Labs Lab 03/16/17 2317  03/18/17 0326 03/18/17 1201 03/19/17 0417 03/20/17 0423 03/21/17 0604  WBC 14.1*  < > 12.4* 14.9* 13.7* 22.4* 21.2*  NEUTROABS 12.0*  --   --   --   --   --   --   HGB 11.1*  < > 10.7* 11.3* 9.7* 9.6* 9.5*  HCT 34.7*  < > 32.1* 34.3* 30.5* 29.3* 29.0*  MCV 88.1  < > 86.8 86.8 87.1 86.4 87.6  PLT 141*  < > 169 136* 182 172 175  < > = values in this interval not displayed.  CBG:  Recent Labs Lab 03/20/17 1707 03/20/17 2211 03/21/17 0004 03/21/17 0237 03/21/17 0655  GLUCAP 68 262* 196* 156* 99     IMAGING STUDIES Dg Chest 1 View  Result Date: 03/16/2017 CLINICAL DATA:  Decreased mobility after an unwitnessed fall at home. EXAM: CHEST 1 VIEW COMPARISON:  01/17/2017 FINDINGS: Right jugular dual-lumen catheter extends into  the right atrium. Mild cardiomegaly, unchanged. The lungs are clear. The pulmonary vasculature is normal. No pleural effusion. IMPRESSION: Stable cardiomegaly.  No acute findings. Electronically Signed   By: Andreas Newport M.D.   On: 03/16/2017 22:50   Ct Head Wo Contrast  Result Date: 03/16/2017 CLINICAL DATA:  83 year old male with under witnessed fall at home. Altered mental status. EXAM: CT HEAD WITHOUT CONTRAST CT CERVICAL SPINE WITHOUT CONTRAST TECHNIQUE: Multidetector CT imaging of the head and cervical spine was performed following the standard protocol without intravenous contrast. Multiplanar CT image reconstructions of the cervical spine were also generated. COMPARISON:  01/17/2017 and prior head CTs FINDINGS: CT HEAD FINDINGS Brain: No evidence of acute infarction, hemorrhage, hydrocephalus, extra-axial  collection or mass lesion/mass effect. Atrophy and chronic small-vessel white matter ischemic changes noted. Vascular: Intracranial atherosclerotic calcifications noted. Skull: Normal. Negative for fracture or focal lesion. Sinuses/Orbits: No acute finding. Other: None. CT CERVICAL SPINE FINDINGS Alignment: 2 mm anterolisthesis of C4 on C5 is degenerative. No acute subluxation noted. Skull base and vertebrae: No acute fracture. No primary bone lesion or focal pathologic process. Soft tissues and spinal canal: No prevertebral fluid or swelling. No visible canal hematoma. Disc levels:  Severe degenerative disc disease from C4-C7 noted. Upper chest: Negative. Other: None IMPRESSION: No evidence of acute intracranial abnormality. Atrophy and chronic small-vessel white matter ischemic changes. No static evidence of acute injury to the cervical spine. Severe degenerative disc disease from C4-C7. Electronically Signed   By: Margarette Canada M.D.   On: 03/16/2017 23:18   Ct Cervical Spine Wo Contrast  Result Date: 03/16/2017 CLINICAL DATA:  83 year old male with under witnessed fall at home. Altered mental status. EXAM: CT HEAD WITHOUT CONTRAST CT CERVICAL SPINE WITHOUT CONTRAST TECHNIQUE: Multidetector CT imaging of the head and cervical spine was performed following the standard protocol without intravenous contrast. Multiplanar CT image reconstructions of the cervical spine were also generated. COMPARISON:  01/17/2017 and prior head CTs FINDINGS: CT HEAD FINDINGS Brain: No evidence of acute infarction, hemorrhage, hydrocephalus, extra-axial collection or mass lesion/mass effect. Atrophy and chronic small-vessel white matter ischemic changes noted. Vascular: Intracranial atherosclerotic calcifications noted. Skull: Normal. Negative for fracture or focal lesion. Sinuses/Orbits: No acute finding. Other: None. CT CERVICAL SPINE FINDINGS Alignment: 2 mm anterolisthesis of C4 on C5 is degenerative. No acute subluxation noted.  Skull base and vertebrae: No acute fracture. No primary bone lesion or focal pathologic process. Soft tissues and spinal canal: No prevertebral fluid or swelling. No visible canal hematoma. Disc levels:  Severe degenerative disc disease from C4-C7 noted. Upper chest: Negative. Other: None IMPRESSION: No evidence of acute intracranial abnormality. Atrophy and chronic small-vessel white matter ischemic changes. No static evidence of acute injury to the cervical spine. Severe degenerative disc disease from C4-C7. Electronically Signed   By: Margarette Canada M.D.   On: 03/16/2017 23:18   Pelvis Portable  Result Date: 03/18/2017 CLINICAL DATA:  Left hip surgery. EXAM: DG C-ARM 61-120 MIN; PORTABLE PELVIS 1-2 VIEWS COMPARISON:  03/16/2017 FINDINGS: Bipolar hip prosthesis in place. The femoral component is well seated. No complicating features. The pubic symphysis is intact. No definite pelvic fractures. IMPRESSION: Bipolar type left hip prosthesis in good position without complicating features. Electronically Signed   By: Marijo Sanes M.D.   On: 03/18/2017 10:19   Dg Chest Port 1 View  Result Date: 03/21/2017 CLINICAL DATA:  Persistent cough for several days, history of pneumonia, hypertension, end- stage renal disease on hemodialysis, asthma, type II diabetes mellitus  EXAM: PORTABLE CHEST 1 VIEW COMPARISON:  Portable exam 0748 hours compared to 03/16/2017 FINDINGS: RIGHT jugular dual-lumen central venous catheter with tip projecting over RIGHT atrium. Normal heart size. Calcified and elongated thoracic aorta. Prominent RIGHT hilum likely due to enlarged central pulmonary arteries, unchanged since 2014. Bibasilar atelectasis. No acute infiltrate, pleural effusion or pneumothorax. Bones unremarkable. IMPRESSION: Bibasilar atelectasis. Enlarged central pulmonary arteries, chronic, question pulmonary artery hypertension. Aortic Atherosclerosis (ICD10-I70.0). Electronically Signed   By: Lavonia Dana M.D.   On: 03/21/2017  08:09   Dg C-arm 1-60 Min  Result Date: 03/18/2017 CLINICAL DATA:  Left hip surgery. EXAM: DG C-ARM 61-120 MIN; PORTABLE PELVIS 1-2 VIEWS COMPARISON:  03/16/2017 FINDINGS: Bipolar hip prosthesis in place. The femoral component is well seated. No complicating features. The pubic symphysis is intact. No definite pelvic fractures. IMPRESSION: Bipolar type left hip prosthesis in good position without complicating features. Electronically Signed   By: Marijo Sanes M.D.   On: 03/18/2017 10:19   Dg Hip Operative Unilat W Or W/o Pelvis Left  Result Date: 03/18/2017 CLINICAL DATA:  83 year old male undergoing left anterior hip replacement for femoral neck fracture EXAM: OPERATIVE LEFT HIP (WITH PELVIS IF PERFORMED) 2 VIEWS TECHNIQUE: Fluoroscopic spot image(s) were submitted for interpretation post-operatively. COMPARISON:  Preoperative radiographs 03/16/2017 FINDINGS: Intraoperative radiographs demonstrates in progress left hip arthroplasty. No evidence of immediate hardware complication. IMPRESSION: In progress left hip arthroplasty without evidence of complication. Electronically Signed   By: Jacqulynn Cadet M.D.   On: 03/18/2017 10:17   Dg Hip Unilat W Or W/o Pelvis Min 4 Views Left  Result Date: 03/16/2017 CLINICAL DATA:  Fall EXAM: DG HIP (WITH OR WITHOUT PELVIS) 4+V LEFT COMPARISON:  None. FINDINGS: There is a transverse fracture of the left femoral neck with approximately 2 cm of overriding. The femoral head remains situated in the acetabulum. No pelvic diastasis. No acute abnormality of the right hip. IMPRESSION: Left femoral neck transverse fracture Electronically Signed   By: Ulyses Jarred M.D.   On: 03/16/2017 22:50    DISCHARGE EXAMINATION: Vitals:   03/20/17 1600 03/20/17 1625 03/20/17 2211 03/21/17 0656  BP: (!) 98/50 (!) 103/52 111/61 126/60  Pulse: 95 91 93 74  Resp: 18 18 16 16   Temp:  98.2 F (36.8 C) 98.9 F (37.2 C) 98.7 F (37.1 C)  TempSrc:  Oral Axillary Axillary  SpO2:   100% 98% 98%  Weight:  56.4 kg (124 lb 5.4 oz)    Height:       General appearance: alert, appears stated age, distracted and no distress Resp: clear to auscultation bilaterally Cardio: regular rate and rhythm, S1, S2 normal, no murmur, click, rub or gallop GI: soft, non-tender; bowel sounds normal; no masses,  no organomegaly Extremities: No bruising noted over the left thigh  DISPOSITION: SNF  Discharge Instructions    Call MD for:  difficulty breathing, headache or visual disturbances    Complete by:  As directed    Call MD for:  extreme fatigue    Complete by:  As directed    Call MD for:  persistant dizziness or light-headedness    Complete by:  As directed    Call MD for:  persistant nausea and vomiting    Complete by:  As directed    Call MD for:  redness, tenderness, or signs of infection (pain, swelling, redness, odor or green/yellow discharge around incision site)    Complete by:  As directed    Call MD for:  severe uncontrolled  pain    Complete by:  As directed    Call MD for:  temperature >100.4    Complete by:  As directed    Discharge instructions    Complete by:  As directed    Please see instructions on the discharge summary.  You were cared for by a hospitalist during your hospital stay. If you have any questions about your discharge medications or the care you received while you were in the hospital after you are discharged, you can call the unit and asked to speak with the hospitalist on call if the hospitalist that took care of you is not available. Once you are discharged, your primary care physician will handle any further medical issues. Please note that NO REFILLS for any discharge medications will be authorized once you are discharged, as it is imperative that you return to your primary care physician (or establish a relationship with a primary care physician if you do not have one) for your aftercare needs so that they can reassess your need for medications  and monitor your lab values. If you do not have a primary care physician, you can call [phone] for a physician referral.   Increase activity slowly    Complete by:  As directed    Weight bearing as tolerated    Complete by:  As directed       ALLERGIES: No Known Allergies   Current Discharge Medication List    START taking these medications   Details  acetaminophen (TYLENOL) 325 MG tablet Take 2 tablets (650 mg total) by mouth every 6 (six) hours as needed for mild pain (or Fever >/= 101).    doxercalciferol (HECTOROL) 4 MCG/2ML injection Inject 1 mL (2 mcg total) into the vein every Monday, Wednesday, and Friday with hemodialysis. Qty: 2 mL    enoxaparin (LOVENOX) 30 MG/0.3ML injection Inject 0.3 mLs (30 mg total) into the skin daily. Qty: 28 Syringe, Refills: 0    feeding supplement (BOOST / RESOURCE BREEZE) LIQD Take 1 Container by mouth 3 (three) times daily between meals. Refills: 0    HYDROcodone-acetaminophen (NORCO) 7.5-325 MG tablet Take 1-2 tablets by mouth every 6 (six) hours as needed for moderate pain. Qty: 90 tablet, Refills: 0    methocarbamol (ROBAXIN) 500 MG tablet Take 1 tablet (500 mg total) by mouth every 6 (six) hours as needed for muscle spasms. Qty: 30 tablet, Refills: 0      CONTINUE these medications which have NOT CHANGED   Details  atorvastatin (LIPITOR) 80 MG tablet Take 80 mg by mouth daily.     calcium acetate (PHOSLO) 667 MG capsule Take 667 mg by mouth 3 (three) times daily with meals.     [employer] ASPIRIN LOW DOSE 81 MG EC tablet TAKE 1 TABLET (81 MG TOTAL) BY MOUTH DAILY. NEED OV. Qty: 15 tablet, Refills: 0    darbepoetin (ARANESP) 100 MCG/0.5ML SOLN injection Inject 0.5 mLs (100 mcg total) into the vein every Monday with hemodialysis. Qty: 4.2 mL    memantine (NAMENDA) 5 MG tablet Take 5 mg by mouth daily.  Refills: 3    omeprazole (PRILOSEC) 20 MG capsule Take 1 capsule (20 mg total) by mouth daily. Qty: 30 capsule, Refills: 1      risperiDONE (RISPERDAL) 0.5 MG tablet Take 1 mg by mouth at bedtime.     PROAIR HFA 108 (90 Base) MCG/ACT inhaler Inhale 2 puffs into the lungs 3 (three) times daily.       STOP taking  these medications     labetalol (NORMODYNE) 300 MG tablet          Follow-up Information    Leandrew Koyanagi, MD Follow up in 2 week(s).   Specialty:  Orthopedic Surgery Why:  For suture removal, For wound re-check Contact information: [place] [address] [phone]        Shon Baton, MD. Schedule an appointment as soon as possible for a visit in 3 week(s).   Specialty:  Internal Medicine Contact information: [place] [address] [phone]           TOTAL DISCHARGE TIME: 69 mins  [facility] Pager [phone]  03/21/2017, 10:06 AM

## 2017-03-21 NOTE — Clinical Social Work Placement (Signed)
   CLINICAL SOCIAL WORK PLACEMENT  NOTE  Date:  03/21/2017  Patient Details  Name: Steven Forbes MRN: [id number] Date of Birth: [birth year]  Clinical Social Work is seeking post-discharge placement for this patient at the [place] level of care (*CSW will initial, date and re-position this form in  chart as items are completed):  Yes   Patient/family provided with [facility] Work Department's list of facilities offering this level of care within the geographic area requested by the patient (or if unable, by the patient's family).  Yes   Patient/family informed of their freedom to choose among providers that offer the needed level of care, that participate in Medicare, Medicaid or managed care program needed by the patient, have an available bed and are willing to accept the patient.  Yes   Patient/family informed of [facility]'s ownership interest in [facility] and [facility], as well as of the fact that they are under no obligation to receive care at these facilities.  PASRR submitted to EDS on       PASRR number received on 03/20/17     Existing PASRR number confirmed on 03/20/17     FL2 transmitted to all facilities in geographic area requested by pt/family on 03/20/17     FL2 transmitted to all facilities within larger geographic area on 03/20/17     Patient informed that his/her managed care company has contracts with or will negotiate with certain facilities, including the following:        Yes   Patient/family informed of bed offers received.  Patient chooses bed at [facility]     Physician recommends and patient chooses bed at      Patient to be transferred to [facility] on 03/21/17.  Patient to be transferred to facility by [facility]     Patient family notified on 03/21/17 of transfer.  Name of family member notified:  Daughter and spouse at bedside     PHYSICIAN Please prepare priority discharge summary,  including medications, Please prepare prescriptions, Please sign FL2     Additional Comment:    _______________________________________________ Normajean Baxter, LCSW 03/21/2017, 11:37 AM

## 2017-03-21 NOTE — Progress Notes (Signed)
[place] at [phone] in order to give report to receiving nurse.  Unfortunately, I was placed on hold and no one ever came phone; therefore, I was unable to give report on this pt.

## 2017-03-21 NOTE — Progress Notes (Signed)
Physical Therapy Treatment Patient Details Name: Steven Forbes MRN: [id number] DOB: 1926/02/15 Today's Date: 03/21/2017    History of Present Illness Pt is a 83 yo male with c/o of L hip pain after mechanical fall. dx with L femoral neck fx. s/p L femoral neck prosthetic placement, WBAT. PMH significant for ESRD, R transmetatarsal amp of R foot, and dementia.     PT Comments    Pt progressing slowly with therapy and continues to benefit from skilled nursing placement during session.  Pt fatigued after session and unable to follow commands for LE therapeutic exercises.  Plan to d/c to rehab this afternoon.  Pt resting comfortably in chair post tx.      Follow Up Recommendations  SNF     Equipment Recommendations  None recommended by PT    Recommendations for Other Services OT consult     Precautions / Restrictions Precautions Precautions: Fall Restrictions Weight Bearing Restrictions: Yes LLE Weight Bearing: Weight bearing as tolerated    Mobility  Bed Mobility Overal bed mobility: Needs Assistance Bed Mobility: Supine to Sit     Supine to sit: Total assist;HOB elevated     General bed mobility comments: Pt required assistance to elevated trunk into sitting and to advance B LEs to edge of bed.  Pt presents with posterior lean and required assist to maintain upper trunk control.  Pt with little to no carryover with following commands.  Likley due to [facility] or dementia.    Transfers Overall transfer level: Needs assistance Equipment used: None (sara stedy for sit to stand) Transfers: Sit to/from Stand   Stand pivot transfers: Mod assist;+2 physical assistance       General transfer comment: Pt required assistance for forward weight shifting and hand over hand for placement on stedy cross bar.  Once patient with hands on bar able to pull into standing with +2 mod assistance.  Pt sat on stedy plates during upper body dressing and bathing before transferring to chair.  Pt  fatigued post session.      Ambulation/Gait                 Stairs            Wheelchair Mobility    Modified Rankin (Stroke Patients Only)       Balance     Sitting balance-Leahy Scale: Zero       Standing balance-Leahy Scale: Poor                              Cognition Arousal/Alertness: Awake/alert Behavior During Therapy: WFL for tasks assessed/performed Overall Cognitive Status: History of cognitive impairments - at baseline                                 General Comments: daughter present and confirms that his dementia is at a baseline level      Exercises Total Joint Exercises Ankle Circles/Pumps: PROM;Left;10 reps;Supine (passive 1x10 on L.  ) Heel Slides: PROM;Left;10 reps;Supine    General Comments        Pertinent Vitals/Pain Pain Assessment: Faces Faces Pain Scale: Hurts a little bit Pain Location: L hip with movement  Pain Descriptors / Indicators: Grimacing;Guarding;Moaning Pain Intervention(s): Monitored during session;Repositioned;RN gave pain meds during session    Home Living Family/patient expects to be discharged to:: Unsure Living Arrangements: Spouse/significant other  Prior Function            PT Goals (current goals can now be found in the care plan section) Acute Rehab PT Goals Patient Stated Goal: none stated Potential to Achieve Goals: Fair Progress towards PT goals: Progressing toward goals    Frequency    Min 3X/week      PT Plan Current plan remains appropriate    Co-evaluation              AM-PAC PT "6 Clicks" Daily Activity  Outcome Measure  Difficulty turning over in bed (including adjusting bedclothes, sheets and blankets)?: Total Difficulty moving from lying on back to sitting on the side of the bed? : Total Difficulty sitting down on and standing up from a chair with arms (e.g., wheelchair, bedside commode, etc,.)?: Total Help needed  moving to and from a bed to chair (including a wheelchair)?: A Lot Help needed walking in hospital room?: Total Help needed climbing 3-5 steps with a railing? : Total 6 Click Score: 7    End of Session Equipment Utilized During Treatment: Gait belt Activity Tolerance: Patient limited by pain Patient left: in chair;with call bell/phone within reach;with nursing/sitter in room;with family/visitor present Nurse Communication: Mobility status;Patient requests pain meds PT Visit Diagnosis: Unsteadiness on feet (R26.81);Other abnormalities of gait and mobility (R26.89);History of falling (Z91.81);Difficulty in walking, not elsewhere classified (R26.2);Pain Pain - Right/Left: Left Pain - part of body: Hip     Time: 0881-1031 PT Time Calculation (min) (ACUTE ONLY): 30 min  Charges:  $Therapeutic Activity: 23-37 mins                    G CodesGovernor Rooks, PTA pager [phone]    Cristela Blue 03/21/2017, 1:33 PM

## 2017-04-06 ENCOUNTER — Encounter (INDEPENDENT_AMBULATORY_CARE_PROVIDER_SITE_OTHER): Payer: Self-pay | Admitting: Orthopaedic Surgery

## 2017-04-06 ENCOUNTER — Ambulatory Visit (INDEPENDENT_AMBULATORY_CARE_PROVIDER_SITE_OTHER): Payer: Medicare Other | Admitting: Orthopaedic Surgery

## 2017-04-06 ENCOUNTER — Ambulatory Visit (INDEPENDENT_AMBULATORY_CARE_PROVIDER_SITE_OTHER): Payer: Medicare Other

## 2017-04-06 DIAGNOSIS — S72002A Fracture of unspecified part of neck of left femur, initial encounter for closed fracture: Secondary | ICD-10-CM

## 2017-04-06 NOTE — Progress Notes (Signed)
Patient is 2 weeks status post left hip hemiarthroplasty. He is doing well. Denies any pain. He is walking with a walker slowly. His incision is healed. The staples were removed. He has no pain with hip rotation or movement. His x-rays show stable implant. From my standpoint he is doing well. Continue with physical therapy for mobilization and strengthening. Follow up in 4 weeks for recheck and AP pelvis

## 2017-05-09 ENCOUNTER — Encounter (INDEPENDENT_AMBULATORY_CARE_PROVIDER_SITE_OTHER): Payer: Self-pay | Admitting: Orthopaedic Surgery

## 2017-05-09 ENCOUNTER — Ambulatory Visit (INDEPENDENT_AMBULATORY_CARE_PROVIDER_SITE_OTHER): Payer: Medicare Other | Admitting: Orthopaedic Surgery

## 2017-05-09 ENCOUNTER — Ambulatory Visit (INDEPENDENT_AMBULATORY_CARE_PROVIDER_SITE_OTHER): Payer: Medicare Other

## 2017-05-09 DIAGNOSIS — S72002A Fracture of unspecified part of neck of left femur, initial encounter for closed fracture: Secondary | ICD-10-CM | POA: Diagnosis not present

## 2017-05-09 NOTE — Progress Notes (Signed)
Patient is 8 weeks status post left partial hip replacement. He does ambulate with a walker now. He denies any real pain. He is doing physical therapy. Surgical scar is fully healed. Painless range of motion of the hip. X-rays demonstrate partial hip replacement in good alignment. Patient has done very well from my standpoint. Continue with physical therapy for rehabilitation and strengthening. Follow up in 2 months for recheck. No x-rays needed unless he is having issues. Anticipate releasing him at that time.

## 2017-05-30 ENCOUNTER — Encounter (INDEPENDENT_AMBULATORY_CARE_PROVIDER_SITE_OTHER): Payer: Self-pay | Admitting: Orthopedic Surgery

## 2017-05-30 ENCOUNTER — Ambulatory Visit (INDEPENDENT_AMBULATORY_CARE_PROVIDER_SITE_OTHER): Payer: Medicare Other | Admitting: Orthopedic Surgery

## 2017-05-30 ENCOUNTER — Telehealth (INDEPENDENT_AMBULATORY_CARE_PROVIDER_SITE_OTHER): Payer: Self-pay | Admitting: Orthopedic Surgery

## 2017-05-30 DIAGNOSIS — L97421 Non-pressure chronic ulcer of left heel and midfoot limited to breakdown of skin: Secondary | ICD-10-CM | POA: Insufficient documentation

## 2017-05-30 DIAGNOSIS — Z89431 Acquired absence of right foot: Secondary | ICD-10-CM

## 2017-05-30 NOTE — Telephone Encounter (Signed)
Lattie Haw (Nurse) with [facility] called needing wound care orders for the patient. The number to contact Lattie Haw is [phone]

## 2017-05-30 NOTE — Progress Notes (Signed)
Office Visit Note   Patient: Steven Forbes           Date of Birth: 1926/09/17           MRN: [id number] Visit Date: 05/30/2017              Requested by: Shon Baton, [place], [place] [id number] PCP: Shon Baton, MD  Chief Complaint  Patient presents with  . Right Foot - Follow-up    Right transmetatarsal amputation   . Left Foot - Wound Check    Heel ulceration      HPI: Patient is a 83 year old gentleman who presents in follow-up for right transmetatarsal amputation of left heel decubitus ulcer. Patient developed an ulcer while in skilled nursing on the left heel. Patient's family states that he has progressive dementia. Patient by report will be discharged to home shortly.  Assessment & Plan: Visit Diagnoses:  1. S/P transmetatarsal amputation of foot, right ([place])   2. Non-pressure chronic ulcer of left heel and midfoot limited to breakdown of skin ([place])     Plan: Recommend continue with ointment to the left heel ulcer continue with the protective shoe wear. Orders were written for skilled nursing to continue with the [place] honey dressing changes they are performing.  Follow-Up Instructions: Return in about 3 months (around 08/29/2017).   Ortho Exam  Patient is not alert, not oriented, no adenopathy, well-dressed, normal affect, normal respiratory effort. Examination patient is confused. The left heel ulcer has nonviable tissue after informed consent this was debrided. The ulcer is 5 mm in diameter 5 mm deep with good granulation tissue the base there is no cellulitis no odor no drainage. Examination the right foot there is also callus over the transmetatarsal amputation. After informed consent a 10 blade knife was used to debride the callus back to healthy viable tissue no signs of deep infection.  Imaging: No results found. No images are attached to the encounter.  Labs: Lab Results  Component Value Date   HGBA1C 6.2 (H) 09/09/2013   HGBA1C   04/17/2007    5.5 (NOTE)   The ADA recommends the following therapeutic goals for glycemic   control related to Hgb A1C measurement:   Goal of Therapy:   < 7.0% Hgb A1C   Action Suggested:  > 8.0% Hgb A1C   Ref:  Diabetes Care, 22, Suppl. 1, 1999   REPTSTATUS 09/15/2013 FINAL 09/09/2013   CULT  09/09/2013    NO GROWTH 5 DAYS Performed at [facility]    Orders:  No orders of the defined types were placed in this encounter.  No orders of the defined types were placed in this encounter.    Procedures: No procedures performed  Clinical Data: No additional findings.  ROS:  All other systems negative, except as noted in the HPI. Review of Systems  Objective: Vital Signs: There were no vitals taken for this visit.  Specialty Comments:  No specialty comments available.  PMFS History: Patient Active Problem List   Diagnosis Date Noted  . Non-pressure chronic ulcer of left heel and midfoot limited to breakdown of skin ([place]) 05/30/2017  . S/P transmetatarsal amputation of foot, right ([place]) 05/30/2017  . Left displaced femoral neck fracture ([place]) 03/17/2017  . Ulcer of right foot, limited to breakdown of skin ([place]) 03/16/2017  . Acute encephalopathy   . HCAP (healthcare-associated pneumonia) 01/17/2017  . Diabetes with neurologic complications ([place]) 64/40/3474  . Moderate dementia 01/21/2015  . Aortic stenosis  07/08/2014  . CAD (coronary artery disease) 01/07/2014  . Essential hypertension 01/07/2014  . Hyperlipidemia 01/07/2014  . End stage renal disease ([place]) 12/11/2013  . Protein-calorie malnutrition, severe ([place]) 09/11/2013  . Transaminitis 09/10/2013  . Respiratory distress 09/09/2013  . NSTEMI (non-ST elevated myocardial infarction) ([place]) 09/09/2013  . ESRD on dialysis ([place]) 09/09/2013  . possible Pneumonia 09/09/2013  . Influenza B 09/09/2013  . Acute CHF ([place]) 09/09/2013  . Acute myocardial infarction, subendocardial infarction, initial episode of care  ([place]) 09/09/2013  . Secondary hyperparathyroidism ([place]) 09/09/2013  . Atherosclerotic peripheral vascular disease ([place]) 09/09/2013  . GASTRIC POLYP 12/08/2008  . HEMORRHOIDS-INTERNAL 12/08/2008  . DIABETES MELLITUS 11/25/2008  . ANEMIA 11/25/2008  . COLONIC POLYPS, HX OF 11/25/2008   Past Medical History:  Diagnosis Date  . Anemia   . Asthma   . Dementia   . Diabetes mellitus    type 2  . ESRD (end stage renal disease) ([place])    Dialysis on MONDAY, [place] and FRIDAYS  . Flu 08/2014  . GERD (gastroesophageal reflux disease)   . History of blood transfusion   . HOH (hard of hearing)   . Hx of echocardiogram    a.  Echocardiogram (09/10/2013): EF 50-55%, normal wall motion, grade 2 diastolic dysfunction, mild aortic stenosis, trivial AI, MAC, moderate MR, mild LAE, moderate to severe TR, moderately increased PASP.  [name] Hx of non-ST elevation myocardial infarction (NSTEMI)    a. in setting of influenza, volume overload 08/2013 => Lexiscan Myoview (09/11/2013): Subtle ischemia in the anteroseptal wall, EF 44%. => med Rx recommended  . Hyperlipidemia   . Hypertension   . Shortness of breath    with exertion    Family History  Problem Relation Age of Onset  . Cancer Mother   . Hypertension Mother   . Cancer Father   . Diabetes Father   . Hypertension Father   . Diabetes Son   . Diabetes Daughter   . Diabetes Daughter   . Diabetes Cousin     Past Surgical History:  Procedure Laterality Date  . ANTERIOR APPROACH HEMI HIP ARTHROPLASTY Left 03/18/2017   Procedure: ANTERIOR APPROACH HEMI HIP ARTHROPLASTY LEFT;  Surgeon: Leandrew Koyanagi, MD;  Location: [facility];  Service: Orthopedics;  Laterality: Left;  . AV FISTULA PLACEMENT  2008   Left Upper Arm  . COLONOSCOPY    . EYE SURGERY Bilateral    cataract surgery  . LAPAROSCOPIC GASTROTOMY W/ REPAIR OF ULCER    . REVISON OF ARTERIOVENOUS FISTULA Left 11/21/2013   Procedure: REVISION OF ARTERIOVENOUS FISTULA;  Surgeon: Angelia Mould, MD;  Location: [facility];  Service: Vascular;  Laterality: Left;  . REVISON OF ARTERIOVENOUS FISTULA Left 02/18/2016   Procedure: excision of ulcerated skin over left brachio-cephalic AV fistula x 2;  Surgeon: [name] Misty, MD;  Location: [facility];  Service: Vascular;  Laterality: Left;  . TOE AMPUTATION Right    all toes   Social History   Occupational History  . Retired    Social History Main Topics  . Smoking status: Never Smoker  . Smokeless tobacco: Never Used  . Alcohol use No  . Drug use: No  . Sexual activity: Not on file

## 2017-05-31 NOTE — Telephone Encounter (Signed)
I called and spoke with Lattie Haw advised to continue with protected shoe wear to wear [place] honey dressing to left heel.

## 2017-07-10 ENCOUNTER — Ambulatory Visit (INDEPENDENT_AMBULATORY_CARE_PROVIDER_SITE_OTHER): Payer: Medicare Other | Admitting: Orthopedic Surgery

## 2017-07-11 ENCOUNTER — Ambulatory Visit (INDEPENDENT_AMBULATORY_CARE_PROVIDER_SITE_OTHER): Payer: Medicare Other | Admitting: Orthopaedic Surgery

## 2017-07-13 ENCOUNTER — Ambulatory Visit (INDEPENDENT_AMBULATORY_CARE_PROVIDER_SITE_OTHER): Payer: Medicare Other | Admitting: Orthopedic Surgery

## 2017-07-25 ENCOUNTER — Encounter (INDEPENDENT_AMBULATORY_CARE_PROVIDER_SITE_OTHER): Payer: Self-pay | Admitting: Orthopaedic Surgery

## 2017-07-25 ENCOUNTER — Ambulatory Visit (INDEPENDENT_AMBULATORY_CARE_PROVIDER_SITE_OTHER): Payer: Medicare Other | Admitting: Orthopaedic Surgery

## 2017-07-25 DIAGNOSIS — S72002A Fracture of unspecified part of neck of left femur, initial encounter for closed fracture: Secondary | ICD-10-CM

## 2017-07-25 NOTE — Progress Notes (Signed)
Office Visit Note   Patient: Steven Forbes           Date of Birth: September 12, 1926           MRN: [id number] Visit Date: 07/25/2017              Requested by: Shon Baton, [address] [place], [place] [address] PCP: Shon Baton, MD   Assessment & Plan: Visit Diagnoses:  1. Left displaced femoral neck fracture ([place])     Plan: Patient is doing well from a postoperative standpoint.  Continue with home exercise program.  Patient is essentially at [place].  Follow-up as needed.  Follow-Up Instructions: Return if symptoms worsen or fail to improve.   Orders:  No orders of the defined types were placed in this encounter.  No orders of the defined types were placed in this encounter.     Procedures: No procedures performed   Clinical Data: No additional findings.   Subjective: Chief Complaint  Patient presents with  . Left Hip - Pain, Follow-up    Patient is 4 months status post left hip hemiarthroplasty.  He is walking with a walker.  Denies any pain.  Has finished physical therapy.    Review of Systems   Objective: Vital Signs: There were no vitals taken for this visit.  Physical Exam  Ortho Exam Left hip exam shows a fully healed surgical scar.  He has some mild weakness with hip flexion.  Leg lengths are equal. Specialty Comments:  No specialty comments available.  Imaging: No results found.   PMFS History: Patient Active Problem List   Diagnosis Date Noted  . Non-pressure chronic ulcer of left heel and midfoot limited to breakdown of skin ([place]) 05/30/2017  . S/P transmetatarsal amputation of foot, right ([place]) 05/30/2017  . Left displaced femoral neck fracture ([place]) 03/17/2017  . Ulcer of right foot, limited to breakdown of skin ([place]) 03/16/2017  . Acute encephalopathy   . HCAP (healthcare-associated pneumonia) 01/17/2017  . Diabetes with neurologic complications ([place]) 43/32/9518  . Moderate dementia 01/21/2015  . Aortic stenosis 07/08/2014  . CAD  (coronary artery disease) 01/07/2014  . Essential hypertension 01/07/2014  . Hyperlipidemia 01/07/2014  . End stage renal disease ([place]) 12/11/2013  . Protein-calorie malnutrition, severe ([place]) 09/11/2013  . Transaminitis 09/10/2013  . Respiratory distress 09/09/2013  . NSTEMI (non-ST elevated myocardial infarction) ([place]) 09/09/2013  . ESRD on dialysis ([place]) 09/09/2013  . possible Pneumonia 09/09/2013  . Influenza B 09/09/2013  . Acute CHF ([place]) 09/09/2013  . Acute myocardial infarction, subendocardial infarction, initial episode of care ([place]) 09/09/2013  . Secondary hyperparathyroidism ([place]) 09/09/2013  . Atherosclerotic peripheral vascular disease ([place]) 09/09/2013  . GASTRIC POLYP 12/08/2008  . HEMORRHOIDS-INTERNAL 12/08/2008  . DIABETES MELLITUS 11/25/2008  . ANEMIA 11/25/2008  . COLONIC POLYPS, HX OF 11/25/2008   Past Medical History:  Diagnosis Date  . Anemia   . Asthma   . Dementia   . Diabetes mellitus    type 2  . ESRD (end stage renal disease) ([place])    Dialysis on MONDAY, [place] and FRIDAYS  . Flu 08/2014  . GERD (gastroesophageal reflux disease)   . History of blood transfusion   . HOH (hard of hearing)   . Hx of echocardiogram    a.  Echocardiogram (09/10/2013): EF 50-55%, normal wall motion, grade 2 diastolic dysfunction, mild aortic stenosis, trivial AI, MAC, moderate MR, mild LAE, moderate to severe TR, moderately increased PASP.  [name] Hx of non-ST elevation myocardial infarction (  NSTEMI)    a. in setting of influenza, volume overload 08/2013 => Lexiscan Myoview (09/11/2013): Subtle ischemia in the anteroseptal wall, EF 44%. => med Rx recommended  . Hyperlipidemia   . Hypertension   . Shortness of breath    with exertion    Family History  Problem Relation Age of Onset  . Cancer Mother   . Hypertension Mother   . Cancer Father   . Diabetes Father   . Hypertension Father   . Diabetes Son   . Diabetes Daughter   . Diabetes Daughter   . Diabetes Cousin       Past Surgical History:  Procedure Laterality Date  . AV FISTULA PLACEMENT  2008   Left Upper Arm  . COLONOSCOPY    . EYE SURGERY Bilateral    cataract surgery  . LAPAROSCOPIC GASTROTOMY W/ REPAIR OF ULCER    . TOE AMPUTATION Right    all toes   Social History   Occupational History  . Occupation: Retired  Tobacco Use  . Smoking status: Never Smoker  . Smokeless tobacco: Never Used  Substance and Sexual Activity  . Alcohol use: No    Alcohol/week: 0.0 oz  . Drug use: No  . Sexual activity: Not on file

## 2017-08-14 ENCOUNTER — Encounter (INDEPENDENT_AMBULATORY_CARE_PROVIDER_SITE_OTHER): Payer: Self-pay | Admitting: Family

## 2017-08-14 ENCOUNTER — Ambulatory Visit (INDEPENDENT_AMBULATORY_CARE_PROVIDER_SITE_OTHER): Payer: Medicare Other

## 2017-08-14 ENCOUNTER — Ambulatory Visit (INDEPENDENT_AMBULATORY_CARE_PROVIDER_SITE_OTHER): Payer: Medicare Other | Admitting: Family

## 2017-08-14 DIAGNOSIS — L97511 Non-pressure chronic ulcer of other part of right foot limited to breakdown of skin: Secondary | ICD-10-CM

## 2017-08-14 DIAGNOSIS — M25552 Pain in left hip: Secondary | ICD-10-CM

## 2017-08-14 DIAGNOSIS — I70209 Unspecified atherosclerosis of native arteries of extremities, unspecified extremity: Secondary | ICD-10-CM | POA: Diagnosis not present

## 2017-08-14 DIAGNOSIS — Z89431 Acquired absence of right foot: Secondary | ICD-10-CM

## 2017-08-14 DIAGNOSIS — E0840 Diabetes mellitus due to underlying condition with diabetic neuropathy, unspecified: Secondary | ICD-10-CM

## 2017-08-14 DIAGNOSIS — G8929 Other chronic pain: Secondary | ICD-10-CM | POA: Diagnosis not present

## 2017-08-14 DIAGNOSIS — M25561 Pain in right knee: Secondary | ICD-10-CM

## 2017-08-14 DIAGNOSIS — L97421 Non-pressure chronic ulcer of left heel and midfoot limited to breakdown of skin: Secondary | ICD-10-CM

## 2017-08-14 MED ORDER — DOXYCYCLINE HYCLATE 100 MG PO TABS: 100.0000 mg | ORAL_TABLET | Freq: Two times a day (BID) | ORAL | 0 refills | Status: DC

## 2017-08-14 MED ORDER — SILVER SULFADIAZINE 1 % EX CREA: 1.0000 "application " | TOPICAL_CREAM | Freq: Every day | CUTANEOUS | 0 refills | Status: DC

## 2017-08-16 ENCOUNTER — Encounter (INDEPENDENT_AMBULATORY_CARE_PROVIDER_SITE_OTHER): Payer: Self-pay | Admitting: Family

## 2017-08-16 NOTE — Progress Notes (Signed)
Office Visit Note   Patient: Steven Forbes           Date of Birth: Nov 30, 1925           MRN: [id number] Visit Date: 08/14/2017              Requested by: Shon Baton, [address] [place], [place] [address] PCP: Shon Baton, MD  Chief Complaint  Patient presents with  . Right Foot - Follow-up      HPI: The patient is an 83 year old gentleman seen today for evaluation of discoloration and blistering to his right transmetatarsal amputation. Family noticed change in color and blistering about a week ago. Wonder if ischemic changes. Has no new pain associated.   Does have stiffness and pain to right knee. Wife feels this is disabling for him, hinders his ambulation and ability to transfer. Did have a hip replacement 03/18/17 with Dr. Erlinda Hong.  Wife is concerned for hip pain on the left.  On review of the chart the patient was recently seen by vascular for issues with his dialysis graft.  At the time they recommended no intervention due to overall health care decline.  Assessment & Plan: Visit Diagnoses:  1. Chronic pain of right knee   2. Pain in left hip   3. Atherosclerotic peripheral vascular disease ([place])   4. Diabetes mellitus due to underlying condition with diabetic neuropathy, without long-term current use of insulin ([place])   5. Ulcer of right foot, limited to breakdown of skin ([place])   6. Non-pressure chronic ulcer of left heel and midfoot limited to breakdown of skin ([place])   7. S/P transmetatarsal amputation of foot, right ([place])     Plan: Will proceed with Silvadene dressing changes to the dorsal right foot as well as the left lateral heel ulcers.  Provided a PRAFO boot for his left heel.  Discussed offloading the left heel with the family.  Discussed possibility for vascular surgery referral as well as consideration of surgical intervention for right lower extremity.  Discussed health and prognosis.  Patient family with close follow-up in our office for ulcerations will hold  on vascular surgery referral.  Discussed osteoarthritis right knee and treatment options.  We will hold on enter prevention at this time.  Reassurance provided left hip stable.  Follow-Up Instructions: No Follow-up on file.   Right Knee Exam   Tenderness  Right knee tenderness location: unable to discern.  Range of Motion  Extension: -30  Flexion: 100   Other  Erythema: absent Swelling: mild Effusion: no effusion present   Left Hip Exam  Left hip exam is normal.  Range of Motion  The patient has normal left hip ROM.  Muscle Strength  The patient has normal left hip strength.       Patient is alert, disoriented, no adenopathy, well-dressed, normal respiratory effort.  Sitting in a wheelchair unable to verbally respond to questions about his care.  Drooling upon exam.  On examination of the right foot there is a 3 cm in diameter dorsal ulceration this is open and filled with eschar.  There is some thin peeling skin over the dorsum of his ankle and foot.  No drainage.  Unable to palpate dorsalis pedis or posterior tibialis pulse.  No odor no erythema no sign of infection.  On examination of the left foot he has a 5 mm in diameter ulcer to his lateral heel this proves 4 mm deep there is exudative tissue in the wound  bed scant serous drainage.  No surrounding erythema no odor.   Imaging: No results found. No images are attached to the encounter.  Labs: Lab Results  Component Value Date   HGBA1C 6.2 (H) 09/09/2013   HGBA1C  04/17/2007    5.5 (NOTE)   The ADA recommends the following therapeutic goals for glycemic   control related to Hgb A1C measurement:   Goal of Therapy:   < 7.0% Hgb A1C   Action Suggested:  > 8.0% Hgb A1C   Ref:  Diabetes Care, 22, Suppl. 1, 1999   REPTSTATUS 09/15/2013 FINAL 09/09/2013   CULT  09/09/2013    NO GROWTH 5 DAYS Performed at [facility]    _0 (HGBA1)@  _1 @  Orders:  Orders Placed This Encounter  Procedures    . XR Knee 1-2 Views Right  . XR HIP UNILAT W OR W/O PELVIS 1V LEFT   Meds ordered this encounter  Medications  . silver sulfADIAZINE (SILVADENE) 1 % cream    Sig: Apply 1 application topically daily.    Dispense:  50 g    Refill:  0  . doxycycline (VIBRA-TABS) 100 MG tablet    Sig: Take 1 tablet (100 mg total) by mouth 2 (two) times daily.    Dispense:  30 tablet    Refill:  0     Procedures: No procedures performed  Clinical Data: No additional findings.  ROS:  All other systems negative, except as noted in the HPI. Review of Systems  Objective: Vital Signs: There were no vitals taken for this visit.  Specialty Comments:  No specialty comments available.  PMFS History: Patient Active Problem List   Diagnosis Date Noted  . Non-pressure chronic ulcer of left heel and midfoot limited to breakdown of skin ([place]) 05/30/2017  . S/P transmetatarsal amputation of foot, right ([place]) 05/30/2017  . Left displaced femoral neck fracture ([place]) 03/17/2017  . Ulcer of right foot, limited to breakdown of skin ([place]) 03/16/2017  . Acute encephalopathy   . HCAP (healthcare-associated pneumonia) 01/17/2017  . Diabetes with neurologic complications ([place]) 56/38/7564  . Moderate dementia 01/21/2015  . Aortic stenosis 07/08/2014  . CAD (coronary artery disease) 01/07/2014  . Essential hypertension 01/07/2014  . Hyperlipidemia 01/07/2014  . End stage renal disease ([place]) 12/11/2013  . Protein-calorie malnutrition, severe ([place]) 09/11/2013  . Transaminitis 09/10/2013  . Respiratory distress 09/09/2013  . NSTEMI (non-ST elevated myocardial infarction) ([place]) 09/09/2013  . ESRD on dialysis ([place]) 09/09/2013  . possible Pneumonia 09/09/2013  . Influenza B 09/09/2013  . Acute CHF ([place]) 09/09/2013  . Acute myocardial infarction, subendocardial infarction, initial episode of care ([place]) 09/09/2013  . Secondary hyperparathyroidism ([place]) 09/09/2013  . Atherosclerotic peripheral vascular disease  ([place]) 09/09/2013  . GASTRIC POLYP 12/08/2008  . HEMORRHOIDS-INTERNAL 12/08/2008  . DIABETES MELLITUS 11/25/2008  . ANEMIA 11/25/2008  . COLONIC POLYPS, HX OF 11/25/2008   Past Medical History:  Diagnosis Date  . Anemia   . Asthma   . Dementia   . Diabetes mellitus    type 2  . ESRD (end stage renal disease) ([place])    Dialysis on MONDAY, [place] and FRIDAYS  . Flu 08/2014  . GERD (gastroesophageal reflux disease)   . History of blood transfusion   . HOH (hard of hearing)   . Hx of echocardiogram    a.  Echocardiogram (09/10/2013): EF 50-55%, normal wall motion, grade 2 diastolic dysfunction, mild aortic stenosis, trivial AI, MAC, moderate MR, mild LAE, moderate to severe TR, moderately  increased PASP.  [name] Hx of non-ST elevation myocardial infarction (NSTEMI)    a. in setting of influenza, volume overload 08/2013 => Lexiscan Myoview (09/11/2013): Subtle ischemia in the anteroseptal wall, EF 44%. => med Rx recommended  . Hyperlipidemia   . Hypertension   . Shortness of breath    with exertion    Family History  Problem Relation Age of Onset  . Cancer Mother   . Hypertension Mother   . Cancer Father   . Diabetes Father   . Hypertension Father   . Diabetes Son   . Diabetes Daughter   . Diabetes Daughter   . Diabetes Cousin     Past Surgical History:  Procedure Laterality Date  . ANTERIOR APPROACH HEMI HIP ARTHROPLASTY Left 03/18/2017   Procedure: ANTERIOR APPROACH HEMI HIP ARTHROPLASTY LEFT;  Surgeon: Leandrew Koyanagi, MD;  Location: [facility];  Service: Orthopedics;  Laterality: Left;  . AV FISTULA PLACEMENT  2008   Left Upper Arm  . COLONOSCOPY    . EYE SURGERY Bilateral    cataract surgery  . LAPAROSCOPIC GASTROTOMY W/ REPAIR OF ULCER    . REVISON OF ARTERIOVENOUS FISTULA Left 11/21/2013   Procedure: REVISION OF ARTERIOVENOUS FISTULA;  Surgeon: Angelia Mould, MD;  Location: [facility];  Service: Vascular;  Laterality: Left;  . REVISON OF ARTERIOVENOUS FISTULA Left 02/18/2016    Procedure: excision of ulcerated skin over left brachio-cephalic AV fistula x 2;  Surgeon: [name] Misty, MD;  Location: [facility];  Service: Vascular;  Laterality: Left;  . TOE AMPUTATION Right    all toes   Social History   Occupational History  . Occupation: Retired  Tobacco Use  . Smoking status: Never Smoker  . Smokeless tobacco: Never Used  Substance and Sexual Activity  . Alcohol use: No    Alcohol/week: 0.0 oz  . Drug use: No  . Sexual activity: Not on file

## 2017-08-24 ENCOUNTER — Ambulatory Visit (INDEPENDENT_AMBULATORY_CARE_PROVIDER_SITE_OTHER): Payer: Medicare Other | Admitting: Orthopaedic Surgery

## 2017-08-24 ENCOUNTER — Encounter (INDEPENDENT_AMBULATORY_CARE_PROVIDER_SITE_OTHER): Payer: Self-pay | Admitting: Family

## 2017-08-24 ENCOUNTER — Telehealth (INDEPENDENT_AMBULATORY_CARE_PROVIDER_SITE_OTHER): Payer: Self-pay | Admitting: Radiology

## 2017-08-24 DIAGNOSIS — N186 End stage renal disease: Secondary | ICD-10-CM | POA: Diagnosis not present

## 2017-08-24 DIAGNOSIS — L97909 Non-pressure chronic ulcer of unspecified part of unspecified lower leg with unspecified severity: Secondary | ICD-10-CM

## 2017-08-24 DIAGNOSIS — L97421 Non-pressure chronic ulcer of left heel and midfoot limited to breakdown of skin: Secondary | ICD-10-CM

## 2017-08-24 DIAGNOSIS — Z89431 Acquired absence of right foot: Secondary | ICD-10-CM | POA: Diagnosis not present

## 2017-08-24 DIAGNOSIS — Z992 Dependence on renal dialysis: Secondary | ICD-10-CM

## 2017-08-24 DIAGNOSIS — E0840 Diabetes mellitus due to underlying condition with diabetic neuropathy, unspecified: Secondary | ICD-10-CM

## 2017-08-24 DIAGNOSIS — E43 Unspecified severe protein-calorie malnutrition: Secondary | ICD-10-CM

## 2017-08-24 MED ORDER — SILVER SULFADIAZINE 1 % EX CREA
1.0000 | TOPICAL_CREAM | Freq: Every day | CUTANEOUS | 0 refills | Status: DC
Start: 2017-08-24 — End: 2017-08-24

## 2017-08-24 MED ORDER — SILVER SULFADIAZINE 1 % EX CREA: 1.0000 "application " | TOPICAL_CREAM | Freq: Every day | CUTANEOUS | 0 refills | Status: AC

## 2017-08-24 NOTE — Telephone Encounter (Signed)
Silvadene rx refill sent to [facility] on [address].

## 2017-08-24 NOTE — Progress Notes (Signed)
Office Visit Note   Patient: Steven Forbes           Date of Birth: 1925/12/30           MRN: [id number] Visit Date: 08/24/2017              Requested by: Shon Baton, MD [address] [place], [place] [address] PCP: Shon Baton, MD  HPI: The patient is an 83 year old gentleman seen today in follow up for ulceration and ischemic changes to RLE. Has history of right transmetatarsal amputation. HH has been assisting with care. Sounds like have been doing Xeroform dressings.   Per daughter who accompanies, patient has had decreased po intake, weight loss paired with over all decline. Nephrology having goals of care discussions with family, family aware of eventual withdrawal of dialysis.  On review of the chart the patient was recently seen by vascular for issues with his dialysis graft.  At the time they recommended no intervention due to overall health care decline.  Assessment & Plan: Visit Diagnoses:  1. Diabetes mellitus due to underlying condition with diabetic neuropathy, without long-term current use of insulin ([place])   2. Ischemic leg ulcer ([place])   3. ESRD on dialysis ([place])   4. Non-pressure chronic ulcer of left heel and midfoot limited to breakdown of skin ([place])   5. S/P transmetatarsal amputation of foot, right ([place])   6. Protein-calorie malnutrition, severe ([place])     Plan: Will proceed with Silvadene dressing changes to the dorsal right foot and lower extremity as well as the left lateral heel ulcers.      Discussed health and prognosis.  Will proceed with wound care and close follow-up in our office for ulcerations will hold on intervention at this time.  Patient is very fragile medically and cannot tolerate any surgery at this point.  We will allow the ischemic ulcers to demarcate.  Follow-up with Dr. Sharol Given in 1 week. Total face to face encounter time was greater than 25 minutes and over half of this time was spent in counseling and/or coordination of care.  Follow-Up  Instructions: Return in about 1 week (around 08/31/2017).   Ortho Exam  Patient is alert, disoriented, no adenopathy, well-dressed, normal respiratory effort.  Sitting in a wheelchair noverbal.  Drooling upon exam.  On examination of the right lower extremity now has anterior eschar/ischemic changes from mid tibia to distal foot. Scant granulation to wound edges. No drainage. No cellulitis. No odor. Unable to palpate popliteal, dorsalis pedis, posterior tibialis pulse.   On examination of the left foot he has a 5 mm in diameter ulcer to his lateral heel this proves 4 mm deep there is exudative tissue in the wound bed scant serous drainage.  No surrounding erythema no odor.   Imaging: No results found. No images are attached to the encounter.  Labs: Lab Results  Component Value Date   HGBA1C 6.2 (H) 09/09/2013   HGBA1C  04/17/2007    5.5 (NOTE)   The ADA recommends the following therapeutic goals for glycemic   control related to Hgb A1C measurement:   Goal of Therapy:   < 7.0% Hgb A1C   Action Suggested:  > 8.0% Hgb A1C   Ref:  Diabetes Care, 22, Suppl. 1, 1999   REPTSTATUS 09/15/2013 FINAL 09/09/2013   CULT  09/09/2013    NO GROWTH 5 DAYS Performed at [facility]    Orders:  No orders of the defined types were placed in this  encounter.  Meds ordered this encounter  Medications  . DISCONTD: silver sulfADIAZINE (SILVADENE) 1 % cream    Sig: Apply 1 application topically daily.    Dispense:  400 g    Refill:  0     Procedures: No procedures performed  Clinical Data: No additional findings.  ROS:  All other systems negative, except as noted in the HPI. Review of Systems  Constitutional: Positive for activity change and appetite change. Negative for chills and fever.  Cardiovascular: Negative for leg swelling.  Skin: Positive for color change and wound.  Neurological: Positive for weakness.    Objective: Vital Signs: There were no vitals taken for this  visit.  Specialty Comments:  No specialty comments available.  PMFS History: Patient Active Problem List   Diagnosis Date Noted  . Ischemic leg ulcer ([place]) 08/24/2017  . Non-pressure chronic ulcer of left heel and midfoot limited to breakdown of skin ([place]) 05/30/2017  . S/P transmetatarsal amputation of foot, right ([place]) 05/30/2017  . Left displaced femoral neck fracture ([place]) 03/17/2017  . Ulcer of right foot, limited to breakdown of skin ([place]) 03/16/2017  . Acute encephalopathy   . HCAP (healthcare-associated pneumonia) 01/17/2017  . Diabetes with neurologic complications ([place]) 24/58/0998  . Moderate dementia 01/21/2015  . Aortic stenosis 07/08/2014  . CAD (coronary artery disease) 01/07/2014  . Essential hypertension 01/07/2014  . Hyperlipidemia 01/07/2014  . End stage renal disease ([place]) 12/11/2013  . Protein-calorie malnutrition, severe ([place]) 09/11/2013  . Transaminitis 09/10/2013  . Respiratory distress 09/09/2013  . NSTEMI (non-ST elevated myocardial infarction) ([place]) 09/09/2013  . ESRD on dialysis ([place]) 09/09/2013  . possible Pneumonia 09/09/2013  . Influenza B 09/09/2013  . Acute CHF ([place]) 09/09/2013  . Acute myocardial infarction, subendocardial infarction, initial episode of care ([place]) 09/09/2013  . Secondary hyperparathyroidism ([place]) 09/09/2013  . Atherosclerotic peripheral vascular disease ([place]) 09/09/2013  . GASTRIC POLYP 12/08/2008  . HEMORRHOIDS-INTERNAL 12/08/2008  . DIABETES MELLITUS 11/25/2008  . ANEMIA 11/25/2008  . COLONIC POLYPS, HX OF 11/25/2008   Past Medical History:  Diagnosis Date  . Anemia   . Asthma   . Dementia   . Diabetes mellitus    type 2  . ESRD (end stage renal disease) ([place])    Dialysis on MONDAY, [place] and FRIDAYS  . Flu 08/2014  . GERD (gastroesophageal reflux disease)   . History of blood transfusion   . HOH (hard of hearing)   . Hx of echocardiogram    a.  Echocardiogram (09/10/2013): EF 50-55%, normal wall motion,  grade 2 diastolic dysfunction, mild aortic stenosis, trivial AI, MAC, moderate MR, mild LAE, moderate to severe TR, moderately increased PASP.  [name] Hx of non-ST elevation myocardial infarction (NSTEMI)    a. in setting of influenza, volume overload 08/2013 => Lexiscan Myoview (09/11/2013): Subtle ischemia in the anteroseptal wall, EF 44%. => med Rx recommended  . Hyperlipidemia   . Hypertension   . Shortness of breath    with exertion    Family History  Problem Relation Age of Onset  . Cancer Mother   . Hypertension Mother   . Cancer Father   . Diabetes Father   . Hypertension Father   . Diabetes Son   . Diabetes Daughter   . Diabetes Daughter   . Diabetes Cousin     Past Surgical History:  Procedure Laterality Date  . ANTERIOR APPROACH HEMI HIP ARTHROPLASTY Left 03/18/2017   Procedure: ANTERIOR APPROACH HEMI HIP ARTHROPLASTY LEFT;  Surgeon: Leandrew Koyanagi, MD;  Location: [facility];  Service: Orthopedics;  Laterality: Left;  . AV FISTULA PLACEMENT  2008   Left Upper Arm  . COLONOSCOPY    . EYE SURGERY Bilateral    cataract surgery  . LAPAROSCOPIC GASTROTOMY W/ REPAIR OF ULCER    . REVISON OF ARTERIOVENOUS FISTULA Left 11/21/2013   Procedure: REVISION OF ARTERIOVENOUS FISTULA;  Surgeon: Angelia Mould, MD;  Location: [facility];  Service: Vascular;  Laterality: Left;  . REVISON OF ARTERIOVENOUS FISTULA Left 02/18/2016   Procedure: excision of ulcerated skin over left brachio-cephalic AV fistula x 2;  Surgeon: [name] Misty, MD;  Location: [facility];  Service: Vascular;  Laterality: Left;  . TOE AMPUTATION Right    all toes   Social History   Occupational History  . Occupation: Retired  Tobacco Use  . Smoking status: Never Smoker  . Smokeless tobacco: Never Used  Substance and Sexual Activity  . Alcohol use: No    Alcohol/week: 0.0 oz  . Drug use: No  . Sexual activity: Not on file

## 2017-08-25 ENCOUNTER — Other Ambulatory Visit (INDEPENDENT_AMBULATORY_CARE_PROVIDER_SITE_OTHER): Payer: Self-pay | Admitting: Family

## 2017-08-29 ENCOUNTER — Encounter (INDEPENDENT_AMBULATORY_CARE_PROVIDER_SITE_OTHER): Payer: Self-pay

## 2017-08-29 ENCOUNTER — Ambulatory Visit (INDEPENDENT_AMBULATORY_CARE_PROVIDER_SITE_OTHER): Payer: Medicare Other | Admitting: Orthopedic Surgery

## 2017-08-29 NOTE — Telephone Encounter (Signed)
Rx request 

## 2017-08-31 ENCOUNTER — Ambulatory Visit (INDEPENDENT_AMBULATORY_CARE_PROVIDER_SITE_OTHER): Payer: Medicare Other | Admitting: Family

## 2017-08-31 ENCOUNTER — Ambulatory Visit (INDEPENDENT_AMBULATORY_CARE_PROVIDER_SITE_OTHER): Payer: Medicare Other | Admitting: Orthopaedic Surgery

## 2017-09-07 ENCOUNTER — Ambulatory Visit (INDEPENDENT_AMBULATORY_CARE_PROVIDER_SITE_OTHER): Payer: Medicare Other | Admitting: Orthopedic Surgery

## 2017-09-07 ENCOUNTER — Encounter (INDEPENDENT_AMBULATORY_CARE_PROVIDER_SITE_OTHER): Payer: Self-pay | Admitting: Orthopedic Surgery

## 2017-09-07 DIAGNOSIS — I70261 Atherosclerosis of native arteries of extremities with gangrene, right leg: Secondary | ICD-10-CM

## 2017-09-07 NOTE — Progress Notes (Signed)
Office Visit Note   Patient: Steven Forbes           Date of Birth: 03-03-1926           MRN: [id number] Visit Date: 09/07/2017              Requested by: Shon Baton, [address] [place], [place] [address] PCP: Shon Baton, MD  Chief Complaint  Patient presents with  . Right Leg - Pain, Follow-up      HPI: Patient is an 83 year old gentleman with severe peripheral vascular disease.  He is status post transmetatarsal amputation on the right.  Patient has had progressive gangrenous changes to the right lower extremity he presents at this time with a cold gangrenous right lower extremity.  Patient has been undergoing conservative wound care with dressing changes at home.  He has end-stage renal disease and is on dialysis Monday Wednesday Friday.  Assessment & Plan: Visit Diagnoses:  1. Atherosclerosis of native arteries of extremities with gangrene, right leg (HCC)     Plan: Discussed with the patient and his family who also contacted other family over the phone the urgency of proceeding with an above-the-knee amputation.  Discussed that patient is increased risk of sepsis increased risks of complications from his sepsis.  Patient and family state they understand but want to wait for surgery until after Christmas.  I discussed that I would and that if he develops sepsis or any medical complications from the gangrenous changes to the right lower extremity that they would need to go to the emergency room have emergent surgery to proceed with above-knee amputation.  I informed the family that I will be out of town the week of Christmas and encouraged proceeding with above-the-knee amputation procedure tomorrow.  Patient and family state they understand and want to wait until January 2.  We will have them with dry dressing changes not use any ointment.  Patient has been told in the past not to undergo general anesthesia due to his multiple medical problems and risks of surgery.  Discussed  that this is not an elective procedure and that the proposed AKA procedure is a matter of life or death.   Follow-Up Instructions: Return in about 3 weeks (around 09/28/2017).   Ortho Exam  Patient is alert, oriented, no adenopathy, well-dressed, normal affect, normal respiratory effort. Examination patient's right lower extremity shows progressive gangrenous changes the photographs show the progression of slight wound dehiscence of the transmetatarsal amputation to gangrenous changes involving the mid tibia distally.  The foot is ice cold this is an acute change from previous examination.  The foot has wet gangrene with no viable tissue.  The ischemic gangrenous ulcers extend up to the mid tibia.  There is a foul-smelling odor.  The leg is also cold to the touch.  Patient does not have a palpable popliteal pulse.  He does have a strong femoral pulse.  The thigh is warm to the touch with no ischemic changes.   Imaging: No results found. No images are attached to the encounter.  Labs: Lab Results  Component Value Date   HGBA1C 6.2 (H) 09/09/2013   HGBA1C  04/17/2007    5.5 (NOTE)   The ADA recommends the following therapeutic goals for glycemic   control related to Hgb A1C measurement:   Goal of Therapy:   < 7.0% Hgb A1C   Action Suggested:  > 8.0% Hgb A1C   Ref:  Diabetes Care, 22, Suppl. 1, 1999  REPTSTATUS 09/15/2013 FINAL 09/09/2013   CULT  09/09/2013    NO GROWTH 5 DAYS Performed at [facility]    _0 (HGBA1)@  There is no height or weight on file to calculate BMI.  Orders:  No orders of the defined types were placed in this encounter.  No orders of the defined types were placed in this encounter.    Procedures: No procedures performed  Clinical Data: No additional findings.  ROS:  All other systems negative, except as noted in the HPI. Review of Systems  Objective: Vital Signs: There were no vitals taken for this visit.  Specialty Comments:    No specialty comments available.  PMFS History: Patient Active Problem List   Diagnosis Date Noted  . Atherosclerosis of native arteries of extremities with gangrene, right leg ([place]) 09/07/2017  . Ischemic leg ulcer ([place]) 08/24/2017  . Non-pressure chronic ulcer of left heel and midfoot limited to breakdown of skin ([place]) 05/30/2017  . S/P transmetatarsal amputation of foot, right ([place]) 05/30/2017  . Left displaced femoral neck fracture ([place]) 03/17/2017  . Ulcer of right foot, limited to breakdown of skin ([place]) 03/16/2017  . Acute encephalopathy   . HCAP (healthcare-associated pneumonia) 01/17/2017  . Diabetes with neurologic complications ([place]) 98/33/8250  . Moderate dementia 01/21/2015  . Aortic stenosis 07/08/2014  . CAD (coronary artery disease) 01/07/2014  . Essential hypertension 01/07/2014  . Hyperlipidemia 01/07/2014  . End stage renal disease ([place]) 12/11/2013  . Protein-calorie malnutrition, severe ([place]) 09/11/2013  . Transaminitis 09/10/2013  . Respiratory distress 09/09/2013  . NSTEMI (non-ST elevated myocardial infarction) ([place]) 09/09/2013  . ESRD on dialysis ([place]) 09/09/2013  . possible Pneumonia 09/09/2013  . Influenza B 09/09/2013  . Acute CHF ([place]) 09/09/2013  . Acute myocardial infarction, subendocardial infarction, initial episode of care ([place]) 09/09/2013  . Secondary hyperparathyroidism ([place]) 09/09/2013  . Atherosclerotic peripheral vascular disease ([place]) 09/09/2013  . GASTRIC POLYP 12/08/2008  . HEMORRHOIDS-INTERNAL 12/08/2008  . DIABETES MELLITUS 11/25/2008  . ANEMIA 11/25/2008  . COLONIC POLYPS, HX OF 11/25/2008   Past Medical History:  Diagnosis Date  . Anemia   . Asthma   . Dementia   . Diabetes mellitus    type 2  . ESRD (end stage renal disease) ([place])    Dialysis on MONDAY, [place] and FRIDAYS  . Flu 08/2014  . GERD (gastroesophageal reflux disease)   . History of blood transfusion   . HOH (hard of hearing)   . Hx of echocardiogram    a.   Echocardiogram (09/10/2013): EF 50-55%, normal wall motion, grade 2 diastolic dysfunction, mild aortic stenosis, trivial AI, MAC, moderate MR, mild LAE, moderate to severe TR, moderately increased PASP.  [name] Hx of non-ST elevation myocardial infarction (NSTEMI)    a. in setting of influenza, volume overload 08/2013 => Lexiscan Myoview (09/11/2013): Subtle ischemia in the anteroseptal wall, EF 44%. => med Rx recommended  . Hyperlipidemia   . Hypertension   . Shortness of breath    with exertion    Family History  Problem Relation Age of Onset  . Cancer Mother   . Hypertension Mother   . Cancer Father   . Diabetes Father   . Hypertension Father   . Diabetes Son   . Diabetes Daughter   . Diabetes Daughter   . Diabetes Cousin     Past Surgical History:  Procedure Laterality Date  . ANTERIOR APPROACH HEMI HIP ARTHROPLASTY Left 03/18/2017   Procedure: ANTERIOR APPROACH HEMI HIP ARTHROPLASTY LEFT;  Surgeon: Erlinda Hong,  Marylynn Pearson, MD;  Location: [facility];  Service: Orthopedics;  Laterality: Left;  . AV FISTULA PLACEMENT  2008   Left Upper Arm  . COLONOSCOPY    . EYE SURGERY Bilateral    cataract surgery  . LAPAROSCOPIC GASTROTOMY W/ REPAIR OF ULCER    . REVISON OF ARTERIOVENOUS FISTULA Left 11/21/2013   Procedure: REVISION OF ARTERIOVENOUS FISTULA;  Surgeon: Angelia Mould, MD;  Location: [facility];  Service: Vascular;  Laterality: Left;  . REVISON OF ARTERIOVENOUS FISTULA Left 02/18/2016   Procedure: excision of ulcerated skin over left brachio-cephalic AV fistula x 2;  Surgeon: [name] Misty, MD;  Location: [facility];  Service: Vascular;  Laterality: Left;  . TOE AMPUTATION Right    all toes   Social History   Occupational History  . Occupation: Retired  Tobacco Use  . Smoking status: Never Smoker  . Smokeless tobacco: Never Used  Substance and Sexual Activity  . Alcohol use: No    Alcohol/week: 0.0 oz  . Drug use: No  . Sexual activity: Not on file

## 2017-09-10 ENCOUNTER — Emergency Department (HOSPITAL_COMMUNITY): Payer: Medicare Other

## 2017-09-10 ENCOUNTER — Inpatient Hospital Stay (HOSPITAL_COMMUNITY)
Admission: EM | Admit: 2017-09-10 | Discharge: 2017-10-20 | DRG: 853 | Disposition: E | Payer: Medicare Other | Attending: Pulmonary Disease | Admitting: Pulmonary Disease

## 2017-09-10 ENCOUNTER — Other Ambulatory Visit: Payer: Self-pay

## 2017-09-10 ENCOUNTER — Encounter (HOSPITAL_COMMUNITY): Payer: Self-pay | Admitting: Emergency Medicine

## 2017-09-10 DIAGNOSIS — K219 Gastro-esophageal reflux disease without esophagitis: Secondary | ICD-10-CM | POA: Diagnosis present

## 2017-09-10 DIAGNOSIS — R4182 Altered mental status, unspecified: Secondary | ICD-10-CM | POA: Diagnosis not present

## 2017-09-10 DIAGNOSIS — R0600 Dyspnea, unspecified: Secondary | ICD-10-CM

## 2017-09-10 DIAGNOSIS — I361 Nonrheumatic tricuspid (valve) insufficiency: Secondary | ICD-10-CM | POA: Diagnosis not present

## 2017-09-10 DIAGNOSIS — I248 Other forms of acute ischemic heart disease: Secondary | ICD-10-CM | POA: Diagnosis not present

## 2017-09-10 DIAGNOSIS — E11649 Type 2 diabetes mellitus with hypoglycemia without coma: Secondary | ICD-10-CM | POA: Diagnosis present

## 2017-09-10 DIAGNOSIS — R627 Adult failure to thrive: Secondary | ICD-10-CM | POA: Diagnosis not present

## 2017-09-10 DIAGNOSIS — E874 Mixed disorder of acid-base balance: Secondary | ICD-10-CM | POA: Diagnosis present

## 2017-09-10 DIAGNOSIS — Z79899 Other long term (current) drug therapy: Secondary | ICD-10-CM

## 2017-09-10 DIAGNOSIS — I96 Gangrene, not elsewhere classified: Secondary | ICD-10-CM

## 2017-09-10 DIAGNOSIS — I70261 Atherosclerosis of native arteries of extremities with gangrene, right leg: Secondary | ICD-10-CM

## 2017-09-10 DIAGNOSIS — Z7982 Long term (current) use of aspirin: Secondary | ICD-10-CM

## 2017-09-10 DIAGNOSIS — I4891 Unspecified atrial fibrillation: Secondary | ICD-10-CM | POA: Diagnosis present

## 2017-09-10 DIAGNOSIS — N2581 Secondary hyperparathyroidism of renal origin: Secondary | ICD-10-CM | POA: Diagnosis present

## 2017-09-10 DIAGNOSIS — J9601 Acute respiratory failure with hypoxia: Secondary | ICD-10-CM | POA: Diagnosis present

## 2017-09-10 DIAGNOSIS — Z66 Do not resuscitate: Secondary | ICD-10-CM | POA: Diagnosis present

## 2017-09-10 DIAGNOSIS — J9690 Respiratory failure, unspecified, unspecified whether with hypoxia or hypercapnia: Secondary | ICD-10-CM

## 2017-09-10 DIAGNOSIS — F0390 Unspecified dementia without behavioral disturbance: Secondary | ICD-10-CM | POA: Diagnosis present

## 2017-09-10 DIAGNOSIS — Z452 Encounter for adjustment and management of vascular access device: Secondary | ICD-10-CM

## 2017-09-10 DIAGNOSIS — A419 Sepsis, unspecified organism: Principal | ICD-10-CM | POA: Diagnosis present

## 2017-09-10 DIAGNOSIS — Z9911 Dependence on respirator [ventilator] status: Secondary | ICD-10-CM

## 2017-09-10 DIAGNOSIS — N179 Acute kidney failure, unspecified: Secondary | ICD-10-CM | POA: Diagnosis not present

## 2017-09-10 DIAGNOSIS — Z833 Family history of diabetes mellitus: Secondary | ICD-10-CM

## 2017-09-10 DIAGNOSIS — Z8249 Family history of ischemic heart disease and other diseases of the circulatory system: Secondary | ICD-10-CM

## 2017-09-10 DIAGNOSIS — I959 Hypotension, unspecified: Secondary | ICD-10-CM | POA: Diagnosis not present

## 2017-09-10 DIAGNOSIS — R0609 Other forms of dyspnea: Secondary | ICD-10-CM | POA: Diagnosis not present

## 2017-09-10 DIAGNOSIS — E785 Hyperlipidemia, unspecified: Secondary | ICD-10-CM | POA: Diagnosis present

## 2017-09-10 DIAGNOSIS — R1310 Dysphagia, unspecified: Secondary | ICD-10-CM | POA: Diagnosis present

## 2017-09-10 DIAGNOSIS — N186 End stage renal disease: Secondary | ICD-10-CM | POA: Diagnosis present

## 2017-09-10 DIAGNOSIS — Z992 Dependence on renal dialysis: Secondary | ICD-10-CM | POA: Diagnosis not present

## 2017-09-10 DIAGNOSIS — Z515 Encounter for palliative care: Secondary | ICD-10-CM | POA: Diagnosis not present

## 2017-09-10 DIAGNOSIS — E8770 Fluid overload, unspecified: Secondary | ICD-10-CM | POA: Diagnosis not present

## 2017-09-10 DIAGNOSIS — E876 Hypokalemia: Secondary | ICD-10-CM | POA: Diagnosis not present

## 2017-09-10 DIAGNOSIS — G9341 Metabolic encephalopathy: Secondary | ICD-10-CM | POA: Diagnosis present

## 2017-09-10 DIAGNOSIS — J690 Pneumonitis due to inhalation of food and vomit: Secondary | ICD-10-CM | POA: Diagnosis not present

## 2017-09-10 DIAGNOSIS — I120 Hypertensive chronic kidney disease with stage 5 chronic kidney disease or end stage renal disease: Secondary | ICD-10-CM | POA: Diagnosis present

## 2017-09-10 DIAGNOSIS — R52 Pain, unspecified: Secondary | ICD-10-CM | POA: Diagnosis not present

## 2017-09-10 DIAGNOSIS — E440 Moderate protein-calorie malnutrition: Secondary | ICD-10-CM | POA: Diagnosis present

## 2017-09-10 DIAGNOSIS — Z978 Presence of other specified devices: Secondary | ICD-10-CM

## 2017-09-10 DIAGNOSIS — Z89431 Acquired absence of right foot: Secondary | ICD-10-CM

## 2017-09-10 DIAGNOSIS — I2510 Atherosclerotic heart disease of native coronary artery without angina pectoris: Secondary | ICD-10-CM | POA: Diagnosis present

## 2017-09-10 DIAGNOSIS — D631 Anemia in chronic kidney disease: Secondary | ICD-10-CM | POA: Diagnosis present

## 2017-09-10 DIAGNOSIS — E1152 Type 2 diabetes mellitus with diabetic peripheral angiopathy with gangrene: Secondary | ICD-10-CM | POA: Diagnosis present

## 2017-09-10 DIAGNOSIS — R570 Cardiogenic shock: Secondary | ICD-10-CM | POA: Diagnosis not present

## 2017-09-10 DIAGNOSIS — H9190 Unspecified hearing loss, unspecified ear: Secondary | ICD-10-CM | POA: Diagnosis present

## 2017-09-10 DIAGNOSIS — E1122 Type 2 diabetes mellitus with diabetic chronic kidney disease: Secondary | ICD-10-CM | POA: Diagnosis present

## 2017-09-10 DIAGNOSIS — R6521 Severe sepsis with septic shock: Secondary | ICD-10-CM | POA: Diagnosis present

## 2017-09-10 DIAGNOSIS — Z681 Body mass index (BMI) 19 or less, adult: Secondary | ICD-10-CM | POA: Diagnosis not present

## 2017-09-10 DIAGNOSIS — D696 Thrombocytopenia, unspecified: Secondary | ICD-10-CM | POA: Diagnosis not present

## 2017-09-10 DIAGNOSIS — I252 Old myocardial infarction: Secondary | ICD-10-CM

## 2017-09-10 DIAGNOSIS — I080 Rheumatic disorders of both mitral and aortic valves: Secondary | ICD-10-CM | POA: Diagnosis present

## 2017-09-10 DIAGNOSIS — D638 Anemia in other chronic diseases classified elsewhere: Secondary | ICD-10-CM | POA: Diagnosis present

## 2017-09-10 DIAGNOSIS — Z01818 Encounter for other preprocedural examination: Secondary | ICD-10-CM

## 2017-09-10 DIAGNOSIS — Z96642 Presence of left artificial hip joint: Secondary | ICD-10-CM | POA: Diagnosis present

## 2017-09-10 LAB — CBG MONITORING, ED
GLUCOSE-CAPILLARY: 44 mg/dL — AB (ref 65–99)
GLUCOSE-CAPILLARY: 90 mg/dL (ref 65–99)
Glucose-Capillary: 18 mg/dL — CL (ref 65–99)
Glucose-Capillary: 184 mg/dL — ABNORMAL HIGH (ref 65–99)
Glucose-Capillary: 118 mg/dL — ABNORMAL HIGH (ref 65–99)

## 2017-09-10 LAB — I-STAT ARTERIAL BLOOD GAS, ED
ACID-BASE EXCESS: 2 mmol/L (ref 0.0–2.0)
Bicarbonate: 23.5 mmol/L (ref 20.0–28.0)
O2 Saturation: 99 %
TCO2: 24 mmol/L (ref 22–32)
pCO2 arterial: 25.9 mmHg — ABNORMAL LOW (ref 32.0–48.0)
pH, Arterial: 7.566 — ABNORMAL HIGH (ref 7.350–7.450)
pO2, Arterial: 133 mmHg — ABNORMAL HIGH (ref 83.0–108.0)

## 2017-09-10 LAB — I-STAT CG4 LACTIC ACID, ED
LACTIC ACID, VENOUS: 7.49 mmol/L — AB (ref 0.5–1.9)
Lactic Acid, Venous: 7.33 mmol/L (ref 0.5–1.9)

## 2017-09-10 LAB — CBC WITH DIFFERENTIAL/PLATELET
BASOS ABS: 0 10*3/uL (ref 0.0–0.1)
Basophils Relative: 0 %
Eosinophils Absolute: 0 10*3/uL (ref 0.0–0.7)
Eosinophils Relative: 0 %
HEMATOCRIT: 26.5 % — AB (ref 39.0–52.0)
Hemoglobin: 8.5 g/dL — ABNORMAL LOW (ref 13.0–17.0)
LYMPHS PCT: 7 %
Lymphs Abs: 1.5 10*3/uL (ref 0.7–4.0)
MCH: 30.1 pg (ref 26.0–34.0)
MCHC: 32.1 g/dL (ref 30.0–36.0)
MCV: 94 fL (ref 78.0–100.0)
Monocytes Absolute: 1.1 10*3/uL — ABNORMAL HIGH (ref 0.1–1.0)
Monocytes Relative: 5 %
NEUTROS ABS: 18.2 10*3/uL — AB (ref 1.7–7.7)
NEUTROS PCT: 88 %
Platelets: 174 10*3/uL (ref 150–400)
RBC: 2.82 MIL/uL — AB (ref 4.22–5.81)
RDW: 19.9 % — ABNORMAL HIGH (ref 11.5–15.5)
WBC: 20.9 10*3/uL — AB (ref 4.0–10.5)

## 2017-09-10 LAB — COMPREHENSIVE METABOLIC PANEL
ALBUMIN: 1.2 g/dL — AB (ref 3.5–5.0)
ALT: 40 U/L (ref 17–63)
ANION GAP: 12 (ref 5–15)
AST: 47 U/L — ABNORMAL HIGH (ref 15–41)
Alkaline Phosphatase: 151 U/L — ABNORMAL HIGH (ref 38–126)
BILIRUBIN TOTAL: 0.9 mg/dL (ref 0.3–1.2)
BUN: 6 mg/dL (ref 6–20)
CO2: 23 mmol/L (ref 22–32)
Calcium: 7 mg/dL — ABNORMAL LOW (ref 8.9–10.3)
Chloride: 102 mmol/L (ref 101–111)
Creatinine, Ser: 1.58 mg/dL — ABNORMAL HIGH (ref 0.61–1.24)
GFR, EST AFRICAN AMERICAN: 43 mL/min — AB (ref >60)
GFR, EST NON AFRICAN AMERICAN: 37 mL/min — AB (ref >60)
GLUCOSE: 199 mg/dL — AB (ref 65–99)
POTASSIUM: 4.2 mmol/L (ref 3.5–5.1)
Sodium: 137 mmol/L (ref 135–145)
TOTAL PROTEIN: 4.5 g/dL — AB (ref 6.5–8.1)

## 2017-09-10 LAB — I-STAT TROPONIN, ED: Troponin i, poc: 0.29 ng/mL (ref 0.00–0.08)

## 2017-09-10 LAB — GLUCOSE, CAPILLARY: GLUCOSE-CAPILLARY: 121 mg/dL — AB (ref 65–99)

## 2017-09-10 LAB — LACTIC ACID, PLASMA: LACTIC ACID, VENOUS: 7.4 mmol/L — AB (ref 0.5–1.9)

## 2017-09-10 LAB — TROPONIN I: TROPONIN I: 0.38 ng/mL — AB (ref <0.03)

## 2017-09-10 MED ORDER — INSULIN ASPART 100 UNIT/ML ~~LOC~~ SOLN
2.0000 [IU] | SUBCUTANEOUS | Status: DC
  Administered 2017-09-11 – 2017-09-13 (×4): 2 [IU] via SUBCUTANEOUS
  Administered 2017-09-13: 4 [IU] via SUBCUTANEOUS
  Administered 2017-09-13: 3 [IU] via SUBCUTANEOUS
  Administered 2017-09-14 – 2017-09-19 (×6): 2 [IU] via SUBCUTANEOUS

## 2017-09-10 MED ORDER — FENTANYL CITRATE (PF) 100 MCG/2ML IJ SOLN
INTRAMUSCULAR | Status: AC
  Filled 2017-09-10: qty 2

## 2017-09-10 MED ORDER — DEXTROSE 50 % IV SOLN
INTRAVENOUS | Status: AC
  Administered 2017-09-10: 50 mL
  Filled 2017-09-10: qty 50

## 2017-09-10 MED ORDER — ORAL CARE MOUTH RINSE
15.0000 mL | Freq: Two times a day (BID) | OROMUCOSAL | Status: DC
  Administered 2017-09-11: 15 mL via OROMUCOSAL

## 2017-09-10 MED ORDER — SODIUM CHLORIDE 0.9 % IV SOLN
Freq: Once | INTRAVENOUS | Status: AC
  Administered 2017-09-10: 19:00:00 via INTRAVENOUS

## 2017-09-10 MED ORDER — SODIUM CHLORIDE 0.9 % IV SOLN: 250.0000 mL | INTRAVENOUS | Status: DC | PRN

## 2017-09-10 MED ORDER — PANTOPRAZOLE SODIUM 40 MG IV SOLR
40.0000 mg | INTRAVENOUS | Status: DC
  Administered 2017-09-11 – 2017-09-19 (×9): 40 mg via INTRAVENOUS
  Filled 2017-09-10 (×9): qty 40

## 2017-09-10 MED ORDER — PIPERACILLIN-TAZOBACTAM 3.375 G IVPB 30 MIN
3.3750 g | Freq: Once | INTRAVENOUS | Status: AC
  Administered 2017-09-10: 3.375 g via INTRAVENOUS
  Filled 2017-09-10: qty 50

## 2017-09-10 MED ORDER — PIPERACILLIN-TAZOBACTAM IN DEX 2-0.25 GM/50ML IV SOLN: 2.2500 g | Freq: Once | INTRAVENOUS | Status: DC

## 2017-09-10 MED ORDER — FENTANYL CITRATE (PF) 100 MCG/2ML IJ SOLN
12.5000 ug | INTRAMUSCULAR | Status: DC | PRN
  Administered 2017-09-11: 12.5 ug via INTRAVENOUS
  Filled 2017-09-10: qty 2

## 2017-09-10 MED ORDER — HEPARIN SODIUM (PORCINE) 5000 UNIT/ML IJ SOLN
5000.0000 [IU] | Freq: Three times a day (TID) | INTRAMUSCULAR | Status: DC
  Administered 2017-09-10 – 2017-09-19 (×26): 5000 [IU] via SUBCUTANEOUS
  Filled 2017-09-10 (×29): qty 1

## 2017-09-10 MED ORDER — LORAZEPAM 2 MG/ML IJ SOLN
0.5000 mg | Freq: Four times a day (QID) | INTRAMUSCULAR | Status: DC | PRN
  Administered 2017-09-10 – 2017-09-12 (×4): 0.5 mg via INTRAVENOUS
  Filled 2017-09-10 (×4): qty 1

## 2017-09-10 MED ORDER — NOREPINEPHRINE BITARTRATE 1 MG/ML IV SOLN
0.0000 ug/min | Freq: Once | INTRAVENOUS | Status: AC
  Administered 2017-09-10: 5 ug/min via INTRAVENOUS
  Filled 2017-09-10: qty 4

## 2017-09-10 MED ORDER — NOREPINEPHRINE BITARTRATE 1 MG/ML IV SOLN
0.0000 ug/min | INTRAVENOUS | Status: DC
  Administered 2017-09-11: 11 ug/min via INTRAVENOUS
  Administered 2017-09-11: 10 ug/min via INTRAVENOUS
  Administered 2017-09-11: 5 ug/min via INTRAVENOUS
  Filled 2017-09-10 (×3): qty 4

## 2017-09-10 MED ORDER — VANCOMYCIN HCL IN DEXTROSE 1-5 GM/200ML-% IV SOLN
1000.0000 mg | Freq: Once | INTRAVENOUS | Status: AC
  Administered 2017-09-10: 1000 mg via INTRAVENOUS
  Filled 2017-09-10: qty 200

## 2017-09-10 NOTE — ED Notes (Signed)
Pt becoming increasingly agitated, PRN ativan given.

## 2017-09-10 NOTE — ED Notes (Signed)
If needed, call wife Mickel Baas, [phone], if unavailable, call daughter Ronny Bacon [phone].

## 2017-09-10 NOTE — ED Notes (Signed)
RN and EDP notified of Critical Lab Result 

## 2017-09-10 NOTE — Consult Note (Signed)
Reason for Consult:  Right gangrenous leg with sepsis Referring Physician:  Charlett Lango, PA-C  Steven Forbes is an 83 y.o. male.  HPI: The patient is a very sick and demented 83 year old with a known gangrenous right leg.  He has seen my partner Dr. Sharol Given who recommended an above-knee amputation.  The patient's family wanted to wait until after the Christmas holidays.  However due to significant lethargy and appearing septic he was brought from dialysis to the [facility] emergency department.  He is being seen by the critical care service and the family is at the bedside trying to decide whether or not to proceed with an above-knee amputation on the right side.  They understand this is end of life issues with her taking precedence and certainly the risks of operative and nonoperative treatment still not address is significant morbidities.  Past Medical History:  Diagnosis Date  . Anemia   . Asthma   . Dementia   . Diabetes mellitus    type 2  . ESRD (end stage renal disease) ([place])    Dialysis on MONDAY, [place] and FRIDAYS  . Flu 08/2014  . GERD (gastroesophageal reflux disease)   . History of blood transfusion   . HOH (hard of hearing)   . Hx of echocardiogram    a.  Echocardiogram (09/10/2013): EF 50-55%, normal wall motion, grade 2 diastolic dysfunction, mild aortic stenosis, trivial AI, MAC, moderate MR, mild LAE, moderate to severe TR, moderately increased PASP.  [name] Hx of non-ST elevation myocardial infarction (NSTEMI)    a. in setting of influenza, volume overload 08/2013 => Lexiscan Myoview (09/11/2013): Subtle ischemia in the anteroseptal wall, EF 44%. => med Rx recommended  . Hyperlipidemia   . Hypertension   . Shortness of breath    with exertion    Past Surgical History:  Procedure Laterality Date  . ANTERIOR APPROACH HEMI HIP ARTHROPLASTY Left 03/18/2017   Procedure: ANTERIOR APPROACH HEMI HIP ARTHROPLASTY LEFT;  Surgeon: Leandrew Koyanagi, MD;  Location: [place];  Service:  Orthopedics;  Laterality: Left;  . AV FISTULA PLACEMENT  2008   Left Upper Arm  . COLONOSCOPY    . EYE SURGERY Bilateral    cataract surgery  . LAPAROSCOPIC GASTROTOMY W/ REPAIR OF ULCER    . REVISON OF ARTERIOVENOUS FISTULA Left 11/21/2013   Procedure: REVISION OF ARTERIOVENOUS FISTULA;  Surgeon: Angelia Mould, MD;  Location: [place];  Service: Vascular;  Laterality: Left;  . REVISON OF ARTERIOVENOUS FISTULA Left 02/18/2016   Procedure: excision of ulcerated skin over left brachio-cephalic AV fistula x 2;  Surgeon: [name] Misty, MD;  Location: [place];  Service: Vascular;  Laterality: Left;  . TOE AMPUTATION Right    all toes    Family History  Problem Relation Age of Onset  . Cancer Mother   . Hypertension Mother   . Cancer Father   . Diabetes Father   . Hypertension Father   . Diabetes Son   . Diabetes Daughter   . Diabetes Daughter   . Diabetes Cousin     Social History:  reports that  has never smoked. he has never used smokeless tobacco. He reports that he does not drink alcohol or use drugs.  Allergies: No Known Allergies  Medications: I have reviewed the patient's current medications.  Results for orders placed or performed during the hospital encounter of 08/22/2017 (from the past 48 hour(s))  CBG monitoring, ED     Status: Abnormal   Collection Time:  09/02/2017  5:21 PM  Result Value Ref Range   Glucose-Capillary 18 (LL) 65 - 99 mg/dL   Comment 1 Notify RN   CBG monitoring, ED     Status: Abnormal   Collection Time: 09/18/2017  5:46 PM  Result Value Ref Range   Glucose-Capillary 44 (LL) 65 - 99 mg/dL  Comprehensive metabolic panel     Status: Abnormal   Collection Time: 09/02/2017  6:02 PM  Result Value Ref Range   Sodium 137 135 - 145 mmol/L   Potassium 4.2 3.5 - 5.1 mmol/L   Chloride 102 101 - 111 mmol/L   CO2 23 22 - 32 mmol/L   Glucose, Bld 199 (H) 65 - 99 mg/dL   BUN 6 6 - 20 mg/dL   Creatinine, Ser 1.58 (H) 0.61 - 1.24 mg/dL   Calcium 7.0 (L) 8.9 - 10.3  mg/dL   Total Protein 4.5 (L) 6.5 - 8.1 g/dL   Albumin 1.2 (L) 3.5 - 5.0 g/dL   AST 47 (H) 15 - 41 U/L   ALT 40 17 - 63 U/L   Alkaline Phosphatase 151 (H) 38 - 126 U/L   Total Bilirubin 0.9 0.3 - 1.2 mg/dL   GFR calc non Af Amer 37 (L) >60 mL/min   GFR calc Af Amer 43 (L) >60 mL/min    Comment: (NOTE) The eGFR has been calculated using the CKD EPI equation. This calculation has not been validated in all clinical situations. eGFR's persistently <60 mL/min signify possible Chronic Kidney Disease.    Anion gap 12 5 - 15  CBC WITH DIFFERENTIAL     Status: Abnormal   Collection Time: 09/07/2017  6:02 PM  Result Value Ref Range   WBC 20.9 (H) 4.0 - 10.5 K/uL   RBC 2.82 (L) 4.22 - 5.81 MIL/uL   Hemoglobin 8.5 (L) 13.0 - 17.0 g/dL   HCT 26.5 (L) 39.0 - 52.0 %   MCV 94.0 78.0 - 100.0 fL   MCH 30.1 26.0 - 34.0 pg   MCHC 32.1 30.0 - 36.0 g/dL   RDW 19.9 (H) 11.5 - 15.5 %   Platelets 174 150 - 400 K/uL   Neutrophils Relative % 88 %   Neutro Abs 18.2 (H) 1.7 - 7.7 K/uL   Lymphocytes Relative 7 %   Lymphs Abs 1.5 0.7 - 4.0 K/uL   Monocytes Relative 5 %   Monocytes Absolute 1.1 (H) 0.1 - 1.0 K/uL   Eosinophils Relative 0 %   Eosinophils Absolute 0.0 0.0 - 0.7 K/uL   Basophils Relative 0 %   Basophils Absolute 0.0 0.0 - 0.1 K/uL  CBG monitoring, ED     Status: None   Collection Time: 09/15/2017  6:03 PM  Result Value Ref Range   Glucose-Capillary 90 65 - 99 mg/dL  I-Stat Troponin, ED (not at [facility])     Status: Abnormal   Collection Time: 09/02/2017  6:09 PM  Result Value Ref Range   Troponin i, poc 0.29 (HH) 0.00 - 0.08 ng/mL   Comment NOTIFIED PHYSICIAN    Comment 3            Comment: Due to the release kinetics of cTnI, a negative result within the first hours of the onset of symptoms does not rule out myocardial infarction with certainty. If myocardial infarction is still suspected, repeat the test at appropriate intervals.   I-Stat CG4 Lactic Acid, ED  (not at  [facility])     Status:  Abnormal   Collection Time: 09/05/2017  6:11 PM  Result Value Ref Range   Lactic Acid, Venous 7.49 (HH) 0.5 - 1.9 mmol/L   Comment NOTIFIED PHYSICIAN   I-Stat Arterial Blood Gas, ED - (order at [facility] and [facility] only)     Status: Abnormal   Collection Time: 08/30/2017  6:46 PM  Result Value Ref Range   pH, Arterial 7.566 (H) 7.350 - 7.450   pCO2 arterial 25.9 (L) 32.0 - 48.0 mmHg   pO2, Arterial 133.0 (H) 83.0 - 108.0 mmHg   Bicarbonate 23.5 20.0 - 28.0 mmol/L   TCO2 24 22 - 32 mmol/L   O2 Saturation 99.0 %   Acid-Base Excess 2.0 0.0 - 2.0 mmol/L   Patient temperature HIDE    Collection site RADIAL, ALLEN'S TEST ACCEPTABLE    Drawn by RT    Sample type ARTERIAL   CBG monitoring, ED     Status: Abnormal   Collection Time: 09/08/2017  7:00 PM  Result Value Ref Range   Glucose-Capillary 184 (H) 65 - 99 mg/dL   Comment 1 Notify RN    Comment 2 Document in Chart   I-Stat CG4 Lactic Acid, ED  (not at  [facility])     Status: Abnormal   Collection Time: 09/14/2017  7:36 PM  Result Value Ref Range   Lactic Acid, Venous 7.33 (HH) 0.5 - 1.9 mmol/L   Comment NOTIFIED PHYSICIAN   CBG monitoring, ED     Status: Abnormal   Collection Time: 09/18/2017  7:49 PM  Result Value Ref Range   Glucose-Capillary 118 (H) 65 - 99 mg/dL   Comment 1 Notify RN     Dg Chest Port 1 View  Result Date: 08/30/2017 CLINICAL DATA:  83 year old male with altered mental status. EXAM: PORTABLE CHEST 1 VIEW COMPARISON:  Chest radiograph dated 03/21/2017 FINDINGS: Right IJ dialysis catheter with tip over the right atrium in similar position. There are minimal bibasilar atelectatic changes. No focal consolidation, pleural effusion, or pneumothorax. Stable cardiomegaly. Prominence of the central pulmonary arteries suggestive of underlying pulmonary hypertension. There is atherosclerotic calcification of the aortic arch. No acute osseous pathology. IMPRESSION: 1. Minimal bibasilar atelectasis.  No focal consolidation 2. Findings likely  suggest a degree of pulmonary hypertension. Clinical correlation is recommended. 3. Stable cardiomegaly.  Dialysis catheter in similar position. Electronically Signed   By: Anner Crete M.D.   On: 09/09/2017 18:14    ROS Blood pressure 94/74, temperature (!) 97.2 F (36.2 C), temperature source Rectal, resp. rate (!) 24, height _0  (1.651 m), weight 120 lb (54.4 kg). Physical Exam  Musculoskeletal:       Legs: Neurological: He is alert.    Assessment/Plan: Gangrenous in the right leg with sepsis.  The family would like to discuss amongst themselves whether or not to put him through such a significant operation.  He is a very sickly individual who is on dialysis.  He is requiring pressors for support right now.  We had a long and thorough discussion about the potential for just comfort care/palliative care due to the fact that even a large operation such as an above-knee amputation may not prolong his life without undue suffering.  This is a decision only the family can make.  For now I will put him on the operating room schedule for tomorrow afternoon (Monday 12-24) and will let the family meet is a group to discuss what they feel may be in his best interest given his cyclic condition and dementia.  Mcarthur Rossetti 08/19/2017, 8:58 PM

## 2017-09-10 NOTE — H&P (Signed)
PULMONARY / CRITICAL CARE MEDICINE   Name: Steven Forbes MRN: [id number] DOB: 1926/01/18    ADMISSION DATE:  08/26/2017 CONSULTATION DATE:  09/07/2017  REFERRING MD:  Dr. Ericka Pontiff   CHIEF COMPLAINT:  AMS   HISTORY OF PRESENT ILLNESS:   84 year old male with extensive PMH including dementia, DM, ESRD on HD M/W/F, GERD, Anemia, HLD, HTN, Aortic Stenosis, right transmetatarsal amputation, and known gangrenous right leg  Presents to ED from home with progressive lethargy. Per family patient was scheduled January 2 for AKA of right leg. Presented hypotensive, LA 7.49, given 2 L Bolus, Vancomycin/Zosyn and started on Levophed Gtt. PCCM called for admission.    Orthopedics was consulted. Agree with surgery 12/24. However, have discussed with family high risk due to multiple co-morbidites and current state of health.  PAST MEDICAL HISTORY :  He  has a past medical history of Anemia, Asthma, Dementia, Diabetes mellitus, ESRD (end stage renal disease) ([place]), Flu (08/2014), GERD (gastroesophageal reflux disease), History of blood transfusion, HOH (hard of hearing), echocardiogram, non-ST elevation myocardial infarction (NSTEMI), Hyperlipidemia, Hypertension, and Shortness of breath.  PAST SURGICAL HISTORY: He  has a past surgical history that includes Laparoscopic gastrotomy w/ repair of ulcer; AV fistula placement (2008); Toe amputation (Right); Revison of arteriovenous fistula (Left, 11/21/2013); Eye surgery (Bilateral); Colonoscopy; Revison of arteriovenous fistula (Left, 02/18/2016); and Anterior approach hemi hip arthroplasty (Left, 03/18/2017).  No Known Allergies  No current facility-administered medications on file prior to encounter.    Current Outpatient Medications on File Prior to Encounter  Medication Sig  . acetaminophen (TYLENOL) 500 MG tablet Take 500 mg by mouth at bedtime.  [name] albuterol (PROAIR HFA) 108 (90 Base) MCG/ACT inhaler Inhale 2 puffs into the lungs 2 (two) times daily.  [name]  atorvastatin (LIPITOR) 80 MG tablet Take 80 mg by mouth at bedtime.   . [facility] ASPIRIN LOW DOSE 81 MG EC tablet TAKE 1 TABLET (81 MG TOTAL) BY MOUTH DAILY. NEED OV. (Patient taking differently: Take 81 mg by mouth daily. )  . doxycycline (VIBRA-TABS) 100 MG tablet TAKE 1 TABLET BY MOUTH TWICE A DAY (Patient taking differently: TAKE 1 TABLET (100 MG) BY MOUTH TWICE A DAY (#30 FILLED 08/29/17))  . ENSURE (ENSURE) Take 237 mLs by mouth daily.  . multivitamin (RENA-VIT) TABS tablet Take 1 tablet by mouth at bedtime.  [name] omeprazole (PRILOSEC) 20 MG capsule Take 1 capsule (20 mg total) by mouth daily. (Patient taking differently: Take 20 mg by mouth at bedtime. )  . acetaminophen (TYLENOL) 325 MG tablet Take 2 tablets (650 mg total) by mouth every 6 (six) hours as needed for mild pain (or Fever >/= 101). (Patient not taking: Reported on 08/25/2017)  . darbepoetin (ARANESP) 100 MCG/0.5ML SOLN injection Inject 0.5 mLs (100 mcg total) into the vein every Monday with hemodialysis.  [name] doxercalciferol (HECTOROL) 4 MCG/2ML injection Inject 1 mL (2 mcg total) into the vein every Monday, Wednesday, and Friday with hemodialysis.  . feeding supplement (BOOST / RESOURCE BREEZE) LIQD Take 1 Container by mouth 3 (three) times daily between meals. (Patient not taking: Reported on 09/01/2017)  . HYDROcodone-acetaminophen (NORCO) 7.5-325 MG tablet Take 1-2 tablets by mouth every 6 (six) hours as needed for moderate pain. (Patient not taking: Reported on 09/04/2017)  . methocarbamol (ROBAXIN) 500 MG tablet Take 1 tablet (500 mg total) by mouth every 6 (six) hours as needed for muscle spasms. (Patient not taking: Reported on 09/08/2017)  . silver sulfADIAZINE (SILVADENE) 1 % cream Apply 1  application topically daily. (Patient not taking: Reported on 08/28/2017)    FAMILY HISTORY:  His indicated that his mother is deceased. He indicated that his father is deceased. He indicated that the status of his son is unknown. He indicated  that the status of his cousin is unknown.   SOCIAL HISTORY: He  reports that  has never smoked. he has never used smokeless tobacco. He reports that he does not drink alcohol or use drugs.  REVIEW OF SYSTEMS:   Unable to review as patient is encephalopathic   SUBJECTIVE:   VITAL SIGNS: BP (!) 86/60   Temp 97.6 F (36.4 C) (Rectal)   Resp (!) 32   Ht 5\' 5"  (1.651 m)   Wt 51.6 kg (113 lb 12.1 oz)   SpO2 100%   BMI 18.93 kg/m   HEMODYNAMICS:    VENTILATOR SETTINGS:    INTAKE / OUTPUT: No intake/output data recorded.  PHYSICAL EXAMINATION: General:  Chronically ill elderly male, no distress  Neuro:  Confused, alert, agitated  HEENT:  Dry MM  Cardiovascular:  Tachy, no MRG  Lungs:  Clear breath sounds, no wheeze/crackles  Abdomen:  Non-distended, active bowel sounds  Musculoskeletal:  -edema  Skin:  gangrenous right leg with previous toe amputations, chronic vascular insufficiency    LABS:  BMET Recent Labs  Lab 09/07/2017 1802  NA 137  K 4.2  CL 102  CO2 23  BUN 6  CREATININE 1.58*  GLUCOSE 199*    Electrolytes Recent Labs  Lab 08/25/2017 1802  CALCIUM 7.0*    CBC Recent Labs  Lab 09/07/2017 1802  WBC 20.9*  HGB 8.5*  HCT 26.5*  PLT 174    Coag's No results for input(s): APTT, INR in the last 168 hours.  Sepsis Markers Recent Labs  Lab 09/09/2017 1811 09/02/2017 1936  LATICACIDVEN 7.49* 7.33*    ABG Recent Labs  Lab 08/27/2017 1846  PHART 7.566*  PCO2ART 25.9*  PO2ART 133.0*    Liver Enzymes Recent Labs  Lab 09/03/2017 1802  AST 47*  ALT 40  ALKPHOS 151*  BILITOT 0.9  ALBUMIN 1.2*    Cardiac Enzymes No results for input(s): TROPONINI, PROBNP in the last 168 hours.  Glucose Recent Labs  Lab 09/04/2017 1721 09/05/2017 1746 09/06/2017 1803 08/30/2017 1900 09/15/2017 1949 09/15/2017 2221  GLUCAP 18* 44* 90 184* 118* 121*    Imaging Dg Chest Port 1 View  Result Date: 09/01/2017 CLINICAL DATA:  83 year old male with altered mental  status. EXAM: PORTABLE CHEST 1 VIEW COMPARISON:  Chest radiograph dated 03/21/2017 FINDINGS: Right IJ dialysis catheter with tip over the right atrium in similar position. There are minimal bibasilar atelectatic changes. No focal consolidation, pleural effusion, or pneumothorax. Stable cardiomegaly. Prominence of the central pulmonary arteries suggestive of underlying pulmonary hypertension. There is atherosclerotic calcification of the aortic arch. No acute osseous pathology. IMPRESSION: 1. Minimal bibasilar atelectasis.  No focal consolidation 2. Findings likely suggest a degree of pulmonary hypertension. Clinical correlation is recommended. 3. Stable cardiomegaly.  Dialysis catheter in similar position. Electronically Signed   By: Anner Crete M.D.   On: 09/01/2017 18:14     STUDIES:  CXR 12/23 > Minimal bibasilar atelectasis.  No focal consolidation 2. Findings likely suggest a degree of pulmonary hypertension. Clinical correlation is recommended. 3. Stable cardiomegaly.  Dialysis catheter in similar position.  CULTURES: Blood 12/23 >>  ANTIBIOTICS: Vancomycin 12/23 >> Zosyn 12/23 >>   SIGNIFICANT EVENTS: 12/23 > Presents to ED   LINES/TUBES: PIV  DISCUSSION: 83 year old male with known gangrenous right leg, seen by Dr. Sharol Given in office, planned above knee amputation January 2. Presents to ED septic and lethargic. Hypotensive, LA 7.49, Troponin 0.38, WBC 20.9. Full DNR. Started on Levophed and STAT ortho consult.       ASSESSMENT / PLAN:  PULMONARY A: Tachypnea in setting of septic shock  P:   Full DNR, no intubation  Maintain Oxygen Saturation >92 Trend CXR  CARDIOVASCULAR A:  Septic Shock in setting of gangrenous right leg Demand Ischemia  Troponin 0.29 >  Aortic Stenosis (EF on 03/17/17 50-55)  H/O HLD, HTN,  PVD S/P transmetatarsal amputation of right foot   P:  Cardiac Monitoring  Wean levophed to maintain MAP >65 (If requirements increase will place  CVC Trend Troponin Hold home Lipitor until patient can take PO meds   RENAL A:   Lactic Acidosis in setting of septic shock  ESRD, HD M/W/F  P:   Trend BMP  Replace Electrolytes as indicated  Trend LA  Patient had HD 12/22 as office is close Monday for holiday > no indication of HD at this time   GASTROINTESTINAL A:   GERD Protein Calorie Malnutrition  P:   NPO for possible surgery and in setting of encephalopathy  PPI   HEMATOLOGIC A:   Anemia of Chronic disease   P:  Trend CBC  Maintain Hbg > 7   INFECTIOUS A:   Septic Shock in setting of gangrenous right leg  -Followed by Dr. Sharol Given in office  P:   Trend WBC and Fever Curve  Trend PCT and LA  Follow Culture Data  Vancomycin and Zosyn  Orthopedics consulted > Will preform tomorrow if family wants to proceed   ENDOCRINE A:   DM    P:   Trend Glucose  SSI   NEUROLOGIC A:   Metabolic Encephalopathy  H/O Dementia  P:   Monitor  Ativan PRN  Low dose Fentanyl PRN    FAMILY  - Updates: Family updated. Extensive conversation regarding goals of care. Family unsure if they want to proceed with comfort care vs surgery.    - Inter-disciplinary family meet or Palliative Care meeting due by: 09/17/2017   CC Time: 25 minutes  Hayden Pedro, AGACNP-BC [place]  Pgr: [phone]  PCCM Pgr: [phone]

## 2017-09-10 NOTE — ED Triage Notes (Signed)
Pt arrived [facility] from a dialysis center where he became hypotensive, altered, and hypoglycemic. Initial CBG was 47 EMS unable to get IV access given IM glucagon  BP 90/50 P 120s-130 afib RVR RR O2sat ?

## 2017-09-10 NOTE — ED Notes (Signed)
Dr. Ninfa Linden at the bedside.

## 2017-09-10 NOTE — ED Notes (Signed)
Sepsis activated @ 1813 (RN aware)

## 2017-09-10 NOTE — Progress Notes (Signed)
CRITICAL VALUE ALERT  Critical Value:  Lactic acid 7.4 troponin 0.38  Date & Time Notied:  08/22/2017 2309  Provider Notified: Hayden Pedro NP  Orders Received/Actions taken: no new orders at this time

## 2017-09-10 NOTE — ED Provider Notes (Signed)
[facility] EMERGENCY DEPARTMENT Provider Note   CSN: [id number] Arrival date & time: 09/15/2017  1712     History   Chief Complaint Chief Complaint  Patient presents with  . Hypoglycemia  . Atrial Fibrillation  . Hypotension  . Altered Mental Status    HPI Steven Forbes is a 83 y.o. male.  The history is provided by the EMS personnel, medical records and the patient.  Hypoglycemia  Initial blood sugar:  42 w/EMS, IM glucagon given Blood sugar after intervention:  18 on arrival here, D50 given Severity:  Severe Onset quality:  Unable to specify Timing:  Unable to specify Progression:  Improving Chronicity: Unable to specify. Relieved by:  IV glucose Ineffective treatments:  Glucagon Associated symptoms comment:  Unable to provide reliable ROS Risk factors comment:  Dementia, ESRD on HD, gangrenous RLE   Past Medical History:  Diagnosis Date  . Anemia   . Asthma   . Dementia   . Diabetes mellitus    type 2  . ESRD (end stage renal disease) ([place])    Dialysis on MONDAY, [place] and FRIDAYS  . Flu 08/2014  . GERD (gastroesophageal reflux disease)   . History of blood transfusion   . HOH (hard of hearing)   . Hx of echocardiogram    a.  Echocardiogram (09/10/2013): EF 50-55%, normal wall motion, grade 2 diastolic dysfunction, mild aortic stenosis, trivial AI, MAC, moderate MR, mild LAE, moderate to severe TR, moderately increased PASP.  [name] Hx of non-ST elevation myocardial infarction (NSTEMI)    a. in setting of influenza, volume overload 08/2013 => Lexiscan Myoview (09/11/2013): Subtle ischemia in the anteroseptal wall, EF 44%. => med Rx recommended  . Hyperlipidemia   . Hypertension   . Shortness of breath    with exertion    Patient Active Problem List   Diagnosis Date Noted  . Atherosclerosis of native arteries of extremities with gangrene, right leg ([place]) 09/07/2017  . Ischemic leg ulcer ([place]) 08/24/2017  . Non-pressure chronic ulcer  of left heel and midfoot limited to breakdown of skin ([place]) 05/30/2017  . S/P transmetatarsal amputation of foot, right ([place]) 05/30/2017  . Left displaced femoral neck fracture ([place]) 03/17/2017  . Ulcer of right foot, limited to breakdown of skin ([place]) 03/16/2017  . Acute encephalopathy   . HCAP (healthcare-associated pneumonia) 01/17/2017  . Diabetes with neurologic complications ([place]) 09/98/3382  . Moderate dementia 01/21/2015  . Aortic stenosis 07/08/2014  . CAD (coronary artery disease) 01/07/2014  . Essential hypertension 01/07/2014  . Hyperlipidemia 01/07/2014  . End stage renal disease ([place]) 12/11/2013  . Protein-calorie malnutrition, severe ([place]) 09/11/2013  . Transaminitis 09/10/2013  . Respiratory distress 09/09/2013  . NSTEMI (non-ST elevated myocardial infarction) ([place]) 09/09/2013  . ESRD on dialysis ([place]) 09/09/2013  . possible Pneumonia 09/09/2013  . Influenza B 09/09/2013  . Acute CHF ([place]) 09/09/2013  . Acute myocardial infarction, subendocardial infarction, initial episode of care ([place]) 09/09/2013  . Secondary hyperparathyroidism ([place]) 09/09/2013  . Atherosclerotic peripheral vascular disease ([place]) 09/09/2013  . GASTRIC POLYP 12/08/2008  . HEMORRHOIDS-INTERNAL 12/08/2008  . DIABETES MELLITUS 11/25/2008  . ANEMIA 11/25/2008  . COLONIC POLYPS, HX OF 11/25/2008    Past Surgical History:  Procedure Laterality Date  . ANTERIOR APPROACH HEMI HIP ARTHROPLASTY Left 03/18/2017   Procedure: ANTERIOR APPROACH HEMI HIP ARTHROPLASTY LEFT;  Surgeon: Leandrew Koyanagi, MD;  Location: [facility];  Service: Orthopedics;  Laterality: Left;  . AV FISTULA PLACEMENT  2008   Left Upper  Arm  . COLONOSCOPY    . EYE SURGERY Bilateral    cataract surgery  . LAPAROSCOPIC GASTROTOMY W/ REPAIR OF ULCER    . REVISON OF ARTERIOVENOUS FISTULA Left 11/21/2013   Procedure: REVISION OF ARTERIOVENOUS FISTULA;  Surgeon: Angelia Mould, MD;  Location: [facility];  Service: Vascular;  Laterality: Left;  .  REVISON OF ARTERIOVENOUS FISTULA Left 02/18/2016   Procedure: excision of ulcerated skin over left brachio-cephalic AV fistula x 2;  Surgeon: [name] Misty, MD;  Location: [facility];  Service: Vascular;  Laterality: Left;  . TOE AMPUTATION Right    all toes       Home Medications    Prior to Admission medications   Medication Sig Start Date End Date Taking? Authorizing Provider  acetaminophen (TYLENOL) 325 MG tablet Take 2 tablets (650 mg total) by mouth every 6 (six) hours as needed for mild pain (or Fever >/= 101). 03/21/17   Bonnielee Haff, MD  atorvastatin (LIPITOR) 80 MG tablet Take 80 mg by mouth daily.  09/23/13   [provider]  calcium acetate (PHOSLO) 667 MG capsule Take 667 mg by mouth 3 (three) times daily with meals.  07/10/16   [provider]  [facility] ASPIRIN LOW DOSE 81 MG EC tablet TAKE 1 TABLET (81 MG TOTAL) BY MOUTH DAILY. NEED OV. 11/28/16   Lelon Perla, MD  darbepoetin (ARANESP) 100 MCG/0.5ML SOLN injection Inject 0.5 mLs (100 mcg total) into the vein every Monday with hemodialysis. 09/13/13   Charlynne Cousins, MD  doxercalciferol (HECTOROL) 4 MCG/2ML injection Inject 1 mL (2 mcg total) into the vein every Monday, Wednesday, and Friday with hemodialysis. 03/22/17   Bonnielee Haff, MD  doxycycline (VIBRA-TABS) 100 MG tablet TAKE 1 TABLET BY MOUTH TWICE A DAY 08/29/17   Newt Minion, MD  enoxaparin (LOVENOX) 30 MG/0.3ML injection Inject 0.3 mLs (30 mg total) into the skin daily. 03/18/17   Leandrew Koyanagi, MD  feeding supplement (BOOST / RESOURCE BREEZE) LIQD Take 1 Container by mouth 3 (three) times daily between meals. 03/21/17   Bonnielee Haff, MD  HYDROcodone-acetaminophen (NORCO) 7.5-325 MG tablet Take 1-2 tablets by mouth every 6 (six) hours as needed for moderate pain. 03/18/17   Leandrew Koyanagi, MD  memantine (NAMENDA) 5 MG tablet Take 5 mg by mouth daily.  12/23/14   [provider]  methocarbamol (ROBAXIN) 500 MG tablet Take 1 tablet (500 mg  total) by mouth every 6 (six) hours as needed for muscle spasms. 03/21/17   Bonnielee Haff, MD  omeprazole (PRILOSEC) 20 MG capsule Take 1 capsule (20 mg total) by mouth daily. 01/19/17   Reyne Dumas, MD  PROAIR HFA 108 (90 Base) MCG/ACT inhaler Inhale 2 puffs into the lungs 3 (three) times daily.  12/14/15   [provider]  risperiDONE (RISPERDAL) 0.5 MG tablet Take 1 mg by mouth at bedtime.  01/13/15   [provider]  silver sulfADIAZINE (SILVADENE) 1 % cream Apply 1 application topically daily. 08/24/17   Suzan Slick, NP    Family History Family History  Problem Relation Age of Onset  . Cancer Mother   . Hypertension Mother   . Cancer Father   . Diabetes Father   . Hypertension Father   . Diabetes Son   . Diabetes Daughter   . Diabetes Daughter   . Diabetes Cousin     Social History Social History   Tobacco Use  . Smoking status: Never Smoker  . Smokeless tobacco:  Never Used  Substance Use Topics  . Alcohol use: No    Alcohol/week: 0.0 oz  . Drug use: No     Allergies   Patient has no known allergies.   Review of Systems Review of Systems  Unable to perform ROS: Dementia     Physical Exam Updated Vital Signs BP (!) 76/46   Temp (!) 97.2 F (36.2 C) (Rectal)   Resp (!) 25   Ht _0  (1.651 m)   Wt 54.4 kg (120 lb)   BMI 19.97 kg/m   Physical Exam  Constitutional: He appears well-developed and well-nourished.  HENT:  Head: Normocephalic and atraumatic.  Eyes: Conjunctivae are normal. Pupils are equal, round, and reactive to light.  Neck: Normal range of motion.  Cardiovascular:  No murmur heard. Tachycardic w/irregularly irregular rhythm, LUE fistula w/palpable thrill, right chest HD catheter  Pulmonary/Chest: Breath sounds normal. No respiratory distress.  Tachypnea  Abdominal: Soft. There is no tenderness.  Musculoskeletal: He exhibits no edema.  Gangrenous RLE from the mid-shin distally, right foot cold & w/serosanguinous  drainage from several spots, LLE w/2+ pitting edema to the knee  Neurological: He is alert.  Oriented to person & birthday, answering questions appropriately, moving all 4ext  Skin: Skin is warm and dry.  Psychiatric: He has a normal mood and affect.  Nursing note and vitals reviewed.    ED Treatments / Results  Labs (all labs ordered are listed, but only abnormal results are displayed) Labs Reviewed  CBG MONITORING, ED - Abnormal; Notable for the following components:      Result Value   Glucose-Capillary 18 (*)    All other components within normal limits    EKG  EKG Interpretation  Date/Time:  Sunday September 10 2017 17:24:47 EST Ventricular Rate:  150 PR Interval:    QRS Duration: 151 QT Interval:  347 QTC Calculation: 549 R Axis:   -151 Text Interpretation:  Extreme tachycardia with wide complex, no further rhythm analysis attempted RBBB Baseline wander in lead(s) V4 Confirmed by Elnora Morrison [phone]) on 09/04/2017 5:30:38 PM       Radiology No results found.  Procedures Procedures (including critical care time)  Medications Ordered in ED Medications  vancomycin (VANCOCIN) IVPB 1000 mg/200 mL premix (1,000 mg Intravenous New Bag/Given 09/01/2017 1833)  piperacillin-tazobactam (ZOSYN) IVPB 3.375 g (3.375 g Intravenous New Bag/Given 08/28/2017 1833)  0.9 %  sodium chloride infusion (not administered)  dextrose 50 % solution (50 mLs  Given 09/18/2017 1749)  dextrose 50 % solution (50 mLs  Given 08/21/2017 1720)  norepinephrine (LEVOPHED) 4 mg in dextrose 5 % 250 mL (0.016 mg/mL) infusion (5 mcg/min Intravenous New Bag/Given 09/07/2017 1834)     Initial Impression / Assessment and Plan / ED Course  I have reviewed the triage vital signs and the nursing notes.  Pertinent labs & imaging results that were available during my care of the patient were reviewed by me and considered in my medical decision making (see chart for details).     Pt with h/o CAD, HTN, DM, AS,  PVD, ESRD (HD on MWF), advanced dementia, gangrenous RLE (family advised he needed emergent AKA on 12/20, but they wanted to wait until after Xmas) presents from the dialysis center w/HoTN, AMS, and hypoglycemia. EMS reports that the Pt was "weak" per family when he went to HD, but his mental status was normal. Near the end of his session he became hypotensive, but the techs managed to complete the treatment and reportedly "gave  him some fluid back." He was more lethargic and confused after his treatment, so EMS was called; on arrival they found his FSBG to be 47, so gave him IM glucagon approximately 44mns PTA. Medics say he was in Afib w/RVR on their monitors w/BP 90s/50s. DNR paperwork accompanied the Pt.  FSBG 18; amp of D50 given w/near immediate improvement in mental status. Repeat FSBG 44, so additional amp given w/increase to the 90s.  VS & exam as above. CODE SEPSIS initiated on arrival given history & RLE appearance. EKG: Afib w/RVR @ 150 & right axis; will avoid rate control medications given HoTN.  LA 7.49; elected for 1L bolus instead of 30cc/kg given his ESRD. Empiric V&Z ordered. Levophed gtt ordered for persistent HoTN.  CXR w/o focal consolidation, pleural effusion, PTX.  Family arrived. Confirmed w/the Pt's wife that pressors & intubation are okay, but she does not want him to receive chest compressions.   Labs remarkable for WBC 20.9, troponin 0.29.  Intensivist consulted and will admit the Pt to the ICU; they will call orthopedics to discuss amputation.  Final Clinical Impressions(s) / ED Diagnoses   Final diagnoses:  Septic shock ([facility]    ED Discharge Orders    None       PJenny Reichmann MD 09/16/2017 1Earney Navy   ZElnora Morrison MD 08/30/2017 [id number]

## 2017-09-10 NOTE — ED Notes (Signed)
1L LR bolus given per admitting MD.

## 2017-09-10 NOTE — ED Notes (Signed)
Pt CBG was 184, notified Dr. Reather Converse

## 2017-09-10 NOTE — ED Notes (Signed)
12.5mg  fentanyl IV given per Louretta Parma, NP.

## 2017-09-10 NOTE — ED Notes (Signed)
Pt CBG was 90 notify Journalist, newspaper

## 2017-09-10 NOTE — ED Notes (Signed)
Pt has a necrotic right foot extending to mid leg it is black with multiple areas of tissue sloughing. Pt family states that he was seen for it at the orthopedic doctor and they recommended amputation and it was scheduled for September 27, 2017.

## 2017-09-11 ENCOUNTER — Encounter (HOSPITAL_COMMUNITY): Admission: EM | Disposition: E | Payer: Self-pay | Source: Home / Self Care | Attending: Pulmonary Disease

## 2017-09-11 ENCOUNTER — Inpatient Hospital Stay (HOSPITAL_COMMUNITY): Payer: Medicare Other | Admitting: Anesthesiology

## 2017-09-11 ENCOUNTER — Inpatient Hospital Stay (HOSPITAL_COMMUNITY): Payer: Medicare Other

## 2017-09-11 DIAGNOSIS — E440 Moderate protein-calorie malnutrition: Secondary | ICD-10-CM

## 2017-09-11 DIAGNOSIS — I70261 Atherosclerosis of native arteries of extremities with gangrene, right leg: Secondary | ICD-10-CM

## 2017-09-11 DIAGNOSIS — A419 Sepsis, unspecified organism: Secondary | ICD-10-CM

## 2017-09-11 DIAGNOSIS — R6521 Severe sepsis with septic shock: Secondary | ICD-10-CM

## 2017-09-11 DIAGNOSIS — Z515 Encounter for palliative care: Secondary | ICD-10-CM

## 2017-09-11 HISTORY — PX: AMPUTATION: SHX166

## 2017-09-11 LAB — BASIC METABOLIC PANEL
Anion gap: 12 (ref 5–15)
BUN: 6 mg/dL (ref 6–20)
CALCIUM: 7.1 mg/dL — AB (ref 8.9–10.3)
CO2: 23 mmol/L (ref 22–32)
CREATININE: 1.53 mg/dL — AB (ref 0.61–1.24)
Chloride: 102 mmol/L (ref 101–111)
GFR calc non Af Amer: 39 mL/min — ABNORMAL LOW (ref >60)
GFR, EST AFRICAN AMERICAN: 45 mL/min — AB (ref >60)
Glucose, Bld: 149 mg/dL — ABNORMAL HIGH (ref 65–99)
Potassium: 3.4 mmol/L — ABNORMAL LOW (ref 3.5–5.1)
SODIUM: 137 mmol/L (ref 135–145)

## 2017-09-11 LAB — POCT I-STAT 7, (LYTES, BLD GAS, ICA,H+H)
Acid-base deficit: 1 mmol/L (ref 0.0–2.0)
Bicarbonate: 20.9 mmol/L (ref 20.0–28.0)
Calcium, Ion: 0.92 mmol/L — ABNORMAL LOW (ref 1.15–1.40)
HCT: 26 % — ABNORMAL LOW (ref 39.0–52.0)
HEMOGLOBIN: 8.8 g/dL — AB (ref 13.0–17.0)
O2 Saturation: 100 %
PCO2 ART: 25.6 mmHg — AB (ref 32.0–48.0)
PO2 ART: 436 mmHg — AB (ref 83.0–108.0)
Potassium: 3.6 mmol/L (ref 3.5–5.1)
Sodium: 134 mmol/L — ABNORMAL LOW (ref 135–145)
TCO2: 22 mmol/L (ref 22–32)
pH, Arterial: 7.518 — ABNORMAL HIGH (ref 7.350–7.450)

## 2017-09-11 LAB — CBC
HCT: 28.5 % — ABNORMAL LOW (ref 39.0–52.0)
Hemoglobin: 9.2 g/dL — ABNORMAL LOW (ref 13.0–17.0)
MCH: 30.1 pg (ref 26.0–34.0)
MCHC: 32.3 g/dL (ref 30.0–36.0)
MCV: 93.1 fL (ref 78.0–100.0)
PLATELETS: 178 10*3/uL (ref 150–400)
RBC: 3.06 MIL/uL — AB (ref 4.22–5.81)
RDW: 19.7 % — ABNORMAL HIGH (ref 11.5–15.5)
WBC: 30.8 10*3/uL — AB (ref 4.0–10.5)

## 2017-09-11 LAB — GLUCOSE, CAPILLARY
GLUCOSE-CAPILLARY: 123 mg/dL — AB (ref 65–99)
GLUCOSE-CAPILLARY: 131 mg/dL — AB (ref 65–99)
GLUCOSE-CAPILLARY: 76 mg/dL (ref 65–99)
Glucose-Capillary: 106 mg/dL — ABNORMAL HIGH (ref 65–99)
Glucose-Capillary: 114 mg/dL — ABNORMAL HIGH (ref 65–99)
Glucose-Capillary: 140 mg/dL — ABNORMAL HIGH (ref 65–99)
Glucose-Capillary: 67 mg/dL (ref 65–99)

## 2017-09-11 LAB — POCT I-STAT 3, ART BLOOD GAS (G3+)
ACID-BASE DEFICIT: 1 mmol/L (ref 0.0–2.0)
BICARBONATE: 21.7 mmol/L (ref 20.0–28.0)
O2 SAT: 100 %
TCO2: 23 mmol/L (ref 22–32)
pCO2 arterial: 28.6 mmHg — ABNORMAL LOW (ref 32.0–48.0)
pH, Arterial: 7.488 — ABNORMAL HIGH (ref 7.350–7.450)
pO2, Arterial: 408 mmHg — ABNORMAL HIGH (ref 83.0–108.0)

## 2017-09-11 LAB — PHOSPHORUS: Phosphorus: 1.6 mg/dL — ABNORMAL LOW (ref 2.5–4.6)

## 2017-09-11 LAB — TROPONIN I: TROPONIN I: 0.36 ng/mL — AB (ref <0.03)

## 2017-09-11 LAB — MRSA PCR SCREENING: MRSA by PCR: NEGATIVE

## 2017-09-11 LAB — PREPARE RBC (CROSSMATCH)

## 2017-09-11 LAB — MAGNESIUM: MAGNESIUM: 1.6 mg/dL — AB (ref 1.7–2.4)

## 2017-09-11 LAB — LACTIC ACID, PLASMA: LACTIC ACID, VENOUS: 5 mmol/L — AB (ref 0.5–1.9)

## 2017-09-11 SURGERY — AMPUTATION, ABOVE KNEE
Anesthesia: General | Site: Leg Upper | Laterality: Right

## 2017-09-11 MED ORDER — FENTANYL CITRATE (PF) 100 MCG/2ML IJ SOLN
50.0000 ug | INTRAMUSCULAR | Status: DC | PRN
  Filled 2017-09-11: qty 2

## 2017-09-11 MED ORDER — ETOMIDATE 2 MG/ML IV SOLN
0.3000 mg/kg | Freq: Once | INTRAVENOUS | Status: AC
  Administered 2017-09-11: 15 mg via INTRAVENOUS

## 2017-09-11 MED ORDER — 0.9 % SODIUM CHLORIDE (POUR BTL) OPTIME
TOPICAL | Status: DC | PRN
  Administered 2017-09-11: 1000 mL

## 2017-09-11 MED ORDER — NOREPINEPHRINE BITARTRATE 1 MG/ML IV SOLN
0.0000 ug/min | INTRAVENOUS | Status: DC
  Administered 2017-09-11: 12 ug/min via INTRAVENOUS
  Administered 2017-09-12: 20 ug/min via INTRAVENOUS
  Administered 2017-09-13: 8 ug/min via INTRAVENOUS
  Administered 2017-09-14: 12 ug/min via INTRAVENOUS
  Administered 2017-09-15: 20 ug/min via INTRAVENOUS
  Administered 2017-09-15: 2 ug/min via INTRAVENOUS
  Administered 2017-09-16: 10 ug/min via INTRAVENOUS
  Administered 2017-09-18: 8 ug/min via INTRAVENOUS
  Filled 2017-09-11 (×6): qty 16

## 2017-09-11 MED ORDER — SODIUM CHLORIDE 0.9 % IV SOLN: 100.0000 mL | INTRAVENOUS | Status: DC | PRN

## 2017-09-11 MED ORDER — MIDAZOLAM HCL 2 MG/2ML IJ SOLN
1.0000 mg | Freq: Once | INTRAMUSCULAR | Status: AC
  Administered 2017-09-11: 1 mg via INTRAVENOUS

## 2017-09-11 MED ORDER — CHLORHEXIDINE GLUCONATE 0.12 % MT SOLN: 15.0000 mL | Freq: Two times a day (BID) | OROMUCOSAL | Status: DC

## 2017-09-11 MED ORDER — FENTANYL CITRATE (PF) 250 MCG/5ML IJ SOLN
INTRAMUSCULAR | Status: AC
  Filled 2017-09-11: qty 5

## 2017-09-11 MED ORDER — FENTANYL CITRATE (PF) 100 MCG/2ML IJ SOLN
INTRAMUSCULAR | Status: AC
  Filled 2017-09-11: qty 2

## 2017-09-11 MED ORDER — MAGNESIUM SULFATE 2 GM/50ML IV SOLN
2.0000 g | Freq: Once | INTRAVENOUS | Status: AC
  Administered 2017-09-11: 2 g via INTRAVENOUS
  Filled 2017-09-11: qty 50

## 2017-09-11 MED ORDER — ORAL CARE MOUTH RINSE
15.0000 mL | Freq: Two times a day (BID) | OROMUCOSAL | Status: DC
  Administered 2017-09-11 (×2): 15 mL via OROMUCOSAL

## 2017-09-11 MED ORDER — MIDAZOLAM HCL 2 MG/2ML IJ SOLN
INTRAMUSCULAR | Status: AC
  Filled 2017-09-11: qty 2

## 2017-09-11 MED ORDER — SODIUM CHLORIDE 0.9 % IV SOLN
Freq: Once | INTRAVENOUS | Status: AC
  Administered 2017-09-11: 10:00:00 via INTRAVENOUS

## 2017-09-11 MED ORDER — FENTANYL CITRATE (PF) 100 MCG/2ML IJ SOLN
50.0000 ug | Freq: Once | INTRAMUSCULAR | Status: AC
  Administered 2017-09-11: 50 ug via INTRAVENOUS

## 2017-09-11 MED ORDER — SODIUM CHLORIDE 0.9 % IV SOLN
INTRAVENOUS | Status: DC | PRN
  Administered 2017-09-14: 10:00:00 via INTRA_ARTERIAL
  Administered 2017-09-15: 10 mL/h via INTRA_ARTERIAL

## 2017-09-11 MED ORDER — DILTIAZEM HCL 100 MG IV SOLR
5.0000 mg/h | INTRAVENOUS | Status: DC
  Administered 2017-09-11: 5 mg/h via INTRAVENOUS
  Administered 2017-09-12: 10 mg/h via INTRAVENOUS
  Administered 2017-09-12: 15 mg/h via INTRAVENOUS
  Filled 2017-09-11 (×2): qty 100

## 2017-09-11 MED ORDER — LACTATED RINGERS IV SOLN
INTRAVENOUS | Status: DC | PRN
  Administered 2017-09-11: 15:00:00 via INTRAVENOUS

## 2017-09-11 MED ORDER — CHLORHEXIDINE GLUCONATE 0.12% ORAL RINSE (MEDLINE KIT)
15.0000 mL | Freq: Two times a day (BID) | OROMUCOSAL | Status: DC
  Administered 2017-09-11 – 2017-09-19 (×17): 15 mL via OROMUCOSAL

## 2017-09-11 MED ORDER — CEFAZOLIN SODIUM-DEXTROSE 2-3 GM-%(50ML) IV SOLR
INTRAVENOUS | Status: DC | PRN
  Administered 2017-09-11: 2 g via INTRAVENOUS

## 2017-09-11 MED ORDER — DEXTROSE 50 % IV SOLN
INTRAVENOUS | Status: AC
  Administered 2017-09-12: 25 mL
  Filled 2017-09-11: qty 50

## 2017-09-11 MED ORDER — SODIUM CHLORIDE 0.9 % IV SOLN
Freq: Once | INTRAVENOUS | Status: AC
  Administered 2017-09-11: 14:00:00 via INTRAVENOUS

## 2017-09-11 MED ORDER — PIPERACILLIN-TAZOBACTAM 3.375 G IVPB
3.3750 g | Freq: Two times a day (BID) | INTRAVENOUS | Status: AC
  Administered 2017-09-11 – 2017-09-17 (×14): 3.375 g via INTRAVENOUS
  Filled 2017-09-11 (×15): qty 50

## 2017-09-11 MED ORDER — ALTEPLASE 2 MG IJ SOLR
2.0000 mg | Freq: Once | INTRAMUSCULAR | Status: DC | PRN
  Filled 2017-09-11: qty 2

## 2017-09-11 MED ORDER — FENTANYL CITRATE (PF) 100 MCG/2ML IJ SOLN
INTRAMUSCULAR | Status: DC | PRN
Start: 2017-09-11 — End: 2017-09-11
  Administered 2017-09-11: 50 ug via INTRAVENOUS

## 2017-09-11 MED ORDER — ORAL CARE MOUTH RINSE
15.0000 mL | Freq: Four times a day (QID) | OROMUCOSAL | Status: DC
  Administered 2017-09-11 – 2017-09-19 (×30): 15 mL via OROMUCOSAL

## 2017-09-11 MED ORDER — SODIUM CHLORIDE 0.9 % IV SOLN
0.0000 ug/min | INTRAVENOUS | Status: DC
  Administered 2017-09-11: 250 ug/min via INTRAVENOUS
  Administered 2017-09-11: 20 ug/min via INTRAVENOUS
  Administered 2017-09-12: 200 ug/min via INTRAVENOUS
  Filled 2017-09-11: qty 40
  Filled 2017-09-11 (×3): qty 4
  Filled 2017-09-11: qty 40

## 2017-09-11 MED ORDER — POTASSIUM PHOSPHATES 15 MMOLE/5ML IV SOLN
40.0000 meq | Freq: Once | INTRAVENOUS | Status: AC
  Administered 2017-09-11: 40 meq via INTRAVENOUS
  Filled 2017-09-11: qty 9.09

## 2017-09-11 MED ORDER — HEPARIN SODIUM (PORCINE) 1000 UNIT/ML DIALYSIS
1000.0000 [IU] | INTRAMUSCULAR | Status: DC | PRN
  Administered 2017-09-12: 1000 [IU] via INTRAVENOUS_CENTRAL

## 2017-09-11 MED ORDER — ROCURONIUM BROMIDE 50 MG/5ML IV SOLN
1.0000 mg/kg | Freq: Once | INTRAVENOUS | Status: AC
Start: 2017-09-11 — End: 2017-09-11
  Administered 2017-09-11: 53.4 mg via INTRAVENOUS
  Filled 2017-09-11: qty 5.34

## 2017-09-11 MED ORDER — PHENYLEPHRINE HCL 10 MG/ML IJ SOLN
0.0000 ug/min | INTRAMUSCULAR | Status: DC
  Filled 2017-09-11: qty 1

## 2017-09-11 MED ORDER — LACTATED RINGERS IV BOLUS (SEPSIS)
500.0000 mL | Freq: Once | INTRAVENOUS | Status: AC
  Administered 2017-09-11: 500 mL via INTRAVENOUS

## 2017-09-11 MED ORDER — ONDANSETRON HCL 4 MG/2ML IJ SOLN
INTRAMUSCULAR | Status: DC | PRN
  Administered 2017-09-11: 4 mg via INTRAVENOUS

## 2017-09-11 MED ORDER — LIDOCAINE HCL (PF) 1 % IJ SOLN: 5.0000 mL | INTRAMUSCULAR | Status: DC | PRN

## 2017-09-11 MED ORDER — PENTAFLUOROPROP-TETRAFLUOROETH EX AERO: 1.0000 "application " | INHALATION_SPRAY | CUTANEOUS | Status: DC | PRN

## 2017-09-11 MED ORDER — DILTIAZEM HCL 100 MG IV SOLR
INTRAVENOUS | Status: DC | PRN
  Administered 2017-09-11: 5 mg/h via INTRAVENOUS

## 2017-09-11 MED ORDER — ROCURONIUM BROMIDE 100 MG/10ML IV SOLN
INTRAVENOUS | Status: DC | PRN
  Administered 2017-09-11: 50 mg via INTRAVENOUS

## 2017-09-11 MED ORDER — MIDAZOLAM HCL 5 MG/5ML IJ SOLN
INTRAMUSCULAR | Status: DC | PRN
  Administered 2017-09-11: 2 mg via INTRAVENOUS

## 2017-09-11 MED ORDER — ALBUMIN HUMAN 5 % IV SOLN
INTRAVENOUS | Status: DC | PRN
  Administered 2017-09-11: 15:00:00 via INTRAVENOUS

## 2017-09-11 MED ORDER — LIDOCAINE-PRILOCAINE 2.5-2.5 % EX CREA
1.0000 "application " | TOPICAL_CREAM | CUTANEOUS | Status: DC | PRN
  Filled 2017-09-11: qty 5

## 2017-09-11 MED ORDER — VANCOMYCIN HCL 500 MG IV SOLR
500.0000 mg | INTRAVENOUS | Status: DC
  Administered 2017-09-15: 500 mg via INTRAVENOUS
  Filled 2017-09-11 (×3): qty 500

## 2017-09-11 MED ORDER — FENTANYL CITRATE (PF) 100 MCG/2ML IJ SOLN
50.0000 ug | INTRAMUSCULAR | Status: DC | PRN
  Administered 2017-09-11 – 2017-09-13 (×7): 50 ug via INTRAVENOUS
  Filled 2017-09-11 (×5): qty 2

## 2017-09-11 SURGICAL SUPPLY — 52 items
BANDAGE ESMARK 6X9 LF (GAUZE/BANDAGES/DRESSINGS) IMPLANT
BLADE SAW SGTL 83.5X18.5 (BLADE) ×3 IMPLANT
BLADE SURG 10 STRL SS (BLADE) ×3 IMPLANT
BNDG CMPR 9X6 STRL LF SNTH (GAUZE/BANDAGES/DRESSINGS) ×1
BNDG COHESIVE 4X5 TAN STRL (GAUZE/BANDAGES/DRESSINGS) ×4 IMPLANT
BNDG COHESIVE 6X5 TAN STRL LF (GAUZE/BANDAGES/DRESSINGS) ×3 IMPLANT
BNDG ESMARK 6X9 LF (GAUZE/BANDAGES/DRESSINGS) ×3
BNDG GAUZE ELAST 4 BULKY (GAUZE/BANDAGES/DRESSINGS) ×5 IMPLANT
COVER SURGICAL LIGHT HANDLE (MISCELLANEOUS) ×3 IMPLANT
CUFF TOURNIQUET SINGLE 34IN LL (TOURNIQUET CUFF) ×2 IMPLANT
DRAPE HALF SHEET 40X57 (DRAPES) ×9 IMPLANT
DRAPE ORTHO SPLIT 77X108 STRL (DRAPES) ×6
DRAPE SURG ORHT 6 SPLT 77X108 (DRAPES) ×2 IMPLANT
DRAPE U-SHAPE 47X51 STRL (DRAPES) ×3 IMPLANT
DRSG PAD ABDOMINAL 8X10 ST (GAUZE/BANDAGES/DRESSINGS) ×4 IMPLANT
DURAPREP 26ML APPLICATOR (WOUND CARE) ×3 IMPLANT
EVACUATOR 1/8 PVC DRAIN (DRAIN) IMPLANT
GAUZE SPONGE 4X4 12PLY STRL (GAUZE/BANDAGES/DRESSINGS) ×2 IMPLANT
GAUZE XEROFORM 5X9 LF (GAUZE/BANDAGES/DRESSINGS) ×3 IMPLANT
GLOVE BIO SURGEON STRL SZ8 (GLOVE) ×3 IMPLANT
GLOVE ORTHO TXT STRL SZ7.5 (GLOVE) ×5 IMPLANT
GLOVE SURG SS PI 6.5 STRL IVOR (GLOVE) ×2 IMPLANT
GOWN STRL REUS W/ TWL LRG LVL3 (GOWN DISPOSABLE) ×1 IMPLANT
GOWN STRL REUS W/ TWL XL LVL3 (GOWN DISPOSABLE) ×4 IMPLANT
GOWN STRL REUS W/TWL LRG LVL3 (GOWN DISPOSABLE) ×6
GOWN STRL REUS W/TWL XL LVL3 (GOWN DISPOSABLE) ×15
KIT ROOM TURNOVER OR (KITS) ×3 IMPLANT
MANIFOLD NEPTUNE II (INSTRUMENTS) ×3 IMPLANT
NS IRRIG 1000ML POUR BTL (IV SOLUTION) ×3 IMPLANT
PACK GENERAL/GYN (CUSTOM PROCEDURE TRAY) ×3 IMPLANT
PAD ARMBOARD 7.5X6 YLW CONV (MISCELLANEOUS) ×6 IMPLANT
PAD CAST 3X4 CTTN HI CHSV (CAST SUPPLIES) ×3 IMPLANT
PADDING CAST COTTON 3X4 STRL (CAST SUPPLIES) ×9
SPONGE LAP 18X18 X RAY DECT (DISPOSABLE) ×2 IMPLANT
STAPLER VISISTAT 35W (STAPLE) ×2 IMPLANT
STOCKINETTE IMPERVIOUS LG (DRAPES) ×2 IMPLANT
SUT ETHILON 2 0 PSLX (SUTURE) ×2 IMPLANT
SUT PDS AB 1 CTX 36 (SUTURE) ×6 IMPLANT
SUT SILK 2 0 (SUTURE)
SUT SILK 2 0 SH CR/8 (SUTURE) ×3 IMPLANT
SUT SILK 2-0 18XBRD TIE 12 (SUTURE) IMPLANT
SUT VIC AB 0 CT1 27 (SUTURE) ×6
SUT VIC AB 0 CT1 27XBRD ANBCTR (SUTURE) ×2 IMPLANT
SUT VIC AB 2-0 CT1 27 (SUTURE) ×6
SUT VIC AB 2-0 CT1 TAPERPNT 27 (SUTURE) ×2 IMPLANT
SWAB COLLECTION DEVICE MRSA (MISCELLANEOUS) IMPLANT
SWAB CULTURE ESWAB REG 1ML (MISCELLANEOUS) IMPLANT
TOWEL OR 17X24 6PK STRL BLUE (TOWEL DISPOSABLE) ×3 IMPLANT
TOWEL OR 17X26 10 PK STRL BLUE (TOWEL DISPOSABLE) ×3 IMPLANT
TUBE CONNECTING 12'X1/4 (SUCTIONS) ×1
TUBE CONNECTING 12X1/4 (SUCTIONS) ×2 IMPLANT
WATER STERILE IRR 1000ML POUR (IV SOLUTION) ×3 IMPLANT

## 2017-09-11 NOTE — Brief Op Note (Signed)
08/29/2017  3:46 PM  PATIENT:  Steven Forbes  83 y.o. male  PRE-OPERATIVE DIAGNOSIS:  Peripheral Vascular disease  POST-OPERATIVE DIAGNOSIS:  Peripheral Vascular disease  PROCEDURE:  Procedure(s): AMPUTATION ABOVE KNEE (Right)  SURGEON:  Surgeon(s) and Role:    Mcarthur Rossetti, MD - Primary  ANESTHESIA:   general  EBL:  50 mL   COUNTS:  YES  TOURNIQUET:   Total Tourniquet Time Documented: Thigh (Left) - 16 minutes Total: Thigh (Left) - 16 minutes   DICTATION: .Other Dictation: Dictation Number [id number]  PLAN OF CARE: Admit to inpatient   PATIENT DISPOSITION:  ICU - intubated and critically ill.   Delay start of Pharmacological VTE agent (>24hrs) due to surgical blood loss or risk of bleeding: no

## 2017-09-11 NOTE — Progress Notes (Signed)
NP/MD notified of pts HR sustaining in 140s/150s. Received orders for 1/2 L bolus LR. Orders carried out. Nursing will continue to monitor.

## 2017-09-11 NOTE — Anesthesia Postprocedure Evaluation (Signed)
Anesthesia Post Note  Patient: Steven Forbes  Procedure(s) Performed: AMPUTATION ABOVE KNEE (Right Leg Upper)     Patient location during evaluation: SICU Anesthesia Type: General Level of consciousness: sedated Pain management: pain level controlled Vital Signs Assessment: post-procedure vital signs reviewed and stable Respiratory status: patient remains intubated per anesthesia plan Cardiovascular status: stable Postop Assessment: no apparent nausea or vomiting Anesthetic complications: no    Last Vitals:  Vitals:   08/28/2017 1618 08/24/2017 1630  BP:    Pulse:    Resp: 18 16  Temp: (!) 36.3 C   SpO2:      Last Pain:  Vitals:   09/15/2017 1618  TempSrc: Axillary                 Sneha Willig DANIEL

## 2017-09-11 NOTE — Consult Note (Addendum)
[facility] Renal Consultation Note    Indication for Consultation:  Management of ESRD/hemodialysis; anemia, hypertension/volume and secondary hyperparathyroidism PCP:  HPI: Steven Forbes is a 83 y.o. male with ESRD on hemodialysis MWF at [facility]. PMH of HTN, DMT2, S/P amputation of toes R foot. He presented to ED 09/09/2017 after developing AMS, hypotension, hypoglycemia at dialysis unit. BS on arrival to ED was 45 Lactic acid 7.49 WBC 20.9 HGB 8.5 BP 90/50 HR 150s AFib RVR with BBB. He has been admitted with sepsis. He is a DNR however he is currently on pressors in the ICU. Vanc/Zosyn per primary. Orthopedics has been consulted for amputation of gangrenous R leg. Palliative care has been consulted. Critical care is managing. Last HD 09/04/2017-ran full treatment.   Currently he is sleeping, unresponsive to verbal, however later in exam, he opened eyes and smiled. He is non-verbal at present. AFIB with RVR on monitor. HR 120s. On Neo at 11 mcg/min. SBP 80s but unsure of accuracy of cuff. Awaiting placement of aline. Daughter is at bedside. She knows that he is supposed to have surgery today and this is a high risk procedure. Family to arrive later to make decision about course of action to take.   Of note, patient has had increasing issues with dementia, difficult to keep in chair at dialysis. He asks for his wife as soon as he is placed on dialysis, becomes agitated as treatment progresses.   Past Medical History:  Diagnosis Date  . Anemia   . Asthma   . Dementia   . Diabetes mellitus    type 2  . ESRD (end stage renal disease) ([place])    Dialysis on MONDAY, [place] and FRIDAYS  . Flu 08/2014  . GERD (gastroesophageal reflux disease)   . History of blood transfusion   . HOH (hard of hearing)   . Hx of echocardiogram    a.  Echocardiogram (09/10/2013): EF 50-55%, normal wall motion, grade 2 diastolic dysfunction, mild aortic stenosis, trivial AI, MAC,  moderate MR, mild LAE, moderate to severe TR, moderately increased PASP.  [name] Hx of non-ST elevation myocardial infarction (NSTEMI)    a. in setting of influenza, volume overload 08/2013 => Lexiscan Myoview (09/11/2013): Subtle ischemia in the anteroseptal wall, EF 44%. => med Rx recommended  . Hyperlipidemia   . Hypertension   . Shortness of breath    with exertion   Past Surgical History:  Procedure Laterality Date  . ANTERIOR APPROACH HEMI HIP ARTHROPLASTY Left 03/18/2017   Procedure: ANTERIOR APPROACH HEMI HIP ARTHROPLASTY LEFT;  Surgeon: Leandrew Koyanagi, MD;  Location: [facility];  Service: Orthopedics;  Laterality: Left;  . AV FISTULA PLACEMENT  2008   Left Upper Arm  . COLONOSCOPY    . EYE SURGERY Bilateral    cataract surgery  . LAPAROSCOPIC GASTROTOMY W/ REPAIR OF ULCER    . REVISON OF ARTERIOVENOUS FISTULA Left 11/21/2013   Procedure: REVISION OF ARTERIOVENOUS FISTULA;  Surgeon: Angelia Mould, MD;  Location: [facility];  Service: Vascular;  Laterality: Left;  . REVISON OF ARTERIOVENOUS FISTULA Left 02/18/2016   Procedure: excision of ulcerated skin over left brachio-cephalic AV fistula x 2;  Surgeon: [name] Misty, MD;  Location: [facility];  Service: Vascular;  Laterality: Left;  . TOE AMPUTATION Right    all toes   Family History  Problem Relation Age of Onset  . Cancer Mother   . Hypertension Mother   . Cancer Father   . Diabetes  Father   . Hypertension Father   . Diabetes Son   . Diabetes Daughter   . Diabetes Daughter   . Diabetes Cousin    Social History:  reports that  has never smoked. he has never used smokeless tobacco. He reports that he does not drink alcohol or use drugs. No Known Allergies Prior to Admission medications   Medication Sig Start Date End Date Taking? Authorizing Provider  acetaminophen (TYLENOL) 500 MG tablet Take 500 mg by mouth at bedtime.   Yes [provider]  albuterol (PROAIR HFA) 108 (90 Base) MCG/ACT inhaler Inhale 2 puffs into the  lungs 2 (two) times daily.   Yes [provider]  atorvastatin (LIPITOR) 80 MG tablet Take 80 mg by mouth at bedtime.  09/23/13  Yes [provider]  [facility] ASPIRIN LOW DOSE 81 MG EC tablet TAKE 1 TABLET (81 MG TOTAL) BY MOUTH DAILY. NEED OV. Patient taking differently: Take 81 mg by mouth daily.  11/28/16  Yes Lelon Perla, MD  doxycycline (VIBRA-TABS) 100 MG tablet TAKE 1 TABLET BY MOUTH TWICE A DAY Patient taking differently: TAKE 1 TABLET (100 MG) BY MOUTH TWICE A DAY (#30 FILLED 08/29/17) 08/29/17  Yes Newt Minion, MD  ENSURE (ENSURE) Take 237 mLs by mouth daily.   Yes [provider]  multivitamin (RENA-VIT) TABS tablet Take 1 tablet by mouth at bedtime.   Yes [provider]  omeprazole (PRILOSEC) 20 MG capsule Take 1 capsule (20 mg total) by mouth daily. Patient taking differently: Take 20 mg by mouth at bedtime.  01/19/17  Yes Reyne Dumas, MD  acetaminophen (TYLENOL) 325 MG tablet Take 2 tablets (650 mg total) by mouth every 6 (six) hours as needed for mild pain (or Fever >/= 101). Patient not taking: Reported on 08/30/2017 03/21/17   Bonnielee Haff, MD  darbepoetin [facility]) 100 MCG/0.5ML SOLN injection Inject 0.5 mLs (100 mcg total) into the vein every Monday with hemodialysis. 09/13/13   Charlynne Cousins, MD  doxercalciferol (HECTOROL) 4 MCG/2ML injection Inject 1 mL (2 mcg total) into the vein every Monday, Wednesday, and Friday with hemodialysis. 03/22/17   Bonnielee Haff, MD  feeding supplement (BOOST / RESOURCE BREEZE) LIQD Take 1 Container by mouth 3 (three) times daily between meals. Patient not taking: Reported on 09/03/2017 03/21/17   Bonnielee Haff, MD  HYDROcodone-acetaminophen [facility]) 7.5-325 MG tablet Take 1-2 tablets by mouth every 6 (six) hours as needed for moderate pain. Patient not taking: Reported on 09/16/2017 03/18/17   Leandrew Koyanagi, MD  methocarbamol (ROBAXIN) 500 MG tablet Take 1 tablet (500 mg total) by mouth every 6 (six)  hours as needed for muscle spasms. Patient not taking: Reported on 09/17/2017 03/21/17   Bonnielee Haff, MD  silver sulfADIAZINE (SILVADENE) 1 % cream Apply 1 application topically daily. Patient not taking: Reported on 09/09/2017 08/24/17   Suzan Slick, NP   Current Facility-Administered Medications  Medication Dose Route Frequency Provider Last Rate Last Dose  . 0.9 %  sodium chloride infusion  250 mL Intravenous PRN Omar Person, NP      . 0.9 %  sodium chloride infusion   Intra-arterial PRN Mannam, Praveen, MD      . chlorhexidine (PERIDEX) 0.12 % solution 15 mL  15 mL Mouth Rinse BID Mannam, Praveen, MD      . fentaNYL (SUBLIMAZE) injection 12.5 mcg  12.5 mcg Intravenous Q2H PRN Omar Person, NP   12.5 mcg at 08/21/2017 0553  . heparin  injection 5,000 Units  5,000 Units Subcutaneous Q8H Omar Person, NP   5,000 Units at 09/06/2017 8768  . insulin aspart (novoLOG) injection 2-6 Units  2-6 Units Subcutaneous Q4H Hayden Pedro M, NP      . LORazepam (ATIVAN) injection 0.5 mg  0.5 mg Intravenous Q6H PRN Omar Person, NP   0.5 mg at 09/02/2017 0404  . MEDLINE mouth rinse  15 mL Mouth Rinse BID Mannam, Praveen, MD      . MEDLINE mouth rinse  15 mL Mouth Rinse q12n4p Mannam, Praveen, MD      . norepinephrine (LEVOPHED) 4 mg in dextrose 5 % 250 mL (0.016 mg/mL) infusion  0-20 mcg/min Intravenous Continuous Omar Person, NP 41.3 mL/hr at 08/21/2017 0832 11 mcg/min at 09/06/2017 0832  . pantoprazole (PROTONIX) injection 40 mg  40 mg Intravenous Q24H Hayden Pedro M, NP      . piperacillin-tazobactam (ZOSYN) IVPB 3.375 g  3.375 g Intravenous Q12H Erenest Blank, RPH      . potassium PHOSPHATE 40 mEq in dextrose 5 % 500 mL infusion  40 mEq Intravenous Once Omar Person, NP 85 mL/hr at 09/16/2017 0804 40 mEq at 09/16/2017 0804  . [START ON 09/13/2017] vancomycin (VANCOCIN) 500 mg in sodium chloride 0.9 % 100 mL IVPB  500 mg Intravenous Q M,W,F-HD Erenest Blank  [facility]       Labs: Basic Metabolic Panel: Recent Labs  Lab 09/09/2017 1802 09/06/2017 0213  NA 137 137  K 4.2 3.4*  CL 102 102  CO2 23 23  GLUCOSE 199* 149*  BUN 6 6  CREATININE 1.58* 1.53*  CALCIUM 7.0* 7.1*  PHOS  --  1.6*   Liver Function Tests: Recent Labs  Lab 09/07/2017 1802  AST 47*  ALT 40  ALKPHOS 151*  BILITOT 0.9  PROT 4.5*  ALBUMIN 1.2*   No results for input(s): LIPASE, AMYLASE in the last 168 hours. No results for input(s): AMMONIA in the last 168 hours. CBC: Recent Labs  Lab 08/29/2017 1802 09/08/2017 0213  WBC 20.9* 30.8*  NEUTROABS 18.2*  --   HGB 8.5* 9.2*  HCT 26.5* 28.5*  MCV 94.0 93.1  PLT 174 178   Cardiac Enzymes: Recent Labs  Lab 08/29/2017 2215 08/27/2017 0213  TROPONINI 0.38* 0.36*   CBG: Recent Labs  Lab 09/04/2017 1949 09/06/2017 2221 08/20/2017 0014 08/21/2017 0352 09/02/2017 0748  GLUCAP 118* 121* 123* 106* 76   Iron Studies: No results for input(s): IRON, TIBC, TRANSFERRIN, FERRITIN in the last 72 hours. Studies/Results: Dg Chest Port 1 View  Result Date: 08/31/2017 CLINICAL DATA:  83 year old male with altered mental status. EXAM: PORTABLE CHEST 1 VIEW COMPARISON:  Chest radiograph dated 03/21/2017 FINDINGS: Right IJ dialysis catheter with tip over the right atrium in similar position. There are minimal bibasilar atelectatic changes. No focal consolidation, pleural effusion, or pneumothorax. Stable cardiomegaly. Prominence of the central pulmonary arteries suggestive of underlying pulmonary hypertension. There is atherosclerotic calcification of the aortic arch. No acute osseous pathology. IMPRESSION: 1. Minimal bibasilar atelectasis.  No focal consolidation 2. Findings likely suggest a degree of pulmonary hypertension. Clinical correlation is recommended. 3. Stable cardiomegaly.  Dialysis catheter in similar position. Electronically Signed   By: Anner Crete M.D.   On: 09/06/2017 18:14    ROS: As per HPI otherwise negative.   Physical  Exam: Vitals:   09/17/2017 0648 08/30/2017 0700 08/23/2017 0749 09/08/2017 0800  BP: 95/66 (!) 87/50  (!) 83/56  Pulse: (!) 124 (!) 128  [name])  126  Resp: 11 19  (!) 23  Temp:   98.7 F (37.1 C)   TempSrc:   Axillary   SpO2: 100% 100%  100%  Weight:      Height:         General: Chronically ill appearing elderly male in no acute distress. Head: Normocephalic, atraumatic, sclera non-icteric, mucus membranes are moist Neck: Supple. JVD not elevated. Lungs: Clear bilaterally to auscultation without wheezes, rales, or rhonchi. Breathing is unlabored. Heart: Tachy, irregular with S1 S2. No murmurs, rubs, or gallops appreciated. Abdomen: Soft, non-tender, non-distended with normoactive bowel sounds. No rebound/guarding. No obvious abdominal masses. M-S:  Strength and tone appear normal for age. Lower extremities: RLE in ace wrap 1+ edema RLE. No edema LLE.  Neuro: appears obtunded at intervals, no responding consistently to verbal stimuli, not following commands.  Psych:  Not responding to questions.  Dialysis Access: RIJ TDC drsg intact  CXR 12/24 - no edema  Dialysis Orders: [facility] MWF  4h   51.5kg  3K/2.25 bath  Hep 2500   R IJ cath 180NRe 400/auto 1.5   UFP 4 -Mircera 50 mcg IV q 2 weeks (last dose 08/31/17 Last HGB 9.0 09/06/17)   Assessment/Plan: 1.  Sepsis: Per primary. Gangrenous R leg. Vanc/Zosyn.  2.  AFib RVR-had received IVF, 150 mg amiodarone bolus IV.  3.  ESRD -  MWF. Hold off orders for HD until palliative care meeting.  4.  Hypotension/volume  - on pressors. Has gotten IV bolus. Appears on dry side.  5.  Anemia  - HGB 8.5. ESA if he has HD tomorrow.   6. Metabolic bone disease -  No binders/VDRA. Ca 7 C Ca 8.9 7.  Nutrition - Severe PCM Albumin 1.6 probably R/T sepsis. NPO at present 8. DM: per primary 9. GOC: Palliative care meeting scheduled today. Unsure if family fully comprehends risk of surgery.   Rita H. Owens Shark, NP-C 09/08/2017, 8:59 AM  [employer] [phone]  Pt seen, examined and agree w A/P as above. ESRD pt with dementia, afib presented with gangrenous leg , going for leg amputation now.  Is in shock on pressors.  Not a CRRT candidate given comorbidities.  Will plan HD tonight at bedside w/ pressor support as tolerated. Prognosis poor.    Kelly Splinter MD [employer] pager [phone]   08/30/2017, 3:53 PM

## 2017-09-11 NOTE — Anesthesia Preprocedure Evaluation (Addendum)
Anesthesia Evaluation  Patient identified by MRN, date of birth, ID band Patient unresponsive    Reviewed: Allergy & Precautions, NPO status , Patient's Chart, lab work & pertinent test results  Airway Mallampati: Intubated       Dental no notable dental hx.    Pulmonary shortness of breath, asthma ,  Intubated   breath sounds clear to auscultation   + intubated    Cardiovascular hypertension, Pt. on medications + CAD, + Past MI, + Peripheral Vascular Disease and +CHF  + dysrhythmias Atrial Fibrillation + Valvular Problems/Murmurs AS and AI  Rhythm:Irregular Rate:Tachycardia - Systolic murmurs Type II NSTEMI   Neuro/Psych PSYCHIATRIC DISORDERS dementia    GI/Hepatic Neg liver ROS, GERD  Medicated and Controlled,  Endo/Other  diabetes, Type 2  Renal/GU ESRF and DialysisRenal diseaseDialysis yesterday     Musculoskeletal   Abdominal   Peds  Hematology  (+) anemia ,   Anesthesia Other Findings Mild AS, mean 62mmHg, mod AI, EF 55%  Reproductive/Obstetrics                           Anesthesia Physical  Anesthesia Plan  ASA: IV and emergent  Anesthesia Plan: General   Post-op Pain Management:    Induction: Intravenous  PONV Risk Score and Plan: 2 and Ondansetron and Treatment may vary due to age or medical condition  Airway Management Planned: Oral ETT  Additional Equipment: None  Intra-op Plan:   Post-operative Plan: Post-operative intubation/ventilation  Informed Consent: I have reviewed the patients History and Physical, chart, labs and discussed the procedure including the risks, benefits and alternatives for the proposed anesthesia with the patient or authorized representative who has indicated his/her understanding and acceptance.     Plan Discussed with: CRNA, Anesthesiologist and Surgeon  Anesthesia Plan Comments:       Anesthesia Quick Evaluation

## 2017-09-11 NOTE — Progress Notes (Signed)
Patient was laying in bed feeling restless. Family had gone for palliative care meeting and they came in soon there after agreeing to the suggested medical intervention about intubation and amputation. Chaplain Provided emotional support and compassionate presence and escorted family on their way out as requested.  Milford Cilento a Medical sales representative, [employer]

## 2017-09-11 NOTE — Progress Notes (Signed)
Patient ID: Steven Forbes, male   DOB: 12/08/1925, 83 y.o.   MRN: [id number] The patient is intubated in the ICU and is septic appearing.  In spite of talking in length with the family last evening and today as well as having Palliative Care speak with the family, they are still convinced that a right above-the-knee amputation is needed given his gangrenous right leg/foot.  According to his wife, this could help "get the poison out of his body."  I have told them that there is a high likelihood that he will not come off of the ventilator and that even surgery can speed up his demise.  They still wish for surgery to take place today.  Informed consent is obtained from his wife and witnessed by ICU nursing staff.

## 2017-09-11 NOTE — Procedures (Signed)
Central Venous Catheter Insertion Procedure Note Rakwon Letourneau [id number] 04/12/1926  Procedure: Insertion of Central Venous Catheter Indications: Assessment of intravascular volume, Drug and/or fluid administration and Frequent blood sampling  Procedure Details Consent: Risks of procedure as well as the alternatives and risks of each were explained to the (patient/caregiver).  Consent for procedure obtained. Time Out: Verified patient identification, verified procedure, site/side was marked, verified correct patient position, special equipment/implants available, medications/allergies/relevent history reviewed, required imaging and test results available.  Performed  Maximum sterile technique was used including antiseptics, cap, gloves, gown, hand hygiene, mask and sheet. Skin prep: Chlorhexidine; local anesthetic administered A antimicrobial bonded/coated triple lumen catheter was placed in the left internal jugular vein using the Seldinger technique.  Evaluation Blood flow good Complications: No apparent complications Patient did tolerate procedure well. Chest X-ray ordered to verify placement.  CXR: pending.  Clementeen Graham 09/04/2017, 1:04 PM  Erick Colace ACNP-BC [place] Pager # [phone] OR # [phone] if no answer

## 2017-09-11 NOTE — Progress Notes (Signed)
   09/14/2017 1400  Clinical Encounter Type  Visited With Patient and family together  Spiritual Encounters  Spiritual Needs Emotional  Stress Factors  Patient Stress Factors Exhausted  Family Stress Factors Exhausted  Advance Directives (For Healthcare)  Does Patient Have a Medical Advance Directive? Yes

## 2017-09-11 NOTE — Transfer of Care (Signed)
Immediate Anesthesia Transfer of Care Note  Patient: Steven Forbes  Procedure(s) Performed: AMPUTATION ABOVE KNEE (Right Leg Upper)  Patient Location: ICU  Anesthesia Type:General  Level of Consciousness: sedated and Patient remains intubated per anesthesia plan  Airway & Oxygen Therapy: Patient remains intubated per anesthesia plan and Patient placed on Ventilator (see vital sign flow sheet for setting)  Post-op Assessment: Report given to RN and Post -op Vital signs reviewed and stable  Post vital signs: Reviewed and stable  Last Vitals:  Vitals:   08/21/2017 1546 09/05/2017 1618  BP:    Pulse:    Resp:    Temp:  (!) 36.3 C  SpO2: 94%     Last Pain:  Vitals:   08/26/2017 1618  TempSrc: Axillary         Complications: No apparent anesthesia complications   Transported to 50M with full monitors.  Ventilation delivered with ambu 100% 12bpm.  Tolerated transport well.   Report to RN at bedside.  Phenylephrine increased to 250 mcg/min for BP 84/62 upon arival.  Atrial fib hr 110-120.

## 2017-09-11 NOTE — Procedures (Signed)
Arterial Catheter Insertion Procedure Note Steven Forbes [id number] 18-Oct-1925  Procedure: Insertion of Arterial Catheter  Indications: Blood pressure monitoring and Frequent blood sampling  Procedure Details Consent: Risks of procedure as well as the alternatives and risks of each were explained to the (patient/caregiver).  Consent for procedure obtained. Time Out: Verified patient identification, verified procedure, site/side was marked, verified correct patient position, special equipment/implants available, medications/allergies/relevent history reviewed, required imaging and test results available.  Performed  Maximum sterile technique was used including antiseptics, cap, gloves, gown, hand hygiene, mask and sheet. Skin prep: Chlorhexidine; local anesthetic administered 20 gauge catheter was inserted into left femoral artery using the Seldinger technique.  Evaluation Blood flow good; BP tracing good. Complications: No apparent complications.  Procedure performed under direct ultrasound guidance for real time vessel cannulation.      Steven Forbes, [facility] Pager: [phone]  or [phone] 08/27/2017, 1:01 PM

## 2017-09-11 NOTE — Progress Notes (Addendum)
Patient was laying in his bed . Patient seemed restless. Family members had gone for palliative care meeting in the consult room and were coming back. Soon after the family members came back after agreeing to the doctors briefing about the needed procedure to follow of intubation and amputation. Family then decided to go home and come back later. I escorted them on their way back.  [place]-Chaplain   09/07/2017 1400  Clinical Encounter Type  Visited With Patient and family together  Visit Type Initial  Referral From Chaplain  Spiritual Encounters  Spiritual Needs Emotional  Stress Factors  Patient Stress Factors Exhausted  Family Stress Factors Exhausted  Advance Directives (For Healthcare)  Does Patient Have a Medical Advance Directive? Yes

## 2017-09-11 NOTE — Progress Notes (Signed)
Pts daughter Ronny Bacon called to update RN that pts family would like to go ahead and proceed with R AKA on 12/24. NP made aware. RN advised for family to return in the morning to perform all pre-op consent forms. Nursing will continue to monitor.

## 2017-09-11 NOTE — Progress Notes (Signed)
PULMONARY / CRITICAL CARE MEDICINE   Name: Steven Forbes MRN: [id number] DOB: 02/21/1926    ADMISSION DATE:  09/02/2017 CONSULTATION DATE:  08/31/2017  REFERRING MD:  Dr. Ericka Pontiff   CHIEF COMPLAINT:  AMS   HISTORY OF PRESENT ILLNESS:   83 year old male with extensive PMH including dementia, DM, ESRD on HD M/W/F, GERD, Anemia, HLD, HTN, Aortic Stenosis, right transmetatarsal amputation, and known gangrenous right leg  Presents to ED from home with progressive lethargy. Per family patient was scheduled January 2 for AKA of right leg. Presented hypotensive, LA 7.49, given 2 L Bolus, Vancomycin/Zosyn and started on Levophed Gtt. PCCM called for admission.    Orthopedics was consulted. Agree with surgery 12/24. However, have discussed with family high risk due to multiple co-morbidites and current state of health.  PAST MEDICAL HISTORY :  He  has a past medical history of Anemia, Asthma, Dementia, Diabetes mellitus, ESRD (end stage renal disease) ([address]), Flu (08/2014), GERD (gastroesophageal reflux disease), History of blood transfusion, HOH (hard of hearing), echocardiogram, non-ST elevation myocardial infarction (NSTEMI), Hyperlipidemia, Hypertension, and Shortness of breath.  PAST SURGICAL HISTORY: He  has a past surgical history that includes Laparoscopic gastrotomy w/ repair of ulcer; AV fistula placement (2008); Toe amputation (Right); Revison of arteriovenous fistula (Left, 11/21/2013); Eye surgery (Bilateral); Colonoscopy; Revison of arteriovenous fistula (Left, 02/18/2016); and Anterior approach hemi hip arthroplasty (Left, 03/18/2017).  No Known Allergies  No current facility-administered medications on file prior to encounter.    Current Outpatient Medications on File Prior to Encounter  Medication Sig  . acetaminophen (TYLENOL) 500 MG tablet Take 500 mg by mouth at bedtime.  [name] albuterol (PROAIR HFA) 108 (90 Base) MCG/ACT inhaler Inhale 2 puffs into the lungs 2 (two) times daily.  [name]  atorvastatin (LIPITOR) 80 MG tablet Take 80 mg by mouth at bedtime.   . CVS ASPIRIN LOW DOSE 81 MG EC tablet TAKE 1 TABLET (81 MG TOTAL) BY MOUTH DAILY. NEED OV. (Patient taking differently: Take 81 mg by mouth daily. )  . doxycycline (VIBRA-TABS) 100 MG tablet TAKE 1 TABLET BY MOUTH TWICE A DAY (Patient taking differently: TAKE 1 TABLET (100 MG) BY MOUTH TWICE A DAY (#30 FILLED 08/29/17))  . ENSURE (ENSURE) Take 237 mLs by mouth daily.  . multivitamin (RENA-VIT) TABS tablet Take 1 tablet by mouth at bedtime.  [name] omeprazole (PRILOSEC) 20 MG capsule Take 1 capsule (20 mg total) by mouth daily. (Patient taking differently: Take 20 mg by mouth at bedtime. )  . acetaminophen (TYLENOL) 325 MG tablet Take 2 tablets (650 mg total) by mouth every 6 (six) hours as needed for mild pain (or Fever >/= 101). (Patient not taking: Reported on 09/02/2017)  . darbepoetin (ARANESP) 100 MCG/0.5ML SOLN injection Inject 0.5 mLs (100 mcg total) into the vein every Monday with hemodialysis.  [name] doxercalciferol (HECTOROL) 4 MCG/2ML injection Inject 1 mL (2 mcg total) into the vein every Monday, Wednesday, and Friday with hemodialysis.  . feeding supplement (BOOST / RESOURCE BREEZE) LIQD Take 1 Container by mouth 3 (three) times daily between meals. (Patient not taking: Reported on 08/30/2017)  . HYDROcodone-acetaminophen (NORCO) 7.5-325 MG tablet Take 1-2 tablets by mouth every 6 (six) hours as needed for moderate pain. (Patient not taking: Reported on 09/07/2017)  . methocarbamol (ROBAXIN) 500 MG tablet Take 1 tablet (500 mg total) by mouth every 6 (six) hours as needed for muscle spasms. (Patient not taking: Reported on 09/14/2017)  . silver sulfADIAZINE (SILVADENE) 1 % cream Apply 1  application topically daily. (Patient not taking: Reported on 09/13/2017)    FAMILY HISTORY:  His indicated that his mother is deceased. He indicated that his father is deceased. He indicated that the status of his son is unknown. He indicated  that the status of his cousin is unknown.   SOCIAL HISTORY: He  reports that  has never smoked. he has never used smokeless tobacco. He reports that he does not drink alcohol or use drugs.  REVIEW OF SYSTEMS:   Unable to review as patient is encephalopathic   SUBJECTIVE:  Remains on levophed, confused  VITAL SIGNS: BP 96/60   Pulse (!) 143   Temp 98.1 F (36.7 C) (Oral)   Resp 14   Ht 5\' 5"  (1.651 m)   Wt 117 lb 11.6 oz (53.4 kg)   SpO2 100%   BMI 19.59 kg/m   HEMODYNAMICS:    VENTILATOR SETTINGS:    INTAKE / OUTPUT: I/O last 3 completed shifts: In: 742 [I.V.:525.3; IV Piggyback:216.7] Out: -   PHYSICAL EXAMINATION: Gen:      No acute distress, frail, elderly HEENT:  EOMI, sclera anicteric Neck:     No masses; no thyromegaly Lungs:    Clear to auscultation bilaterally; normal respiratory effort CV:         Regular rate and rhythm; no murmurs Abd:      + bowel sounds; soft, non-tender; no palpable masses, no distension Ext:   Rt leg in bandages, toe amputation.  Skin:      Warm and dry; no rash Neuro: Somnolent, confused  LABS:  BMET Recent Labs  Lab 08/28/2017 1802 09/04/2017 0213  NA 137 137  K 4.2 3.4*  CL 102 102  CO2 23 23  BUN 6 6  CREATININE 1.58* 1.53*  GLUCOSE 199* 149*    Electrolytes Recent Labs  Lab 09/06/2017 1802 09/06/2017 0213  CALCIUM 7.0* 7.1*  MG  --  1.6*  PHOS  --  1.6*    CBC Recent Labs  Lab 09/06/2017 1802 09/12/2017 0213  WBC 20.9* 30.8*  HGB 8.5* 9.2*  HCT 26.5* 28.5*  PLT 174 178    Coag's No results for input(s): APTT, INR in the last 168 hours.  Sepsis Markers Recent Labs  Lab 09/07/2017 1936 09/04/2017 2215 09/03/2017 0213  LATICACIDVEN 7.33* 7.4* 5.0*    ABG Recent Labs  Lab 08/21/2017 1846  PHART 7.566*  PCO2ART 25.9*  PO2ART 133.0*    Liver Enzymes Recent Labs  Lab 09/18/2017 1802  AST 47*  ALT 40  ALKPHOS 151*  BILITOT 0.9  ALBUMIN 1.2*    Cardiac Enzymes Recent Labs  Lab 08/29/2017 2215  09/12/2017 0213  TROPONINI 0.38* 0.36*    Glucose Recent Labs  Lab 09/02/2017 1949 09/04/2017 2221 09/13/2017 0014 08/23/2017 0352 09/14/2017 0748 09/18/2017 1139  GLUCAP 118* 121* 123* 106* 76 131*    Imaging Dg Chest Port 1 View  Result Date: 09/17/2017 CLINICAL DATA:  83 year old male with altered mental status. EXAM: PORTABLE CHEST 1 VIEW COMPARISON:  Chest radiograph dated 03/21/2017 FINDINGS: Right IJ dialysis catheter with tip over the right atrium in similar position. There are minimal bibasilar atelectatic changes. No focal consolidation, pleural effusion, or pneumothorax. Stable cardiomegaly. Prominence of the central pulmonary arteries suggestive of underlying pulmonary hypertension. There is atherosclerotic calcification of the aortic arch. No acute osseous pathology. IMPRESSION: 1. Minimal bibasilar atelectasis.  No focal consolidation 2. Findings likely suggest a degree of pulmonary hypertension. Clinical correlation is recommended. 3. Stable cardiomegaly.  Dialysis  catheter in similar position. Electronically Signed   By: Anner Crete M.D.   On: 09/04/2017 18:14    STUDIES:  CXR 12/23 > Minimal bibasilar atelectasis.  No focal consolidation 2. Findings likely suggest a degree of pulmonary hypertension. Clinical correlation is recommended. 3. Stable cardiomegaly.  Dialysis catheter in similar position.  CULTURES: Blood 12/23 >>  ANTIBIOTICS: Vancomycin 12/23 >> Zosyn 12/23 >>   SIGNIFICANT EVENTS: 12/23 > Presents to ED   LINES/TUBES: PIV   DISCUSSION: 83 year old male with known gangrenous right leg, seen by Dr. Sharol Given in office, planned above knee amputation January 2. Presents to ED septic and lethargic. Hypotensive, LA 7.49, Troponin 0.38, WBC 20.9. Full DNR. Started on Levophed and STAT ortho consult.       ASSESSMENT / PLAN:  PULMONARY A: Tachypnea in setting of septic shock  P:   Supplemental O2 as tolerated Will likely need intubation. Full code per  family  CARDIOVASCULAR A:  Septic Shock in setting of gangrenous right leg Demand Ischemia  Troponin 0.29 >  Aortic Stenosis (EF on 03/17/17 50-55)  H/O HLD, HTN,  PVD S/P transmetatarsal amputation of right foot   P:  CVL, A line Continue pressors Follow CVP  RENAL A:   Lactic Acidosis in setting of septic shock  ESRD, HD M/W/F  LA improved some  P:   Trend serial chemistries  Nephrology following  Replace lytes as needed  GASTROINTESTINAL A:   GERD Protein Calorie Malnutrition  P:   NPO for surgery  PPI   HEMATOLOGIC A:   Anemia of Chronic disease   P:  Follow CBC. Transfuse for Hb < 7  INFECTIOUS A:   Septic Shock in setting of gangrenous right leg  -Followed by Dr. Sharol Given in office  P:   On schedule for amputation today F/u cultures On Vanc and zosyn. Day 2  ENDOCRINE A:   DM    P:   SSI protocol  NEUROLOGIC A:   Metabolic Encephalopathy  H/O Dementia  P:   Supportive care   FAMILY: Family updated by CCM team and palliative care. They want every thing done. Full code.  The patient is critically ill with multiple organ system failure and requires high complexity decision making for assessment and support, frequent evaluation and titration of therapies, advanced monitoring, review of radiographic studies and interpretation of complex data.   Critical Care Time devoted to patient care services, exclusive of separately billable procedures, described in this note is 35 minutes.   Marshell Garfinkel MD [facility] Pager [phone] If no answer or after 3pm call: [phone] 08/21/2017, 12:16 PM

## 2017-09-11 NOTE — Progress Notes (Signed)
Wasted 50 of fentanyl and 1 of versed with fidele in sharps in medication room. Modena Morrow E, RN 09/06/2017 1:55 PM

## 2017-09-11 NOTE — Consult Note (Signed)
Consultation Note Date: 08/20/2017   Patient Name: Steven Forbes  DOB: 07-27-1926  MRN: [id number]  Age / Sex: 83 y.o., male  PCP: Shon Baton, MD Referring Physician: Marshell Garfinkel, MD  Reason for Consultation: Establishing goals of care and Psychosocial/spiritual support  HPI/Patient Profile: 83 y.o. male   admitted on 09/04/2017 with a PMH including dementia, DM, ESRD on HD M/W/F, GERD, Anemia, HLD, HTN, Aortic Stenosis, right transmetatarsal amputation, and known gangrenous right leg  Presents to ED from home with progressive lethargy. Per family patient was scheduled January 2 for AKA of right leg. Presented hypotensive, LA 7.49, given 2 L Bolus, Vancomycin/Zosyn and started on Levophed Gtt. PCCM called for admission.    Orthopedics was consulted. Agree with surgery 12/24. However, have discussed with family high risk due to multiple co-morbidites and current state of health.  Family report continued physical, functional, and cognitive decline over the past 1 year.   Patient currently requiring pressors to stabilize blood pressure.  Family face advance directive decisions and  treatment option decisions.    Clinical Assessment and Goals of Care:  This NP Wadie Lessen reviewed medical records, received report from team, assessed the patient and then meet at the patient's bedside along with his wife and five daughters  to discuss diagnosis, prognosis, GOC, EOL wishes disposition and options.  Interactive discussion was had today specific to patient's multiple comorbidities and overall poor prognosis.  We discussed the limitations of medical interventions specific to dialysis when the patient can no longer tolerate the procedure. Discussed dementia as a terminal disease  We discussed the high risk of surgery 2/2 to medical comorbidities and complexities  Family report continued physical,  functional, and cognitive decline over the past year.  I detailed the concept of failure to thrive, and mortality as  a human condition.  A detailed discussion was had today regarding advanced directives.  Concepts specific to code status, artifical feeding and hydration, continued IV antibiotics and rehospitalization was had.  The difference between a aggressive medical intervention path  and a palliative comfort care path for this patient at this time was had.  Values and goals of care important to patient and family were attempted to be elicited.  Concept of  Palliative Care was discussed  Natural trajectory and expectations at EOL were discussed.  Questions and concerns addressed.   Family encouraged to call with questions or concerns.  PMT will continue to support holistically.    NEXT OF KIN/wife--however her family worked together as a whole unit hoping for the best interest of the patient.    SUMMARY OF RECOMMENDATIONS   All family members verbalized an understanding of the seriousness of the patient's medical condition.  All family members verbalize an understanding of the poor prognosis, however at this time the decision is made to move forward with surgery.  They understand that he may not survive the surgery but feel that " if he dies on the table that is God's will"  Code Status/Advance Care Planning:  DNR   Palliative Prophylaxis:   Aspiration, Bowel Regimen, Delirium Protocol, Frequent Pain Assessment and Oral Care  Additional Recommendations (Limitations, Scope, Preferences):  Full Scope Treatment  Psycho-social/Spiritual:   Desire for further Chaplaincy support:no- strong community church support  Additional Recommendations: Education on Hospice  Prognosis:   Limited prognosis, dependent on desire for life prolonging measures.  Discharge Planning: To Be Determined      Primary  Diagnoses: Present on Admission: . Sepsis ([place])   I have reviewed the medical record, interviewed the patient and family, and examined the patient. The following aspects are pertinent.  Past Medical History:  Diagnosis Date  . Anemia   . Asthma   . Dementia   . Diabetes mellitus    type 2  . ESRD (end stage renal disease) ([place])    Dialysis on MONDAY, [place] and FRIDAYS  . Flu 08/2014  . GERD (gastroesophageal reflux disease)   . History of blood transfusion   . HOH (hard of hearing)   . Hx of echocardiogram    a.  Echocardiogram (09/10/2013): EF 50-55%, normal wall motion, grade 2 diastolic dysfunction, mild aortic stenosis, trivial AI, MAC, moderate MR, mild LAE, moderate to severe TR, moderately increased PASP.  [name] Hx of non-ST elevation myocardial infarction (NSTEMI)    a. in setting of influenza, volume overload 08/2013 => Lexiscan Myoview (09/11/2013): Subtle ischemia in the anteroseptal wall, EF 44%. => med Rx recommended  . Hyperlipidemia   . Hypertension   . Shortness of breath    with exertion   Social History   Socioeconomic History  . Marital status: Married    Spouse name: None  . Number of children: 7  . Years of education: None  . Highest education level: None  Social Needs  . Financial resource strain: None  . Food insecurity - worry: None  . Food insecurity - inability: None  . Transportation needs - medical: None  . Transportation needs - non-medical: None  Occupational History  . Occupation: Retired  Tobacco Use  . Smoking status: Never Smoker  . Smokeless tobacco: Never Used  Substance and Sexual Activity  . Alcohol use: No    Alcohol/week: 0.0 oz  . Drug use: No  . Sexual activity: None  Other Topics Concern  . None  Social History Narrative  . None   Family History  Problem Relation Age of Onset  . Cancer Mother   . Hypertension Mother   . Cancer Father   . Diabetes Father   . Hypertension Father   . Diabetes Son   . Diabetes  Daughter   . Diabetes Daughter   . Diabetes Cousin    Scheduled Meds: . chlorhexidine  15 mL Mouth Rinse BID  . heparin  5,000 Units Subcutaneous Q8H  . insulin aspart  2-6 Units Subcutaneous Q4H  . mouth rinse  15 mL Mouth Rinse BID  . mouth rinse  15 mL Mouth Rinse q12n4p  . pantoprazole (PROTONIX) IV  40 mg Intravenous Q24H   Continuous Infusions: . sodium chloride    . sodium chloride    . norepinephrine (LEVOPHED) Adult infusion 11 mcg/min (08/29/2017 0908)  . piperacillin-tazobactam (ZOSYN)  IV 3.375 g (08/27/2017 0926)  . potassium PHOSPHATE IVPB (mEq) 40 mEq (08/29/2017 0804)  . [START ON 09/13/2017] vancomycin     PRN Meds:.sodium chloride, Place/Maintain arterial line **AND** sodium chloride, fentaNYL (SUBLIMAZE) injection, LORazepam Medications Prior to Admission:  Prior to Admission medications   Medication  Sig Start Date End Date Taking? Authorizing Provider  acetaminophen (TYLENOL) 500 MG tablet Take 500 mg by mouth at bedtime.   Yes [provider]  albuterol (PROAIR HFA) 108 (90 Base) MCG/ACT inhaler Inhale 2 puffs into the lungs 2 (two) times daily.   Yes [provider]  atorvastatin (LIPITOR) 80 MG tablet Take 80 mg by mouth at bedtime.  09/23/13  Yes [provider]  [facility] ASPIRIN LOW DOSE 81 MG EC tablet TAKE 1 TABLET (81 MG TOTAL) BY MOUTH DAILY. NEED OV. Patient taking differently: Take 81 mg by mouth daily.  11/28/16  Yes Lelon Perla, MD  doxycycline (VIBRA-TABS) 100 MG tablet TAKE 1 TABLET BY MOUTH TWICE A DAY Patient taking differently: TAKE 1 TABLET (100 MG) BY MOUTH TWICE A DAY (#30 FILLED 08/29/17) 08/29/17  Yes Newt Minion, MD  ENSURE (ENSURE) Take 237 mLs by mouth daily.   Yes [provider]  multivitamin (RENA-VIT) TABS tablet Take 1 tablet by mouth at bedtime.   Yes [provider]  omeprazole (PRILOSEC) 20 MG capsule Take 1 capsule (20 mg total) by mouth daily. Patient taking differently: Take 20 mg by  mouth at bedtime.  01/19/17  Yes Reyne Dumas, MD  acetaminophen (TYLENOL) 325 MG tablet Take 2 tablets (650 mg total) by mouth every 6 (six) hours as needed for mild pain (or Fever >/= 101). Patient not taking: Reported on 08/22/2017 03/21/17   Bonnielee Haff, MD  darbepoetin [facility]) 100 MCG/0.5ML SOLN injection Inject 0.5 mLs (100 mcg total) into the vein every Monday with hemodialysis. 09/13/13   Charlynne Cousins, MD  doxercalciferol (HECTOROL) 4 MCG/2ML injection Inject 1 mL (2 mcg total) into the vein every Monday, Wednesday, and Friday with hemodialysis. 03/22/17   Bonnielee Haff, MD  feeding supplement (BOOST / RESOURCE BREEZE) LIQD Take 1 Container by mouth 3 (three) times daily between meals. Patient not taking: Reported on 09/08/2017 03/21/17   Bonnielee Haff, MD  HYDROcodone-acetaminophen [facility]) 7.5-325 MG tablet Take 1-2 tablets by mouth every 6 (six) hours as needed for moderate pain. Patient not taking: Reported on 08/23/2017 03/18/17   Leandrew Koyanagi, MD  methocarbamol (ROBAXIN) 500 MG tablet Take 1 tablet (500 mg total) by mouth every 6 (six) hours as needed for muscle spasms. Patient not taking: Reported on 09/01/2017 03/21/17   Bonnielee Haff, MD  silver sulfADIAZINE (SILVADENE) 1 % cream Apply 1 application topically daily. Patient not taking: Reported on 09/01/2017 08/24/17   Suzan Slick, NP   No Known Allergies Review of Systems  Unable to perform ROS   Physical Exam  Constitutional: He appears lethargic. He appears cachectic. He appears ill.  Cardiovascular: Tachycardia present.  Pulmonary/Chest: He has decreased breath sounds in the right lower field and the left lower field.  Neurological: He appears lethargic.  Skin: Skin is warm and dry.  Skin changes right lower extremity    Vital Signs: BP 96/60   Pulse (!) 143   Temp 98.7 F (37.1 C) (Axillary)   Resp 14   Ht _0  (1.651 m)   Wt 53.4 kg (117 lb 11.6 oz)   SpO2 100%   BMI 19.59 kg/m  Pain  Assessment: Faces       SpO2: SpO2: 100 % O2 Device:SpO2: 100 % O2 Flow Rate: .O2 Flow Rate (L/min): 6 L/min  IO: Intake/output summary:   Intake/Output Summary (Last 24 hours) at 09/15/2017 1015 Last data filed at 09/02/2017 1000 Gross per 24 hour  Intake  1236.36 ml  Output -  Net 1236.36 ml    LBM: Last BM Date: (PTA) Baseline Weight: Weight: 54.4 kg (120 lb) Most recent weight: Weight: 53.4 kg (117 lb 11.6 oz)     Palliative Assessment/Data: 20 %   Discussed with Marni Griffon NP/CCM and bedside nurse  Time In: 1000 Time Out: 1130 Time Total: 90 minutes Greater than 50%  of this time was spent counseling and coordinating care related to the above assessment and plan.  Signed by: Wadie Lessen, NP   Please contact Palliative Medicine Team phone at [phone] for questions and concerns.  For individual provider: See Shea Evans

## 2017-09-11 NOTE — Progress Notes (Signed)
Initial Nutrition Assessment  DOCUMENTATION CODES:   Non-severe (moderate) malnutrition in context of chronic illness  INTERVENTION:   When diet advanced, add Ensure Enlive po BID, each supplement provides 350 kcal and 20 grams of protein  NUTRITION DIAGNOSIS:   Moderate Malnutrition related to chronic illness(dementia) as evidenced by moderate fat depletion, mild muscle depletion, moderate muscle depletion.  GOAL:   Patient will meet greater than or equal to 90% of their needs  MONITOR:   Diet advancement, PO intake, Supplement acceptance, Skin, Labs  REASON FOR ASSESSMENT:   Malnutrition Screening Tool    ASSESSMENT:   83 yo male with PMH of HLD, anemia, HTN, ESRD-HD, NSTEMI, asthma, SOB, HOH, DM, dementia, GERD who was admitted on 12/23 with sepsis due to gangrenous RLE. Plans for R AKA on 12/24.  Discussed patient with RN. Family is meeting with Palliative Care team to decide on overall goals of care. May or may not have R AKA today.   Labs reviewed. Potassium 3.4 (L), phosphorus 1.6 (L), magnesium 1.6 (L) CBG's: [id number] Medications reviewed and include potassium phosphate.   NUTRITION - FOCUSED PHYSICAL EXAM:    Most Recent Value  Orbital Region  Moderate depletion  Upper Arm Region  Moderate depletion  Thoracic and Lumbar Region  Moderate depletion  Buccal Region  Moderate depletion  Temple Region  Moderate depletion  Clavicle Bone Region  Moderate depletion  Clavicle and Acromion Bone Region  Moderate depletion  Scapular Bone Region  Mild depletion  Dorsal Hand  Unable to assess  Patellar Region  Unable to assess  Anterior Thigh Region  Unable to assess  Posterior Calf Region  Unable to assess  Edema (RD Assessment)  Unable to assess  Hair  Reviewed  Eyes  Unable to assess  Mouth  Unable to assess  Skin  Reviewed  Nails  Unable to assess       Diet Order:  Diet NPO time specified  EDUCATION NEEDS:   No education needs have been  identified at this time  Skin:  Skin Assessment: Skin Integrity Issues: Skin Integrity Issues:: Diabetic Ulcer Diabetic Ulcer: R heel  Last BM:  PTA  Height:   Ht Readings from Last 1 Encounters:  09/01/2017 5\' 5"  (1.651 m)    Weight:   Wt Readings from Last 1 Encounters:  09/18/2017 117 lb 11.6 oz (53.4 kg)    Ideal Body Weight:  61.8 kg  BMI:  Body mass index is 19.59 kg/m.  Estimated Nutritional Needs:   Kcal:  1600-1800  Protein:  85-95 gm  Fluid:  1.6-1.8 L   Molli Barrows, RD, LDN, [place] Pager [phone] After Hours Pager [phone]

## 2017-09-11 NOTE — Progress Notes (Signed)
Pharmacy Antibiotic Note  Steven Forbes is a 83 y.o. male admitted on 08/27/2017 with sepsis.  Pharmacy has been consulted for Vancomycin/Zosyn dosing. ESRD on HD MWF. Did get HD today (Sunday) due to holiday HD schedule.  Plan: Vancomycin 1000 mg IV x 1 already given, then 500 mg IV qHD MWF (starting Wed 12/26 due to holiday HD schedule) Zosyn 3.375G IV q12h to be infused over 4 hours Trend WBC, temp, HD schedule F/U infectious work-up Drug levels as indicated   Height: 5\' 5"  (165.1 cm) Weight: 113 lb 12.1 oz (51.6 kg) IBW/kg (Calculated) : 61.5  Temp (24hrs), Avg:97.4 F (36.3 C), Min:97.2 F (36.2 C), Max:97.6 F (36.4 C)  Recent Labs  Lab 09/12/2017 1802 09/15/2017 1811 08/25/2017 1936 09/03/2017 2215  WBC 20.9*  --   --   --   CREATININE 1.58*  --   --   --   LATICACIDVEN  --  7.49* 7.33* 7.4*    Estimated Creatinine Clearance: 23.1 mL/min (A) (by C-G formula based on SCr of 1.58 mg/dL (H)).    No Known Allergies  Steven Forbes 09/02/2017 12:05 AM

## 2017-09-11 NOTE — Procedures (Signed)
Intubation Procedure Note Steven Forbes [id number] 10-29-1925  Procedure: Intubation Indications: Airway protection and maintenance  Procedure Details Consent: Risks of procedure as well as the alternatives and risks of each were explained to the (patient/caregiver).  Consent for procedure obtained. Time Out: Verified patient identification, verified procedure, site/side was marked, verified correct patient position, special equipment/implants available, medications/allergies/relevent history reviewed, required imaging and test results available.  Performed  Maximum sterile technique was used including antiseptics, cap, gloves, gown, hand hygiene, mask and sheet.  Glide scope, blade 3. 1 attempt. Grade 1 view  Evaluation Hemodynamic Status: BP stable throughout; O2 sats: stable throughout Patient's Current Condition: stable Complications: No apparent complications Patient did tolerate procedure well. Chest X-ray ordered to verify placement.  CXR: pending.   Steven Forbes 09/09/2017

## 2017-09-12 LAB — GLUCOSE, CAPILLARY
GLUCOSE-CAPILLARY: 102 mg/dL — AB (ref 65–99)
GLUCOSE-CAPILLARY: 129 mg/dL — AB (ref 65–99)
GLUCOSE-CAPILLARY: 91 mg/dL (ref 65–99)
Glucose-Capillary: 140 mg/dL — ABNORMAL HIGH (ref 65–99)
Glucose-Capillary: 69 mg/dL (ref 65–99)
Glucose-Capillary: 89 mg/dL (ref 65–99)
Glucose-Capillary: 97 mg/dL (ref 65–99)

## 2017-09-12 LAB — RENAL FUNCTION PANEL
ALBUMIN: 1.5 g/dL — AB (ref 3.5–5.0)
Anion gap: 10 (ref 5–15)
BUN: 8 mg/dL (ref 6–20)
CHLORIDE: 100 mmol/L — AB (ref 101–111)
CO2: 22 mmol/L (ref 22–32)
CREATININE: 2.2 mg/dL — AB (ref 0.61–1.24)
Calcium: 7 mg/dL — ABNORMAL LOW (ref 8.9–10.3)
GFR, EST AFRICAN AMERICAN: 29 mL/min — AB (ref >60)
GFR, EST NON AFRICAN AMERICAN: 25 mL/min — AB (ref >60)
Glucose, Bld: 107 mg/dL — ABNORMAL HIGH (ref 65–99)
PHOSPHORUS: 3.2 mg/dL (ref 2.5–4.6)
Potassium: 3.5 mmol/L (ref 3.5–5.1)
Sodium: 132 mmol/L — ABNORMAL LOW (ref 135–145)

## 2017-09-12 LAB — CBC
HEMATOCRIT: 27 % — AB (ref 39.0–52.0)
Hemoglobin: 9.1 g/dL — ABNORMAL LOW (ref 13.0–17.0)
MCH: 29.9 pg (ref 26.0–34.0)
MCHC: 33.7 g/dL (ref 30.0–36.0)
MCV: 88.8 fL (ref 78.0–100.0)
PLATELETS: 230 10*3/uL (ref 150–400)
RBC: 3.04 MIL/uL — AB (ref 4.22–5.81)
RDW: 19.4 % — ABNORMAL HIGH (ref 11.5–15.5)
WBC: 21.3 10*3/uL — AB (ref 4.0–10.5)

## 2017-09-12 LAB — PROCALCITONIN: PROCALCITONIN: 4.82 ng/mL

## 2017-09-12 MED ORDER — DEXTROSE 5 % IV SOLN
INTRAVENOUS | Status: DC
  Administered 2017-09-12: 13:00:00 via INTRAVENOUS

## 2017-09-12 MED ORDER — VASOPRESSIN 20 UNIT/ML IV SOLN
0.0300 [IU]/min | INTRAVENOUS | Status: DC
  Administered 2017-09-12 – 2017-09-13 (×2): 0.03 [IU]/min via INTRAVENOUS
  Filled 2017-09-12 (×3): qty 2

## 2017-09-12 MED ORDER — SODIUM CHLORIDE 0.9 % IV SOLN
0.0000 ug/min | INTRAVENOUS | Status: DC
  Administered 2017-09-12: 300 ug/min via INTRAVENOUS
  Administered 2017-09-12: 350 ug/min via INTRAVENOUS
  Administered 2017-09-12: 330 ug/min via INTRAVENOUS
  Administered 2017-09-12: 300 ug/min via INTRAVENOUS
  Administered 2017-09-13: 290 ug/min via INTRAVENOUS
  Administered 2017-09-13: 190 ug/min via INTRAVENOUS
  Administered 2017-09-13: 320 ug/min via INTRAVENOUS
  Administered 2017-09-14: 40 ug/min via INTRAVENOUS
  Filled 2017-09-12 (×9): qty 8

## 2017-09-12 MED ORDER — VANCOMYCIN HCL 500 MG IV SOLR
500.0000 mg | Freq: Once | INTRAVENOUS | Status: AC
  Administered 2017-09-12: 500 mg via INTRAVENOUS
  Filled 2017-09-12: qty 500

## 2017-09-12 MED ORDER — HYDROCORTISONE NA SUCCINATE PF 100 MG IJ SOLR
50.0000 mg | Freq: Four times a day (QID) | INTRAMUSCULAR | Status: DC
  Administered 2017-09-12 – 2017-09-19 (×29): 50 mg via INTRAVENOUS
  Filled 2017-09-12 (×21): qty 1
  Filled 2017-09-12: qty 2
  Filled 2017-09-12 (×8): qty 1

## 2017-09-12 MED ORDER — DEXTROSE 50 % IV SOLN
25.0000 mL | Freq: Once | INTRAVENOUS | Status: AC
  Administered 2017-09-12: 25 mL via INTRAVENOUS
  Filled 2017-09-12: qty 50

## 2017-09-12 NOTE — Progress Notes (Signed)
HD tx initiated via HD cath w/o problem, pull/push/flush equally w/o problem, VSS but w/ soft bp already on Neo and Levo drips, primary nurse will titrate drips to keep sbp >90 per Nephrologist order, will cont to monitor while on HD tx

## 2017-09-12 NOTE — Progress Notes (Signed)
Pharmacy Antibiotic Note  Steven Forbes is a 83 y.o. male admitted on 09/09/2017 with sepsis.  Pharmacy has been consulted for Vancomycin/Zosyn dosing. ESRD on HD MWF. S/p AKA 12/24 and pt did receive HD early this AM.   Plan: Give vancomycin 500mg  IV x 1 now then continue vanc 500mg  IV post-HD F/u renal plans, C&S, clinical status and pre-HD vanc levels as appropriate  Height: 5\' 5"  (165.1 cm) Weight: 115 lb 4.8 oz (52.3 kg) IBW/kg (Calculated) : 61.5  Temp (24hrs), Avg:97.3 F (36.3 C), Min:96.7 F (35.9 C), Max:98.1 F (36.7 C)  Recent Labs  Lab 08/20/2017 1802 08/30/2017 1811 08/26/2017 1936 09/04/2017 2215 08/26/2017 0213 09/12/17 0240  WBC 20.9*  --   --   --  30.8* 21.3*  CREATININE 1.58*  --   --   --  1.53* 2.20*  LATICACIDVEN  --  7.49* 7.33* 7.4* 5.0*  --     Estimated Creatinine Clearance: 16.8 mL/min (A) (by C-G formula based on SCr of 2.2 mg/dL (H)).    No Known Allergies  Steven Forbes, Steven Forbes 09/12/2017 10:32 AM

## 2017-09-12 NOTE — Progress Notes (Signed)
[facility] Progress Note  Subjective: unable to complete HD last night due to BP's in 70's, maxed on 3 pressors.   Vitals:   09/12/17 1015 09/12/17 1030 09/12/17 1045 09/12/17 1100  BP:    98/62  Pulse:      Resp: _0 Temp:      TempSrc:      SpO2:      Weight:      Height:        Inpatient medications: . chlorhexidine gluconate (MEDLINE KIT)  15 mL Mouth Rinse BID  . heparin  5,000 Units Subcutaneous Q8H  . hydrocortisone sod succinate (SOLU-CORTEF) inj  50 mg Intravenous Q6H  . insulin aspart  2-6 Units Subcutaneous Q4H  . mouth rinse  15 mL Mouth Rinse QID  . pantoprazole (PROTONIX) IV  40 mg Intravenous Q24H   . sodium chloride    . sodium chloride    . sodium chloride    . sodium chloride    . diltiazem (CARDIZEM) infusion 10 mg/hr (09/12/17 0547)  . norepinephrine (LEVOPHED) Adult infusion 16 mcg/min (09/12/17 0925)  . phenylephrine (NEO-SYNEPHRINE) Adult infusion 300 mcg/min (09/12/17 0858)  . piperacillin-tazobactam (ZOSYN)  IV 3.375 g (09/12/17 0928)  . [START ON 09/13/2017] vancomycin    . vancomycin    . vasopressin (PITRESSIN) infusion - *FOR SHOCK* 0.03 Units/min (09/12/17 0424)   sodium chloride, Place/Maintain arterial line **AND** sodium chloride, sodium chloride, sodium chloride, alteplase, fentaNYL (SUBLIMAZE) injection, fentaNYL (SUBLIMAZE) injection, heparin, lidocaine (PF), lidocaine-prilocaine, LORazepam, pentafluoroprop-tetrafluoroeth  Exam: On vent , not responding ETT in place Chest scatt rhonchi, no wheezing RRR tachy no RG Abd nondist, dec'd BS Ext diffuse edema 1-2+  R IJ TDC    Dialysis: [facility] MWF  4h   51.5kg  3K/2.25 bath  Hep 2500   R IJ cath 180NRe 400/auto 1.5   UFP 4 -Mircera 50 mcg IV q 2 weeks (last dose 08/31/17 Last HGB 9.0 09/06/17)        Impression: 1. Sepsis/ gangrenous leg - sp R AKA 12/24 per ortho 2. Shock / hypotension - worse, on 3 pressors now.  3. ESRD - usual HD is MWF.  Didn't  tolerate HD last night due to hypotension. Not CRRT candidate.  Will follow.  4. AFib RVR 5. Anemia of CKD  - HGB 8.5. Ordered darbe 40 ug SQ since not getting HD today.   6. Metabolic bone disease -  No binders/VDRA. Ca 7 C Ca 8.9 7.  Nutrition - Severe PCM  8. DM: per primary 9. DNR   Plan - unable to dialyze patient due to severe shock.  Will reassess daily for improvement.   Kelly Splinter MD [facility] pager [phone]   09/12/2017, 11:35 AM   Recent Labs  Lab 08/25/2017 1802 08/20/2017 0213 09/16/2017 1516 09/12/17 0240  NA 137 137 134* 132*  K 4.2 3.4* 3.6 3.5  CL 102 102  --  100*  CO2 23 23  --  22  GLUCOSE 199* 149*  --  107*  BUN 6 6  --  8  CREATININE 1.58* 1.53*  --  2.20*  CALCIUM 7.0* 7.1*  --  7.0*  PHOS  --  1.6*  --  3.2   Recent Labs  Lab 08/26/2017 1802 09/12/17 0240  AST 47*  --   ALT 40  --   ALKPHOS 151*  --   BILITOT 0.9  --   PROT 4.5*  --   ALBUMIN  1.2* 1.5*   Recent Labs  Lab 08/27/2017 1802 08/25/2017 0213 09/14/2017 1516 09/12/17 0240  WBC 20.9* 30.8*  --  21.3*  NEUTROABS 18.2*  --   --   --   HGB 8.5* 9.2* 8.8* 9.1*  HCT 26.5* 28.5* 26.0* 27.0*  MCV 94.0 93.1  --  88.8  PLT 174 178  --  230   Iron/TIBC/Ferritin/ %Sat    Component Value Date/Time   IRON 31 (L) 05/27/2011 0757   TIBC 170 (L) 05/27/2011 0757   FERRITIN 840 (H) 05/27/2011 0757   IRONPCTSAT 18 (L) 05/27/2011 0757

## 2017-09-12 NOTE — Progress Notes (Signed)
     Subjective: 1 Day Post-Op Procedure(s) (LRB): AMPUTATION ABOVE KNEE (Right) Ventilator, Daughter at bedside. Patient reports pain as Intubated, not responsive to verbal or physical stimuli.    Objective:   VITALS:  Temp:  [96.7 F (35.9 C)-98.1 F (36.7 C)] 98 F (36.7 C) (12/25 0821) Pulse Rate:  [101-136] 102 (12/25 0900) Resp:  [8-24] 16 (12/25 0945) BP: (71-140)/(23-85) 94/64 (12/25 0900) SpO2:  [75 %-100 %] 100 % (12/25 0900) Arterial Line BP: (79-135)/(33-62) 101/41 (12/25 0945) FiO2 (%):  [40 %-100 %] 40 % (12/25 0821) Weight:  [114 lb 3.2 oz (51.8 kg)-115 lb 4.8 oz (52.3 kg)] 115 lb 4.8 oz (52.3 kg) (12/25 0450)  Incision: dressing C/D/I   LABS Recent Labs    09/09/2017 1802 09/03/2017 0213 08/30/2017 1516 09/12/17 0240  HGB 8.5* 9.2* 8.8* 9.1*  WBC 20.9* 30.8*  --  21.3*  PLT 174 178  --  230   Recent Labs    08/24/2017 0213 09/09/2017 1516 09/12/17 0240  NA 137 134* 132*  K 3.4* 3.6 3.5  CL 102  --  100*  CO2 23  --  22  BUN 6  --  8  CREATININE 1.53*  --  2.20*  GLUCOSE 149*  --  107*   No results for input(s): LABPT, INR in the last 72 hours.   Assessment/Plan: 1 Day Post-Op Procedure(s) (LRB): AMPUTATION ABOVE KNEE (Right)  Supportive care. Stable right AKA dressing and limb. Check h/h  Basil Dess 09/12/2017, 10:13 AMPatient ID: Steven Forbes, male   DOB: Jun 25, 1926, 83 y.o.   MRN: [id number]

## 2017-09-12 NOTE — Progress Notes (Signed)
HD tx stopped w/ 1 hr 10 min left d/t bp sustaining in the 70's and HR sustaining in 120-130s not responsive to interventions and being on 3 pressors maxed. Dr. Moshe Cipro was paged and she promptly returned page and was made aware of situation at 0435 and order was given to stop tx. UF goal (keep even) not met, blood rinsed back, VSS and bp returned to normal once tx was stopped. Report given to Manya Silvas, RN

## 2017-09-12 NOTE — Progress Notes (Addendum)
PULMONARY / CRITICAL CARE MEDICINE   Name: Steven Forbes MRN: [id number] DOB: 08-Sep-1926    ADMISSION DATE:  09/17/2017 CONSULTATION DATE:  09/09/2017  REFERRING MD:  Dr. Ericka Pontiff   CHIEF COMPLAINT:  AMS   HISTORY OF PRESENT ILLNESS:   83 year old male with extensive PMH including dementia, DM, ESRD on HD M/W/F, GERD, Anemia, HLD, HTN, Aortic Stenosis, right transmetatarsal amputation, and known gangrenous right leg  Presents to ED from home with progressive lethargy. Per family patient was scheduled January 2 for AKA of right leg. Presented hypotensive, LA 7.49, given 2 L Bolus, Vancomycin/Zosyn and started on Levophed Gtt. PCCM called for admission.    Orthopedics was consulted. Agree with surgery 12/24. However, have discussed with family high risk due to multiple co-morbidites and current state of health.  PAST MEDICAL HISTORY :  He  has a past medical history of Anemia, Asthma, Dementia, Diabetes mellitus, ESRD (end stage renal disease) ([place]), Flu (08/2014), GERD (gastroesophageal reflux disease), History of blood transfusion, HOH (hard of hearing), echocardiogram, non-ST elevation myocardial infarction (NSTEMI), Hyperlipidemia, Hypertension, and Shortness of breath.  PAST SURGICAL HISTORY: He  has a past surgical history that includes Laparoscopic gastrotomy w/ repair of ulcer; AV fistula placement (2008); Toe amputation (Right); Revison of arteriovenous fistula (Left, 11/21/2013); Eye surgery (Bilateral); Colonoscopy; Revison of arteriovenous fistula (Left, 02/18/2016); and Anterior approach hemi hip arthroplasty (Left, 03/18/2017).  No Known Allergies  No current facility-administered medications on file prior to encounter.    Current Outpatient Medications on File Prior to Encounter  Medication Sig  . acetaminophen (TYLENOL) 500 MG tablet Take 500 mg by mouth at bedtime.  [name] albuterol (PROAIR HFA) 108 (90 Base) MCG/ACT inhaler Inhale 2 puffs into the lungs 2 (two) times daily.  [name]  atorvastatin (LIPITOR) 80 MG tablet Take 80 mg by mouth at bedtime.   . [facility]S ASPIRIN LOW DOSE 81 MG EC tablet TAKE 1 TABLET (81 MG TOTAL) BY MOUTH DAILY. NEED OV. (Patient taking differently: Take 81 mg by mouth daily. )  . doxycycline (VIBRA-TABS) 100 MG tablet TAKE 1 TABLET BY MOUTH TWICE A DAY (Patient taking differently: TAKE 1 TABLET (100 MG) BY MOUTH TWICE A DAY (#30 FILLED 08/29/17))  . ENSURE (ENSURE) Take 237 mLs by mouth daily.  . multivitamin (RENA-VIT) TABS tablet Take 1 tablet by mouth at bedtime.  [name] omeprazole (PRILOSEC) 20 MG capsule Take 1 capsule (20 mg total) by mouth daily. (Patient taking differently: Take 20 mg by mouth at bedtime. )  . acetaminophen (TYLENOL) 325 MG tablet Take 2 tablets (650 mg total) by mouth every 6 (six) hours as needed for mild pain (or Fever >/= 101). (Patient not taking: Reported on 09/06/2017)  . darbepoetin (ARANESP) 100 MCG/0.5ML SOLN injection Inject 0.5 mLs (100 mcg total) into the vein every Monday with hemodialysis.  [name] doxercalciferol (HECTOROL) 4 MCG/2ML injection Inject 1 mL (2 mcg total) into the vein every Monday, Wednesday, and Friday with hemodialysis.  . feeding supplement (BOOST / RESOURCE BREEZE) LIQD Take 1 Container by mouth 3 (three) times daily between meals. (Patient not taking: Reported on 08/31/2017)  . HYDROcodone-acetaminophen (NORCO) 7.5-325 MG tablet Take 1-2 tablets by mouth every 6 (six) hours as needed for moderate pain. (Patient not taking: Reported on 08/26/2017)  . methocarbamol (ROBAXIN) 500 MG tablet Take 1 tablet (500 mg total) by mouth every 6 (six) hours as needed for muscle spasms. (Patient not taking: Reported on 08/23/2017)  . silver sulfADIAZINE (SILVADENE) 1 % cream Apply 1  application topically daily. (Patient not taking: Reported on 08/28/2017)    FAMILY HISTORY:  His indicated that his mother is deceased. He indicated that his father is deceased. He indicated that the status of his son is unknown. He indicated  that the status of his cousin is unknown.   SOCIAL HISTORY: He  reports that  has never smoked. he has never used smokeless tobacco. He reports that he does not drink alcohol or use drugs.  REVIEW OF SYSTEMS:   Unable to review as patient is encephalopathic   SUBJECTIVE:  Intubated yesterday. CVL, a line placed Had right AKA done On cardizem for a fib  VITAL SIGNS: BP 94/64   Pulse (!) 102   Temp 98 F (36.7 C) (Axillary)   Resp 16   Ht 5\' 5"  (1.651 m)   Wt 115 lb 4.8 oz (52.3 kg)   SpO2 100%   BMI 19.19 kg/m   HEMODYNAMICS: CVP:  [5 mmHg-7 mmHg] 7 mmHg  VENTILATOR SETTINGS: Vent Mode: PRVC FiO2 (%):  [40 %-100 %] 40 % Set Rate:  [16 bmp] 16 bmp Vt Set:  [500 mL] 500 mL PEEP:  [5 cmH20] 5 cmH20 Plateau Pressure:  [17 cmH20-25 cmH20] 20 cmH20  INTAKE / OUTPUT: I/O last 3 completed shifts: In: 3715.5 [I.V.:2295.5; Other:520; IV VOHYWVPXT:062] Out: -439 [Blood:50]  PHYSICAL EXAMINATION: Gen:      No acute distress, frail elderly HEENT:  EOMI, sclera anicteric, ETT in place Neck:     No masses; no thyromegaly Lungs:    Clear to auscultation bilaterally; normal respiratory effort CV:         Regular rate and rhythm; no murmurs Abd:      + bowel sounds; soft, non-tender; no palpable masses, no distension Ext:    Rt AKA bandaged Skin:      Warm and dry; no rash Neuro: Sedated, unresponsive  LABS:  BMET Recent Labs  Lab 09/01/2017 1802 09/18/2017 0213 09/18/2017 1516 09/12/17 0240  NA 137 137 134* 132*  K 4.2 3.4* 3.6 3.5  CL 102 102  --  100*  CO2 23 23  --  22  BUN 6 6  --  8  CREATININE 1.58* 1.53*  --  2.20*  GLUCOSE 199* 149*  --  107*    Electrolytes Recent Labs  Lab 09/05/2017 1802 08/29/2017 0213 09/12/17 0240  CALCIUM 7.0* 7.1* 7.0*  MG  --  1.6*  --   PHOS  --  1.6* 3.2    CBC Recent Labs  Lab 09/16/2017 1802 09/04/2017 0213 09/16/2017 1516 09/12/17 0240  WBC 20.9* 30.8*  --  21.3*  HGB 8.5* 9.2* 8.8* 9.1*  HCT 26.5* 28.5* 26.0* 27.0*  PLT  174 178  --  230    Coag's No results for input(s): APTT, INR in the last 168 hours.  Sepsis Markers Recent Labs  Lab 08/28/2017 1936 09/12/2017 2215 09/16/2017 0213 09/12/17 0241  LATICACIDVEN 7.33* 7.4* 5.0*  --   PROCALCITON  --   --   --  4.82    ABG Recent Labs  Lab 09/02/2017 1846 09/01/2017 1516 08/22/2017 1559  PHART 7.566* 7.518* 7.488*  PCO2ART 25.9* 25.6* 28.6*  PO2ART 133.0* 436.0* 408.0*    Liver Enzymes Recent Labs  Lab 08/30/2017 1802 09/12/17 0240  AST 47*  --   ALT 40  --   ALKPHOS 151*  --   BILITOT 0.9  --   ALBUMIN 1.2* 1.5*    Cardiac Enzymes Recent Labs  Lab 08/24/2017  2215 09/05/2017 0213  TROPONINI 0.38* 0.36*    Glucose Recent Labs  Lab 09/15/2017 1948 08/31/2017 2354 09/12/17 0029 09/12/17 0330 09/12/17 0817 09/12/17 0820  GLUCAP 140* 67 140* 91 <10* 89    Imaging Dg Chest Port 1 View  Result Date: 09/17/2017 CLINICAL DATA:  Endotracheal intubation.  Asthma. EXAM: PORTABLE CHEST 1 VIEW COMPARISON:  08/23/2017 FINDINGS: Interval placement of an endotracheal tube, the tip of which is 2.7 cm above the carina. Right IJ dialysis catheter tip: Right atrium. Left IJ central line tip: Lower SVC. Nasogastric tube tip: Stomach fundus with side port at the gastric cardia. Mild enlargement of the cardiopericardial silhouette with atherosclerotic calcification of the aortic arch. Fine interstitial accentuation in the lungs favoring the lung bases. Suspected mild scarring at the right lung base. No appreciable pleural effusion. No overt edema. IMPRESSION: 1. Tubes and lines in satisfactorily position. Endotracheal tube tip is 2.7 cm above the carina. 2. Mild enlargement of the cardiopericardial silhouette. Aortic Atherosclerosis (ICD10-I70.0). 3. Suspected mild scarring at the right lung base. 4. Fine chronic interstitial accentuation, stable. Electronically Signed   By: Van Clines M.D.   On: 08/27/2017 13:34   Dg Abd Portable 1v  Result Date:  08/29/2017 CLINICAL DATA:  Evaluate OG tube placement EXAM: PORTABLE ABDOMEN - 1 VIEW COMPARISON:  None. FINDINGS: The OG tube terminates in the stomach. Air-filled nondilated loops of bowel identified. No other acute abnormalities. IMPRESSION: The OG tube is in good position. Electronically Signed   By: Dorise Bullion III M.D   On: 09/02/2017 13:31  I have reviewed the images personally  STUDIES:   CULTURES: Blood 12/23 >> Neg  ANTIBIOTICS: Vancomycin 12/23 >> Zosyn 12/23 >>   SIGNIFICANT EVENTS: 12/23 > Presents to ED   LINES/TUBES: ETT 12/24 > Lt CVL IJ 12/24 > Lt femoral a line 12/24 >  DISCUSSION: 83 year old male with known gangrenous right leg, seen by Dr. Sharol Given in office, planned above knee amputation January 2. Presents to ED septic  Underwent rt leg amputation 12/24   ASSESSMENT / PLAN:  PULMONARY A: Acute resp failure P:   Continue full vent support No plans on weaning  CARDIOVASCULAR A:  Septic Shock in setting of gangrenous right leg Demand Ischemia Tn1 0.36 Aortic Stenosis (EF on 03/17/17 50-55)  H/O HLD, HTN,  PVD S/P transmetatarsal amputation of right foot   P:  Continue pressors Add stress dose steroids  RENAL A:   Lactic Acidosis in setting of septic shock  ESRD, HD M/W/F  P:   Not a candidate for CVVH given hemodynamic instability Nephrology following  Replace lytes as needed  GASTROINTESTINAL A:   GERD Protein Calorie Malnutrition  P:   Keep NPO  PPI   HEMATOLOGIC A:   Anemia of Chronic disease   P:  Follow CBC. Transfuse for Hb < 7  INFECTIOUS A:   Septic Shock in setting of gangrenous right leg  S/p rt leg amputation P:   On Vanc and zosyn. Day 3  ENDOCRINE A:   DM    P:   SSI protocol  NEUROLOGIC A:   Metabolic Encephalopathy  H/O Dementia  P:   Supportive care  FAMILY: Family updated by CCM team and palliative care. We are maxed out on interventions now. I have discssed with the daughter at bedside. We  will continue treatment as it is for now but prognosis is dismal. No CPR if he codes.  The patient is critically ill with multiple organ  system failure and requires high complexity decision making for assessment and support, frequent evaluation and titration of therapies, advanced monitoring, review of radiographic studies and interpretation of complex data.   Critical Care Time devoted to patient care services, exclusive of separately billable procedures, described in this note is 35 minutes.   Marshell Garfinkel MD [facility] Pager [phone] If no answer or after 3pm call: [phone] 09/12/2017, 10:22 AM

## 2017-09-13 ENCOUNTER — Encounter (HOSPITAL_COMMUNITY): Payer: Self-pay | Admitting: Orthopaedic Surgery

## 2017-09-13 LAB — BLOOD GAS, ARTERIAL
ACID-BASE DEFICIT: 11.7 mmol/L — AB (ref 0.0–2.0)
BICARBONATE: 12.5 mmol/L — AB (ref 20.0–28.0)
Drawn by: 398331
FIO2: 40
O2 SAT: 95.8 %
PATIENT TEMPERATURE: 98.6
PCO2 ART: 21.3 mmHg — AB (ref 32.0–48.0)
PEEP: 5 cmH2O
PH ART: 7.384 (ref 7.350–7.450)
PO2 ART: 86.5 mmHg (ref 83.0–108.0)
PRESSURE SUPPORT: 5 cmH2O

## 2017-09-13 LAB — CBC
HCT: 26.5 % — ABNORMAL LOW (ref 39.0–52.0)
HEMOGLOBIN: 8.5 g/dL — AB (ref 13.0–17.0)
MCH: 29.7 pg (ref 26.0–34.0)
MCHC: 32.1 g/dL (ref 30.0–36.0)
MCV: 92.7 fL (ref 78.0–100.0)
Platelets: 161 10*3/uL (ref 150–400)
RBC: 2.86 MIL/uL — AB (ref 4.22–5.81)
RDW: 19.6 % — ABNORMAL HIGH (ref 11.5–15.5)
WBC: 39.5 10*3/uL — ABNORMAL HIGH (ref 4.0–10.5)

## 2017-09-13 LAB — GLUCOSE, CAPILLARY
Glucose-Capillary: 101 mg/dL — ABNORMAL HIGH (ref 65–99)
Glucose-Capillary: 110 mg/dL — ABNORMAL HIGH (ref 65–99)
Glucose-Capillary: 138 mg/dL — ABNORMAL HIGH (ref 65–99)
Glucose-Capillary: 148 mg/dL — ABNORMAL HIGH (ref 65–99)
Glucose-Capillary: 154 mg/dL — ABNORMAL HIGH (ref 65–99)
Glucose-Capillary: 99 mg/dL (ref 65–99)

## 2017-09-13 LAB — COMPREHENSIVE METABOLIC PANEL
ALBUMIN: 1.2 g/dL — AB (ref 3.5–5.0)
ALK PHOS: 129 U/L — AB (ref 38–126)
ALT: 33 U/L (ref 17–63)
ANION GAP: 19 — AB (ref 5–15)
AST: 87 U/L — ABNORMAL HIGH (ref 15–41)
BUN: 9 mg/dL (ref 6–20)
CALCIUM: 6.6 mg/dL — AB (ref 8.9–10.3)
CHLORIDE: 101 mmol/L (ref 101–111)
CO2: 12 mmol/L — AB (ref 22–32)
Creatinine, Ser: 2.25 mg/dL — ABNORMAL HIGH (ref 0.61–1.24)
GFR calc Af Amer: 28 mL/min — ABNORMAL LOW (ref >60)
GFR calc non Af Amer: 24 mL/min — ABNORMAL LOW (ref >60)
GLUCOSE: 104 mg/dL — AB (ref 65–99)
Potassium: 4.4 mmol/L (ref 3.5–5.1)
SODIUM: 132 mmol/L — AB (ref 135–145)
Total Bilirubin: 1.6 mg/dL — ABNORMAL HIGH (ref 0.3–1.2)
Total Protein: 4.4 g/dL — ABNORMAL LOW (ref 6.5–8.1)

## 2017-09-13 LAB — HEPATITIS B SURFACE ANTIBODY,QUALITATIVE: HEP B S AB: NONREACTIVE

## 2017-09-13 LAB — HEPATITIS B SURFACE ANTIGEN: HEP B S AG: NEGATIVE

## 2017-09-13 LAB — PROCALCITONIN: PROCALCITONIN: 6.07 ng/mL

## 2017-09-13 MED ORDER — IPRATROPIUM-ALBUTEROL 0.5-2.5 (3) MG/3ML IN SOLN
3.0000 mL | RESPIRATORY_TRACT | Status: DC | PRN
  Administered 2017-09-13: 3 mL via RESPIRATORY_TRACT
  Filled 2017-09-13: qty 3

## 2017-09-13 MED ORDER — IPRATROPIUM-ALBUTEROL 0.5-2.5 (3) MG/3ML IN SOLN
RESPIRATORY_TRACT | Status: AC
  Filled 2017-09-13: qty 3

## 2017-09-13 MED ORDER — SODIUM BICARBONATE 8.4 % IV SOLN
INTRAVENOUS | Status: DC
  Administered 2017-09-13 – 2017-09-14 (×3): via INTRAVENOUS
  Filled 2017-09-13 (×4): qty 150

## 2017-09-13 MED ORDER — FENTANYL CITRATE (PF) 100 MCG/2ML IJ SOLN
50.0000 ug | INTRAMUSCULAR | Status: DC | PRN
  Administered 2017-09-14 – 2017-09-17 (×4): 50 ug via INTRAVENOUS
  Filled 2017-09-13 (×5): qty 2

## 2017-09-13 NOTE — Progress Notes (Signed)
Long d/w family See progress note.   Problem list: Refractory Circulatory shock-->likely d/t progressive acidosis superimposed on sepsis.  Acute encephalopathy superimposed on dementia Worsening acidosis CRI FTT  Discussion Discussed case w/ nephrology. Not a candidate for further HD. He will not survive w/out this. He does however have the minute ventilation and vent mechanics to support extubation. Ideally we hope to extubate him so that he has an opportunity to interact w/ his family. If his acidosis and shock continue he will eventually die as a consequence of his renal failure. The family is aware of this and do not want him to die on a ventilator.   Plan Extubate Cont current pressors Cont bicarb gtt Full DNR/DNI PRN fent for pain and work of breathing  -will see how he does. If he were to get off pressors we could again consider HD but I doubt that this will happen. Family aware that we could end up in a comfort care situation if things continue to go as they have been.   Erick Colace ACNP-BC [place] Pager # [phone] OR # [phone] if no answer

## 2017-09-13 NOTE — Progress Notes (Signed)
Patient ID: Steven Forbes, male   DOB: 1925-11-07, 83 y.o.   MRN: [id number]  This NP visited patient at the bedside as a follow up to  [place].  Youngest daughter/Steven Forbes at bedside.   We spoke to the fact that he survived the surgery  (patient was high risk), she verbalizes positivity for continued improvement.   Revisited his multiple co-morbi ties and high mortality.  She and her family understand and will take it one day at at time.  PMT will continue to support holistically and family is encouraged to call with questions or concerns.   Discussed with patient the importance of continued conversation with family and their  medical providers regarding overall plan of care and treatment options,  ensuring decisions are within the context of the patient's best interest and values and GOCs.  Questions and concerns addressed  Time in  0910         Time out     0925  Total time spent on the unit was 15 minutes  Greater than 50% of the time was spent in counseling and coordination of care  Steven Lessen NP  Palliative Medicine Team Team Phone # [phone] Pager [phone]

## 2017-09-13 NOTE — Procedures (Signed)
Extubation Procedure Note  Patient Details:   Name: Steven Forbes DOB: 1925/11/22 MRN: [id number]   Airway Documentation:     Evaluation  O2 sats: stable throughout Complications: No apparent complications Patient did tolerate procedure well. Bilateral Breath Sounds: Clear   Yes   Patient extubated to 2L nasal cannula per MD order.  Positive cuff leak noted.  No evidence of stridor.  Patient able to speak post extubation.  Sats currently 98%.  Vitals are stable.  No complications noted.  Alphia Moh N 09/13/2017, 3:01 PM

## 2017-09-13 NOTE — Progress Notes (Signed)
[facility] Progress Note  Subjective: unable to complete HD last night due to BP's in 70's, maxed on 3 pressors.   Vitals:   09/13/17 1000 09/13/17 1100 09/13/17 1122 09/13/17 1141  BP: 107/61 113/66  (!) 108/40  Pulse: 85 79  81  Resp: 17 15    Temp:   98.2 F (36.8 C)   TempSrc:   Oral   SpO2: 99% 100%  100%  Weight:      Height:        Inpatient medications: . chlorhexidine gluconate (MEDLINE KIT)  15 mL Mouth Rinse BID  . heparin  5,000 Units Subcutaneous Q8H  . hydrocortisone sod succinate (SOLU-CORTEF) inj  50 mg Intravenous Q6H  . insulin aspart  2-6 Units Subcutaneous Q4H  . mouth rinse  15 mL Mouth Rinse QID  . pantoprazole (PROTONIX) IV  40 mg Intravenous Q24H   . sodium chloride    . sodium chloride    . sodium chloride    . dextrose 50 mL/hr at 09/13/17 0600  . diltiazem (CARDIZEM) infusion Stopped (09/12/17 1558)  . norepinephrine (LEVOPHED) Adult infusion 8 mcg/min (09/13/17 0835)  . phenylephrine (NEO-SYNEPHRINE) Adult infusion 190 mcg/min (09/13/17 1156)  . piperacillin-tazobactam (ZOSYN)  IV 3.375 g (09/13/17 1002)  .  sodium bicarbonate  infusion 1000 mL    . vancomycin    . vasopressin (PITRESSIN) infusion - *FOR SHOCK* 0.03 Units/min (09/13/17 0600)   Place/Maintain arterial line **AND** sodium chloride, sodium chloride, sodium chloride, alteplase, fentaNYL (SUBLIMAZE) injection, fentaNYL (SUBLIMAZE) injection, heparin, lidocaine (PF), lidocaine-prilocaine, LORazepam, pentafluoroprop-tetrafluoroeth  Exam: On vent , not responding ETT in place Chest scatt rhonchi, no wheezing RRR tachy no RG Abd nondist, dec'd BS Ext diffuse edema 1-2+  R IJ TDC    Dialysis: [facility] MWF  4h   51.5kg  3K/2.25 bath  Hep 2500   R IJ cath 180NRe 400/auto 1.5   UFP 4 -Mircera 50 mcg IV q 2 weeks (last dose 08/31/17 Last HGB 9.0 09/06/17)        Impression: 1. Sepsis/ gangrenous leg - sp R AKA 12/24 per ortho 2. Shock / hypotension - remains on 3  pressors.  3. ESRD - usual HD is MWF.  Didn't tolerate HD 12/24 due to hypotension. Not CRRT candidate due to severe underlying comorbitidites.  Will follow. 4. Anemia of CKD  - HGB 8.5. Ordered darbe 40 ug SQ since not getting HD today.   5. Metabolic bone disease -  No binders/VDRA. Ca 7 C Ca 8.9 6.  Nutrition - Severe PCM  7. DM: per primary 8. DNR 9. Dementia - at baseline   Plan - still unable to dialyze patient due to severe shock.  Will reassess in am.    Kelly Splinter MD [facility] pager [phone]   09/13/2017, 12:11 PM   Recent Labs  Lab 09/06/2017 0213 08/27/2017 1516 09/12/17 0240 09/13/17 0900  NA 137 134* 132* 132*  K 3.4* 3.6 3.5 4.4  CL 102  --  100* 101  CO2 23  --  22 12*  GLUCOSE 149*  --  107* 104*  BUN 6  --  8 9  CREATININE 1.53*  --  2.20* 2.25*  CALCIUM 7.1*  --  7.0* 6.6*  PHOS 1.6*  --  3.2  --    Recent Labs  Lab 09/01/2017 1802 09/12/17 0240 09/13/17 0900  AST 47*  --  87*  ALT 40  --  33  ALKPHOS 151*  --  129*  BILITOT 0.9  --  1.6*  PROT 4.5*  --  4.4*  ALBUMIN 1.2* 1.5* 1.2*   Recent Labs  Lab 08/27/2017 1802 08/31/2017 0213 08/19/2017 1516 09/12/17 0240 09/13/17 0900  WBC 20.9* 30.8*  --  21.3* 39.5*  NEUTROABS 18.2*  --   --   --   --   HGB 8.5* 9.2* 8.8* 9.1* 8.5*  HCT 26.5* 28.5* 26.0* 27.0* 26.5*  MCV 94.0 93.1  --  88.8 92.7  PLT 174 178  --  230 161   Iron/TIBC/Ferritin/ %Sat    Component Value Date/Time   IRON 31 (L) 05/27/2011 0757   TIBC 170 (L) 05/27/2011 0757   FERRITIN 840 (H) 05/27/2011 0757   IRONPCTSAT 18 (L) 05/27/2011 0757

## 2017-09-13 NOTE — Progress Notes (Signed)
Inpatient Diabetes Program Recommendations  AACE/ADA: New Consensus Statement on Inpatient Glycemic Control (2015)  Target Ranges:  Prepandial:   less than 140 mg/dL      Peak postprandial:   less than 180 mg/dL (1-2 hours)      Critically ill patients:  140 - 180 mg/dL   Lab Results  Component Value Date   GLUCAP 110 (H) 09/13/2017   HGBA1C 6.2 (H) 09/09/2013    Review of Glycemic Control  Inpatient Diabetes Program Recommendations:   Noted episodes of hypoglycemia. -D/C Novolog correction scale and reevaluate  Thank you, Nani Gasser. Jamaurion Slemmer, RN, MSN, CDE  Diabetes Coordinator Inpatient Glycemic Control Team Team Pager [phone] (8am-5pm) 09/13/2017 1:34 PM

## 2017-09-13 NOTE — Op Note (Signed)
NAME:  HAVEN, FOSS NO.:  MEDICAL RECORD NO.:  [id number]  LOCATION:                                 FACILITY:  PHYSICIAN:  Lind Guest. Ninfa Linden, M.D.DATE OF BIRTH:  Oct 05, 1925  DATE OF PROCEDURE:  08/25/2017 DATE OF DISCHARGE:                              OPERATIVE REPORT   PREOPERATIVE DIAGNOSIS:  Gangrenous left foot and ankle with severe peripheral vascular disease, status post transmetatarsal amputation with nail sepsis.  POSTOPERATIVE DIAGNOSIS:  Gangrenous left foot and ankle with severe peripheral vascular disease, status post transmetatarsal amputation with nail sepsis.  PROCEDURE:  Right above-knee amputation.  SURGEON:  Lind Guest. Ninfa Linden, M.D.  ANESTHESIA:  General.  BLOOD LOSS:  50 mL.  TOURNIQUET TIME:  120 minutes.  COMPLICATIONS:  None.  INDICATIONS:  Mr. Nuttall is a sickly 83 year old gentleman, on dialysis, who has multiple medical problems.  Apparently, he had had a transmetatarsal amputation and due to severe peripheral vascular disease, he has been unable to heal this and has developed dry gangrenous leg.  He presented to the emergency room last evening septic. We had a long and thorough discussion with the family about him being so sick that we cannot guarantee an above-knee amputation would help extend his life much other than prolonged suffering.  We had a long discussion about this and even palliative care was called in.  The patient has since been intubated in the unit today and still septic appearing.  The family is convinced that the "poison in his body" can only get out with an above-knee amputation.  They understand that he is gravely ill, that he may likely die on the operating table versus even not ever be extubated and die in unit.  In spite of all this discussion, the family still wish [place] to proceed with surgery.  PROCEDURE DESCRIPTION:  After informed consent was obtained, appropriate right leg  was marked.  He was brought straight back to the operating room from the ICU, intubated and placed on the operating table.  General anesthesia was obtained.  His right leg was prepped and draped from the lower abdominal and groin area all the way down to past the knee with a sterile stockinette as well.  Time-out was called and he was identified as correct patient and correct right leg.  I still used sterile tourniquet around his upper right thigh and had the tourniquet inflated to 300 mm of pressure.  I then made my skin incision distally at the distal thigh and carried this in a fishmouth type format finding edematous tissue throughout the lower leg and obviously muscle did not look great.  I used an oscillating saw to make my femoral cut and passed the leg off the table after I had completed the skin bridge sharply with a #10 blade.  I identified all neurovascular bundles and the sciatic nerve.  I was able to cut the sciatic nerve and have it retracted up into the thigh and I closed off the neurovascular bundles and arteries easily with ligating down with 2-0 silk sutures.  We then let the tourniquet down.  There was no significant blood loss at all.  I then irrigated the soft tissue with normal saline solution and reapproximated the fascia with interrupted 0 Vicryl suture, followed by 2-0 Vicryl in the subcutaneous tissue, interrupted 2-0 nylon on the skin.  Xeroform and well-padded sterile dressing was applied.  He was taken back to the ICU in guarded condition, intubated.  Of note, given the poor quality of his tissue, he has a high risk of not healing this above-knee amputation and may require further surgery.     Lind Guest. Ninfa Linden, M.D.     CYB/MEDQ  D:  08/29/2017  T:  08/20/2017  Job:  [id number]

## 2017-09-13 NOTE — Consult Note (Addendum)
[facility] Nurse wound consult note Reason for Consult: Consult requested for left outer heel wound. Ortho service following for assessment and plan of care to right leg. Wound type: Chronic full thickness wound, according to the family member at the bedside it has declined. They also stated pt does not like to wear heel lift boots and prefers a pillow to offload the affected area. Measurement: .4X.4X.3cm, bone palpable when probed with a swab Wound bed: red and moist Drainage (amount, consistency, odor) mod amt tan drainage, no odor Periwound: intact skin surrounding Dressing procedure/placement/frequency: Aquacel to absorb drainage and provide antimicrobial benefits, foam dressing to protect from further injury, float heel to reduce pressure on pillow.  Discussed plan of care with family member at the bedside. Please re-consult if further assistance is needed.  Thank-you,  Julien Girt MSN, IXL, [place], [place], [place]

## 2017-09-13 NOTE — Progress Notes (Signed)
PULMONARY / CRITICAL CARE MEDICINE   Name: Steven Forbes MRN: [id number] DOB: 06/14/1926    ADMISSION DATE:  08/21/2017 CONSULTATION DATE:  08/31/2017  REFERRING MD:  Dr. Ericka Forbes   CHIEF COMPLAINT:  AMS   HISTORY OF PRESENT ILLNESS:   83 year old Steven with extensive PMH including dementia, DM, ESRD on HD M/W/F, GERD, Anemia, HLD, HTN, Aortic Stenosis, right transmetatarsal amputation, and known gangrenous right leg  Presents to ED from home with progressive lethargy. Per family patient was scheduled January 2 for AKA of right leg. Presented hypotensive, LA 7.49, Forbes 2 L Bolus, Vancomycin/Zosyn and started on Levophed Gtt. PCCM called for admission.    Orthopedics was consulted. Agree with surgery 12/24. However, have discussed with family high risk due to multiple co-morbidites and current state of health.   SUBJECTIVE:  Remains in refractory shock  VITAL SIGNS: BP 107/61   Pulse 85   Temp 98 F (36.7 C) (Oral)   Resp 17   Ht 5\' 5"  (1.651 m)   Wt 117 lb 11.6 oz (53.4 kg)   SpO2 99%   BMI 19.59 kg/m   HEMODYNAMICS:    VENTILATOR SETTINGS: Vent Mode: PSV;CPAP FiO2 (%):  [40 %] 40 % Set Rate:  [16 bmp] 16 bmp Vt Set:  [500 mL] 500 mL PEEP:  [5 cmH20] 5 cmH20 Pressure Support:  [5 cmH20] 5 cmH20 Plateau Pressure:  [16 cmH20] 16 cmH20  INTAKE / OUTPUT:  Intake/Output Summary (Last 24 hours) at 09/13/2017 1107 Last data filed at 09/13/2017 0600 Gross per 24 hour  Intake 3873.42 ml  Output -  Net 3873.42 ml     PHYSICAL EXAMINATION: General: [name].  Currently opens eyes, interactive, does not follow commands, no distress on pressure support ventilation HEENT: Orally intubated no jugular venous distention Pulmonary: Clear to auscultation without accessory use Cardiac: Regular rate and rhythm Abdomen: Soft nontender Extremity: Status post right AKA, left lower extremity is cold to touch, palp palpable posterior popliteal pulse  only. Neuro/psych, opens eyes, moves extremities  LABS:  BMET Recent Labs  Lab 08/26/2017 0213 08/23/2017 1516 09/12/17 0240 09/13/17 0900  NA 137 134* 132* 132*  K 3.4* 3.6 3.5 4.4  CL 102  --  100* 101  CO2 23  --  22 12*  BUN 6  --  8 9  CREATININE 1.53*  --  2.20* 2.25*  GLUCOSE 149*  --  107* 104*    Electrolytes Recent Labs  Lab 09/09/2017 0213 09/12/17 0240 09/13/17 0900  CALCIUM 7.1* 7.0* 6.6*  MG 1.6*  --   --   PHOS 1.6* 3.2  --     CBC Recent Labs  Lab 09/16/2017 0213 09/06/2017 1516 09/12/17 0240 09/13/17 0900  WBC 30.8*  --  21.3* 39.5*  HGB 9.2* 8.8* 9.1* 8.5*  HCT 28.5* 26.0* 27.0* 26.5*  PLT 178  --  230 161    Coag's No results for input(s): APTT, INR in the last 168 hours.  Sepsis Markers Recent Labs  Lab 09/12/2017 1936 08/22/2017 2215 09/14/2017 0213 09/12/17 0241 09/13/17 0500  LATICACIDVEN 7.33* 7.4* 5.0*  --   --   PROCALCITON  --   --   --  4.82 6.07    ABG Recent Labs  Lab 08/22/2017 1846 08/29/2017 1516 09/06/2017 1559  PHART 7.566* 7.518* 7.488*  PCO2ART 25.9* 25.6* 28.6*  PO2ART 133.0* 436.0* 408.0*    Liver Enzymes Recent Labs  Lab 09/01/2017 1802 09/12/17 0240 09/13/17 0900  AST 47*  --  87*  ALT 40  --  33  ALKPHOS 151*  --  129*  BILITOT 0.9  --  1.6*  ALBUMIN 1.2* 1.5* 1.2*    Cardiac Enzymes Recent Labs  Lab 08/31/2017 2215 08/22/2017 0213  TROPONINI 0.38* 0.36*    Glucose Recent Labs  Lab 09/12/17 1953 09/12/17 2000 09/12/17 2333 09/13/17 0403 09/13/17 0720 09/13/17 0726  GLUCAP <10* 97 129* 138* 20* 101*    Imaging No results found.I have reviewed the images personally  STUDIES:   CULTURES: Blood 12/23 >> Neg  ANTIBIOTICS: Vancomycin 12/23 >> Zosyn 12/23 >>   SIGNIFICANT EVENTS: 12/23 > Presents to ED   LINES/TUBES: ETT 12/24 > Lt CVL IJ 12/24 > Lt femoral a line 12/24 >  DISCUSSION: 83 year old Steven with known gangrenous right leg, seen by Dr. Sharol Forbes in office, planned above knee  amputation January 2. Presents to ED septic  Underwent rt leg amputation 12/24; acid-base continues to worsen, he did not tolerate dialysis, remains in refractory shock/multiple organ failure.  He currently is awake I think he could protect his airway and if his minute ventilation is adequate this may be the only time we can extubate him and allow him to interact with his family.  I do not think he is going to survive this hospitalization, certainly he will not if he is no longer a candidate for dialysis.  ASSESSMENT / PLAN:  Ventilator dependence -He was electively intubated prior to surgery, remains ventilated.  Metabolic derangements certainly contributing to it minute ventilation requirements -Portable chest x-ray review: Endotracheal tube in satisfactory position, dialysis catheter in satisfactory position, bibasilar airspace disease noted, appears to be element of atelectasis.  Last chest x-ray obtained on 12/24 -He is awake, has excellent frequency tidal volume ratio, however still on high dose pressors Plan Continuing mechanical ventilation, has what appears to be adequate minute ventilation on pressure support Check ABG on current ventilator settings Forbes high risk of acidosis Will discuss case once again with nephrology, if he is not a candidate for CRRT then we should consider one-way extubation Day #4 vancomycin and Zosyn   Septic Shock in setting of gangrenous right leg status post amputation 12/24 Demand Ischemia Tn1 0.36 Aortic Stenosis (EF on 03/17/17 50-55)  H/O HLD, HTN,  PVD S/P transmetatarsal amputation of right foot   Plan Continuing stress dose steroids Wean pressors for mean arterial pressure greater than 60 Keep euvolemic Checking acid-base   Lactic Acidosis in setting of septic shock  ESRD, HD M/W/F  -Acid base imbalance worse, did not tolerate dialysis yesterday.  Without dialysis he will not survive this illness Plan Add bicarbonate infusion Serial  chemistries We will discuss with nephrology other options, reluctant to initiate CRRT Forbes what appears to be probably a futile situation   GERD Protein Calorie Malnutrition  Plan Continuing tube feeds   Anemia of Chronic disease   Plan Trend CBC Transfuse for hemoglobin less than 7  DM    Plan Line scale insulin   Metabolic Encephalopathy  H/O Dementia  P:   Supportive care  FAMILY: Family updated by CCM team and palliative care. We are maxed out on interventions now. I have discssed with the daughter at bedside. We will continue treatment as it is for now but prognosis is dismal. No CPR if he codes.  DVT prophylaxis: [place] heparin SUP: ppi   Diet: tubefeeds Activity: bedrest Disposition : ICU   Erick Colace ACNP-BC [place]  Pager # [phone] OR # [phone] if no answer

## 2017-09-14 DIAGNOSIS — R4182 Altered mental status, unspecified: Secondary | ICD-10-CM

## 2017-09-14 LAB — GLUCOSE, CAPILLARY
GLUCOSE-CAPILLARY: 133 mg/dL — AB (ref 65–99)
GLUCOSE-CAPILLARY: 79 mg/dL (ref 65–99)
GLUCOSE-CAPILLARY: 142 mg/dL — AB (ref 65–99)
GLUCOSE-CAPILLARY: 89 mg/dL (ref 65–99)
Glucose-Capillary: 20 mg/dL — CL (ref 65–99)
Glucose-Capillary: 110 mg/dL — ABNORMAL HIGH (ref 65–99)
Glucose-Capillary: 126 mg/dL — ABNORMAL HIGH (ref 65–99)
Glucose-Capillary: 47 mg/dL — ABNORMAL LOW (ref 65–99)

## 2017-09-14 LAB — BASIC METABOLIC PANEL
Anion gap: 12 (ref 5–15)
BUN: 12 mg/dL (ref 6–20)
CHLORIDE: 99 mmol/L — AB (ref 101–111)
CO2: 21 mmol/L — AB (ref 22–32)
Calcium: 6.2 mg/dL — CL (ref 8.9–10.3)
Creatinine, Ser: 2.44 mg/dL — ABNORMAL HIGH (ref 0.61–1.24)
GFR calc Af Amer: 25 mL/min — ABNORMAL LOW (ref >60)
GFR calc non Af Amer: 22 mL/min — ABNORMAL LOW (ref >60)
Glucose, Bld: 110 mg/dL — ABNORMAL HIGH (ref 65–99)
POTASSIUM: 3 mmol/L — AB (ref 3.5–5.1)
Sodium: 132 mmol/L — ABNORMAL LOW (ref 135–145)

## 2017-09-14 LAB — CBC
HEMATOCRIT: 23.8 % — AB (ref 39.0–52.0)
Hemoglobin: 7.9 g/dL — ABNORMAL LOW (ref 13.0–17.0)
MCH: 29.8 pg (ref 26.0–34.0)
MCHC: 33.2 g/dL (ref 30.0–36.0)
MCV: 89.8 fL (ref 78.0–100.0)
PLATELETS: 167 10*3/uL (ref 150–400)
RBC: 2.65 MIL/uL — ABNORMAL LOW (ref 4.22–5.81)
RDW: 20 % — ABNORMAL HIGH (ref 11.5–15.5)
WBC: 30.5 10*3/uL — ABNORMAL HIGH (ref 4.0–10.5)

## 2017-09-14 MED ORDER — ALTEPLASE 2 MG IJ SOLR: 2.0000 mg | Freq: Once | INTRAMUSCULAR | Status: DC | PRN

## 2017-09-14 MED ORDER — LIDOCAINE-PRILOCAINE 2.5-2.5 % EX CREA: 1.0000 "application " | TOPICAL_CREAM | CUTANEOUS | Status: DC | PRN

## 2017-09-14 MED ORDER — MIDODRINE HCL 5 MG PO TABS
5.0000 mg | ORAL_TABLET | Freq: Three times a day (TID) | ORAL | Status: DC
  Administered 2017-09-14: 5 mg via ORAL
  Filled 2017-09-14 (×2): qty 1

## 2017-09-14 MED ORDER — LIDOCAINE HCL (PF) 1 % IJ SOLN: 5.0000 mL | INTRAMUSCULAR | Status: DC | PRN

## 2017-09-14 MED ORDER — POTASSIUM CHLORIDE 10 MEQ/50ML IV SOLN
10.0000 meq | INTRAVENOUS | Status: AC
  Administered 2017-09-14 (×3): 10 meq via INTRAVENOUS
  Filled 2017-09-14 (×3): qty 50

## 2017-09-14 MED ORDER — PENTAFLUOROPROP-TETRAFLUOROETH EX AERO: 1.0000 "application " | INHALATION_SPRAY | CUTANEOUS | Status: DC | PRN

## 2017-09-14 MED ORDER — DEXTROSE 50 % IV SOLN
INTRAVENOUS | Status: AC
  Filled 2017-09-14: qty 50

## 2017-09-14 MED ORDER — DEXTROSE 50 % IV SOLN
50.0000 mL | Freq: Once | INTRAVENOUS | Status: AC
  Administered 2017-09-14: 50 mL via INTRAVENOUS
  Filled 2017-09-14: qty 50

## 2017-09-14 MED ORDER — HEPARIN SODIUM (PORCINE) 1000 UNIT/ML DIALYSIS: 1000.0000 [IU] | INTRAMUSCULAR | Status: DC | PRN

## 2017-09-14 MED ORDER — SODIUM CHLORIDE 0.9 % IV SOLN: 100.0000 mL | INTRAVENOUS | Status: DC | PRN

## 2017-09-14 NOTE — Progress Notes (Signed)
[facility] Progress Note  Subjective: extubated, awake, almost off of pressors.    Vitals:   09/14/17 1100 09/14/17 1121 09/14/17 1200 09/14/17 1300  BP: (!) 116/59  (!) 106/52 99/63  Pulse: 96   92  Resp: _0 Temp:  (!) 96.9 F (36.1 C)    TempSrc:  Axillary    SpO2: 100%  100% 100%  Weight:      Height:        Inpatient medications: . chlorhexidine gluconate (MEDLINE KIT)  15 mL Mouth Rinse BID  . heparin  5,000 Units Subcutaneous Q8H  . hydrocortisone sod succinate (SOLU-CORTEF) inj  50 mg Intravenous Q6H  . insulin aspart  2-6 Units Subcutaneous Q4H  . mouth rinse  15 mL Mouth Rinse QID  . pantoprazole (PROTONIX) IV  40 mg Intravenous Q24H   . sodium chloride 10 mL/hr at 09/14/17 1300  . norepinephrine (LEVOPHED) Adult infusion 4 mcg/min (09/14/17 1300)  . piperacillin-tazobactam (ZOSYN)  IV Stopped (09/14/17 1330)  . potassium chloride 10 mEq (09/14/17 1347)  . vancomycin     Place/Maintain arterial line **AND** sodium chloride, alteplase, fentaNYL (SUBLIMAZE) injection, heparin, ipratropium-albuterol, lidocaine (PF), lidocaine-prilocaine, pentafluoroprop-tetrafluoroeth  Exam: Lying in 30 deg, no distress NO jvd Chest mostly clear RRR tachy no RG Abd nondist, dec'd BS Ext diffuse LE/ UE edema R IJ TDC    Dialysis: [facility] MWF  4h   51.5kg  3K/2.25 bath  Hep 2500   R IJ cath 180NRe 400/auto 1.5   UFP 4 -Mircera 50 mcg IV q 2 weeks (last dose 08/31/17 Last HGB 9.0 09/06/17)      Impression: 1. Sepsis/ gangrenous leg - sp R AKA 12/24 per ortho 2. Shock / hypotension - resolved 3. ESRD - usual HD is MWF.  Not CRRT candidate.  Doing better. Will plan short HD tonight and regular HD tomorrow, start to get some of the excess fluid off.  4. Vol overload - sig edema, last CXR clear 12/24. Have dc'd bicarb gtt, doesn't need this right now.  5. Anemia of CKD  - HGB 8.5. Give darbe 40 ug tonight with HD.  6. Metabolic bone disease -  No  binders/VDRA. Ca 7 C Ca 8.9 7. DM: per primary 8. DNR 9. Dementia - at baseline   Plan - as above   Kelly Splinter MD [place] pager [phone]   09/14/2017, 1:52 PM   Recent Labs  Lab 09/03/2017 0213  09/12/17 0240 09/13/17 0900 09/14/17 0326  NA 137   < > 132* 132* 132*  K 3.4*   < > 3.5 4.4 3.0*  CL 102  --  100* 101 99*  CO2 23  --  22 12* 21*  GLUCOSE 149*  --  107* 104* 110*  BUN 6  --  _1 CREATININE 1.53*  --  2.20* 2.25* 2.44*  CALCIUM 7.1*  --  7.0* 6.6* 6.2*  PHOS 1.6*  --  3.2  --   --    < > = values in this interval not displayed.   Recent Labs  Lab 09/14/2017 1802 09/12/17 0240 09/13/17 0900  AST 47*  --  87*  ALT 40  --  33  ALKPHOS 151*  --  129*  BILITOT 0.9  --  1.6*  PROT 4.5*  --  4.4*  ALBUMIN 1.2* 1.5* 1.2*   Recent Labs  Lab 08/31/2017 1802  09/12/17 0240 09/13/17 0900 09/14/17 0326  WBC 20.9*   < >  21.3* 39.5* 30.5*  NEUTROABS 18.2*  --   --   --   --   HGB 8.5*   < > 9.1* 8.5* 7.9*  HCT 26.5*   < > 27.0* 26.5* 23.8*  MCV 94.0   < > 88.8 92.7 89.8  PLT 174   < > 230 161 167   < > = values in this interval not displayed.   Iron/TIBC/Ferritin/ %Sat    Component Value Date/Time   IRON 31 (L) 05/27/2011 0757   TIBC 170 (L) 05/27/2011 0757   FERRITIN 840 (H) 05/27/2011 0757   IRONPCTSAT 18 (L) 05/27/2011 5198

## 2017-09-14 NOTE — Progress Notes (Signed)
Patient ID: Steven Forbes, male   DOB: 1926/05/18, 83 y.o.   MRN: [id number] New right AKA stump dressing clean and intact.  Had to be changed earlier by nursing secondary to getting soiled.  He is extubated and they may try dialysis tomorrow pending his health status.  According to nursing, the AKA incision looked ok.  Continue current dressing.

## 2017-09-14 NOTE — Progress Notes (Signed)
Pt was almost half asleep when I entered his room. Patient able to open up his eyes but not able to speak. Family present and appreciative of chaplains visit. Chaplain provided emotional support, compassionate presence and prayer.  Marlette Curvin a [place], Chaplain   09/14/17 1500  Clinical Encounter Type  Visited With Patient and family together  Visit Type Follow-up  Referral From Chaplain  Spiritual Encounters  Spiritual Needs Prayer;Ritual  Stress Factors  Patient Stress Factors Health changes  Family Stress Factors Exhausted

## 2017-09-14 NOTE — Progress Notes (Signed)
CRITICAL VALUE ALERT  Critical Value:  CBG 47  Date & Time Notied:  09/14/2017  2000  Provider Notified: Dr Wonda Amis  Orders Received/Actions taken: Dextrose 50% 50 ml given with repeat CBG 147

## 2017-09-14 NOTE — Progress Notes (Signed)
PULMONARY / CRITICAL CARE MEDICINE   Name: Steven Forbes MRN: [id number] DOB: 08/10/1926    ADMISSION DATE:  09/04/2017 CONSULTATION DATE:  09/13/2017  REFERRING MD:  Dr. Ericka Pontiff   CHIEF COMPLAINT:  AMS   HISTORY OF PRESENT ILLNESS:   83 year old male with extensive PMH including dementia, DM, ESRD on HD M/W/F, GERD, Anemia, HLD, HTN, Aortic Stenosis, right transmetatarsal amputation, and known gangrenous right leg  Presents to ED from home with progressive lethargy. Per family patient was scheduled January 2 for AKA of right leg. Presented hypotensive, LA 7.49, given 2 L Bolus, Vancomycin/Zosyn and started on Levophed Gtt. PCCM called for admission.    Orthopedics was consulted. Agree with surgery 12/24. However, have discussed with family high risk due to multiple co-morbidites and current state of health.   SUBJECTIVE:  Weaning pressors  VITAL SIGNS: BP (!) 116/59   Pulse 96   Temp (!) 96.9 F (36.1 C) (Axillary)   Resp 14   Ht 5\' 5"  (1.651 m)   Wt 127 lb 6.8 oz (57.8 kg)   SpO2 100%   BMI 21.20 kg/m   HEMODYNAMICS: CVP:  [2 mmHg-3 mmHg] 3 mmHg  VENTILATOR SETTINGS:    INTAKE / OUTPUT:  Intake/Output Summary (Last 24 hours) at 09/14/2017 1203 Last data filed at 09/14/2017 1100 Gross per 24 hour  Intake 3620.72 ml  Output 0 ml  Net 3620.72 ml     PHYSICAL EXAMINATION: General: 83 year old male patient currently resting comfortably in bed interactive and comfortable.  HEENT: Edentulous, normocephalic otherwise, no jugular venous distention mucous membranes are moist Pulmonary: Clear to auscultation decreased bases Cardiac: Regular rate and rhythm without murmur rub or gallop Abdomen: Soft nontender Extremities: Right AKA unremarkable, warm and dry otherwise Has diffuse anasarca Neuro/psych: Awake, interactive, moves extremities, confused at baseline. LABS:  BMET Recent Labs  Lab 09/12/17 0240 09/13/17 0900 09/14/17 0326  NA 132* 132* 132*  K 3.5 4.4  3.0*  CL 100* 101 99*  CO2 22 12* 21*  BUN 8 9 12   CREATININE 2.20* 2.25* 2.44*  GLUCOSE 107* 104* 110*    Electrolytes Recent Labs  Lab 08/29/2017 0213 09/12/17 0240 09/13/17 0900 09/14/17 0326  CALCIUM 7.1* 7.0* 6.6* 6.2*  MG 1.6*  --   --   --   PHOS 1.6* 3.2  --   --     CBC Recent Labs  Lab 09/12/17 0240 09/13/17 0900 09/14/17 0326  WBC 21.3* 39.5* 30.5*  HGB 9.1* 8.5* 7.9*  HCT 27.0* 26.5* 23.8*  PLT 230 161 167    Coag's No results for input(s): APTT, INR in the last 168 hours.  Sepsis Markers Recent Labs  Lab 09/13/2017 1936 09/01/2017 2215 09/02/2017 0213 09/12/17 0241 09/13/17 0500  LATICACIDVEN 7.33* 7.4* 5.0*  --   --   PROCALCITON  --   --   --  4.82 6.07    ABG Recent Labs  Lab 09/17/2017 1516 09/15/2017 1559 09/13/17 1138  PHART 7.518* 7.488* 7.384  PCO2ART 25.6* 28.6* 21.3*  PO2ART 436.0* 408.0* 86.5    Liver Enzymes Recent Labs  Lab 08/20/2017 1802 09/12/17 0240 09/13/17 0900  AST 47*  --  87*  ALT 40  --  33  ALKPHOS 151*  --  129*  BILITOT 0.9  --  1.6*  ALBUMIN 1.2* 1.5* 1.2*    Cardiac Enzymes Recent Labs  Lab 09/09/2017 2215 08/21/2017 0213  TROPONINI 0.38* 0.36*    Glucose Recent Labs  Lab 09/13/17 1642 09/13/17 2001  09/13/17 2345 09/14/17 0329 09/14/17 0745 09/14/17 1120  GLUCAP 154* 148* 99 110* 133* 126*    Imaging No results found.I have reviewed the images personally  STUDIES:   CULTURES: Blood 12/23 >> Neg  ANTIBIOTICS: Vancomycin 12/23 >> Zosyn 12/23 >>   SIGNIFICANT EVENTS: 12/23 > Presents to ED   LINES/TUBES: ETT 12/24 > Lt CVL IJ 12/24 > Lt femoral a line 12/24 >  Resolved medical issues: Ventilator dependence   DISCUSSION: 83 year old male with known gangrenous right leg, seen by Dr. Sharol Given in office, planned above knee amputation January 2. Presents to ED septic  Underwent rt leg amputation 12/24; acid-base continues to worsen, he did not tolerate dialysis, remains in refractory  shock/multiple organ failure.  He currently is awake I think he could protect his airway and if his minute ventilation is adequate this may be the only time we can extubate him and allow him to interact with his family.  I do not think he is going to survive this hospitalization, certainly he will not if he is no longer a candidate for dialysis.  ASSESSMENT / PLAN:   Septic Shock in setting of gangrenous right leg status post amputation 12/24 Demand Ischemia Tn1 0.36 Aortic Stenosis (EF on 03/17/17 50-55)  H/O HLD, HTN,  PVD S/P transmetatarsal amputation of right foot   Plan Weaning norepinephrine for systolic blood pressure greater than 90 Continue stress dose steroids, will begin to taper dosing once off pressors Continue telemetry monitoring Day #5 Zosyn and vancomycin.  culture data is all negative Because of this we will discontinue antibiotics at day #7   lactic Acidosis in setting of septic shock  ESRD, HD M/W/F  -Acid base imbalance worse, did not tolerate dialysis yesterday.  Without dialysis he will not survive this illness Acid-base improved with bicarbonate infusion Plan Decrease bicarbonate infusion rate Daily renal profiles Will discuss with nephrology, he may yet be able to tolerate traditional dialysis.  No absolute indication today, could consider doing this tomorrow on 12/28.  If he does not tolerate second attempt at traditional dialysis I think we can take further attempts at dialysis off the table.  GERD Protein Calorie Malnutrition  Plan N.p.o. for now, allow clears Evaluate daily to consider advancing diet   Anemia of Chronic disease   -Hemoglobin drifting down however suspect this is somewhat hemo-dilutional Plan Trend CBC Transfuse as indicated  DM    Plan Sliding scale insulin   Metabolic Encephalopathy  H/O Dementia  P:   Supportive care As needed analgesia for surgical pain management  FAMILY: Family updated by CCM team and palliative care.  We are maxed out on interventions now. I have discssed with the daughter at bedside. We will continue treatment as it is for now but prognosis is dismal. No CPR if he codes.  DVT prophylaxis: [place] heparin SUP: ppi   Diet: NPO, allow clears  Activity: bedrest Disposition : ICU   Erick Colace ACNP-BC [place] Pager # [phone] OR # [phone] if no answer

## 2017-09-15 DIAGNOSIS — J9601 Acute respiratory failure with hypoxia: Secondary | ICD-10-CM

## 2017-09-15 DIAGNOSIS — R627 Adult failure to thrive: Secondary | ICD-10-CM

## 2017-09-15 DIAGNOSIS — I959 Hypotension, unspecified: Secondary | ICD-10-CM

## 2017-09-15 DIAGNOSIS — R0609 Other forms of dyspnea: Secondary | ICD-10-CM

## 2017-09-15 DIAGNOSIS — R52 Pain, unspecified: Secondary | ICD-10-CM

## 2017-09-15 LAB — RENAL FUNCTION PANEL
ALBUMIN: 1 g/dL — AB (ref 3.5–5.0)
Albumin: 1 g/dL — ABNORMAL LOW (ref 3.5–5.0)
Anion gap: 14 (ref 5–15)
Anion gap: 15 (ref 5–15)
BUN: 14 mg/dL (ref 6–20)
BUN: 16 mg/dL (ref 6–20)
CHLORIDE: 94 mmol/L — AB (ref 101–111)
CO2: 23 mmol/L (ref 22–32)
CO2: 24 mmol/L (ref 22–32)
Calcium: 6.5 mg/dL — ABNORMAL LOW (ref 8.9–10.3)
Calcium: 6.2 mg/dL — CL (ref 8.9–10.3)
Chloride: 94 mmol/L — ABNORMAL LOW (ref 101–111)
Creatinine, Ser: 2.79 mg/dL — ABNORMAL HIGH (ref 0.61–1.24)
Creatinine, Ser: 2.89 mg/dL — ABNORMAL HIGH (ref 0.61–1.24)
GFR calc Af Amer: 22 mL/min — ABNORMAL LOW (ref >60)
GFR calc Af Amer: 21 mL/min — ABNORMAL LOW (ref >60)
GFR calc non Af Amer: 18 mL/min — ABNORMAL LOW (ref >60)
GFR, EST NON AFRICAN AMERICAN: 19 mL/min — AB (ref >60)
GLUCOSE: 109 mg/dL — AB (ref 65–99)
GLUCOSE: 87 mg/dL (ref 65–99)
PHOSPHORUS: 3.8 mg/dL (ref 2.5–4.6)
POTASSIUM: 3 mmol/L — AB (ref 3.5–5.1)
POTASSIUM: 3 mmol/L — AB (ref 3.5–5.1)
Phosphorus: 3.6 mg/dL (ref 2.5–4.6)
Sodium: 132 mmol/L — ABNORMAL LOW (ref 135–145)
Sodium: 132 mmol/L — ABNORMAL LOW (ref 135–145)

## 2017-09-15 LAB — GLUCOSE, CAPILLARY
GLUCOSE-CAPILLARY: 22 mg/dL — AB (ref 65–99)
GLUCOSE-CAPILLARY: 16 mg/dL — AB (ref 65–99)
GLUCOSE-CAPILLARY: 62 mg/dL — AB (ref 65–99)
GLUCOSE-CAPILLARY: 115 mg/dL — AB (ref 65–99)
GLUCOSE-CAPILLARY: 105 mg/dL — AB (ref 65–99)
GLUCOSE-CAPILLARY: 136 mg/dL — AB (ref 65–99)
GLUCOSE-CAPILLARY: 146 mg/dL — AB (ref 65–99)
Glucose-Capillary: 258 mg/dL — ABNORMAL HIGH (ref 65–99)

## 2017-09-15 LAB — CBC
HEMATOCRIT: 23.5 % — AB (ref 39.0–52.0)
HEMATOCRIT: 23.9 % — AB (ref 39.0–52.0)
Hemoglobin: 7.9 g/dL — ABNORMAL LOW (ref 13.0–17.0)
Hemoglobin: 8.1 g/dL — ABNORMAL LOW (ref 13.0–17.0)
MCH: 29.7 pg (ref 26.0–34.0)
MCH: 30.1 pg (ref 26.0–34.0)
MCHC: 33.6 g/dL (ref 30.0–36.0)
MCHC: 33.9 g/dL (ref 30.0–36.0)
MCV: 88.3 fL (ref 78.0–100.0)
MCV: 88.8 fL (ref 78.0–100.0)
Platelets: 129 10*3/uL — ABNORMAL LOW (ref 150–400)
Platelets: 130 10*3/uL — ABNORMAL LOW (ref 150–400)
RBC: 2.66 MIL/uL — ABNORMAL LOW (ref 4.22–5.81)
RBC: 2.69 MIL/uL — ABNORMAL LOW (ref 4.22–5.81)
RDW: 20.1 % — AB (ref 11.5–15.5)
RDW: 20.1 % — AB (ref 11.5–15.5)
WBC: 29.2 10*3/uL — ABNORMAL HIGH (ref 4.0–10.5)
WBC: 29.4 10*3/uL — ABNORMAL HIGH (ref 4.0–10.5)

## 2017-09-15 LAB — BPAM RBC
BLOOD PRODUCT EXPIRATION DATE: 201901212359
Blood Product Expiration Date: 201901212359
ISSUE DATE / TIME: 201812210940
UNIT TYPE AND RH: 6200
Unit Type and Rh: 6200

## 2017-09-15 LAB — TYPE AND SCREEN
ABO/RH(D): A POS
ANTIBODY SCREEN: NEGATIVE
UNIT DIVISION: 0
UNIT DIVISION: 0

## 2017-09-15 LAB — CULTURE, BLOOD (ROUTINE X 2)
Culture: NO GROWTH
Culture: NO GROWTH
Special Requests: ADEQUATE

## 2017-09-15 MED ORDER — PENTAFLUOROPROP-TETRAFLUOROETH EX AERO: 1.0000 "application " | INHALATION_SPRAY | CUTANEOUS | Status: DC | PRN

## 2017-09-15 MED ORDER — SODIUM CHLORIDE 0.9 % IV SOLN: 100.0000 mL | INTRAVENOUS | Status: DC | PRN

## 2017-09-15 MED ORDER — DEXTROSE 50 % IV SOLN
INTRAVENOUS | Status: AC
  Filled 2017-09-15: qty 50

## 2017-09-15 MED ORDER — ALTEPLASE 2 MG IJ SOLR
2.0000 mg | Freq: Once | INTRAMUSCULAR | Status: DC | PRN
  Filled 2017-09-15: qty 2

## 2017-09-15 MED ORDER — DEXTROSE 50 % IV SOLN
25.0000 g | Freq: Once | INTRAVENOUS | Status: AC
Start: 2017-09-15 — End: 2017-09-15
  Administered 2017-09-15: 25 g via INTRAVENOUS

## 2017-09-15 MED ORDER — HEPARIN SODIUM (PORCINE) 1000 UNIT/ML DIALYSIS: 2500.0000 [IU] | Freq: Once | INTRAMUSCULAR | Status: DC

## 2017-09-15 MED ORDER — DEXTROSE 5 % IV SOLN
INTRAVENOUS | Status: DC
  Administered 2017-09-15: 1000 mL via INTRAVENOUS
  Administered 2017-09-16: 20:00:00 via INTRAVENOUS

## 2017-09-15 MED ORDER — MIDODRINE HCL 5 MG PO TABS
10.0000 mg | ORAL_TABLET | Freq: Three times a day (TID) | ORAL | Status: DC
  Filled 2017-09-15: qty 2

## 2017-09-15 MED ORDER — ALBUMIN HUMAN 25 % IV SOLN
INTRAVENOUS | Status: AC
  Administered 2017-09-15: 25 g via INTRAVENOUS
  Filled 2017-09-15: qty 100

## 2017-09-15 MED ORDER — MIDODRINE HCL 5 MG PO TABS
10.0000 mg | ORAL_TABLET | Freq: Three times a day (TID) | ORAL | Status: DC
  Administered 2017-09-15 – 2017-09-17 (×8): 10 mg via ORAL
  Filled 2017-09-15 (×10): qty 2

## 2017-09-15 MED ORDER — DARBEPOETIN ALFA 100 MCG/0.5ML IJ SOSY
100.0000 ug | PREFILLED_SYRINGE | Freq: Once | INTRAMUSCULAR | Status: AC
  Administered 2017-09-16: 100 ug via SUBCUTANEOUS
  Filled 2017-09-15: qty 0.5

## 2017-09-15 MED ORDER — ALBUMIN HUMAN 25 % IV SOLN
25.0000 g | Freq: Once | INTRAVENOUS | Status: AC
  Administered 2017-09-15: 25 g via INTRAVENOUS

## 2017-09-15 MED ORDER — LIDOCAINE-PRILOCAINE 2.5-2.5 % EX CREA
1.0000 "application " | TOPICAL_CREAM | CUTANEOUS | Status: DC | PRN
  Filled 2017-09-15: qty 5

## 2017-09-15 MED ORDER — HEPARIN SODIUM (PORCINE) 1000 UNIT/ML DIALYSIS: 1000.0000 [IU] | INTRAMUSCULAR | Status: DC | PRN

## 2017-09-15 MED ORDER — LIDOCAINE HCL (PF) 1 % IJ SOLN: 5.0000 mL | INTRAMUSCULAR | Status: DC | PRN

## 2017-09-15 NOTE — Progress Notes (Addendum)
Pharmacy Antibiotic Note  Steven Forbes is a 83 y.o. male admitted on 09/08/2017 with sepsis.  Pharmacy has been consulted for Vancomycin/Zosyn dosing. ESRD on HD MWF. S/p AKA 12/24. Last HD on Tuesday 12/25. Patient receiving HD session in his room right now.  WBC down to 29.2 and patient afebrile.   Plan: Vancomycin 500 mg X 1 post-HD today- Stop date in  Zosyn 3.375 gm every 12 hours - Stop date in Monitor clinical s/sx of infection and HD sessions    Height: 5\' 5"  (165.1 cm) Weight: 136 lb 7.4 oz (61.9 kg) IBW/kg (Calculated) : 61.5  Temp (24hrs), Avg:97.2 F (36.2 C), Min:95.8 F (35.4 C), Max:98.3 F (36.8 C)  Recent Labs  Lab 09/03/2017 1811 08/20/2017 1936 08/23/2017 2215 09/03/2017 0213 09/12/17 0240 09/13/17 0900 09/14/17 0010 09/14/17 0011 09/14/17 0326 09/15/17 0930  WBC  --   --   --  30.8* 21.3* 39.5* 29.4*  --  30.5* 29.2*  CREATININE  --   --   --  1.53* 2.20* 2.25*  --  2.79* 2.44* 2.89*  LATICACIDVEN 7.49* 7.33* 7.4* 5.0*  --   --   --   --   --   --     Estimated Creatinine Clearance: 15.1 mL/min (A) (by C-G formula based on SCr of 2.89 mg/dL (H)).    No Known Allergies   Vanc 12/23>>(12/30) Zosyn 12/23>>(12/30)  12/23 Blood - NGTD 12/23 MRSA - [place], PharmD, BCPS PGY2 Infectious Diseases Pharmacy Resident Phone: [phone]  09/15/2017 11:53 AM

## 2017-09-15 NOTE — Progress Notes (Signed)
[facility] Progress Note  Subjective: back on pressors , levo at 2 ug/min.  Wt's 62kg.    Vitals:   09/15/17 1300 09/15/17 1315 09/15/17 1330 09/15/17 1333  BP: 92/77 (!) 88/64 98/65 (!) 90/52  Pulse: 100 100 100 87  Resp: _0 Temp:   97.6 F (36.4 C)   TempSrc:      SpO2: 91% 100% 100% 91%  Weight:   59.9 kg (132 lb 0.9 oz)   Height:        Inpatient medications: . chlorhexidine gluconate (MEDLINE KIT)  15 mL Mouth Rinse BID  . [START ON 09/16/2017] heparin  2,500 Units Dialysis Once in dialysis  . heparin  5,000 Units Subcutaneous Q8H  . hydrocortisone sod succinate (SOLU-CORTEF) inj  50 mg Intravenous Q6H  . insulin aspart  2-6 Units Subcutaneous Q4H  . mouth rinse  15 mL Mouth Rinse QID  . midodrine  10 mg Oral TID WC  . pantoprazole (PROTONIX) IV  40 mg Intravenous Q24H   . sodium chloride 10 mL/hr at 09/15/17 0400  . sodium chloride    . sodium chloride    . sodium chloride    . sodium chloride    . dextrose 50 mL/hr at 09/15/17 1200  . norepinephrine (LEVOPHED) Adult infusion 20 mcg/min (09/15/17 1220)  . piperacillin-tazobactam (ZOSYN)  IV 3.375 g (09/15/17 0946)  . vancomycin Stopped (09/15/17 1332)   Place/Maintain arterial line **AND** sodium chloride, sodium chloride, sodium chloride, sodium chloride, sodium chloride, alteplase, alteplase, fentaNYL (SUBLIMAZE) injection, heparin, heparin, ipratropium-albuterol, lidocaine (PF), lidocaine (PF), lidocaine-prilocaine, lidocaine-prilocaine, pentafluoroprop-tetrafluoroeth, pentafluoroprop-tetrafluoroeth  Exam: Lying in 30 deg, no distress, talking but confused NO jvd Chest bilat rales 1/2 up post RRR tachy no RG Abd nondist, dec'd BS Ext diffuse 2-3+ LE/ UE edema R IJ TDC    Dialysis: [facility] MWF  4h   51.5kg  3K/2.25 bath  Hep 2500   R IJ cath 180NRe 400/auto 1.5   UFP 4 -Mircera 50 mcg IV q 2 weeks (last dose 08/31/17 Last HGB 9.0 09/06/17)      Impression: 1. Sepsis/ gangrenous  leg - sp R AKA 12/24 per ortho 2. Shock / hypotension - resolving 3. ESRD - usual HD is MWF.  Not CRRT candidate. Plan reg HD today and if tolerates well another HD tomorrow.  4. Volume - marked vol overload, please minimize all IV and po fluids  5. Anemia of CKD  - HGB 8- 9 range. Give darbe SQ   6. Metabolic bone disease -  No binders/VDRA. Ca 7 C Ca 8.9 7. DM: per primary 8. DNR 9. Dementia - at baseline   Plan - as above   Kelly Splinter MD [place] pager [phone]   09/15/2017, 1:47 PM   Recent Labs  Lab 09/12/17 0240  09/14/17 0011 09/14/17 0326 09/15/17 0930  NA 132*   < > 132* 132* 132*  K 3.5   < > 3.0* 3.0* 3.0*  CL 100*   < > 94* 99* 94*  CO2 22   < > 24 21* 23  GLUCOSE 107*   < > 87 110* 109*  BUN 8   < > _1 CREATININE 2.20*   < > 2.79* 2.44* 2.89*  CALCIUM 7.0*   < > 6.5* 6.2* 6.2*  PHOS 3.2  --  3.6  --  3.8   < > = values in this interval not displayed.   Recent Labs  Lab  08/31/2017 1802  09/13/17 0900 09/14/17 0011 09/15/17 0930  AST 47*  --  87*  --   --   ALT 40  --  33  --   --   ALKPHOS 151*  --  129*  --   --   BILITOT 0.9  --  1.6*  --   --   PROT 4.5*  --  4.4*  --   --   ALBUMIN 1.2*   < > 1.2* 1.0* 1.0*   < > = values in this interval not displayed.   Recent Labs  Lab 09/16/2017 1802  09/14/17 0010 09/14/17 0326 09/15/17 0930  WBC 20.9*   < > 29.4* 30.5* 29.2*  NEUTROABS 18.2*  --   --   --   --   HGB 8.5*   < > 8.1* 7.9* 7.9*  HCT 26.5*   < > 23.9* 23.8* 23.5*  MCV 94.0   < > 88.8 89.8 88.3  PLT 174   < > 129* 167 130*   < > = values in this interval not displayed.   Iron/TIBC/Ferritin/ %Sat    Component Value Date/Time   IRON 31 (L) 05/27/2011 0757   TIBC 170 (L) 05/27/2011 0757   FERRITIN 840 (H) 05/27/2011 0757   IRONPCTSAT 18 (L) 05/27/2011 5038

## 2017-09-15 NOTE — Progress Notes (Signed)
Nutrition Follow-up  DOCUMENTATION CODES:   Non-severe (moderate) malnutrition in context of chronic illness  INTERVENTION:   -Magic Cup TID with meals  NUTRITION DIAGNOSIS:   Moderate Malnutrition related to chronic illness(dementia) as evidenced by moderate fat depletion, mild muscle depletion, moderate muscle depletion.  Ongoing  GOAL:   Patient will meet greater than or equal to 90% of their needs  Progressing  MONITOR:   Diet advancement, PO intake, Supplement acceptance, Skin, Labs  REASON FOR ASSESSMENT:   Malnutrition Screening Tool    ASSESSMENT:   83 yo male with PMH of HLD, anemia, HTN, ESRD-HD, NSTEMI, asthma, SOB, HOH, DM, dementia, GERD who was admitted on 12/23 with sepsis due to gangrenous RLE. Plans for R AKA on 12/24.  Case discussed with RN. Pt remains with NGT for medications. Pt remains a high aspiration risk, but pt family willing to accept risk to allow pt to eat. SLP saw pt just prior to RD visit. Diet has been advanced to dysphagia 1 diet with thin liquids. Plan to perform MBS tomorrow (09/16/17) for recommendations for safest diet for comfort.   Palliative care service following for ongoing goals of care discussions.   Labs reviewed.   Diet Order:  Diet clear liquid Room service appropriate? Yes; Fluid consistency: Thin  EDUCATION NEEDS:   No education needs have been identified at this time  Skin:  Skin Assessment: Skin Integrity Issues: Skin Integrity Issues:: Diabetic Ulcer Diabetic Ulcer: R heel  Last BM:  09/15/17 (0 ml via rectal tube)  Height:   Ht Readings from Last 1 Encounters:  09/15/17 5\' 5"  (1.651 m)    Weight:   Wt Readings from Last 1 Encounters:  09/15/17 132 lb 0.9 oz (59.9 kg)    Ideal Body Weight:  61.8 kg  BMI:  Body mass index is 21.98 kg/m.  Estimated Nutritional Needs:   Kcal:  1600-1800  Protein:  85-95 gm  Fluid:  1.6-1.8 L    Loralei Radcliffe A. Jimmye Norman, RD, LDN, CDE Pager: [phone] After  hours Pager: [phone]

## 2017-09-15 NOTE — Progress Notes (Signed)
Arrived to patient room 72M-09 at 0945.  Reviewed treatment plan and this RN agrees with plan.  Report received from bedside RN, Barbaraann Rondo.  Consent verified.  Patient Alert, D/O X 4.   Lung sounds coarse to ausculation in all fields. Generalized 3+ pitting edema. Cardiac:  NSR with frequent ectopy.  Removed caps and cleansed RIJ catheter with chlorhedxidine.  Aspirated ports of heparin and flushed them with saline per protocol.  Connected and secured lines, initiated treatment at 1000.  UF Goal of 3500 mL and net fluid removal 3 L.  Albumin given at start of treatment, 25g of 25%.  Will continue to monitor.

## 2017-09-15 NOTE — Progress Notes (Signed)
[place] Progress Note Patient Name: Steven Forbes DOB: 1926/05/04 MRN: [id number]   Date of Service  09/15/2017  HPI/Events of Note  Hypotension, hypoglycemia  eICU Interventions  Restart norepi, increase midodrine to 10 mg tid Start D5W at 50cc/hr     Intervention Category Major Interventions: Hypotension - evaluation and management  Caila Cirelli 09/15/2017, 4:07 AM

## 2017-09-15 NOTE — Progress Notes (Signed)
Dialysis treatment completed.  2500 mL ultrafiltrated.  2000 mL net fluid removal.  Patient status unchanged. Lung sounds diminished to ausculation in all fields. Generalized 2+ pitting edema. Cardiac: NSR to ST.  Cleansed RIJ catheter with chlorhexidine.  Disconnected lines and flushed ports with saline per protocol.  Ports locked with heparin and capped per protocol.    Report given to bedside, RN Keely.

## 2017-09-15 NOTE — Progress Notes (Signed)
PULMONARY / CRITICAL CARE MEDICINE   Name: Steven Forbes MRN: [id number] DOB: [birth year]    ADMISSION DATE:  09/07/2017 CONSULTATION DATE:  08/28/2017  REFERRING MD:  Dr. Ericka Pontiff   CHIEF COMPLAINT:  AMS   HISTORY OF PRESENT ILLNESS:   83 year old male with extensive PMH including dementia, DM, ESRD on HD M/W/F, GERD, Anemia, HLD, HTN, Aortic Stenosis, right transmetatarsal amputation, and known gangrenous right leg  Presents to ED from home with progressive lethargy. Per family patient was scheduled January 2 for AKA of right leg. Presented hypotensive, LA 7.49, given 2 L Bolus, Vancomycin/Zosyn and started on Levophed Gtt. PCCM called for admission.    Orthopedics was consulted. Agree with surgery 12/24. However, have discussed with family high risk due to multiple co-morbidites and current state of health.   SUBJECTIVE:  Had to restart pressors overnight due to persistent hypotension  VITAL SIGNS: BP (!) 102/49   Pulse 93   Temp 97.8 F (36.6 C) (Oral)   Resp 17   Ht 5\' 5"  (1.651 m)   Wt 61.9 kg (136 lb 7.4 oz)   SpO2 97%   BMI 22.71 kg/m   HEMODYNAMICS:    VENTILATOR SETTINGS:    INTAKE / OUTPUT:  Intake/Output Summary (Last 24 hours) at 09/15/2017 1007 Last data filed at 09/15/2017 1000 Gross per 24 hour  Intake 1267.17 ml  Output 0 ml  Net 1267.17 ml   PHYSICAL EXAMINATION: General: Chronically ill appearing male that is hypotensive and back on pressors Pulmonary: CTA bilaterally Cardiac: RRR, Nl S1/S2 and -M/R/G Abdomen: Soft, NT, ND and +BS Extremities: Right AKA unremarkable, warm and dry otherwise Has diffuse anasarca Neuro/psych: Awake, interactive, moves extremities, confused at baseline.  LABS:  BMET Recent Labs  Lab 09/13/17 0900 09/14/17 0011 09/14/17 0326  NA 132* 132* 132*  K 4.4 3.0* 3.0*  CL 101 94* 99*  CO2 12* 24 21*  BUN 9 14 12   CREATININE 2.25* 2.79* 2.44*  GLUCOSE 104* 87 110*   Electrolytes Recent Labs  Lab  09/16/2017 0213 09/12/17 0240 09/13/17 0900 09/14/17 0011 09/14/17 0326  CALCIUM 7.1* 7.0* 6.6* 6.5* 6.2*  MG 1.6*  --   --   --   --   PHOS 1.6* 3.2  --  3.6  --    CBC Recent Labs  Lab 09/13/17 0900 09/14/17 0010 09/14/17 0326  WBC 39.5* 29.4* 30.5*  HGB 8.5* 8.1* 7.9*  HCT 26.5* 23.9* 23.8*  PLT 161 129* 167   Coag's No results for input(s): APTT, INR in the last 168 hours.  Sepsis Markers Recent Labs  Lab 09/09/2017 1936 09/04/2017 2215 09/01/2017 0213 09/12/17 0241 09/13/17 0500  LATICACIDVEN 7.33* 7.4* 5.0*  --   --   PROCALCITON  --   --   --  4.82 6.07   ABG Recent Labs  Lab 09/03/2017 1516 08/25/2017 1559 09/13/17 1138  PHART 7.518* 7.488* 7.384  PCO2ART 25.6* 28.6* 21.3*  PO2ART 436.0* 408.0* 86.5   Liver Enzymes Recent Labs  Lab 09/07/2017 1802 09/12/17 0240 09/13/17 0900 09/14/17 0011  AST 47*  --  87*  --   ALT 40  --  33  --   ALKPHOS 151*  --  129*  --   BILITOT 0.9  --  1.6*  --   ALBUMIN 1.2* 1.5* 1.2* 1.0*   Cardiac Enzymes Recent Labs  Lab 08/19/2017 2215 08/24/2017 0213  TROPONINI 0.38* 0.36*   Glucose Recent Labs  Lab 09/14/17 2025 09/14/17 2320 09/14/17 2323  09/15/17 0335 09/15/17 0359 09/15/17 0748  GLUCAP 142* 16* 89 62* 258* 115*   Imaging No results found.I have reviewed the images personally  STUDIES:   CULTURES: Blood 12/23 >> Neg  ANTIBIOTICS: Vancomycin 12/23 >> Zosyn 12/23 >>   SIGNIFICANT EVENTS: 12/23 > Presents to ED   LINES/TUBES: ETT 12/24 > Lt CVL IJ 12/24 > Lt femoral a line 12/24 >12/27  Resolved medical issues: Ventilator dependence  DISCUSSION: 83 year old male with known gangrenous right leg, seen by Dr. Sharol Given in office, planned above knee amputation January 2. Presents to ED septic  Underwent rt leg amputation 12/24; acid-base continues to worsen, he did not tolerate dialysis, remains in refractory shock/multiple organ failure.  He currently is awake I think he could protect his airway and if  his minute ventilation is adequate this may be the only time we can extubate him and allow him to interact with his family.  I do not think he is going to survive this hospitalization, certainly he will not if he is no longer a candidate for dialysis.  ASSESSMENT / PLAN:  Septic Shock in setting of gangrenous right leg status post amputation 12/24 Demand Ischemia Tn1 0.36 Aortic Stenosis (EF on 03/17/17 50-55)  H/O HLD, HTN,  PVD S/P transmetatarsal amputation of right foot   Plan Restarted levophed overnight, increasing now that HD is in place with -3L today Continue stress dose steroids, no taper given overnight events Continue telemetry monitoring Day #6 Zosyn and vancomycin.  Culture data is all negative because of this we will discontinue antibiotics at day #7 Midodrine as ordered  Lactic Acidosis in setting of septic shock  ESRD, HD M/W/F  Acid base imbalance worse, did not tolerate dialysis yesterday.  Without dialysis he will not survive this illness Acid-base improved with bicarbonate infusion Plan D/C bicarb drip D5W ordered for hypoglycemia Daily renal profiles HD today and will need to discuss longterm plan here.  GERD Protein Calorie Malnutrition  Plan N.P.O. Given aspiration yesterday, will order SLP Diet per SLP Once able to take diet then d/c D5W  Anemia of Chronic disease   -Hemoglobin drifting down however suspect this is somewhat hemo-dilutional Plan Trend CBC Transfuse as indicated  DM    Hypoglycemia Plan Sliding scale insulin  Metabolic Encephalopathy  H/O Dementia  P:   Supportive care As needed analgesia for surgical pain management  FAMILY: Full DNR per previous conversations with family but pressors ok.  Will need to discuss with family realistic expectations given comorbidities and overall health.  DVT prophylaxis: [place] heparin SUP: ppi   Diet: NPO, allow clears  Activity: bedrest Disposition : ICU  The patient is critically ill with  multiple organ systems failure and requires high complexity decision making for assessment and support, frequent evaluation and titration of therapies, application of advanced monitoring technologies and extensive interpretation of multiple databases.   Critical Care Time devoted to patient care services described in this note is  35  Minutes. This time reflects time of care of this signee Dr Jennet Maduro. This critical care time does not reflect procedure time, or teaching time or supervisory time of PA/NP/Med student/Med Resident etc but could involve care discussion time.  Rush Farmer, M.D. [facility] Pulmonary/Critical Care Medicine. Pager: [phone]. After hours pager: [phone].

## 2017-09-15 NOTE — Progress Notes (Signed)
CRITICAL VALUE ALERT  Critical Value:  CBG 62  Date & Time Notied:  09/15/2017  0400  Provider Notified: Dr Wonda Amis  Orders Received/Actions taken: Dextrose 50%  25 gm given,  Repeat  CBG 258.   Dr Wonda Amis Notified and new order received for D5W @ 50 cc/hr

## 2017-09-15 NOTE — Evaluation (Signed)
Clinical/Bedside Swallow Evaluation Patient Details  Name: Steven Forbes MRN: [id number] Date of Birth: 09/01/1926  Today's Date: 09/15/2017 Time: SLP Start Time (ACUTE ONLY): 1440 SLP Stop Time (ACUTE ONLY): 1508 SLP Time Calculation (min) (ACUTE ONLY): 28 min  Past Medical History:  Past Medical History:  Diagnosis Date  . Anemia   . Asthma   . Dementia   . Diabetes mellitus    type 2  . ESRD (end stage renal disease) ([place])    Dialysis on MONDAY, [place] and FRIDAYS  . Flu 08/2014  . GERD (gastroesophageal reflux disease)   . History of blood transfusion   . HOH (hard of hearing)   . Hx of echocardiogram    a.  Echocardiogram (09/10/2013): EF 50-55%, normal wall motion, grade 2 diastolic dysfunction, mild aortic stenosis, trivial AI, MAC, moderate MR, mild LAE, moderate to severe TR, moderately increased PASP.  [name] Hx of non-ST elevation myocardial infarction (NSTEMI)    a. in setting of influenza, volume overload 08/2013 => Lexiscan Myoview (09/11/2013): Subtle ischemia in the anteroseptal wall, EF 44%. => med Rx recommended  . Hyperlipidemia   . Hypertension   . Shortness of breath    with exertion   Past Surgical History:  Past Surgical History:  Procedure Laterality Date  . AMPUTATION Right 08/26/2017   Procedure: AMPUTATION ABOVE KNEE;  Surgeon: Mcarthur Rossetti, MD;  Location: [place];  Service: Orthopedics;  Laterality: Right;  . ANTERIOR APPROACH HEMI HIP ARTHROPLASTY Left 03/18/2017   Procedure: ANTERIOR APPROACH HEMI HIP ARTHROPLASTY LEFT;  Surgeon: Leandrew Koyanagi, MD;  Location: [place];  Service: Orthopedics;  Laterality: Left;  . AV FISTULA PLACEMENT  2008   Left Upper Arm  . COLONOSCOPY    . EYE SURGERY Bilateral    cataract surgery  . LAPAROSCOPIC GASTROTOMY W/ REPAIR OF ULCER    . REVISON OF ARTERIOVENOUS FISTULA Left 11/21/2013   Procedure: REVISION OF ARTERIOVENOUS FISTULA;  Surgeon: Angelia Mould, MD;  Location: [place];  Service: Vascular;   Laterality: Left;  . REVISON OF ARTERIOVENOUS FISTULA Left 02/18/2016   Procedure: excision of ulcerated skin over left brachio-cephalic AV fistula x 2;  Surgeon: [name] Misty, MD;  Location: [place];  Service: Vascular;  Laterality: Left;  . TOE AMPUTATION Right    all toes   HPI:  83 year old male with extensive PMH including dementia, DM, ESRD on HD M/W/F, GERD, Anemia, HLD, HTN, Aortic Stenosis, right transmetatarsal amputation, and known gangrenous right leg. s/p right leg amputation 12/24, intubated and extubated 12/26. Evaluated by SLP 01/2017 and noted to have a primarily cognitive based dysphagia with recommendations for dysphagia 1 with thin liquid.    Assessment / Plan / Recommendation Clinical Impression  Patient presents with evidence of an oropharyngeal dysphagia. Cognitively, patient with poor sustained attention resulting in decreased awareness of bolus, oral holding, prolonged manipulation of bolus, delayed oral transit time. Pharyngeally, patient with evidence of decreased airway protection characterized by immediate wet vocal quality, delayed throat clear and cough indicative of decreased airway protection. Although large bore NG tube may be playing a role, do not suspect this is primary cause for worsening dysphagia from baseline (primarily oral). Recent intubation, dementia, and declining medical status likely all contributing. Discussed results and options with family present including NPO with MBS 12/29 to further evaluate function and determine potential for a po diet, continuing po diet of choice for comfort, etc. Decision made by family to proceed with pureed solids, thin liquids  with known risk of aspiration. Family would however like to have MBS in am 12/29 for more definitive diagnosis of swallowing deficits.  SLP Visit Diagnosis: Dysphagia, oropharyngeal phase (R13.12)    Aspiration Risk  Severe aspiration risk    Diet Recommendation Dysphagia 1 (Puree);Thin liquid    Liquid Administration via: Cup Medication Administration: Crushed with puree Supervision: Full supervision/cueing for compensatory strategies Compensations: Slow rate;Small sips/bites Postural Changes: Seated upright at 90 degrees    Other  Recommendations Oral Care Recommendations: Oral care BID   Follow up Recommendations None        Swallow Study   General HPI: 83 year old male with extensive PMH including dementia, DM, ESRD on HD M/W/F, GERD, Anemia, HLD, HTN, Aortic Stenosis, right transmetatarsal amputation, and known gangrenous right leg. s/p right leg amputation 12/24, intubated and extubated 12/26. Evaluated by SLP 01/2017 and noted to have a primarily cognitive based dysphagia with recommendations for dysphagia 1 with thin liquid.  Type of Study: Bedside Swallow Evaluation Previous Swallow Assessment: see hpi Diet Prior to this Study: Thin liquids Temperature Spikes Noted: No Respiratory Status: Room air History of Recent Intubation: Yes Length of Intubations (days): 2 days Date extubated: 09/13/17 Behavior/Cognition: Alert;Cooperative;Confused;Requires cueing Oral Cavity Assessment: Within Functional Limits Oral Care Completed by SLP: Recent completion by staff Oral Cavity - Dentition: Edentulous Vision: Functional for self-feeding Self-Feeding Abilities: Needs assist Patient Positioning: Upright in bed Baseline Vocal Quality: Normal Volitional Cough: Cognitively unable to elicit Volitional Swallow: Unable to elicit    Oral/Motor/Sensory Function Overall Oral Motor/Sensory Function: Other (comment)(appears WFL, unable to participate in full exam)   Ice Chips Ice chips: Impaired Presentation: Spoon Oral Phase Impairments: Poor awareness of bolus;Impaired mastication Oral Phase Functional Implications: Prolonged oral transit;Oral residue;Oral holding Pharyngeal Phase Impairments: Multiple swallows;Throat Clearing - Immediate;Wet Vocal Quality   Thin Liquid Thin  Liquid: Impaired Presentation: Cup Oral Phase Impairments: Poor awareness of bolus Oral Phase Functional Implications: Oral holding Pharyngeal  Phase Impairments: Suspected delayed Swallow;Decreased hyoid-laryngeal movement;Wet Vocal Quality;Throat Clearing - Delayed;Cough - Delayed    Nectar Thick Nectar Thick Liquid: Impaired Presentation: Spoon Oral Phase Impairments: Impaired mastication;Poor awareness of bolus Oral phase functional implications: Prolonged oral transit;Oral residue;Oral holding Pharyngeal Phase Impairments: Suspected delayed Swallow;Multiple swallows;Wet Vocal Quality;Throat Clearing - Delayed;Cough - Delayed   Honey Thick Honey Thick Liquid: Not tested   Puree Puree: Impaired Presentation: Spoon Oral Phase Impairments: Poor awareness of bolus;Impaired mastication Oral Phase Functional Implications: Prolonged oral transit;Oral residue;Oral holding   Solid   Lorretta Kerce MA, CCC-SLP [phone]    Solid: Not tested        Rayjon Wery Meryl 09/15/2017,3:22 PM

## 2017-09-16 DIAGNOSIS — E440 Moderate protein-calorie malnutrition: Secondary | ICD-10-CM

## 2017-09-16 LAB — GLUCOSE, CAPILLARY
GLUCOSE-CAPILLARY: 142 mg/dL — AB (ref 65–99)
GLUCOSE-CAPILLARY: 95 mg/dL (ref 65–99)
GLUCOSE-CAPILLARY: 97 mg/dL (ref 65–99)
Glucose-Capillary: 195 mg/dL — ABNORMAL HIGH (ref 65–99)
Glucose-Capillary: 78 mg/dL (ref 65–99)
Glucose-Capillary: 95 mg/dL (ref 65–99)
Glucose-Capillary: 99 mg/dL (ref 65–99)

## 2017-09-16 LAB — CBC
HCT: 22.7 % — ABNORMAL LOW (ref 39.0–52.0)
HEMOGLOBIN: 7.7 g/dL — AB (ref 13.0–17.0)
MCH: 29.7 pg (ref 26.0–34.0)
MCHC: 33.9 g/dL (ref 30.0–36.0)
MCV: 87.6 fL (ref 78.0–100.0)
PLATELETS: 121 10*3/uL — AB (ref 150–400)
RBC: 2.59 MIL/uL — AB (ref 4.22–5.81)
RDW: 21 % — ABNORMAL HIGH (ref 11.5–15.5)
WBC: 21.6 10*3/uL — AB (ref 4.0–10.5)

## 2017-09-16 LAB — BASIC METABOLIC PANEL
ANION GAP: 11 (ref 5–15)
BUN: 7 mg/dL (ref 6–20)
CO2: 23 mmol/L (ref 22–32)
Calcium: 6.7 mg/dL — ABNORMAL LOW (ref 8.9–10.3)
Chloride: 99 mmol/L — ABNORMAL LOW (ref 101–111)
Creatinine, Ser: 1.82 mg/dL — ABNORMAL HIGH (ref 0.61–1.24)
GFR, EST AFRICAN AMERICAN: 36 mL/min — AB (ref >60)
GFR, EST NON AFRICAN AMERICAN: 31 mL/min — AB (ref >60)
Glucose, Bld: 192 mg/dL — ABNORMAL HIGH (ref 65–99)
POTASSIUM: 3.3 mmol/L — AB (ref 3.5–5.1)
SODIUM: 133 mmol/L — AB (ref 135–145)

## 2017-09-16 LAB — PHOSPHORUS: PHOSPHORUS: 2.8 mg/dL (ref 2.5–4.6)

## 2017-09-16 LAB — HEPATITIS B SURFACE ANTIGEN: HEP B S AG: NEGATIVE

## 2017-09-16 LAB — MAGNESIUM: MAGNESIUM: 1.5 mg/dL — AB (ref 1.7–2.4)

## 2017-09-16 MED ORDER — VANCOMYCIN HCL 500 MG IV SOLR
500.0000 mg | Freq: Once | INTRAVENOUS | Status: AC
  Administered 2017-09-16: 500 mg via INTRAVENOUS
  Filled 2017-09-16: qty 500

## 2017-09-16 MED ORDER — HEPARIN SODIUM (PORCINE) 1000 UNIT/ML DIALYSIS: 1000.0000 [IU] | INTRAMUSCULAR | Status: DC | PRN

## 2017-09-16 MED ORDER — ALTEPLASE 2 MG IJ SOLR
2.0000 mg | Freq: Once | INTRAMUSCULAR | Status: DC | PRN
  Filled 2017-09-16: qty 2

## 2017-09-16 MED ORDER — PENTAFLUOROPROP-TETRAFLUOROETH EX AERO: 1.0000 "application " | INHALATION_SPRAY | CUTANEOUS | Status: DC | PRN

## 2017-09-16 MED ORDER — ALBUMIN HUMAN 25 % IV SOLN
25.0000 g | Freq: Once | INTRAVENOUS | Status: AC
  Administered 2017-09-16: 25 g via INTRAVENOUS

## 2017-09-16 MED ORDER — HEPARIN SODIUM (PORCINE) 1000 UNIT/ML DIALYSIS
2000.0000 [IU] | Freq: Once | INTRAMUSCULAR | Status: AC
  Administered 2017-09-16: 2000 [IU] via INTRAVENOUS_CENTRAL

## 2017-09-16 MED ORDER — SODIUM CHLORIDE 0.9 % IV SOLN: 100.0000 mL | INTRAVENOUS | Status: DC | PRN

## 2017-09-16 MED ORDER — LIDOCAINE-PRILOCAINE 2.5-2.5 % EX CREA
1.0000 "application " | TOPICAL_CREAM | CUTANEOUS | Status: DC | PRN
  Filled 2017-09-16: qty 5

## 2017-09-16 MED ORDER — VANCOMYCIN HCL 500 MG IV SOLR
500.0000 mg | INTRAVENOUS | Status: DC
  Filled 2017-09-16: qty 500

## 2017-09-16 MED ORDER — LIDOCAINE HCL (PF) 1 % IJ SOLN: 5.0000 mL | INTRAMUSCULAR | Status: DC | PRN

## 2017-09-16 MED ORDER — ALBUMIN HUMAN 25 % IV SOLN
INTRAVENOUS | Status: AC
  Administered 2017-09-16: 25 g via INTRAVENOUS
  Filled 2017-09-16: qty 200

## 2017-09-16 NOTE — Progress Notes (Signed)
Dialysis treatment completed.  2000 mL ultrafiltrated.  1500 mL net fluid removal.  Patient status unchanged. Lung sounds diminished to ausculation in all fields. Generalized edema. Cardiac: NSR.  Cleansed RIJ catheter with chlorhexidine.  Disconnected lines and flushed ports with saline per protocol.  Ports locked with heparin and capped per protocol.    Report given to bedside, RN Clarene Critchley.

## 2017-09-16 NOTE — Progress Notes (Signed)
SLP Cancellation Note  Patient Details Name: Steven Forbes MRN: [id number] DOB: Oct 25, 1925   Cancelled treatment:       Reason Eval/Treat Not Completed: Patient at procedure or test/unavailable. Per RN, pt unavailable for MBS, just beginning HD. Spoke with family this morning, who verbalized understanding of risks for aspiration and expressed desire to continue feeding pt orally. Will f/u next date.  Deneise Lever, [place], CCC-SLP Speech-Language Pathologist [phone]   Aliene Altes 09/16/2017, 1:43 PM

## 2017-09-16 NOTE — Progress Notes (Signed)
Pharmacy Antibiotic Note  Steven Forbes is a 83 y.o. male admitted on 09/02/2017 with sepsis.  Pharmacy has been consulted for Vancomycin/Zosyn dosing. ESRD on HD MWF. S/p AKA 12/24. Last HD on Tuesday 12/25. Patient receiving HD session in his room right now.  WBC down to 21.6 and patient afebrile.   Plan: Vancomycin 500 mg X 1 post-HD today- Last day of therapy today  Zosyn 3.375 gm every 12 hours - Stop date in Monitor clinical s/sx of infection and HD sessions    Height: 5\' 5"  (165.1 cm) Weight: 124 lb 9 oz (56.5 kg) IBW/kg (Calculated) : 61.5  Temp (24hrs), Avg:98.2 F (36.8 C), Min:97.7 F (36.5 C), Max:99.1 F (37.3 C)  Recent Labs  Lab 09/16/2017 1811 08/19/2017 1936 09/07/2017 2215 08/30/2017 0213  09/13/17 0900 09/14/17 0010 09/14/17 0011 09/14/17 0326 09/15/17 0930 09/16/17 0321  WBC  --   --   --  30.8*   < > 39.5* 29.4*  --  30.5* 29.2* 21.6*  CREATININE  --   --   --  1.53*   < > 2.25*  --  2.79* 2.44* 2.89* 1.82*  LATICACIDVEN 7.49* 7.33* 7.4* 5.0*  --   --   --   --   --   --   --    < > = values in this interval not displayed.    Estimated Creatinine Clearance: 22 mL/min (A) (by C-G formula based on SCr of 1.82 mg/dL (H)).    No Known Allergies   Vanc 12/23>>(12/30) Zosyn 12/23>>(12/30)  12/23 Blood - NGTD 12/23 MRSA - [place], PharmD, BCPS PGY2 Infectious Diseases Pharmacy Resident Phone: Rhunette Croft [phone]  09/16/2017 1:49 PM

## 2017-09-16 NOTE — Progress Notes (Signed)
PULMONARY / CRITICAL CARE MEDICINE   Name: Haedyn Ancrum MRN: [id number] DOB: 10/23/1925    ADMISSION DATE:  08/27/2017 CONSULTATION DATE:  08/23/2017  REFERRING MD:  Dr. Ericka Pontiff   CHIEF COMPLAINT:  AMS   HISTORY OF PRESENT ILLNESS:   83 year old male with extensive PMH including dementia, DM, ESRD on HD M/W/F, GERD, Anemia, HLD, HTN, Aortic Stenosis, right transmetatarsal amputation, and known gangrenous right leg  Presents to ED from home with progressive lethargy. Per family patient was scheduled January 2 for AKA of right leg. Presented hypotensive, LA 7.49, given 2 L Bolus, Vancomycin/Zosyn and started on Levophed Gtt. PCCM called for admission.    Orthopedics was consulted. Agree with surgery 12/24. However, have discussed with family high risk due to multiple co-morbidites and current state of health.   SUBJECTIVE:  Low dose pressors, plan is not to escalate. Volume overloaded, plan for HD 12/28  VITAL SIGNS: BP (!) 107/58   Pulse 100   Temp 98.1 F (36.7 C) (Oral)   Resp 17   Ht 5\' 5"  (1.651 m)   Wt 124 lb 9 oz (56.5 kg)   SpO2 100%   BMI 20.73 kg/m   HEMODYNAMICS:    VENTILATOR SETTINGS:    INTAKE / OUTPUT:  Intake/Output Summary (Last 24 hours) at 09/16/2017 1214 Last data filed at 09/16/2017 1000 Gross per 24 hour  Intake 1216.2 ml  Output 2200 ml  Net -983.8 ml   PHYSICAL EXAMINATION: General: Chronically ill appearing male hypotensive on low dose pressors Pulmonary: CTA bilaterally, crackles per bases Cardiac: RRR, Nl S1/S2 and -M/R/G Abdomen: Soft, NT, ND and +BS Extremities: Right AKA unremarkable, warm and dry otherwise  + diffuse anasarca Neuro/psych: Awake, interactive, moves extremities, confused at baseline, very hard of hearing.  LABS:  BMET Recent Labs  Lab 09/14/17 0326 09/15/17 0930 09/16/17 0321  NA 132* 132* 133*  K 3.0* 3.0* 3.3*  CL 99* 94* 99*  CO2 21* 23 23  BUN 12 16 7   CREATININE 2.44* 2.89* 1.82*  GLUCOSE 110*  109* 192*   Electrolytes Recent Labs  Lab 09/09/2017 0213  09/14/17 0011 09/14/17 0326 09/15/17 0930 09/16/17 0321  CALCIUM 7.1*   < > 6.5* 6.2* 6.2* 6.7*  MG 1.6*  --   --   --   --  1.5*  PHOS 1.6*   < > 3.6  --  3.8 2.8   < > = values in this interval not displayed.   CBC Recent Labs  Lab 09/14/17 0326 09/15/17 0930 09/16/17 0321  WBC 30.5* 29.2* 21.6*  HGB 7.9* 7.9* 7.7*  HCT 23.8* 23.5* 22.7*  PLT 167 130* 121*   Coag's No results for input(s): APTT, INR in the last 168 hours.  Sepsis Markers Recent Labs  Lab 08/21/2017 1936 08/26/2017 2215 08/24/2017 0213 09/12/17 0241 09/13/17 0500  LATICACIDVEN 7.33* 7.4* 5.0*  --   --   PROCALCITON  --   --   --  4.82 6.07   ABG Recent Labs  Lab 09/17/2017 1516 08/19/2017 1559 09/13/17 1138  PHART 7.518* 7.488* 7.384  PCO2ART 25.6* 28.6* 21.3*  PO2ART 436.0* 408.0* 86.5   Liver Enzymes Recent Labs  Lab 09/13/2017 1802  09/13/17 0900 09/14/17 0011 09/15/17 0930  AST 47*  --  87*  --   --   ALT 40  --  33  --   --   ALKPHOS 151*  --  129*  --   --   BILITOT 0.9  --  1.6*  --   --   ALBUMIN 1.2*   < > 1.2* 1.0* 1.0*   < > = values in this interval not displayed.   Cardiac Enzymes Recent Labs  Lab 08/23/2017 2215 09/12/2017 0213  TROPONINI 0.38* 0.36*   Glucose Recent Labs  Lab 09/15/17 0748 09/15/17 1127 09/15/17 1610 09/15/17 2009 09/16/17 0331 09/16/17 0819  GLUCAP 115* 105* 136* 146* 142* 99   Imaging No results found.I have reviewed the images personally  STUDIES:   CULTURES: Blood 12/23 >> Neg  ANTIBIOTICS: Vancomycin 12/23 >> Zosyn 12/23 >>   SIGNIFICANT EVENTS: 12/23 > Presents to ED  12/24 > Right Leg amputation  LINES/TUBES: ETT 12/24 > 12/26 Lt CVL IJ 12/24 > Lt femoral a line 12/24 >12/27  Resolved medical issues: Ventilator dependence  DISCUSSION: 83 year old male with known gangrenous right leg, seen by Dr. Sharol Given in office, planned above knee amputation January 2. Presents to  ED septic  Underwent rt leg amputation 12/24; acid-base continues to worsen, he did not tolerate dialysis, remains in refractory shock/multiple organ failure.  He currently is awake I think he could protect his airway and if his minute ventilation is adequate this may be the only time we can extubate him and allow him to interact with his family.  I do not think he is going to survive this hospitalization, certainly he will not if he is no longer a candidate for dialysis.  ASSESSMENT / PLAN:  Septic Shock in setting of gangrenous right leg status post amputation 12/24 Demand Ischemia Tn1 0.36 Aortic Stenosis (EF on 03/17/17 50-55)  H/O HLD, HTN,  PVD S/P transmetatarsal amputation of right foot   Plan Restarted levophed overnight 12/28, increasing now that HD is in place with -3L today Continue stress dose steroids, no taper given overnight events Continue telemetry monitoring Day # 7 Zosyn and vancomycin.  Culture data is all negative because of this we will discontinue antibiotics after  day #7, 12/29 Midodrine as ordered Pharmacy to adjust ABX dosing for HD  Lactic Acidosis in setting of septic shock  ESRD, HD M/W/F  Hypokalemia Acid base imbalance worse, did not tolerate dialysis 12/28.   Without dialysis he will not survive this illness Acid-base improved with bicarbonate infusion Volume overload 12/29 For HD again 12/29 Plan D/C bicarb drip 12/28 D5W ordered for hypoglycemia Daily renal profiles HD today, 12/29 and will need to discuss longterm plan with family. Correct K with HD today  GERD Protein Calorie Malnutrition For swallow eval due to aspiration event 12/27 Family continue to feed patient despite staff requesting wait until after swallow. They verbalize understanding that they are risking aspiration  Plan N.P.O. Given aspiration 12/27, will order SLP Diet per SLP Once able to take diet then d/c D5W  Anemia of Chronic disease   -Hemoglobin drifting down  however suspect this is somewhat hemo-dilutional - Volume overloaded  Plan Trend CBC Transfuse as indicated Monitor for bleeding darbe SQ  per renal    DM    Hypoglycemia  Plan CBG Q 4 D5W for hypoglycemia Sliding scale insulin  Metabolic Encephalopathy  H/O Dementia  Confused at baseline P:   Supportive care As needed analgesia for surgical pain management, but minimize.  FAMILY: Full DNR per previous conversations with family but pressors ok. We are not going to increase dose or escalate.  Will need to discuss with family realistic expectations given comorbidities and overall health. Family continue to feed patient despite pending swallow eval and  understand they are risking aspiration pneumonia by doing so. ( Verbalized understanding). They do not have  realistic goals of care. Feel the patient has lived until 62 because of good care from family and " We have been at this for a long time and we know what we are doing".  DVT prophylaxis: [place] heparin SUP: ppi   Diet: NPO, swallow pending  Activity: bedrest Disposition : ICU   Magdalen Spatz, [place]. Pager: [phone].

## 2017-09-16 NOTE — Progress Notes (Signed)
[facility] Progress Note  Subjective: 2L off with HD yest, still on pressors, levo gtt at 2 ug/ min  Vitals:   09/16/17 0800 09/16/17 0811 09/16/17 0900 09/16/17 1000  BP: 109/63  (!) 107/58   Pulse: 94  89 100  Resp: _0 Temp:  98.1 F (36.7 C)    TempSrc:  Oral    SpO2: 100%  100% 100%  Weight:      Height:        Inpatient medications: . chlorhexidine gluconate (MEDLINE KIT)  15 mL Mouth Rinse BID  . heparin  5,000 Units Subcutaneous Q8H  . hydrocortisone sod succinate (SOLU-CORTEF) inj  50 mg Intravenous Q6H  . insulin aspart  2-6 Units Subcutaneous Q4H  . mouth rinse  15 mL Mouth Rinse QID  . midodrine  10 mg Oral TID WC  . pantoprazole (PROTONIX) IV  40 mg Intravenous Q24H   . sodium chloride 10 mL/hr at 09/15/17 0400  . dextrose 10 mL/hr at 09/15/17 2352  . norepinephrine (LEVOPHED) Adult infusion 10 mcg/min (09/15/17 1900)  . piperacillin-tazobactam (ZOSYN)  IV 3.375 g (09/16/17 0955)  . vancomycin Stopped (09/15/17 1332)   Place/Maintain arterial line **AND** sodium chloride, fentaNYL (SUBLIMAZE) injection, ipratropium-albuterol  Exam: Lying in 30 deg, no distress, talking but confused NO jvd Chest bilat rales 1/2 up post RRR tachy no RG Abd nondist, dec'd BS Ext diffuse 2-3+ LE/ UE edema R IJ TDC    Dialysis: [facility] MWF  4h   51.5kg  3K/2.25 bath  Hep 2500   R IJ cath 180NRe 400/auto 1.5   UFP 4 -Mircera 50 mcg IV q 2 weeks (last dose 08/31/17 Last HGB 9.0 09/06/17)      Impression: 1. Sepsis/ gangrenous leg - sp R AKA 12/24 per ortho 2. Shock / hypotension - on minimal dose levo gtt 3. ESRD - usual HD is MWF.  Not CRRT candidate. Needs sig vol off. HD today.  4. Volume overload - marked vol overload/ anasarca, minimize all IV and po fluids  5. Anemia of CKD  - HGB 8- 9 range. Give darbe SQ   6. Metabolic bone disease -  No binders/VDRA. Ca 7 C Ca 8.9 7. DM: per primary 8. DNR 9. Dementia - at baseline   Plan - as  above   Stephen pager [phone]   09/16/2017, 11:30 AM   Recent Labs  Lab 09/14/17 0011 09/14/17 0326 09/15/17 0930 09/16/17 0321  NA 132* 132* 132* 133*  K 3.0* 3.0* 3.0* 3.3*  CL 94* 99* 94* 99*  CO2 24 21* 23 23  GLUCOSE 87 110* 109* 192*  BUN _1 CREATININE 2.79* 2.44* 2.89* 1.82*  CALCIUM 6.5* 6.2* 6.2* 6.7*  PHOS 3.6  --  3.8 2.8   Recent Labs  Lab 09/14/2017 1802  09/13/17 0900 09/14/17 0011 09/15/17 0930  AST 47*  --  87*  --   --   ALT 40  --  33  --   --   ALKPHOS 151*  --  129*  --   --   BILITOT 0.9  --  1.6*  --   --   PROT 4.5*  --  4.4*  --   --   ALBUMIN 1.2*   < > 1.2* 1.0* 1.0*   < > = values in this interval not displayed.   Recent Labs  Lab 08/27/2017 1802  09/14/17 0326 09/15/17 0930 09/16/17  0321  WBC 20.9*   < > 30.5* 29.2* 21.6*  NEUTROABS 18.2*  --   --   --   --   HGB 8.5*   < > 7.9* 7.9* 7.7*  HCT 26.5*   < > 23.8* 23.5* 22.7*  MCV 94.0   < > 89.8 88.3 87.6  PLT 174   < > 167 130* 121*   < > = values in this interval not displayed.   Iron/TIBC/Ferritin/ %Sat    Component Value Date/Time   IRON 31 (L) 05/27/2011 0757   TIBC 170 (L) 05/27/2011 0757   FERRITIN 840 (H) 05/27/2011 0757   IRONPCTSAT 18 (L) 05/27/2011 0601

## 2017-09-16 NOTE — Progress Notes (Signed)
Arrived to patient room 39M-09 at 1315.  Reviewed treatment plan and this RN agrees with plan.  Report received from bedside RN, Clarene Critchley.  Consent verified.  Patient Alert, oriented to self only.   Lung sounds diminished to ausculation in all fields. BLE 1+ pitting edema. Cardiac:  NSR.  Removed caps and cleansed RIJ catheter with chlorhedxidine.  Aspirated ports of heparin and flushed them with saline per protocol.  Connected and secured lines, initiated treatment at 1330.  UF Goal of 3000 mL and net fluid removal 2.5 L.  Will continue to monitor.

## 2017-09-17 ENCOUNTER — Inpatient Hospital Stay (HOSPITAL_COMMUNITY): Payer: Medicare Other

## 2017-09-17 LAB — BASIC METABOLIC PANEL
Anion gap: 12 (ref 5–15)
BUN: 5 mg/dL — AB (ref 6–20)
CHLORIDE: 98 mmol/L — AB (ref 101–111)
CO2: 25 mmol/L (ref 22–32)
CREATININE: 1.47 mg/dL — AB (ref 0.61–1.24)
Calcium: 6.9 mg/dL — ABNORMAL LOW (ref 8.9–10.3)
GFR calc Af Amer: 47 mL/min — ABNORMAL LOW (ref >60)
GFR, EST NON AFRICAN AMERICAN: 40 mL/min — AB (ref >60)
GLUCOSE: 105 mg/dL — AB (ref 65–99)
POTASSIUM: 3.6 mmol/L (ref 3.5–5.1)
Sodium: 135 mmol/L (ref 135–145)

## 2017-09-17 LAB — GLUCOSE, CAPILLARY
GLUCOSE-CAPILLARY: 14 mg/dL — AB (ref 65–99)
GLUCOSE-CAPILLARY: 98 mg/dL (ref 65–99)
Glucose-Capillary: 98 mg/dL (ref 65–99)
Glucose-Capillary: 122 mg/dL — ABNORMAL HIGH (ref 65–99)
Glucose-Capillary: 124 mg/dL — ABNORMAL HIGH (ref 65–99)
Glucose-Capillary: 68 mg/dL (ref 65–99)

## 2017-09-17 LAB — CBC
HCT: 22 % — ABNORMAL LOW (ref 39.0–52.0)
Hemoglobin: 7.3 g/dL — ABNORMAL LOW (ref 13.0–17.0)
MCH: 29.6 pg (ref 26.0–34.0)
MCHC: 33.2 g/dL (ref 30.0–36.0)
MCV: 89.1 fL (ref 78.0–100.0)
PLATELETS: 84 10*3/uL — AB (ref 150–400)
RBC: 2.47 MIL/uL — AB (ref 4.22–5.81)
RDW: 21.5 % — ABNORMAL HIGH (ref 11.5–15.5)
WBC: 20.3 10*3/uL — ABNORMAL HIGH (ref 4.0–10.5)

## 2017-09-17 MED ORDER — DEXTROSE 50 % IV SOLN
INTRAVENOUS | Status: AC
  Administered 2017-09-17: 50 mL
  Filled 2017-09-17: qty 50

## 2017-09-17 MED ORDER — MIDODRINE HCL 5 MG PO TABS
20.0000 mg | ORAL_TABLET | Freq: Two times a day (BID) | ORAL | Status: DC
  Administered 2017-09-17 – 2017-09-18 (×2): 20 mg via ORAL
  Filled 2017-09-17 (×2): qty 4

## 2017-09-17 NOTE — Progress Notes (Signed)
  Speech Language Pathology Treatment: Dysphagia  Patient Details Name: Steven Forbes MRN: [id number] DOB: 30-Dec-1925 Today's Date: 09/17/2017 Time: 3016-0109 SLP Time Calculation (min) (ACUTE ONLY): 8 min  Assessment / Plan / Recommendation Clinical Impression  Diagnostic treatment complete at bedside this am to ensure that MBS was still warranted this pm (unable to complete 12/29 due to HD). Patient alert and cooperative, confused. Continues to present with evidence of both an oral and pharyngeal dysphagia characterized by oral holding requiring moderate verbal cueing for oral transit of bolus and initiation of swallow, primarily with solids, and multiple swallows, wet vocal quality, throat clearing, suggestive of pharyngeal deficits with decreased airway protection. Discussed with daughter. Will proceed with MBS this pm. Family aware of aspiration risk and wishes to continue clear liquids.    HPI HPI: 83 year old male with extensive PMH including dementia, DM, ESRD on HD M/W/F, GERD, Anemia, HLD, HTN, Aortic Stenosis, right transmetatarsal amputation, and known gangrenous right leg. s/p right leg amputation 12/24, intubated and extubated 12/26. Evaluated by SLP 01/2017 and noted to have a primarily cognitive based dysphagia with recommendations for dysphagia 1 with thin liquid.       SLP Plan  MBS       Recommendations  Diet recommendations: Thin liquid(clear liquid per family discretion) Medication Administration: Crushed with puree Compensations: Slow rate;Small sips/bites                Oral Care Recommendations: Oral care BID Follow up Recommendations: None SLP Visit Diagnosis: Dysphagia, oropharyngeal phase (R13.12) Plan: MBS       GO             Careen Mauch [place], CCC-SLP [phone]    Parminder Cupples Meryl 09/17/2017, 11:02 AM

## 2017-09-17 NOTE — Progress Notes (Signed)
Modified Barium Swallow Progress Note  Patient Details  Name: Steven Forbes MRN: [id number] Date of Birth: 01/11/1926  Today's Date: 09/17/2017  Modified Barium Swallow completed.  Full report located under Chart Review in the Imaging Section.  Brief recommendations include the following:  Clinical Impression  Patient presents with a severe oropharyngeal dysphagia with cognitive and mechanical deficits. Cognitively, patient with extremely poor sustained attention to to task and poor awareness resulting in severe oral holding, prolonged and often absent oral transit of bolus, speaking with bolus in oral cavity with partial bolus eventually spilling to the vallecula prior to intiating a swallow. Pharyngeally, patient with muscle weakness resulting in severe residuals post swallow with eventual penetration to the vocal cords without response. Unable to visualize below the cords during today's study despite multiple attempts at repositioning but strongly suspect aspiration. At this time, all po consistencies hold high risk of aspiration as cognitively, patient unable to follow directions for attempts at improved airway protection. Note that NG tube remained in place for todays study as RN concerned that patient would not allow replacement if taken out. Do not feel that swallowing function would be significantly improved with NG tuibe removal based on todays study. Recommend palliative care re-visit with patient and family to assist in decision making should family wish to consider po feeds. May consider dysphagia 1, thin for comfort with known risk of aspiration. SLP will f/u for education. Family not present after study today.    Swallow Evaluation Recommendations    SLP Diet Recommendations: NPO    Medication Administration: Via alternative means    Oral Care Recommendations: Oral care QID    Gabriel Rainwater MA, CCC-SLP [phone]   Steven Forbes 09/17/2017,4:28 PM

## 2017-09-17 NOTE — Progress Notes (Signed)
PULMONARY / CRITICAL CARE MEDICINE   Name: Steven Forbes MRN: [id number] DOB: 02-Dec-1925    ADMISSION DATE:  08/25/2017 CONSULTATION DATE:  08/23/2017  REFERRING MD:  Dr. Ericka Pontiff   CHIEF COMPLAINT:  AMS   HISTORY OF PRESENT ILLNESS:   83 year old male with extensive PMH including dementia, DM, ESRD on HD M/W/F, GERD, Anemia, HLD, HTN, Aortic Stenosis, right transmetatarsal amputation, and known gangrenous right leg  Presents to ED from home with progressive lethargy. Per family patient was scheduled January 2 for AKA of right leg. Presented hypotensive, LA 7.49, given 2 L Bolus, Vancomycin/Zosyn and started on Levophed Gtt. PCCM called for admission.    Orthopedics was consulted. Agree with surgery 12/24. However, have discussed with family high risk due to multiple co-morbidites and current state of health.   SUBJECTIVE:  Continued low dose pressors, for MBS 12/30 afternoon. No HD 12/30. Remains pleasantly confused, baseline dementia  VITAL SIGNS: BP 111/77   Pulse 96   Temp 97.8 F (36.6 C) (Oral)   Resp (!) 22   Ht 5\' 5"  (1.651 m)   Wt 132 lb 0.9 oz (59.9 kg)   SpO2 100%   BMI 21.98 kg/m   HEMODYNAMICS:    VENTILATOR SETTINGS:    INTAKE / OUTPUT:  Intake/Output Summary (Last 24 hours) at 09/17/2017 1118 Last data filed at 09/17/2017 0700 Gross per 24 hour  Intake 500.11 ml  Output 1500 ml  Net -999.89 ml   PHYSICAL EXAMINATION: General: Chronically ill appearing male hypotensive on low dose pressors, awake and pleasantly confused Pulmonary: CTA bilaterally, crackles per bases Cardiac: RRR, Nl S1/S2 and -M/R/G Abdomen: Soft, NT, ND and +BS Extremities: Right AKA unremarkable, warm and dry otherwise  + diffuse anasarca, areas of darkening to fingers noted. Neuro/psych: Awake, interactive, moves extremities, confused at baseline, very hard of hearing.  LABS:  BMET Recent Labs  Lab 09/15/17 0930 09/16/17 0321 09/17/17 0343  NA 132* 133* 135  K 3.0*  3.3* 3.6  CL 94* 99* 98*  CO2 23 23 25   BUN 16 7 5*  CREATININE 2.89* 1.82* 1.47*  GLUCOSE 109* 192* 105*   Electrolytes Recent Labs  Lab 08/21/2017 0213  09/14/17 0011  09/15/17 0930 09/16/17 0321 09/17/17 0343  CALCIUM 7.1*   < > 6.5*   < > 6.2* 6.7* 6.9*  MG 1.6*  --   --   --   --  1.5*  --   PHOS 1.6*   < > 3.6  --  3.8 2.8  --    < > = values in this interval not displayed.   CBC Recent Labs  Lab 09/15/17 0930 09/16/17 0321 09/17/17 0343  WBC 29.2* 21.6* 20.3*  HGB 7.9* 7.7* 7.3*  HCT 23.5* 22.7* 22.0*  PLT 130* 121* 84*   Coag's No results for input(s): APTT, INR in the last 168 hours.  Sepsis Markers Recent Labs  Lab 08/26/2017 1936 09/05/2017 2215 09/02/2017 0213 09/12/17 0241 09/13/17 0500  LATICACIDVEN 7.33* 7.4* 5.0*  --   --   PROCALCITON  --   --   --  4.82 6.07   ABG Recent Labs  Lab 08/28/2017 1516 09/07/2017 1559 09/13/17 1138  PHART 7.518* 7.488* 7.384  PCO2ART 25.6* 28.6* 21.3*  PO2ART 436.0* 408.0* 86.5   Liver Enzymes Recent Labs  Lab 09/05/2017 1802  09/13/17 0900 09/14/17 0011 09/15/17 0930  AST 47*  --  87*  --   --   ALT 40  --  33  --   --  ALKPHOS 151*  --  129*  --   --   BILITOT 0.9  --  1.6*  --   --   ALBUMIN 1.2*   < > 1.2* 1.0* 1.0*   < > = values in this interval not displayed.   Cardiac Enzymes Recent Labs  Lab 09/07/2017 2215 09/13/2017 0213  TROPONINI 0.38* 0.36*   Glucose Recent Labs  Lab 09/16/17 1221 09/16/17 1633 09/16/17 2011 09/16/17 2356 09/17/17 0401 09/17/17 0821  GLUCAP 78 95 97 95 98 122*   Imaging Dg Chest Port 1 View  Result Date: 09/17/2017 CLINICAL DATA:  Respiratory failure EXAM: PORTABLE CHEST 1 VIEW COMPARISON:  09/18/2017 FINDINGS: Cardiac shadow is mildly enlarged but stable. Left jugular line, nasogastric catheter and dialysis catheter are again seen and stable. The endotracheal tube has been removed in the interval. Patchy infiltrates are identified bilaterally increased from the prior  exam. No bony abnormality is noted. IMPRESSION: Increasing patchy infiltrates bilaterally. Tubes and lines as described. Electronically Signed   By: Inez Catalina M.D.   On: 09/17/2017 07:32  I have reviewed the images personally  STUDIES:   CULTURES: Blood 12/23 >> Neg  ANTIBIOTICS: Vancomycin 12/23 >> Zosyn 12/23 >>   SIGNIFICANT EVENTS: 12/23 > Presents to ED  12/24 > Right Leg amputation  LINES/TUBES: ETT 12/24 > 12/26 Lt CVL IJ 12/24 > Lt femoral a line 12/24 >12/27  Resolved medical issues: Ventilator dependence  DISCUSSION: 83 year old male with known gangrenous right leg, seen by Dr. Sharol Given in office, planned above knee amputation January 2. Presents to ED septic  Underwent rt leg amputation 12/24; acid-base continues to worsen, he did not tolerate dialysis, remains in refractory shock/multiple organ failure.  He currently is awake I think he could protect his airway and if his minute ventilation is adequate this may be the only time we can extubate him and allow him to interact with his family.  I do not think he is going to survive this hospitalization, certainly he will not if he is no longer a candidate for dialysis.  ASSESSMENT / PLAN:  Septic Shock in setting of gangrenous right leg status post amputation 12/24 Demand Ischemia Tn1 0.36 Aortic Stenosis (EF on 03/17/17 50-55)  H/O HLD, HTN,  PVD S/P transmetatarsal amputation of right foot   Plan Restarted levophed overnight 12/28, Low dose levo with no escalation  Continue stress dose steroids, no taper given overnight events Continue telemetry monitoring Day # 7 Zosyn and vancomycin.  Culture data is all negative because of this we will discontinue antibiotics after  day #7, 12/30 Midodrine as ordered Pharmacy to adjust ABX dosing for HD  Lactic Acidosis in setting of septic shock  ESRD, HD M/W/F  Hypokalemia Acid base imbalance worse, did not tolerate dialysis 12/28.   Without dialysis he will not survive  this illness Acid-base improved with bicarbonate infusion Volume overload 12/29>> 1.5 L off with HD For HD again 12/31  Plan D/C bicarb drip 12/28 D5W ordered for hypoglycemia Daily renal profiles HD 12/29 and will need to discuss longterm plan with family. Correct K with HD today  GERD Protein Calorie Malnutrition For swallow eval due to aspiration event 12/27 Family continue to feed patient despite staff requesting wait until after swallow. They verbalize understanding that they are risking aspiration   Plan N.P.O. Given aspiration 12/27, will order SLP Clear liquid diet per speech For MBS 12/30 pm Once able to take diet then d/c D5W  Anemia of Chronic disease   -  Hemoglobin drifting down however suspect this is somewhat hemo-dilutional - Thrombocytopenia - Volume overloaded - 1.5 L off 12/29 with HD  Plan Trend CBC Transfuse as indicated with next HD Monitor for bleeding darbe SQ  per renal   DM    Hypoglycemia  Plan CBG Q 4 D5W for hypoglycemia Sliding scale insulin  Metabolic Encephalopathy  H/O Dementia  Confused at baseline P:   Supportive care As needed analgesia for surgical pain management, but minimize. More alert 12/30  FAMILY: Full DNR per previous conversations with family but pressors ok. We are not going to increase dose or escalate.  Will need to discuss with family realistic expectations given comorbidities and overall health. Family continue to feed patient despite pending swallow eval and understand they are risking aspiration pneumonia by doing so. ( Verbalized understanding). They do not have  realistic goals of care. Feel the patient has lived until 69 because of good care from family and " We have been at this for a long time and we know what we are doing". Hope to wean from levo and transfer within next 48 hours  DVT prophylaxis: [place] heparin SUP: ppi   Diet: NPO, swallow pending  Activity: bedrest Disposition : ICU   Magdalen Spatz, [place]. Pager: [phone].

## 2017-09-17 NOTE — Progress Notes (Addendum)
[facility] Progress Note  Subjective: per RN pt is coughing w/ liquids and suspect he is aspirating . Only 1.5 L off w HD yest due to soft BP's.   Vitals:   09/17/17 1100 09/17/17 1200 09/17/17 1223 09/17/17 1300  BP: (!) 112/54 102/62  (!) 111/55  Pulse:      Resp: (!) 21 (!) 22  19  Temp:   98.1 F (36.7 C)   TempSrc:   Oral   SpO2:      Weight:      Height:        Inpatient medications: . chlorhexidine gluconate (MEDLINE KIT)  15 mL Mouth Rinse BID  . heparin  5,000 Units Subcutaneous Q8H  . hydrocortisone sod succinate (SOLU-CORTEF) inj  50 mg Intravenous Q6H  . insulin aspart  2-6 Units Subcutaneous Q4H  . mouth rinse  15 mL Mouth Rinse QID  . midodrine  20 mg Oral BID WC  . pantoprazole (PROTONIX) IV  40 mg Intravenous Q24H   . sodium chloride 10 mL/hr at 09/15/17 0400  . sodium chloride    . sodium chloride    . dextrose 10 mL/hr at 09/16/17 1952  . norepinephrine (LEVOPHED) Adult infusion 7.467 mcg/min (09/17/17 1318)  . piperacillin-tazobactam (ZOSYN)  IV 3.375 g (09/17/17 0947)   Place/Maintain arterial line **AND** sodium chloride, sodium chloride, sodium chloride, alteplase, fentaNYL (SUBLIMAZE) injection, heparin, ipratropium-albuterol, lidocaine (PF), lidocaine-prilocaine, pentafluoroprop-tetrafluoroeth  Exam: Lying in 30 deg, no distress, talking but confused NO jvd Chest bilat rales 1/2 up post RRR tachy no RG Abd nondist, dec'd BS Ext diffuse 2-3+ LE/ UE edema R IJ TDC    Dialysis: [facility] MWF  4h   51.5kg  3K/2.25 bath  Hep 2500   R IJ cath 180NRe 400/auto 1.5   UFP 4 -Mircera 50 mcg IV q 2 weeks (last dose 08/31/17 Last HGB 9.0 09/06/17)      Impression: 1. Sepsis/ gangrenous leg - sp R AKA 12/24 per ortho 2. Shock / hypotension - still on levo gtt 3. ESRD - usual HD is MWF.  Not CRRT candidate. Needs sig vol off. Have been doing almost daily short HD sessions, 3hrs w/ UF as tol. Plan HD tomorrow.  4. Volume overload - up 8-9kg  w/ anasarca. 5. Resp - may be aspirating food.  CXR w/ pathcy infiltrates, edema vs food  6. Anemia of CKD  - HGB 8- 9 range. Give darbe SQ   7. Metabolic bone disease -  No binders/VDRA. Ca 7 C Ca 8.9 8. DM: per primary 9. DNR 10. Dementia - at baseline   Plan - as above. Poor prognosis.    Kelly Splinter MD [facility] pager [phone]   09/17/2017, 2:45 PM   Recent Labs  Lab 09/14/17 0011  09/15/17 0930 09/16/17 0321 09/17/17 0343  NA 132*   < > 132* 133* 135  K 3.0*   < > 3.0* 3.3* 3.6  CL 94*   < > 94* 99* 98*  CO2 24   < > _0 GLUCOSE 87   < > 109* 192* 105*  BUN 14   < > 16 7 5*  CREATININE 2.79*   < > 2.89* 1.82* 1.47*  CALCIUM 6.5*   < > 6.2* 6.7* 6.9*  PHOS 3.6  --  3.8 2.8  --    < > = values in this interval not displayed.   Recent Labs  Lab 09/03/2017 1802  09/13/17 0900 09/14/17 0011  09/15/17 0930  AST 47*  --  87*  --   --   ALT 40  --  33  --   --   ALKPHOS 151*  --  129*  --   --   BILITOT 0.9  --  1.6*  --   --   PROT 4.5*  --  4.4*  --   --   ALBUMIN 1.2*   < > 1.2* 1.0* 1.0*   < > = values in this interval not displayed.   Recent Labs  Lab 09/07/2017 1802  09/15/17 0930 09/16/17 0321 09/17/17 0343  WBC 20.9*   < > 29.2* 21.6* 20.3*  NEUTROABS 18.2*  --   --   --   --   HGB 8.5*   < > 7.9* 7.7* 7.3*  HCT 26.5*   < > 23.5* 22.7* 22.0*  MCV 94.0   < > 88.3 87.6 89.1  PLT 174   < > 130* 121* 84*   < > = values in this interval not displayed.   Iron/TIBC/Ferritin/ %Sat    Component Value Date/Time   IRON 31 (L) 05/27/2011 0757   TIBC 170 (L) 05/27/2011 0757   FERRITIN 840 (H) 05/27/2011 0757   IRONPCTSAT 18 (L) 05/27/2011 1675

## 2017-09-18 ENCOUNTER — Inpatient Hospital Stay (HOSPITAL_COMMUNITY): Payer: Medicare Other

## 2017-09-18 DIAGNOSIS — I361 Nonrheumatic tricuspid (valve) insufficiency: Secondary | ICD-10-CM

## 2017-09-18 LAB — GLUCOSE, CAPILLARY
GLUCOSE-CAPILLARY: 108 mg/dL — AB (ref 65–99)
GLUCOSE-CAPILLARY: 122 mg/dL — AB (ref 65–99)
GLUCOSE-CAPILLARY: 135 mg/dL — AB (ref 65–99)
GLUCOSE-CAPILLARY: 175 mg/dL — AB (ref 65–99)
Glucose-Capillary: 18 mg/dL — CL (ref 65–99)
Glucose-Capillary: 132 mg/dL — ABNORMAL HIGH (ref 65–99)
Glucose-Capillary: 109 mg/dL — ABNORMAL HIGH (ref 65–99)
Glucose-Capillary: 141 mg/dL — ABNORMAL HIGH (ref 65–99)

## 2017-09-18 LAB — COOXEMETRY PANEL
CARBOXYHEMOGLOBIN: 1.8 % — AB (ref 0.5–1.5)
METHEMOGLOBIN: 0.8 % (ref 0.0–1.5)
O2 Saturation: 64.1 %
TOTAL HEMOGLOBIN: 7.8 g/dL — AB (ref 12.0–16.0)

## 2017-09-18 LAB — PROCALCITONIN: PROCALCITONIN: 2.96 ng/mL

## 2017-09-18 LAB — CBC
HCT: 22.6 % — ABNORMAL LOW (ref 39.0–52.0)
Hemoglobin: 7.6 g/dL — ABNORMAL LOW (ref 13.0–17.0)
MCH: 30 pg (ref 26.0–34.0)
MCHC: 33.6 g/dL (ref 30.0–36.0)
MCV: 89.3 fL (ref 78.0–100.0)
PLATELETS: 122 10*3/uL — AB (ref 150–400)
RBC: 2.53 MIL/uL — AB (ref 4.22–5.81)
RDW: 22 % — AB (ref 11.5–15.5)
WBC: 25.8 10*3/uL — AB (ref 4.0–10.5)

## 2017-09-18 LAB — BASIC METABOLIC PANEL
ANION GAP: 14 (ref 5–15)
BUN: 9 mg/dL (ref 6–20)
CALCIUM: 7.1 mg/dL — AB (ref 8.9–10.3)
CO2: 24 mmol/L (ref 22–32)
Chloride: 97 mmol/L — ABNORMAL LOW (ref 101–111)
Creatinine, Ser: 2.25 mg/dL — ABNORMAL HIGH (ref 0.61–1.24)
GFR, EST AFRICAN AMERICAN: 28 mL/min — AB (ref >60)
GFR, EST NON AFRICAN AMERICAN: 24 mL/min — AB (ref >60)
Glucose, Bld: 123 mg/dL — ABNORMAL HIGH (ref 65–99)
POTASSIUM: 3.7 mmol/L (ref 3.5–5.1)
SODIUM: 135 mmol/L (ref 135–145)

## 2017-09-18 LAB — ECHOCARDIOGRAM COMPLETE
Height: 65 in
WEIGHTICAEL: 2088.2 [oz_av]

## 2017-09-18 LAB — LACTIC ACID, PLASMA: Lactic Acid, Venous: 1.9 mmol/L (ref 0.5–1.9)

## 2017-09-18 MED ORDER — MIDODRINE HCL 5 MG PO TABS
20.0000 mg | ORAL_TABLET | Freq: Three times a day (TID) | ORAL | Status: DC
  Administered 2017-09-18 – 2017-09-19 (×3): 20 mg via ORAL
  Filled 2017-09-18 (×5): qty 4

## 2017-09-18 MED ORDER — FENTANYL CITRATE (PF) 100 MCG/2ML IJ SOLN
25.0000 ug | Freq: Once | INTRAMUSCULAR | Status: AC
  Administered 2017-09-18: 25 ug via INTRAVENOUS
  Filled 2017-09-18: qty 2

## 2017-09-18 MED ORDER — ACETAMINOPHEN 160 MG/5ML PO SOLN
650.0000 mg | Freq: Four times a day (QID) | ORAL | Status: DC | PRN
  Administered 2017-09-18 – 2017-09-19 (×2): 650 mg
  Filled 2017-09-18 (×2): qty 20.3

## 2017-09-18 MED ORDER — DEXTROSE 5 % IV SOLN
1.0000 g | INTRAVENOUS | Status: DC
  Administered 2017-09-18 – 2017-09-19 (×2): 1 g via INTRAVENOUS
  Filled 2017-09-18 (×2): qty 1

## 2017-09-18 NOTE — Progress Notes (Signed)
*  PRELIMINARY RESULTS* Echocardiogram 2D Echocardiogram has been performed.  Steven Forbes 09/18/2017, 1:27 PM

## 2017-09-18 NOTE — Progress Notes (Signed)
Patient ID: Steven Forbes, male   DOB: 18-Jan-1926, 83 y.o.   MRN: [id number]  [facility] Progress Note    Subjective:   Nonverbal   Objective:   BP (!) 92/45   Pulse 88   Temp 98.4 F (36.9 C) (Oral)   Resp 17   Ht '5\' 5"'$  (1.651 m)   Wt 59.9 kg (132 lb 0.9 oz)   SpO2 95%   BMI 21.98 kg/m   Intake/Output: I/O last 3 completed shifts: In: 747.9 [I.V.:577.9; NG/GT:20; IV Piggyback:150] Out: 0    Intake/Output this shift:  Total I/O In: 84.5 [I.V.:34.5; NG/GT:50] Out: -  Weight change: 3.8 kg (8 lb 6 oz)  Physical Exam: Gen: cachectic in NAD CVS: no rub Resp: cta Abd: benign Ext: s/p RAKA, left leg with 1+ edema  Labs: BMET Recent Labs  Lab 09/12/17 0240 09/13/17 0900 09/14/17 0011 09/14/17 0326 09/15/17 0930 09/16/17 0321 09/17/17 0343 09/18/17 0433  NA 132* 132* 132* 132* 132* 133* 135 135  K 3.5 4.4 3.0* 3.0* 3.0* 3.3* 3.6 3.7  CL 100* 101 94* 99* 94* 99* 98* 97*  CO2 22 12* 24 21* '23 23 25 24  '$ GLUCOSE 107* 104* 87 110* 109* 192* 105* 123*  BUN '8 9 14 12 16 7 '$ 5* 9  CREATININE 2.20* 2.25* 2.79* 2.44* 2.89* 1.82* 1.47* 2.25*  ALBUMIN 1.5* 1.2* 1.0*  --  1.0*  --   --   --   CALCIUM 7.0* 6.6* 6.5* 6.2* 6.2* 6.7* 6.9* 7.1*  PHOS 3.2  --  3.6  --  3.8 2.8  --   --    CBC Recent Labs  Lab 09/15/17 0930 09/16/17 0321 09/17/17 0343 09/18/17 0433  WBC 29.2* 21.6* 20.3* 25.8*  HGB 7.9* 7.7* 7.3* 7.6*  HCT 23.5* 22.7* 22.0* 22.6*  MCV 88.3 87.6 89.1 89.3  PLT 130* 121* 84* 122*    '@IMGRELPRIORS'$ @ Medications:    . chlorhexidine gluconate (MEDLINE KIT)  15 mL Mouth Rinse BID  . heparin  5,000 Units Subcutaneous Q8H  . hydrocortisone sod succinate (SOLU-CORTEF) inj  50 mg Intravenous Q6H  . insulin aspart  2-6 Units Subcutaneous Q4H  . mouth rinse  15 mL Mouth Rinse QID  . midodrine  20 mg Oral TID WC  . pantoprazole (PROTONIX) IV  40 mg Intravenous Q24H    Dialysis: [facility] MWF 4h 51.5kg 3K/2.25 bath Hep 2500 R IJ  cath 180NRe 400/auto 1.5 UFP 4 -Mircera 50 mcg IV q 2 weeks (last dose 08/31/17 Last HGB 9.0 09/06/17)  Assessment/ Plan:   1. Sepsis- from gangrenous right leg s/p AKA 08/29/2017 2. Shock and ongoing hypotension- possibly cardiogenic vs aspiration pneumonia 3. ESRD- s/p HD today but only able to UF 700cc due to hypotension 4. Anemia: ESA 5. CKD-MBD:  6. Nutrition:  7. Disposition- poor prognosis and has been having failure to thrive for the last year with progressive decline in mental and physical functional status.  Agree with Palliative care consult today.  Donetta Potts, MD [place] Pager [phone] 09/18/2017, 10:43 AM

## 2017-09-18 NOTE — Progress Notes (Signed)
[facility] P[facility]  Met with multiple family members and explained that the situation is not improving and that he remains levophed dependent and now unable to tolerate dialysis.  I explained that life support at this point is not providing medical benefit (levophed) as his condition is no improving.  They plan to withdraw care on 1/1.  Roselie Awkward, MD [facility] Pager: [phone] Cell: [phone] After 3pm or if no response, call [phone]

## 2017-09-18 NOTE — Progress Notes (Signed)
Patient ID: Steven Forbes, male   DOB: 1926/09/13, 83 y.o.   MRN: [id number]  This NP visited patient at the bedside as a follow up for palliative  medicine needs and emotional support.  Discussed current medical situation with critical care physician  Dr Lake Bells.  Plan is to meet with family today at 3:00 to discuss goals of care.  23 Mother and 4 daughters present for meeting Continued conversation regarding  goals of care and subsequent specific to diagnosis, prognosis, limitations of medical interventions, goals of care important to the family for this patient at this time in this situation, were discussed in detail.  Revisited his multiple co-morbi ties and high mortality.    All family members expressed an understanding that the patient is approaching end of life.  They hope for him to survive today and live into the new year.  They "don't want him to suffer"  They agreed to descalate care, and shift to a full comfort path tomorrow when they gather at noon.  Questions and concerns addressed  PMT will continue to support holistically and family is encouraged to call with questions or concerns.  Discussed with Dr Lake Bells and nursing   Time in  1600        Time out  1700      Total time spent on the unit was  Minutes 60 minutes  Greater than 50% of the time was spent in counseling and coordination of care  Wadie Lessen NP  Palliative Medicine Team Team Phone # [phone] Pager [phone]

## 2017-09-18 NOTE — Progress Notes (Signed)
[facility] Progress Note Patient Name: Steven Forbes DOB: 1926/01/26 MRN: [id number]   Date of Service  09/18/2017  HPI/Events of Note  Pain - No relief with Tylenol. Creatinine = 2.25, therefore, reluctant to use NSAIDs.   eICU Interventions  Will order: 1. Fentanyl 25 mcg IV X 1 now.      Intervention Category Major Interventions: Other:  Lysle Dingwall 09/18/2017, 9:17 PM

## 2017-09-18 NOTE — Progress Notes (Signed)
PULMONARY / CRITICAL CARE MEDICINE   Name: Steven Forbes MRN: [id number] DOB: November 14, 1925    ADMISSION DATE:  08/28/2017 CONSULTATION DATE:  08/29/2017  REFERRING MD:  Dr. Ericka Pontiff   CHIEF COMPLAINT:  AMS   HISTORY OF PRESENT ILLNESS:   83 year old male with extensive PMH including dementia, DM, ESRD on HD M/W/F, GERD, Anemia, HLD, HTN, Aortic Stenosis, right transmetatarsal amputation, and known gangrenous right leg  Presents to ED from home with progressive lethargy. Per family patient was scheduled January 2 for AKA of right leg. Presented hypotensive, LA 7.49, given 2 L Bolus, Vancomycin/Zosyn and started on Levophed Gtt. PCCM called for admission.    Orthopedics was consulted. Agree with surgery 12/24. However, have discussed with family high risk due to multiple co-morbidites and current state of health.   SUBJECTIVE:  MBS yesterday: not awake enough to swallow  VITAL SIGNS: BP (!) 76/44   Pulse 98   Temp 98.4 F (36.9 C) (Oral)   Resp 16   Ht 5' 5"  (1.651 m)   Wt 59.9 kg (132 lb 0.9 oz)   SpO2 95%   BMI 21.98 kg/m   HEMODYNAMICS:    VENTILATOR SETTINGS:    INTAKE / OUTPUT:  Intake/Output Summary (Last 24 hours) at 09/18/2017 0955 Last data filed at 09/18/2017 0900 Gross per 24 hour  Intake 512.5 ml  Output 0 ml  Net 512.5 ml   PHYSICAL EXAMINATION:  General:  Resting comfortably in bed HENT: NCAT OP clear PULM: crackles bases CV: RRR, no mgr GI: BS+, soft, nontender MSK: s/p R BKA Neuro: sleepy, stirs to touch   LABS:  BMET Recent Labs  Lab 09/16/17 0321 09/17/17 0343 09/18/17 0433  NA 133* 135 135  K 3.3* 3.6 3.7  CL 99* 98* 97*  CO2 23 25 24   BUN 7 5* 9  CREATININE 1.82* 1.47* 2.25*  GLUCOSE 192* 105* 123*   Electrolytes Recent Labs  Lab 09/14/17 0011  09/15/17 0930 09/16/17 0321 09/17/17 0343 09/18/17 0433  CALCIUM 6.5*   < > 6.2* 6.7* 6.9* 7.1*  MG  --   --   --  1.5*  --   --   PHOS 3.6  --  3.8 2.8  --   --    < > =  values in this interval not displayed.   CBC Recent Labs  Lab 09/16/17 0321 09/17/17 0343 09/18/17 0433  WBC 21.6* 20.3* 25.8*  HGB 7.7* 7.3* 7.6*  HCT 22.7* 22.0* 22.6*  PLT 121* 84* 122*   Coag's No results for input(s): APTT, INR in the last 168 hours.  Sepsis Markers Recent Labs  Lab 09/12/17 0241 09/13/17 0500  PROCALCITON 4.82 6.07   ABG Recent Labs  Lab 09/08/2017 1516 09/13/2017 1559 09/13/17 1138  PHART 7.518* 7.488* 7.384  PCO2ART 25.6* 28.6* 21.3*  PO2ART 436.0* 408.0* 86.5   Liver Enzymes Recent Labs  Lab 09/13/17 0900 09/14/17 0011 09/15/17 0930  AST 87*  --   --   ALT 33  --   --   ALKPHOS 129*  --   --   BILITOT 1.6*  --   --   ALBUMIN 1.2* 1.0* 1.0*   Cardiac Enzymes No results for input(s): TROPONINI, PROBNP in the last 168 hours. Glucose Recent Labs  Lab 09/17/17 1304 09/17/17 1641 09/17/17 1956 09/18/17 0011 09/18/17 0351 09/18/17 0754  GLUCAP 124* 68 98 108* 122* 132*   Imaging Dg Chest Port 1 View  Result Date: 09/18/2017 CLINICAL DATA:  Respiratory  failure. EXAM: PORTABLE CHEST 1 VIEW COMPARISON:  09/17/2017 FINDINGS: Left jugular catheter terminates near the cavoatrial junction. Right jugular dialysis catheter terminates over the right atrium. Enteric tube courses into the left upper abdomen with tip not imaged. The cardiac silhouette remains enlarged. Patchy bilateral airspace opacities are noted in the mid and lower lungs bilaterally, greatest in the right lung base, and have mildly worsened. There is a small right pleural effusion which may have also mildly enlarged. No pneumothorax is identified. Aortic atherosclerosis is noted. IMPRESSION: Mildly worsening airspace disease bilaterally. Small right pleural effusion. Electronically Signed   By: Logan Bores M.D.   On: 09/18/2017 06:37   Dg Swallowing Func-speech Pathology  Result Date: 09/17/2017 Objective Swallowing Evaluation: Type of Study: MBS-Modified Barium Swallow Study   Patient Details Name: Steven Forbes MRN: [id number] Date of Birth: 1926-02-01 Today's Date: 09/17/2017 Time: SLP Start Time (ACUTE ONLY): 1540 -SLP Stop Time (ACUTE ONLY): 1613 SLP Time Calculation (min) (ACUTE ONLY): 33 min Past Medical History: Past Medical History: Diagnosis Date . Anemia  . Asthma  . Dementia  . Diabetes mellitus   type 2 . ESRD (end stage renal disease) ([place])   Dialysis on MONDAY, [place] and FRIDAYS . Flu 08/2014 . GERD (gastroesophageal reflux disease)  . History of blood transfusion  . HOH (hard of hearing)  . Hx of echocardiogram   a.  Echocardiogram (09/10/2013): EF 50-55%, normal wall motion, grade 2 diastolic dysfunction, mild aortic stenosis, trivial AI, MAC, moderate MR, mild LAE, moderate to severe TR, moderately increased PASP. [name] Hx of non-ST elevation myocardial infarction (NSTEMI)   a. in setting of influenza, volume overload 08/2013 => Lexiscan Myoview (09/11/2013): Subtle ischemia in the anteroseptal wall, EF 44%. => med Rx recommended . Hyperlipidemia  . Hypertension  . Shortness of breath   with exertion Past Surgical History: Past Surgical History: Procedure Laterality Date . AMPUTATION Right 08/19/2017  Procedure: AMPUTATION ABOVE KNEE;  Surgeon: Mcarthur Rossetti, MD;  Location: [facility];  Service: Orthopedics;  Laterality: Right; . ANTERIOR APPROACH HEMI HIP ARTHROPLASTY Left 03/18/2017  Procedure: ANTERIOR APPROACH HEMI HIP ARTHROPLASTY LEFT;  Surgeon: Leandrew Koyanagi, MD;  Location: [facility];  Service: Orthopedics;  Laterality: Left; . AV FISTULA PLACEMENT  2008  Left Upper Arm . COLONOSCOPY   . EYE SURGERY Bilateral   cataract surgery . LAPAROSCOPIC GASTROTOMY W/ REPAIR OF ULCER   . REVISON OF ARTERIOVENOUS FISTULA Left 11/21/2013  Procedure: REVISION OF ARTERIOVENOUS FISTULA;  Surgeon: Angelia Mould, MD;  Location: [facility];  Service: Vascular;  Laterality: Left; . REVISON OF ARTERIOVENOUS FISTULA Left 02/18/2016  Procedure: excision of ulcerated skin over left  brachio-cephalic AV fistula x 2;  Surgeon: [name] Misty, MD;  Location: [facility];  Service: Vascular;  Laterality: Left; . TOE AMPUTATION Right   all toes HPI: 83 year old male with extensive PMH including dementia, DM, ESRD on HD M/W/F, GERD, Anemia, HLD, HTN, Aortic Stenosis, right transmetatarsal amputation, and known gangrenous right leg. s/p right leg amputation 12/24, intubated and extubated 12/26. Evaluated by SLP 01/2017 and noted to have a primarily cognitive based dysphagia with recommendations for dysphagia 1 with thin liquid.  No Data Recorded Assessment / Plan / Recommendation CHL IP CLINICAL IMPRESSIONS 09/17/2017 Clinical Impression Patient presents with a severe oropharyngeal dysphagia with cognitive and mechanical deficits. Cognitively, patient with extremely poor sustained attention to to task and poor awareness resulting in severe oral holding, prolonged and often absent oral transit of bolus, speaking with bolus  in oral cavity with partial bolus eventually spilling to the vallecula prior to intiating a swallow. Pharyngeally, patient with muscle weakness resulting in severe residuals post swallow with eventual penetration to the vocal cords without response. Unable to visualize below the cords during today's study despite multiple attempts at repositioning but strongly suspect aspiration. At this time, all po consistencies hold high risk of aspiration as cognitively, patient unable to follow directions for attempts at improved airway protection. Note that NG tube remained in place for todays study as RN concerned that patient would not allow replacement if taken out. Do not feel that swallowing function would be significantly improved with NG tuibe removal based on todays study. Recommend palliative care re-visit with patient and family to assist in decision making should family wish to consider po feeds. May consider dysphagia 1, thin for comfort with known risk of aspiration. SLP will f/u for  education. Family not present after study today.  SLP Visit Diagnosis Dysphagia, oropharyngeal phase (R13.12) Attention and concentration deficit following -- Frontal lobe and executive function deficit following -- Impact on safety and function Severe aspiration risk   CHL IP TREATMENT RECOMMENDATION 09/17/2017 Treatment Recommendations Therapy as outlined in treatment plan below   Prognosis 09/17/2017 Prognosis for Safe Diet Advancement Guarded Barriers to Reach Goals Severity of deficits;Cognitive deficits Barriers/Prognosis Comment -- CHL IP DIET RECOMMENDATION 09/17/2017 SLP Diet Recommendations NPO Liquid Administration via -- Medication Administration Via alternative means Compensations -- Postural Changes --   CHL IP OTHER RECOMMENDATIONS 09/17/2017 Recommended Consults -- Oral Care Recommendations Oral care QID Other Recommendations --   CHL IP FOLLOW UP RECOMMENDATIONS 09/17/2017 Follow up Recommendations None   CHL IP FREQUENCY AND DURATION 09/17/2017 Speech Therapy Frequency (ACUTE ONLY) min 2x/week Treatment Duration 1 week      CHL IP ORAL PHASE 09/17/2017 Oral Phase Impaired Oral - Pudding Teaspoon -- Oral - Pudding Cup -- Oral - Honey Teaspoon -- Oral - Honey Cup -- Oral - Nectar Teaspoon Reduced posterior propulsion;Weak lingual manipulation;Holding of bolus;Lingual/palatal residue;Piecemeal swallowing;Delayed oral transit;Decreased bolus cohesion;Left anterior bolus loss;Right anterior bolus loss;Impaired mastication Oral - Nectar Cup Reduced posterior propulsion;Weak lingual manipulation;Holding of bolus;Lingual/palatal residue;Piecemeal swallowing;Delayed oral transit;Decreased bolus cohesion;Left anterior bolus loss;Right anterior bolus loss;Impaired mastication Oral - Nectar Straw -- Oral - Thin Teaspoon Reduced posterior propulsion;Weak lingual manipulation;Holding of bolus;Lingual/palatal residue;Piecemeal swallowing;Delayed oral transit;Decreased bolus cohesion;Left anterior bolus loss;Right  anterior bolus loss;Impaired mastication Oral - Thin Cup Reduced posterior propulsion;Weak lingual manipulation;Holding of bolus;Lingual/palatal residue;Piecemeal swallowing;Delayed oral transit;Decreased bolus cohesion;Left anterior bolus loss;Right anterior bolus loss;Impaired mastication Oral - Thin Straw -- Oral - Puree Reduced posterior propulsion;Weak lingual manipulation;Holding of bolus;Lingual/palatal residue;Piecemeal swallowing;Delayed oral transit;Decreased bolus cohesion;Impaired mastication Oral - Mech Soft -- Oral - Regular -- Oral - Multi-Consistency -- Oral - Pill -- Oral Phase - Comment --  CHL IP PHARYNGEAL PHASE 09/17/2017 Pharyngeal Phase Impaired Pharyngeal- Pudding Teaspoon -- Pharyngeal -- Pharyngeal- Pudding Cup -- Pharyngeal -- Pharyngeal- Honey Teaspoon -- Pharyngeal -- Pharyngeal- Honey Cup -- Pharyngeal -- Pharyngeal- Nectar Teaspoon Delayed swallow initiation-vallecula;Reduced epiglottic inversion;Reduced pharyngeal peristalsis;Reduced anterior laryngeal mobility;Reduced laryngeal elevation;Reduced airway/laryngeal closure;Reduced tongue base retraction;Penetration/Aspiration during swallow;Penetration/Apiration after swallow Pharyngeal Material enters airway, CONTACTS cords and not ejected out Pharyngeal- Nectar Cup Delayed swallow initiation-vallecula;Reduced epiglottic inversion;Reduced pharyngeal peristalsis;Reduced anterior laryngeal mobility;Reduced laryngeal elevation;Reduced airway/laryngeal closure;Reduced tongue base retraction;Penetration/Aspiration during swallow;Penetration/Apiration after swallow Pharyngeal Material enters airway, CONTACTS cords and not ejected out Pharyngeal- Nectar Straw -- Pharyngeal -- Pharyngeal- Thin Teaspoon Delayed swallow initiation-vallecula;Reduced epiglottic inversion;Reduced pharyngeal peristalsis;Reduced anterior laryngeal  mobility;Reduced laryngeal elevation;Reduced airway/laryngeal closure;Reduced tongue base  retraction;Penetration/Aspiration during swallow;Penetration/Apiration after swallow Pharyngeal Material enters airway, CONTACTS cords and not ejected out Pharyngeal- Thin Cup Delayed swallow initiation-vallecula;Reduced epiglottic inversion;Reduced pharyngeal peristalsis;Reduced anterior laryngeal mobility;Reduced laryngeal elevation;Reduced airway/laryngeal closure;Reduced tongue base retraction;Penetration/Aspiration during swallow;Penetration/Apiration after swallow Pharyngeal Material enters airway, CONTACTS cords and not ejected out Pharyngeal- Thin Straw -- Pharyngeal -- Pharyngeal- Puree Delayed swallow initiation-vallecula;Reduced epiglottic inversion;Reduced pharyngeal peristalsis;Reduced anterior laryngeal mobility;Reduced laryngeal elevation;Reduced airway/laryngeal closure;Reduced tongue base retraction Pharyngeal -- Pharyngeal- Mechanical Soft -- Pharyngeal -- Pharyngeal- Regular -- Pharyngeal -- Pharyngeal- Multi-consistency -- Pharyngeal -- Pharyngeal- Pill -- Pharyngeal -- Pharyngeal Comment --  Gabriel Rainwater MA, CCC-SLP [phone] [place] Leah Meryl 09/17/2017, 4:29 PM             I have reviewed the images personally  STUDIES:   CULTURES: Blood 12/23 >> Neg  ANTIBIOTICS: Vancomycin 12/23 >> Zosyn 12/23 >>   SIGNIFICANT EVENTS: 12/23 > Presents to ED  12/24 > Right Leg amputation  LINES/TUBES: ETT 12/24 > 12/26 Lt CVL IJ 12/24 > Lt femoral a line 12/24 >12/27  Resolved medical issues: Ventilator dependence  DISCUSSION: 83 year old male with known gangrenous right leg, seen by Dr. Sharol Given in office, planned above knee amputation January 2. Presents to ED septic  Underwent rt leg amputation 12/24; acid-base continues to worsen, he did not tolerate dialysis, remains in refractory shock/multiple organ failure.  He currently is awake I think he could protect his airway and if his minute ventilation is adequate this may be the only time we can extubate him and allow him to interact  with his family.  I do not think he is going to survive this hospitalization, certainly he will not if he is no longer a candidate for dialysis.  ASSESSMENT / PLAN:  CV Septic Shock in setting of gangrenous right leg status post amputation 12/24 > improved but now with persistent hypotension: ddx septic shock vs cardiogenic shock Demand Ischemia Tn1 0.36 Aortic Stenosis (EF on 03/17/17 50-55)  > appears to have cardiogenic shock based on exam H/O HLD, HTN,  PVD S/P transmetatarsal amputation of right foot   Plan Continue stress dose steroids Titrate levophed for MAP > 65 Increase midodrine to 48m tid Repeat echocardiogram today Check coox, procalcitonin, lactic acid  Renal ESRD, HD M/W/F  Hypokalemia Acid base imbalance worse, did not tolerate dialysis 12/28.   Without dialysis he will not survive this illness Acid-base improved with bicarbonate infusion Volume overload 12/29>> 1.5 L off with HD For HD again 12/31  Plan HD per renal Monitor BMET and UOP Replace electrolytes as needed  GO GERD Protein Calorie Malnutrition Mental status barrier to aspiration risk (and NG tube)  Plan NPO for now Will have family conversation regarding goals of care  Heme Anemia of Chronic disease     Plan Monitor for bleeding Transfuse for Hgb < 7gm/dL  ID: Aspiration pneumonia P Restart antibiotics  Pulm: A: bilateral pulmonary infiltrates, likely pulm edmea and aspiration pneumonia P: Aspiration precautions NPO Restart antibiotics Repeat Echo Volume remove as able  Endo DM    Hypoglycemia  Plan CBG q4 with SSI  Neuro: Metabolic Encephalopathy  H/O Dementia  Confused at baseline P:   Minimize sedation D/c fentanyl   FAMILY: Full DNR per conversation with family; will revisit this today with entire family because his progress has been slow and his overall condition is critical; prognosis is guarded given advanced age  DVT prophylaxis: [place] heparin SUP: ppi  Diet: NPO, swallow pending  Activity: bedrest Disposition : ICU  Niece updated bedside by me 12/31, I asked her to gather all involved family today  My cc time 68 minutes  Roselie Awkward, MD [place] P[facility]M Pager: [phone] Cell: [phone] After 3pm or if no response, call [phone]

## 2017-09-19 DIAGNOSIS — R52 Pain, unspecified: Secondary | ICD-10-CM

## 2017-09-19 DIAGNOSIS — R0600 Dyspnea, unspecified: Secondary | ICD-10-CM

## 2017-09-19 DIAGNOSIS — I959 Hypotension, unspecified: Secondary | ICD-10-CM

## 2017-09-19 LAB — GLUCOSE, CAPILLARY
GLUCOSE-CAPILLARY: 96 mg/dL (ref 65–99)
Glucose-Capillary: 127 mg/dL — ABNORMAL HIGH (ref 65–99)
Glucose-Capillary: 137 mg/dL — ABNORMAL HIGH (ref 65–99)

## 2017-09-19 LAB — CBC WITH DIFFERENTIAL/PLATELET
BASOS PCT: 0 %
Basophils Absolute: 0 10*3/uL (ref 0.0–0.1)
Eosinophils Absolute: 0 10*3/uL (ref 0.0–0.7)
Eosinophils Relative: 0 %
HEMATOCRIT: 25.3 % — AB (ref 39.0–52.0)
HEMOGLOBIN: 8.3 g/dL — AB (ref 13.0–17.0)
LYMPHS ABS: 1.5 10*3/uL (ref 0.7–4.0)
LYMPHS PCT: 5 %
MCH: 30 pg (ref 26.0–34.0)
MCHC: 32.8 g/dL (ref 30.0–36.0)
MCV: 91.3 fL (ref 78.0–100.0)
MONOS PCT: 3 %
Monocytes Absolute: 0.9 10*3/uL (ref 0.1–1.0)
NEUTROS ABS: 27.4 10*3/uL — AB (ref 1.7–7.7)
Neutrophils Relative %: 92 %
Platelets: 102 10*3/uL — ABNORMAL LOW (ref 150–400)
RBC: 2.77 MIL/uL — ABNORMAL LOW (ref 4.22–5.81)
RDW: 22.4 % — ABNORMAL HIGH (ref 11.5–15.5)
WBC: 29.8 10*3/uL — ABNORMAL HIGH (ref 4.0–10.5)

## 2017-09-19 LAB — BASIC METABOLIC PANEL
ANION GAP: 15 (ref 5–15)
BUN: 7 mg/dL (ref 6–20)
CALCIUM: 7 mg/dL — AB (ref 8.9–10.3)
CHLORIDE: 97 mmol/L — AB (ref 101–111)
CO2: 23 mmol/L (ref 22–32)
Creatinine, Ser: 1.92 mg/dL — ABNORMAL HIGH (ref 0.61–1.24)
GFR calc non Af Amer: 29 mL/min — ABNORMAL LOW (ref >60)
GFR, EST AFRICAN AMERICAN: 34 mL/min — AB (ref >60)
Glucose, Bld: 147 mg/dL — ABNORMAL HIGH (ref 65–99)
POTASSIUM: 3.7 mmol/L (ref 3.5–5.1)
Sodium: 135 mmol/L (ref 135–145)

## 2017-09-19 LAB — PROCALCITONIN: Procalcitonin: 3.19 ng/mL

## 2017-09-19 LAB — MAGNESIUM: Magnesium: 1.6 mg/dL — ABNORMAL LOW (ref 1.7–2.4)

## 2017-09-19 MED ORDER — AMIODARONE HCL IN DEXTROSE 360-4.14 MG/200ML-% IV SOLN
INTRAVENOUS | Status: AC
  Administered 2017-09-19: 150 mg via INTRAVENOUS
  Filled 2017-09-19: qty 200

## 2017-09-19 MED ORDER — SODIUM CHLORIDE 0.9 % IV SOLN: 100.0000 mL | INTRAVENOUS | Status: DC | PRN

## 2017-09-19 MED ORDER — HEPARIN SODIUM (PORCINE) 1000 UNIT/ML DIALYSIS
2500.0000 [IU] | Freq: Once | INTRAMUSCULAR | Status: DC
  Filled 2017-09-19: qty 3

## 2017-09-19 MED ORDER — LIDOCAINE HCL (PF) 1 % IJ SOLN: 5.0000 mL | INTRAMUSCULAR | Status: DC | PRN

## 2017-09-19 MED ORDER — AMIODARONE HCL IN DEXTROSE 360-4.14 MG/200ML-% IV SOLN
30.0000 mg/h | INTRAVENOUS | Status: DC
  Filled 2017-09-19: qty 200

## 2017-09-19 MED ORDER — FENTANYL CITRATE (PF) 100 MCG/2ML IJ SOLN
25.0000 ug | INTRAMUSCULAR | Status: DC | PRN
  Administered 2017-09-19 (×3): 25 ug via INTRAVENOUS
  Filled 2017-09-19 (×4): qty 2

## 2017-09-19 MED ORDER — LIDOCAINE-PRILOCAINE 2.5-2.5 % EX CREA
1.0000 "application " | TOPICAL_CREAM | CUTANEOUS | Status: DC | PRN
  Filled 2017-09-19: qty 5

## 2017-09-19 MED ORDER — AMIODARONE LOAD VIA INFUSION
150.0000 mg | Freq: Once | INTRAVENOUS | Status: AC
  Administered 2017-09-19: 150 mg via INTRAVENOUS
  Filled 2017-09-19: qty 83.34

## 2017-09-19 MED ORDER — ALTEPLASE 2 MG IJ SOLR: 2.0000 mg | Freq: Once | INTRAMUSCULAR | Status: DC | PRN

## 2017-09-19 MED ORDER — AMIODARONE HCL IN DEXTROSE 360-4.14 MG/200ML-% IV SOLN
60.0000 mg/h | INTRAVENOUS | Status: DC
  Administered 2017-09-19 (×2): 60 mg/h via INTRAVENOUS
  Filled 2017-09-19: qty 200

## 2017-09-19 MED ORDER — PENTAFLUOROPROP-TETRAFLUOROETH EX AERO: 1.0000 "application " | INHALATION_SPRAY | CUTANEOUS | Status: DC | PRN

## 2017-09-19 MED ORDER — SODIUM CHLORIDE 0.9 % IV SOLN
0.0000 ug/min | INTRAVENOUS | Status: DC
  Administered 2017-09-19: 120 ug/min via INTRAVENOUS
  Administered 2017-09-19: 30 ug/min via INTRAVENOUS
  Filled 2017-09-19: qty 1
  Filled 2017-09-19 (×2): qty 10

## 2017-09-19 MED ORDER — HEPARIN SODIUM (PORCINE) 1000 UNIT/ML DIALYSIS
1000.0000 [IU] | INTRAMUSCULAR | Status: DC | PRN
  Filled 2017-09-19: qty 1

## 2017-09-19 NOTE — Progress Notes (Signed)
Patient ID: Steven Forbes, male   DOB: 24-May-1926, 84 y.o.   MRN: [id number]  [facility] Progress Note    Subjective:   Agree with decision to withdrawal of care.   Objective:   BP (!) 86/47 (BP Location: Right Arm)   Pulse 84   Temp 98.2 F (36.8 C) (Axillary)   Resp 16   Ht '5\' 5"'$  (1.651 m)   Wt 63.4 kg (139 lb 12.4 oz)   SpO2 97%   BMI 23.26 kg/m   Intake/Output: I/O last 3 completed shifts: In: 2155.2 [I.V.:2035.2; NG/GT:70; IV Piggyback:50] Out: 710 [Other:710]   Intake/Output this shift:  Total I/O In: 1341 [I.V.:1121; NG/GT:120; IV Piggyback:100] Out: -  Weight change: -0.4 kg (-14.1 oz)  Physical Exam: [id number] CVS: tachy Resp: bibasilar crackles Abd: benign Ext: no edema  Labs: BMET Recent Labs  Lab 09/13/17 0900 09/14/17 0011 09/14/17 0326 09/15/17 0930 09/16/17 0321 09/17/17 0343 09/18/17 0433 09/19/17 0318  NA 132* 132* 132* 132* 133* 135 135 135  K 4.4 3.0* 3.0* 3.0* 3.3* 3.6 3.7 3.7  CL 101 94* 99* 94* 99* 98* 97* 97*  CO2 12* 24 21* '23 23 25 24 23  '$ GLUCOSE 104* 87 110* 109* 192* 105* 123* 147*  BUN '9 14 12 16 7 '$ 5* 9 7  CREATININE 2.25* 2.79* 2.44* 2.89* 1.82* 1.47* 2.25* 1.92*  ALBUMIN 1.2* 1.0*  --  1.0*  --   --   --   --   CALCIUM 6.6* 6.5* 6.2* 6.2* 6.7* 6.9* 7.1* 7.0*  PHOS  --  3.6  --  3.8 2.8  --   --   --    CBC Recent Labs  Lab 09/16/17 0321 09/17/17 0343 09/18/17 0433 09/19/17 0318  WBC 21.6* 20.3* 25.8* 29.8*  NEUTROABS  --   --   --  27.4*  HGB 7.7* 7.3* 7.6* 8.3*  HCT 22.7* 22.0* 22.6* 25.3*  MCV 87.6 89.1 89.3 91.3  PLT 121* 84* 122* 102*    '@IMGRELPRIORS'$ @ Medications:    . chlorhexidine gluconate (MEDLINE KIT)  15 mL Mouth Rinse BID   Dialysis:[facility] MWF 4h 51.5kg 3K/2.25 bath Hep 2500 R IJ cath 180NRe 400/auto 1.5 UFP 4 -Mircera 50 mcg IV q 2 weeks (last dose 08/31/17 Last HGB 9.0 09/06/17)  Assessment/ Plan:   1. Sepsis- from gangrenous right leg s/p AKA  09/05/2017 2. Shock and ongoing hypotension- possibly cardiogenic vs aspiration pneumonia 3. ESRD- s/p HD today but only able to UF 700cc due to hypotension 4. Anemia: ESA 5. CKD-MBD:  6. Nutrition:  7. Disposition- poor prognosis and has been having failure to thrive for the last year with progressive decline in mental and physical functional status.  Agree with Palliative care consult and family decision to transition to comfort care today.  Donetta Potts, MD [place] Pager [phone] 09/19/2017, 1:12 PM

## 2017-09-19 NOTE — Progress Notes (Signed)
Patient ID: Steven Forbes, male   DOB: Aug 02, 1926, 84 y.o.   MRN: [id number]  This NP visited patient at the bedside as a follow up for palliative  medicine needs and emotional support.  Family including wife and daughters have gathered to be together as we de-escalate care as planned yesterday.  They find comfort in the fact that Steven Forbes has lived in 2019 and that they were with him last night at midnight.  Clarification with family and nursing at bedside process of weaning patient off of blood pressure stabilizing medications, discontinue lab sticks, discontinue OG tube, and the utilization of low-dose opioids for comfort specific to pain and dyspnea.  Prognosis is likely hours to days  All family members expressed an understanding that the patient is approaching end of life, they hope for comfort.  Questions and concerns addressed  PMT will continue to support holistically and family is encouraged to call with questions or concerns.  Discussed with bedside RN and Dr Marval Regal   Time in 1200         Time out 1245       Total time spent on the unit was 45  minutes  Greater than 50% of the time was spent in counseling and coordination of care  Steven Lessen NP  Palliative Medicine Team Team Phone # [phone] Pager [phone]

## 2017-09-19 NOTE — Progress Notes (Signed)
PULMONARY / CRITICAL CARE MEDICINE   Name: Steven Forbes MRN: [id number] DOB: Aug 22, 1926    ADMISSION DATE:  09/04/2017 CONSULTATION DATE:  08/31/2017  REFERRING MD:  Dr. Ericka Pontiff   CHIEF COMPLAINT:  AMS   HISTORY OF PRESENT ILLNESS:   84 year old male with extensive PMH including dementia, DM, ESRD on HD M/W/F, GERD, Anemia, HLD, HTN, Aortic Stenosis, right transmetatarsal amputation, and known gangrenous right leg  Presents to ED from home with progressive lethargy. Per family patient was scheduled January 2 for AKA of right leg. Presented hypotensive, LA 7.49, given 2 L Bolus, Vancomycin/Zosyn and started on Levophed Gtt. PCCM called for admission.    Orthopedics was consulted. Agree with surgery 12/24. However, have discussed with family high risk due to multiple co-morbidites and current state of health.  Pt continued to decline , became levophed dependent and unable to tolerate dialysis, life support would not provide any further medical  Benefit. Family have agreed to with drawl of care 09/19/2017.Palliative care is involved and comfort measures will be initiated.    SUBJECTIVE:  Unresponsive on pressors and Amiodarone , family at bedside for planned comfort care.  VITAL SIGNS: BP (!) 91/44   Pulse 89   Temp 98.2 F (36.8 C) (Axillary)   Resp (!) 22   Ht 5\' 5"  (1.651 m)   Wt 139 lb 12.4 oz (63.4 kg)   SpO2 95%   BMI 23.26 kg/m   HEMODYNAMICS:    VENTILATOR SETTINGS:    INTAKE / OUTPUT:  Intake/Output Summary (Last 24 hours) at 09/19/2017 1136 Last data filed at 09/19/2017 1013 Gross per 24 hour  Intake 3118.84 ml  Output -  Net 3118.84 ml   PHYSICAL EXAMINATION:  General:  Sleepy, arouses to stimulation, elderly male supine in bed on Nasal oxygen HENT: NCAT OP clear PULM: crackles bases, respirations   regular and unlabored  CV: S1, S2, Atrial fibrillation, PVC's per tele GI: BS+, soft, nontender MSK: s/p R BKA Neuro: arouses to call of name, MAE, follows a  few simple commands   LABS:  BMET Recent Labs  Lab 09/17/17 0343 09/18/17 0433 09/19/17 0318  NA 135 135 135  K 3.6 3.7 3.7  CL 98* 97* 97*  CO2 25 24 23   BUN 5* 9 7  CREATININE 1.47* 2.25* 1.92*  GLUCOSE 105* 123* 147*   Electrolytes Recent Labs  Lab 09/14/17 0011  09/15/17 0930 09/16/17 0321 09/17/17 0343 09/18/17 0433 09/19/17 0318  CALCIUM 6.5*   < > 6.2* 6.7* 6.9* 7.1* 7.0*  MG  --   --   --  1.5*  --   --  1.6*  PHOS 3.6  --  3.8 2.8  --   --   --    < > = values in this interval not displayed.   CBC Recent Labs  Lab 09/17/17 0343 09/18/17 0433 09/19/17 0318  WBC 20.3* 25.8* 29.8*  HGB 7.3* 7.6* 8.3*  HCT 22.0* 22.6* 25.3*  PLT 84* 122* 102*   Coag's No results for input(s): APTT, INR in the last 168 hours.  Sepsis Markers Recent Labs  Lab 09/13/17 0500 09/18/17 1030 09/18/17 1042 09/19/17 0318  LATICACIDVEN  --  1.9  --   --   PROCALCITON 6.07  --  2.96 3.19   ABG Recent Labs  Lab 09/13/17 1138  PHART 7.384  PCO2ART 21.3*  PO2ART 86.5   Liver Enzymes Recent Labs  Lab 09/13/17 0900 09/14/17 0011 09/15/17 0930  AST 87*  --   --  ALT 33  --   --   ALKPHOS 129*  --   --   BILITOT 1.6*  --   --   ALBUMIN 1.2* 1.0* 1.0*   Cardiac Enzymes No results for input(s): TROPONINI, PROBNP in the last 168 hours. Glucose Recent Labs  Lab 09/18/17 1528 09/18/17 1957 09/18/17 2100 09/19/17 0012 09/19/17 0330 09/19/17 0849  GLUCAP 135* 175* 141* 127* 137* 96   Imaging No results found.I have reviewed the images personally  STUDIES:   CULTURES: Blood 12/23 >> Neg  ANTIBIOTICS: Vancomycin 12/23 >> Zosyn 12/23 >>   SIGNIFICANT EVENTS: 12/23 > Presents to ED  12/24 > Right Leg amputation  LINES/TUBES: ETT 12/24 > 12/26 Lt CVL IJ 12/24 > Lt femoral a line 12/24 >12/27  Resolved medical issues: Ventilator dependence  DISCUSSION: 84 year old male with known gangrenous right leg, seen by Dr. Sharol Given in office, planned above  knee amputation January 2. Presents to ED septic  Underwent rt leg amputation 12/24; acid-base continues to worsen, he did not tolerate dialysis, remains in refractory shock/multiple organ failure.  He currently is awake I think he could protect his airway and if his minute ventilation is adequate this may be the only time we can extubate him and allow him to interact with his family.  I do not think he is going to survive this hospitalization, certainly he will not if he is no longer a candidate for dialysis. Plan is for comfort care 09/19/2017.  ASSESSMENT / PLAN:  CV Septic Shock in setting of gangrenous right leg status post amputation 12/24 > improved but now with persistent hypotension: ddx septic shock vs cardiogenic shock Demand Ischemia Tn1 0.36 Aortic Stenosis (EF on 03/17/17 50-55)  > appears to have cardiogenic shock based on exam H/O HLD, HTN,  PVD S/P transmetatarsal amputation of right foot   Plan D?C meds Wean and DC levo    Renal ESRD, HD M/W/F  Hypokalemia Acid base imbalance worse, did not tolerate dialysis 12/28.   Without dialysis he will not survive this illness Acid-base improved with bicarbonate infusion Volume overload 12/29>> 1.5 L off with HD For HD again 12/31  Plan No further HD>> for withdrawal 1/1 No further labs   GO GERD Protein Calorie Malnutrition Mental status barrier to aspiration risk (and NG tube)  Plan NPO for now Comfort Care and withdrawal 09/19/2017  Heme Anemia of Chronic disease    Plan Monitor for bleeding Comfort care  ID: Aspiration pneumonia P Comfort care  With palliation assistance  Pulm: A: bilateral pulmonary infiltrates, likely pulm edmea and aspiration pneumonia P: Aspiration precautions NPO D/c  antibiotics   Endo DM    Hypoglycemia  Plan Full comfort care  Neuro: Metabolic Encephalopathy  H/O Dementia  Confused at baseline P:   Comfort care Morphine per Palliation   FAMILY: Full DNR per  conversation with family; will revisit this today with entire family because his progress has been slow and his overall condition is critical; plan made to withdrawal care 09/19/2017  DVT prophylaxis: Armstrong heparin SUP: ppi   Diet: NPO  Activity: bedrest Disposition : ICU/ DNR  Family at bedside awaiting palliation RN , and withdrawal of care once adequate comfort measures have been implemented   Magdalen Spatz, AGACNP-BC [facility] Pager: [phone] 09/19/2017 11:36

## 2017-09-19 NOTE — Congregational Nurse Program (Signed)
Congregational Nurse Program Note  Date of Encounter: 09/17/2017  Past Medical History: Past Medical History:  Diagnosis Date  . Anemia   . Asthma   . Dementia   . Diabetes mellitus    type 2  . ESRD (end stage renal disease) ([place])    Dialysis on MONDAY, [place] and FRIDAYS  . Flu 08/2014  . GERD (gastroesophageal reflux disease)   . History of blood transfusion   . HOH (hard of hearing)   . Hx of echocardiogram    a.  Echocardiogram (09/10/2013): EF 50-55%, normal wall motion, grade 2 diastolic dysfunction, mild aortic stenosis, trivial AI, MAC, moderate MR, mild LAE, moderate to severe TR, moderately increased PASP.  [name] Hx of non-ST elevation myocardial infarction (NSTEMI)    a. in setting of influenza, volume overload 08/2013 => Lexiscan Myoview (09/11/2013): Subtle ischemia in the anteroseptal wall, EF 44%. => med Rx recommended  . Hyperlipidemia   . Hypertension   . Shortness of breath    with exertion    Encounter Details: CNP Questionnaire - 09/19/17 1337      Questionnaire   Patient Status  Not Applicable    Race  Black or African American    Location Patient Served At  [employer]    Uninsured  Not Applicable    Food  No food insecurities    Housing/Utilities  Yes, have permanent housing    Transportation  No transportation needs    Interpersonal Safety  Yes, feel physically and emotionally safe where you currently live    Medication  No medication insecurities    Medical Provider  Yes    Referrals  Not Applicable    ED Visit Averted  Not Applicable    Life-Saving Intervention Made  Not Applicable      Social Visit to support family. [employer] [employer] Elye Harmsen RN BSN CN [place] [phone]

## 2017-09-19 NOTE — Progress Notes (Signed)
[facility] Progress Note Patient Name: Steven Forbes DOB: 05-11-1926 MRN: [id number]   Date of Service  09/19/2017  HPI/Events of Note  AFIB with RVR - HR = 163 and BP = 77/53. Currently on a Norepinephrine IV infusion.   eICU Interventions  Will order: 1. Amiodarone IV load and infusion.  2. Send AM BMP and Mg++ level STAT.  3. Phenylephrine IV infusion. Titrate to MAP > 65.  4. Wean Norepinephrine IV infusion off as tolerated once Phenylephrine IV infusion is up and running.      Intervention Category Major Interventions: Arrhythmia - evaluation and management  Garnett Rekowski [place] 09/19/2017, 3:18 AM

## 2017-09-19 DEATH — deceased

## 2017-09-22 ENCOUNTER — Telehealth: Payer: Self-pay

## 2017-09-22 NOTE — Telephone Encounter (Signed)
On 09/22/17 I received a d/c from [place] (original). The d/c is for burial. The patient is a patient of Doctor McQuaid. The d/c will be taken to [facility] for signature.  On 09/25/17 I received the d/c back from [place]. I got the d/c ready and called the funeral home to let them know the d/c is ready for pickup.

## 2017-09-28 ENCOUNTER — Ambulatory Visit (INDEPENDENT_AMBULATORY_CARE_PROVIDER_SITE_OTHER): Payer: Medicare Other | Admitting: Orthopedic Surgery

## 2017-10-05 ENCOUNTER — Ambulatory Visit (INDEPENDENT_AMBULATORY_CARE_PROVIDER_SITE_OTHER): Payer: Medicare Other | Admitting: Orthopedic Surgery

## 2017-10-20 NOTE — Death Summary Note (Signed)
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 year old male with baseline ESRD who had peripheral arterial disease and who had been experiencing declining health for a several years.  He was admitted for sepsis due to limb ischemia.  He underwent a high risk BKA and remained on mechanical ventilation for a few days but was able to be liberated from the vent.  However he developed renal failure and required HD in the ICU on vasopressors.  His shock worsened, and he had anasarca.  An echo showed an LVEF of 45%, lower than previously noted with mitral and aortic stenosis.  We discussed his poor prognosis with his family and they decided to withdraw care.      Pertinent Labs and Studies  Significant Diagnostic Studies Dg Chest Port 1 View  Result Date: 09/18/2017 CLINICAL DATA:  Respiratory failure. EXAM: PORTABLE CHEST 1 VIEW COMPARISON:  09/17/2017 FINDINGS: Left jugular catheter terminates near the cavoatrial junction. Right jugular dialysis catheter terminates over the right atrium. Enteric tube courses into the left upper abdomen with tip not  imaged. The cardiac silhouette remains enlarged. Patchy bilateral airspace opacities are noted in the mid and lower lungs bilaterally, greatest in the right lung base, and have mildly worsened. There is a small right pleural effusion which may have also mildly enlarged. No pneumothorax is identified. Aortic atherosclerosis is noted. IMPRESSION: Mildly worsening airspace disease bilaterally. Small right pleural effusion. Electronically Signed   By: Logan Bores M.D.   On: 09/18/2017 06:37   Dg Chest Port 1 View  Result Date: 09/17/2017 CLINICAL DATA:  Respiratory failure EXAM: PORTABLE CHEST 1 VIEW COMPARISON:  08/27/2017 FINDINGS: Cardiac shadow is mildly enlarged but stable. Left jugular line, nasogastric catheter and dialysis catheter are again seen and stable. The endotracheal tube has been removed in the interval. Patchy infiltrates are identified bilaterally increased from the prior exam. No bony abnormality is noted. IMPRESSION: Increasing patchy infiltrates bilaterally. Tubes and lines as described. Electronically Signed   By: Inez Catalina M.D.   On: 09/17/2017 07:32   Dg Chest Port 1 View  Result Date: 09/17/2017 CLINICAL DATA:  Endotracheal intubation.  Asthma. EXAM: PORTABLE CHEST 1 VIEW COMPARISON:  2017-09-18 FINDINGS: Interval placement of an endotracheal tube, the tip of which is 2.7 cm above the carina. Right IJ dialysis catheter tip: Right atrium. Left IJ central line tip: Lower SVC. Nasogastric tube tip: Stomach fundus with side port at the gastric cardia. Mild enlargement of the cardiopericardial silhouette with atherosclerotic calcification of the aortic arch. Fine interstitial accentuation in the lungs favoring the lung bases. Suspected mild scarring at the right lung  base. No appreciable pleural effusion. No overt edema. IMPRESSION: 1. Tubes and lines in satisfactorily position. Endotracheal tube tip is 2.7 cm above the carina. 2. Mild enlargement of the cardiopericardial silhouette.  Aortic Atherosclerosis (ICD10-I70.0). 3. Suspected mild scarring at the right lung base. 4. Fine chronic interstitial accentuation, stable. Electronically Signed   By: Van Clines M.D.   On: 08/28/2017 13:34   Dg Chest Port 1 View  Result Date: 08/31/2017 CLINICAL DATA:  84 year old male with altered mental status. EXAM: PORTABLE CHEST 1 VIEW COMPARISON:  Chest radiograph dated 03/21/2017 FINDINGS: Right IJ dialysis catheter with tip over the right atrium in similar position. There are minimal bibasilar atelectatic changes. No focal consolidation, pleural effusion, or pneumothorax. Stable cardiomegaly. Prominence of the central pulmonary arteries suggestive of underlying pulmonary hypertension. There is atherosclerotic calcification of the aortic arch. No acute osseous pathology. IMPRESSION: 1. Minimal bibasilar atelectasis.  No focal consolidation 2. Findings likely suggest a degree of pulmonary hypertension. Clinical correlation is recommended. 3. Stable cardiomegaly.  Dialysis catheter in similar position. Electronically Signed   By: Anner Crete M.D.   On: 08/28/2017 18:14   Dg Abd Portable 1v  Result Date: 09/03/2017 CLINICAL DATA:  Evaluate OG tube placement EXAM: PORTABLE ABDOMEN - 1 VIEW COMPARISON:  None. FINDINGS: The OG tube terminates in the stomach. Air-filled nondilated loops of bowel identified. No other acute abnormalities. IMPRESSION: The OG tube is in good position. Electronically Signed   By: Dorise Bullion III M.D   On: 08/25/2017 13:31   Dg Swallowing Func-speech Pathology  Result Date: 09/17/2017 Objective Swallowing Evaluation: Type of Study: MBS-Modified Barium Swallow Study  Patient Details Name: Steven Forbes MRN: [id number] Date of Birth: 01-23-1926 Today's Date: 09/17/2017 Time: SLP Start Time (ACUTE ONLY): 8657 -SLP Stop Time (ACUTE ONLY): 8469 SLP Time Calculation (min) (ACUTE ONLY): 33 min Past Medical History: Past Medical History: Diagnosis Date . Anemia  .  Asthma  . Dementia  . Diabetes mellitus   type 2 . ESRD (end stage renal disease) ([place])   Dialysis on MONDAY, [place] and FRIDAYS . Flu 08/2014 . GERD (gastroesophageal reflux disease)  . History of blood transfusion  . HOH (hard of hearing)  . Hx of echocardiogram   a.  Echocardiogram (09/10/2013): EF 50-55%, normal wall motion, grade 2 diastolic dysfunction, mild aortic stenosis, trivial AI, MAC, moderate MR, mild LAE, moderate to severe TR, moderately increased PASP. [name] Hx of non-ST elevation myocardial infarction (NSTEMI)   a. in setting of influenza, volume overload 08/2013 => Lexiscan Myoview (09/11/2013): Subtle ischemia in the anteroseptal wall, EF 44%. => med Rx recommended . Hyperlipidemia  . Hypertension  . Shortness of breath   with exertion Past Surgical History: Past Surgical History: Procedure Laterality Date . AMPUTATION Right 09/02/2017  Procedure: AMPUTATION ABOVE KNEE;  Surgeon: Mcarthur Rossetti, MD;  Location: [place];  Service: Orthopedics;  Laterality: Right; . ANTERIOR APPROACH HEMI HIP ARTHROPLASTY Left 03/18/2017  Procedure: ANTERIOR APPROACH HEMI HIP ARTHROPLASTY LEFT;  Surgeon: Leandrew Koyanagi, MD;  Location: [place];  Service: Orthopedics;  Laterality: Left; . AV FISTULA PLACEMENT  2008  Left Upper Arm . COLONOSCOPY   . EYE SURGERY Bilateral   cataract surgery . LAPAROSCOPIC GASTROTOMY W/ REPAIR OF ULCER   . REVISON OF ARTERIOVENOUS FISTULA Left 11/21/2013  Procedure: REVISION OF ARTERIOVENOUS FISTULA;  Surgeon: Angelia Mould, MD;  Location: [place];  Service: Vascular;  Laterality: Left; . REVISON OF ARTERIOVENOUS FISTULA Left 02/18/2016  Procedure: excision of ulcerated skin  over left brachio-cephalic AV fistula x 2;  Surgeon: [name] Misty, MD;  Location: [place];  Service: Vascular;  Laterality: Left; . TOE AMPUTATION Right   all toes HPI: 84 year old male with extensive PMH including dementia, DM, ESRD on HD M/W/F, GERD, Anemia, HLD, HTN, Aortic Stenosis, right transmetatarsal  amputation, and known gangrenous right leg. s/p right leg amputation 12/24, intubated and extubated 12/26. Evaluated by SLP 01/2017 and noted to have a primarily cognitive based dysphagia with recommendations for dysphagia 1 with thin liquid.  No Data Recorded Assessment / Plan / Recommendation CHL IP CLINICAL IMPRESSIONS 09/17/2017 Clinical Impression Patient presents with a severe oropharyngeal dysphagia with cognitive and mechanical deficits. Cognitively, patient with extremely poor sustained attention to to task and poor awareness resulting in severe oral holding, prolonged and often absent oral transit of bolus, speaking with bolus in oral cavity with partial bolus eventually spilling to the vallecula prior to intiating a swallow. Pharyngeally, patient with muscle weakness resulting in severe residuals post swallow with eventual penetration to the vocal cords without response. Unable to visualize below the cords during today's study despite multiple attempts at repositioning but strongly suspect aspiration. At this time, all po consistencies hold high risk of aspiration as cognitively, patient unable to follow directions for attempts at improved airway protection. Note that NG tube remained in place for todays study as RN concerned that patient would not allow replacement if taken out. Do not feel that swallowing function would be significantly improved with NG tuibe removal based on todays study. Recommend palliative care re-visit with patient and family to assist in decision making should family wish to consider po feeds. May consider dysphagia 1, thin for comfort with known risk of aspiration. SLP will f/u for education. Family not present after study today.  SLP Visit Diagnosis Dysphagia, oropharyngeal phase (R13.12) Attention and concentration deficit following -- Frontal lobe and executive function deficit following -- Impact on safety and function Severe aspiration risk   CHL IP TREATMENT RECOMMENDATION  09/17/2017 Treatment Recommendations Therapy as outlined in treatment plan below   Prognosis 09/17/2017 Prognosis for Safe Diet Advancement Guarded Barriers to Reach Goals Severity of deficits;Cognitive deficits Barriers/Prognosis Comment -- CHL IP DIET RECOMMENDATION 09/17/2017 SLP Diet Recommendations NPO Liquid Administration via -- Medication Administration Via alternative means Compensations -- Postural Changes --   CHL IP OTHER RECOMMENDATIONS 09/17/2017 Recommended Consults -- Oral Care Recommendations Oral care QID Other Recommendations --   CHL IP FOLLOW UP RECOMMENDATIONS 09/17/2017 Follow up Recommendations None   CHL IP FREQUENCY AND DURATION 09/17/2017 Speech Therapy Frequency (ACUTE ONLY) min 2x/week Treatment Duration 1 week      CHL IP ORAL PHASE 09/17/2017 Oral Phase Impaired Oral - Pudding Teaspoon -- Oral - Pudding Cup -- Oral - Honey Teaspoon -- Oral - Honey Cup -- Oral - Nectar Teaspoon Reduced posterior propulsion;Weak lingual manipulation;Holding of bolus;Lingual/palatal residue;Piecemeal swallowing;Delayed oral transit;Decreased bolus cohesion;Left anterior bolus loss;Right anterior bolus loss;Impaired mastication Oral - Nectar Cup Reduced posterior propulsion;Weak lingual manipulation;Holding of bolus;Lingual/palatal residue;Piecemeal swallowing;Delayed oral transit;Decreased bolus cohesion;Left anterior bolus loss;Right anterior bolus loss;Impaired mastication Oral - Nectar Straw -- Oral - Thin Teaspoon Reduced posterior propulsion;Weak lingual manipulation;Holding of bolus;Lingual/palatal residue;Piecemeal swallowing;Delayed oral transit;Decreased bolus cohesion;Left anterior bolus loss;Right anterior bolus loss;Impaired mastication Oral - Thin Cup Reduced posterior propulsion;Weak lingual manipulation;Holding of bolus;Lingual/palatal residue;Piecemeal swallowing;Delayed oral transit;Decreased bolus cohesion;Left anterior bolus loss;Right anterior bolus loss;Impaired mastication Oral -  Thin Straw -- Oral - Puree Reduced posterior propulsion;Weak lingual manipulation;Holding of bolus;Lingual/palatal  residue;Piecemeal swallowing;Delayed oral transit;Decreased bolus cohesion;Impaired mastication Oral - Mech Soft -- Oral - Regular -- Oral - Multi-Consistency -- Oral - Pill -- Oral Phase - Comment --  CHL IP PHARYNGEAL PHASE 09/17/2017 Pharyngeal Phase Impaired Pharyngeal- Pudding Teaspoon -- Pharyngeal -- Pharyngeal- Pudding Cup -- Pharyngeal -- Pharyngeal- Honey Teaspoon -- Pharyngeal -- Pharyngeal- Honey Cup -- Pharyngeal -- Pharyngeal- Nectar Teaspoon Delayed swallow initiation-vallecula;Reduced epiglottic inversion;Reduced pharyngeal peristalsis;Reduced anterior laryngeal mobility;Reduced laryngeal elevation;Reduced airway/laryngeal closure;Reduced tongue base retraction;Penetration/Aspiration during swallow;Penetration/Apiration after swallow Pharyngeal Material enters airway, CONTACTS cords and not ejected out Pharyngeal- Nectar Cup Delayed swallow initiation-vallecula;Reduced epiglottic inversion;Reduced pharyngeal peristalsis;Reduced anterior laryngeal mobility;Reduced laryngeal elevation;Reduced airway/laryngeal closure;Reduced tongue base retraction;Penetration/Aspiration during swallow;Penetration/Apiration after swallow Pharyngeal Material enters airway, CONTACTS cords and not ejected out Pharyngeal- Nectar Straw -- Pharyngeal -- Pharyngeal- Thin Teaspoon Delayed swallow initiation-vallecula;Reduced epiglottic inversion;Reduced pharyngeal peristalsis;Reduced anterior laryngeal mobility;Reduced laryngeal elevation;Reduced airway/laryngeal closure;Reduced tongue base retraction;Penetration/Aspiration during swallow;Penetration/Apiration after swallow Pharyngeal Material enters airway, CONTACTS cords and not ejected out Pharyngeal- Thin Cup Delayed swallow initiation-vallecula;Reduced epiglottic inversion;Reduced pharyngeal peristalsis;Reduced anterior laryngeal mobility;Reduced laryngeal  elevation;Reduced airway/laryngeal closure;Reduced tongue base retraction;Penetration/Aspiration during swallow;Penetration/Apiration after swallow Pharyngeal Material enters airway, CONTACTS cords and not ejected out Pharyngeal- Thin Straw -- Pharyngeal -- Pharyngeal- Puree Delayed swallow initiation-vallecula;Reduced epiglottic inversion;Reduced pharyngeal peristalsis;Reduced anterior laryngeal mobility;Reduced laryngeal elevation;Reduced airway/laryngeal closure;Reduced tongue base retraction Pharyngeal -- Pharyngeal- Mechanical Soft -- Pharyngeal -- Pharyngeal- Regular -- Pharyngeal -- Pharyngeal- Multi-consistency -- Pharyngeal -- Pharyngeal- Pill -- Pharyngeal -- Pharyngeal Comment --  Gabriel Rainwater MA, CCC-SLP [phone] [place] Leah Meryl 09/17/2017, 4:29 PM               Microbiology No results found for this or any previous visit (from the past 240 hour(s)).  Lab Basic Metabolic Panel: Recent Labs  Lab 09/17/17 0343 09/18/17 0433 09/19/17 0318  NA 135 135 135  K 3.6 3.7 3.7  CL 98* 97* 97*  CO2 25 24 23   GLUCOSE 105* 123* 147*  BUN 5* 9 7  CREATININE 1.47* 2.25* 1.92*  CALCIUM 6.9* 7.1* 7.0*  MG  --   --  1.6*   Liver Function Tests: No results for input(s): AST, ALT, ALKPHOS, BILITOT, PROT, ALBUMIN in the last 168 hours. No results for input(s): LIPASE, AMYLASE in the last 168 hours. No results for input(s): AMMONIA in the last 168 hours. CBC: Recent Labs  Lab 09/17/17 0343 09/18/17 0433 09/19/17 0318  WBC 20.3* 25.8* 29.8*  NEUTROABS  --   --  27.4*  HGB 7.3* 7.6* 8.3*  HCT 22.0* 22.6* 25.3*  MCV 89.1 89.3 91.3  PLT 84* 122* 102*   Cardiac Enzymes: No results for input(s): CKTOTAL, CKMB, CKMBINDEX, TROPONINI in the last 168 hours. Sepsis Labs: Recent Labs  Lab 09/17/17 0343 09/18/17 0433 09/18/17 1030 09/18/17 1042 09/19/17 0318  PROCALCITON  --   --   --  2.96 3.19  WBC 20.3* 25.8*  --   --  29.8*  LATICACIDVEN  --   --  1.9  --   --      Procedures/Operations   CULTURES: Blood 12/23 >> Neg  ANTIBIOTICS: Vancomycin 12/23 >> Zosyn 12/23 >>   SIGNIFICANT EVENTS: 12/23 > Presents to ED  12/24 > Right Leg amputation  LINES/TUBES: ETT 12/24 > 12/26 Lt CVL IJ 12/24 > Lt femoral a line 12/24 >12/27     Simonne Maffucci 09/23/2017, 6:24 AM

## 2017-10-20 NOTE — Progress Notes (Signed)
Called to room by family member, pt noted to be not breathing, no pulse, pt is comfort care. Called another nurse, Gypsy Lore., to verify, pronounced time of death is 1:00am, family at bedside, called Dr. Ashby Dawes to inform, [place] donors called, pt not suitable for organ donation. Awaiting other family members to come

## 2017-10-20 DEATH — deceased
